# Patient Record
Sex: Male | Born: 1950 | Race: White | Hispanic: No | Marital: Single | Smoking: Current every day smoker
Health system: Southern US, Community
[De-identification: ages and names within clinical notes are randomized; demographics above are authoritative.]

## PROBLEM LIST (undated history)

## (undated) DIAGNOSIS — K219 Gastro-esophageal reflux disease without esophagitis: Secondary | ICD-10-CM

## (undated) DIAGNOSIS — S329XXA Fracture of unspecified parts of lumbosacral spine and pelvis, initial encounter for closed fracture: Secondary | ICD-10-CM

## (undated) DIAGNOSIS — F419 Anxiety disorder, unspecified: Secondary | ICD-10-CM

## (undated) DIAGNOSIS — F209 Schizophrenia, unspecified: Secondary | ICD-10-CM

## (undated) DIAGNOSIS — R338 Other retention of urine: Secondary | ICD-10-CM

## (undated) DIAGNOSIS — S0990XA Unspecified injury of head, initial encounter: Secondary | ICD-10-CM

## (undated) DIAGNOSIS — E785 Hyperlipidemia, unspecified: Secondary | ICD-10-CM

## (undated) DIAGNOSIS — F259 Schizoaffective disorder, unspecified: Secondary | ICD-10-CM

## (undated) DIAGNOSIS — T884XXA Failed or difficult intubation, initial encounter: Secondary | ICD-10-CM

## (undated) DIAGNOSIS — D649 Anemia, unspecified: Secondary | ICD-10-CM

## (undated) DIAGNOSIS — N400 Enlarged prostate without lower urinary tract symptoms: Secondary | ICD-10-CM

## (undated) DIAGNOSIS — I10 Essential (primary) hypertension: Secondary | ICD-10-CM

## (undated) DIAGNOSIS — F319 Bipolar disorder, unspecified: Secondary | ICD-10-CM

## (undated) DIAGNOSIS — T4145XA Adverse effect of unspecified anesthetic, initial encounter: Secondary | ICD-10-CM

## (undated) HISTORY — DX: Schizoaffective disorder, unspecified: F25.9

## (undated) HISTORY — DX: Anxiety disorder, unspecified: F41.9

## (undated) HISTORY — PX: SKIN GRAFT: SHX250

## (undated) HISTORY — DX: Bipolar disorder, unspecified: F31.9

---

## 1973-08-03 DIAGNOSIS — S069X9A Unspecified intracranial injury with loss of consciousness of unspecified duration, initial encounter: Secondary | ICD-10-CM

## 1973-08-03 DIAGNOSIS — S0990XA Unspecified injury of head, initial encounter: Secondary | ICD-10-CM

## 1973-08-03 DIAGNOSIS — S069XAA Unspecified intracranial injury with loss of consciousness status unknown, initial encounter: Secondary | ICD-10-CM

## 1973-08-03 HISTORY — PX: MANDIBLE FRACTURE SURGERY: SHX706

## 1973-08-03 HISTORY — PX: COSMETIC SURGERY: SHX468

## 1973-08-03 HISTORY — DX: Unspecified intracranial injury with loss of consciousness status unknown, initial encounter: S06.9XAA

## 1973-08-03 HISTORY — DX: Unspecified injury of head, initial encounter: S09.90XA

## 1973-08-03 HISTORY — DX: Unspecified intracranial injury with loss of consciousness of unspecified duration, initial encounter: S06.9X9A

## 1997-11-11 ENCOUNTER — Inpatient Hospital Stay (HOSPITAL_COMMUNITY): Admission: AD | Admit: 1997-11-11 | Discharge: 1997-11-14 | Payer: Self-pay | Admitting: *Deleted

## 1997-11-15 ENCOUNTER — Encounter (HOSPITAL_COMMUNITY): Admission: RE | Admit: 1997-11-15 | Discharge: 1998-02-13 | Payer: Self-pay

## 2001-03-30 ENCOUNTER — Encounter: Payer: Self-pay | Admitting: Cardiology

## 2001-03-30 ENCOUNTER — Encounter: Admission: RE | Admit: 2001-03-30 | Discharge: 2001-03-30 | Payer: Self-pay | Admitting: Cardiology

## 2006-11-21 ENCOUNTER — Inpatient Hospital Stay (HOSPITAL_COMMUNITY): Admission: AD | Admit: 2006-11-21 | Discharge: 2006-11-26 | Payer: Self-pay | Admitting: *Deleted

## 2006-11-21 ENCOUNTER — Ambulatory Visit: Payer: Self-pay | Admitting: *Deleted

## 2006-12-05 ENCOUNTER — Emergency Department (HOSPITAL_COMMUNITY): Admission: EM | Admit: 2006-12-05 | Discharge: 2006-12-05 | Payer: Self-pay | Admitting: Emergency Medicine

## 2009-03-30 ENCOUNTER — Emergency Department (HOSPITAL_COMMUNITY): Admission: EM | Admit: 2009-03-30 | Discharge: 2009-03-30 | Payer: Self-pay | Admitting: Emergency Medicine

## 2010-11-08 LAB — DIFFERENTIAL
Basophils Absolute: 0.2 10*3/uL — ABNORMAL HIGH (ref 0.0–0.1)
Basophils Relative: 2 % — ABNORMAL HIGH (ref 0–1)
Eosinophils Absolute: 0.2 10*3/uL (ref 0.0–0.7)
Eosinophils Relative: 2 % (ref 0–5)
Lymphocytes Relative: 29 % (ref 12–46)
Lymphs Abs: 2.8 10*3/uL (ref 0.7–4.0)
Monocytes Absolute: 0.8 10*3/uL (ref 0.1–1.0)
Monocytes Relative: 8 % (ref 3–12)
Neutro Abs: 5.6 10*3/uL (ref 1.7–7.7)
Neutrophils Relative %: 59 % (ref 43–77)

## 2010-11-08 LAB — RAPID URINE DRUG SCREEN, HOSP PERFORMED
Amphetamines: NOT DETECTED
Barbiturates: NOT DETECTED
Benzodiazepines: NOT DETECTED
Cocaine: NOT DETECTED
Opiates: NOT DETECTED
Tetrahydrocannabinol: NOT DETECTED

## 2010-11-08 LAB — CBC
HCT: 52.3 % — ABNORMAL HIGH (ref 39.0–52.0)
Hemoglobin: 17.7 g/dL — ABNORMAL HIGH (ref 13.0–17.0)
MCHC: 33.9 g/dL (ref 30.0–36.0)
MCV: 95.9 fL (ref 78.0–100.0)
Platelets: 222 10*3/uL (ref 150–400)
RBC: 5.45 MIL/uL (ref 4.22–5.81)
RDW: 14.1 % (ref 11.5–15.5)
WBC: 9.6 10*3/uL (ref 4.0–10.5)

## 2010-11-08 LAB — BASIC METABOLIC PANEL
BUN: 11 mg/dL (ref 6–23)
CO2: 27 mEq/L (ref 19–32)
Calcium: 9.6 mg/dL (ref 8.4–10.5)
Chloride: 104 mEq/L (ref 96–112)
Creatinine, Ser: 0.76 mg/dL (ref 0.4–1.5)
GFR calc Af Amer: >60 mL/min (ref >60)
GFR calc non Af Amer: >60 mL/min (ref >60)
Glucose, Bld: 96 mg/dL (ref 70–99)
Potassium: 3.5 mEq/L (ref 3.5–5.1)
Sodium: 139 mEq/L (ref 135–145)

## 2010-11-08 LAB — ETHANOL: Alcohol, Ethyl (B): <5 mg/dL (ref 0–10)

## 2010-12-19 NOTE — Discharge Summary (Signed)
[name], [name]               ACCOUNT NO.:  [id number]   MEDICAL RECORD NO.:  [id number]          PATIENT TYPE:  IPS   LOCATION:  0403                          FACILITY:  [facility]   PHYSICIAN:  Anselm Jungling, MD  DATE OF BIRTH:  [birth year]   DATE OF ADMISSION:  11/21/2006  DATE OF DISCHARGE:  11/26/2006                               DISCHARGE SUMMARY   IDENTIFYING DATA AND REASON FOR ADMISSION:  The patient is a 60 year old  unmarried male, who was admitted due to exacerbation of longstanding  schizophrenia.  He had previously been in outpatient care, but by his  own admission, had discontinued his psychotropic medications about a  month prior to admission.   The patient apparently began to decompensate with paranoid ideation,  that led to delusions about a neighbor wanting to harm him.  Because of  this, he abandoned his apartment and checked in at a hotel.  While at  the hotel, he felt physically ill and came to [facility] emergency  department.  There, his mental status abnormalities were noted, and  inpatient psychiatric treatment was arranged.  Please refer to the  admission note for further details pertaining to the symptoms,  circumstances and history that led to his hospitalization.  He was given  an initial Axis I diagnosis of schizophrenia, chronic paranoid type,  acute exacerbation.   MEDICAL AND LABORATORY:  The patient was medically and physically  cleared in the emergency department prior to transfer to the psychiatric  service, where he was once again assessed by the psychiatric nurse  practitioner.  There were no acute medical issues.   HOSPITAL COURSE:  The patient was admitted to the adult inpatient  psychiatric service.  He presented as a tall, slender, but normally  developed and healthy-appearing adult male, who was initially quite  irritable, demanding, and disagreed with his hospitalization.  He was  initially evaluated by Dr. Milford Cage.  The  undersigned assumed care  of the patient on the third hospital day.  By this time, the patient had  been refusing routine doses of Depakote and Seroquel, the medications  that had comprised his previous psychotropic regimen.   The patient, after some discussion, allowed me to contact his attorney,  who is his Chartered certified accountant, [name].  I spoke with [name], who  indicated that he had been trying to persuade the patient to accept  medical and/or psychiatric care for some time.  He expressed relief that  the patient was in a situation where he could get some care for his  underlying disturbance.   [name] apparently communicated to the patient his support of the  patient being in our facility and receiving treatment, with this, the  patient began to take regular doses of Seroquel and Depakote.  With  this, his irritability and agitation diminished quickly, and he became  quite cooperative.   Towards the latter part of his stay, and discussion with the  undersigned, the patient was able to acknowledge that his thinking had  become irrational and somewhat paranoid prior to admission, and that  this was why he had abandoned his apartment.  He agreed that he needed  to continue on medication.   On the sixth hospital day the patient appeared appropriate for  discharge.  He was reasonably well compensated, and was not having any  further acute symptoms of psychosis.  He was tolerating his medications  and agreed to continue them on an outpatient basis.   AFTERCARE:  The patient was to follow up with an appointment with Dr.  Lang [name] on November 30, 2006 at the [facility].  The patient was  discharged to his attorney, [name], who was to work out a residential  arrangements for the patient, both short-term and longer term.   DISCHARGE MEDICATIONS:  Depakote ER 500 mg daily and Seroquel 100 mg  nightly.   DISCHARGE DIAGNOSES:  AXIS I:  Schizoaffective disorder, most recently   psychotic with paranoid features, resolving.  AXIS II:  Deferred.  AXIS III:  No acute or chronic illnesses.  AXIS IV:  Stressors severe.  AXIS V:  Global assessment of functioning on discharge 60.      Anselm Jungling, MD  Electronically Signed     SPB/MEDQ  D:  11/29/2006  T:  11/29/2006  Job:  [id number]

## 2013-08-25 ENCOUNTER — Other Ambulatory Visit: Payer: Self-pay

## 2013-08-25 ENCOUNTER — Encounter (HOSPITAL_COMMUNITY): Payer: Self-pay | Admitting: Emergency Medicine

## 2013-08-25 ENCOUNTER — Emergency Department (HOSPITAL_COMMUNITY)
Admission: EM | Admit: 2013-08-25 | Discharge: 2013-08-26 | Disposition: A | Discharge status: Home / Self Care | Payer: Medicare Other | Attending: Emergency Medicine | Admitting: Emergency Medicine

## 2013-08-25 DIAGNOSIS — R5383 Other fatigue: Secondary | ICD-10-CM

## 2013-08-25 DIAGNOSIS — R0602 Shortness of breath: Secondary | ICD-10-CM | POA: Insufficient documentation

## 2013-08-25 DIAGNOSIS — R079 Chest pain, unspecified: Secondary | ICD-10-CM

## 2013-08-25 DIAGNOSIS — F209 Schizophrenia, unspecified: Secondary | ICD-10-CM | POA: Insufficient documentation

## 2013-08-25 DIAGNOSIS — R0789 Other chest pain: Secondary | ICD-10-CM | POA: Insufficient documentation

## 2013-08-25 DIAGNOSIS — R5381 Other malaise: Secondary | ICD-10-CM | POA: Insufficient documentation

## 2013-08-25 HISTORY — DX: Schizophrenia, unspecified: F20.9

## 2013-08-25 LAB — CBC
HCT: 47.2 % (ref 39.0–52.0)
Hemoglobin: 16.3 g/dL (ref 13.0–17.0)
MCH: 32.1 pg (ref 26.0–34.0)
MCHC: 34.5 g/dL (ref 30.0–36.0)
MCV: 93.1 fL (ref 78.0–100.0)
Platelets: 243 10*3/uL (ref 150–400)
RBC: 5.07 MIL/uL (ref 4.22–5.81)
RDW: 14.3 % (ref 11.5–15.5)
WBC: 9.8 10*3/uL (ref 4.0–10.5)

## 2013-08-25 LAB — POCT I-STAT TROPONIN I: TROPONIN I, POC: 0 ng/mL (ref 0.00–0.08)

## 2013-08-25 NOTE — ED Notes (Addendum)
Pt. arrived with EMS from home reports chest discomfort " prickling / pressure"  " for 1 week , denies chest pain , no SOB , slight nausea.

## 2013-08-25 NOTE — ED Provider Notes (Signed)
CSN: [id number]     Arrival date & time 08/25/13  2318 History   First MD Initiated Contact with Patient 08/25/13 2343     Chief Complaint  Patient presents with  . Chest Pain   (Consider location/radiation/quality/duration/timing/severity/associated sxs/prior Treatment) HPI 63 yo schizoprhenic man with no PMH presents with complaints of feeling of "malaise", per the patient, in his head and chest.  He has had this problem for the past several days. He feels fine while sitting or laying  down. His sx seem to be present when he stands up.   He notes that he sometimes feels SOB with exertion and also at rest. However, he denies SOB at this time. His episodes of SOB seem to be self limited and last minutes. He has had two such episodes - both at night. He denies cough, fever. He is a 5cig/day smoker. Denies history of CAD or VTE.   Patient notes that he has been off all psychiatric meds for several months because he cannot afford $3 Medicaid copay.   However, he denies hallucinations, SI and HI>    Past Medical History  Diagnosis Date  . Schizophrenia    History reviewed. No pertinent past surgical history. No family history on file. History  Substance Use Topics  . Smoking status: Never Smoker   . Smokeless tobacco: Not on file  . Alcohol Use: No    Review of Systems Ten point review of symptoms performed and is negative with the exception of symptoms noted above.   Allergies  Review of patient's allergies indicates no known allergies.  Home Medications  No current outpatient prescriptions on file. BP 158/99  Pulse 93  Temp(Src) 97.7 F (36.5 C) (Oral)  Resp 18  SpO2 97% Physical Exam Gen: well developed and well nourished appearing, disheveled appearing Head: NCAT Eyes: PERL, EOMI Nose: no epistaixis or rhinorrhea Mouth/throat: mucosa is moist and pink Neck: supple, no stridor Lungs: CTA B, no wheezing, rhonchi or rales CV: RRR, no murmur, extremities appear well  perfused.  Abd: soft, notender, nondistended Back: no ttp, no cva ttp Skin: warm and dry Ext: normal to inspection, no dependent edema Neuro: CN ii-xii grossly intact, no focal deficits Psyche; normal affect,  calm and cooperative he does have appropriate insight.   ED Course  Procedures (including critical care time) Labs Review Results for orders placed during the hospital encounter of 08/25/13 (from the past 24 hour(s))  CBC     Status: None   Collection Time    08/25/13 11:30 PM      Result Value Range   WBC 9.8  4.0 - 10.5 K/uL   RBC 5.07  4.22 - 5.81 MIL/uL   Hemoglobin 16.3  13.0 - 17.0 g/dL   HCT 40.9  81.1 - 91.4 %   MCV 93.1  78.0 - 100.0 fL   MCH 32.1  26.0 - 34.0 pg   MCHC 34.5  30.0 - 36.0 g/dL   RDW 78.2  95.6 - 21.3 %   Platelets 243  150 - 400 K/uL  BASIC METABOLIC PANEL     Status: None   Collection Time    08/25/13 11:30 PM      Result Value Range   Sodium 141  137 - 147 mEq/L   Potassium 4.2  3.7 - 5.3 mEq/L   Chloride 100  96 - 112 mEq/L   CO2 27  19 - 32 mEq/L   Glucose, Bld 95  70 - 99 mg/dL  BUN 12  6 - 23 mg/dL   Creatinine, Ser 1.610.80  0.50 - 1.35 mg/dL   Calcium 9.7  8.4 - 09.610.5 mg/dL   GFR calc non Af Amer >90  >90 mL/min   GFR calc Af Amer >90  >90 mL/min  POCT I-STAT TROPONIN I     Status: None   Collection Time    08/25/13 11:40 PM      Result Value Range   Troponin i, poc 0.00  0.00 - 0.08 ng/mL   Comment 3            CXR: normal cardiac silloute, normal appearing mediastinum, no infiltrates, no acute process identified.   EKG: nsr, no acute ischemic changes, normal intervals, normal axis, normal qrs complex  MDM  DDX: ACS, pneumothorax, pneumonia, pericardial or pleural effusion, gastritis, GERD/PUD, musculoskeletal pain.   0050: Patient reassessed and is pain free. In light of 3 week history of sx, and atypical nature along with normal trop, EKG and CXR and benign exam, I feel the patient may be safely discharged with plan for  outpatient f/u. With regards to his mental health care, [facility] and I [facility] for re-eval.     Brandt LoosenJulie Escher Harr, MD 08/26/13 0100

## 2013-08-26 ENCOUNTER — Emergency Department (HOSPITAL_COMMUNITY): Payer: Medicare Other

## 2013-08-26 LAB — BASIC METABOLIC PANEL
BUN: 12 mg/dL (ref 6–23)
CO2: 27 meq/L (ref 19–32)
Calcium: 9.7 mg/dL (ref 8.4–10.5)
Chloride: 100 mEq/L (ref 96–112)
Creatinine, Ser: 0.8 mg/dL (ref 0.50–1.35)
GFR calc Af Amer: >90 mL/min (ref >90)
GFR calc non Af Amer: >90 mL/min (ref >90)
Glucose, Bld: 95 mg/dL (ref 70–99)
Potassium: 4.2 mEq/L (ref 3.7–5.3)
Sodium: 141 mEq/L (ref 137–147)

## 2013-08-26 NOTE — Discharge Instructions (Signed)
Chest Pain (Nonspecific) °It is often hard to give a specific diagnosis for the cause of chest pain. There is always a chance that your pain could be related to something serious, such as a heart attack or a blood clot in the lungs. You need to follow up with your caregiver for further evaluation. °CAUSES  °· Heartburn. °· Pneumonia or bronchitis. °· Anxiety or stress. °· Inflammation around your heart (pericarditis) or lung (pleuritis or pleurisy). °· A blood clot in the lung. °· A collapsed lung (pneumothorax). It can develop suddenly on its own (spontaneous pneumothorax) or from injury (trauma) to the chest. °· Shingles infection (herpes zoster virus). °The chest wall is composed of bones, muscles, and cartilage. Any of these can be the source of the pain. °· The bones can be bruised by injury. °· The muscles or cartilage can be strained by coughing or overwork. °· The cartilage can be affected by inflammation and become sore (costochondritis). °DIAGNOSIS  °Lab tests or other studies, such as X-rays, electrocardiography, stress testing, or cardiac imaging, may be needed to find the cause of your pain.  °TREATMENT  °· Treatment depends on what may be causing your chest pain. Treatment may include: °· Acid blockers for heartburn. °· Anti-inflammatory medicine. °· Pain medicine for inflammatory conditions. °· Antibiotics if an infection is present. °· You may be advised to change lifestyle habits. This includes stopping smoking and avoiding alcohol, caffeine, and chocolate. °· You may be advised to keep your head raised (elevated) when sleeping. This reduces the chance of acid going backward from your stomach into your esophagus. °· Most of the time, nonspecific chest pain will improve within 2 to 3 days with rest and mild pain medicine. °HOME CARE INSTRUCTIONS  °· If antibiotics were prescribed, take your antibiotics as directed. Finish them even if you start to feel better. °· For the next few days, avoid physical  activities that bring on chest pain. Continue physical activities as directed. °· Do not smoke. °· Avoid drinking alcohol. °· Only take over-the-counter or prescription medicine for pain, discomfort, or fever as directed by your caregiver. °· Follow your caregiver's suggestions for further testing if your chest pain does not go away. °· Keep any follow-up appointments you made. If you do not go to an appointment, you could develop lasting (chronic) problems with pain. If there is any problem keeping an appointment, you must call to reschedule. °SEEK MEDICAL CARE IF:  °· You think you are having problems from the medicine you are taking. Read your medicine instructions carefully. °· Your chest pain does not go away, even after treatment. °· You develop a rash with blisters on your chest. °SEEK IMMEDIATE MEDICAL CARE IF:  °· You have increased chest pain or pain that spreads to your arm, neck, jaw, back, or abdomen. °· You develop shortness of breath, an increasing cough, or you are coughing up blood. °· You have severe back or abdominal pain, feel nauseous, or vomit. °· You develop severe weakness, fainting, or chills. °· You have a fever. °THIS IS AN EMERGENCY. Do not wait to see if the pain will go away. Get medical help at once. Call your local emergency services (911 in [place] Do not drive yourself to the hospital. °MAKE SURE YOU:  °· Understand these instructions. °· Will watch your condition. °· Will get help right away if you are not doing well or get worse. °Document Released: 04/29/2005 Document Revised: 10/12/2011 Document Reviewed: 02/23/2008 °[employer]® Patient Information ©2014 [employer]   [employer].   PLEASE FOLLOW UP WITH YOUR MENTAL HEALTH PROVIDER AT [facility] ON Monday.  CALL FOR FIRST AVAILABLE APPT.

## 2013-08-29 ENCOUNTER — Emergency Department (HOSPITAL_COMMUNITY): Payer: Medicare Other

## 2013-08-29 ENCOUNTER — Inpatient Hospital Stay (HOSPITAL_COMMUNITY)
Admission: EM | Admit: 2013-08-29 | Discharge: 2013-09-05 | DRG: 958 | Disposition: A | Discharge status: Skilled Nursing Facility | Payer: Medicare Other | Attending: General Surgery | Admitting: General Surgery

## 2013-08-29 ENCOUNTER — Encounter (HOSPITAL_COMMUNITY): Payer: Self-pay | Admitting: Emergency Medicine

## 2013-08-29 DIAGNOSIS — S32302A Unspecified fracture of left ilium, initial encounter for closed fracture: Secondary | ICD-10-CM | POA: Diagnosis present

## 2013-08-29 DIAGNOSIS — F209 Schizophrenia, unspecified: Secondary | ICD-10-CM | POA: Diagnosis present

## 2013-08-29 DIAGNOSIS — S71109A Unspecified open wound, unspecified thigh, initial encounter: Secondary | ICD-10-CM | POA: Diagnosis present

## 2013-08-29 DIAGNOSIS — S32592A Other specified fracture of left pubis, initial encounter for closed fracture: Secondary | ICD-10-CM | POA: Diagnosis present

## 2013-08-29 DIAGNOSIS — S329XXA Fracture of unspecified parts of lumbosacral spine and pelvis, initial encounter for closed fracture: Secondary | ICD-10-CM | POA: Diagnosis present

## 2013-08-29 DIAGNOSIS — R338 Other retention of urine: Secondary | ICD-10-CM | POA: Diagnosis present

## 2013-08-29 DIAGNOSIS — S71009A Unspecified open wound, unspecified hip, initial encounter: Secondary | ICD-10-CM | POA: Diagnosis present

## 2013-08-29 DIAGNOSIS — S82409B Unspecified fracture of shaft of unspecified fibula, initial encounter for open fracture type I or II: Secondary | ICD-10-CM

## 2013-08-29 DIAGNOSIS — S0003XA Contusion of scalp, initial encounter: Secondary | ICD-10-CM | POA: Diagnosis present

## 2013-08-29 DIAGNOSIS — S1093XA Contusion of unspecified part of neck, initial encounter: Secondary | ICD-10-CM

## 2013-08-29 DIAGNOSIS — D62 Acute posthemorrhagic anemia: Secondary | ICD-10-CM | POA: Diagnosis present

## 2013-08-29 DIAGNOSIS — N289 Disorder of kidney and ureter, unspecified: Secondary | ICD-10-CM | POA: Diagnosis present

## 2013-08-29 DIAGNOSIS — T8859XA Other complications of anesthesia, initial encounter: Secondary | ICD-10-CM

## 2013-08-29 DIAGNOSIS — IMO0002 Reserved for concepts with insufficient information to code with codable children: Secondary | ICD-10-CM

## 2013-08-29 DIAGNOSIS — S0083XA Contusion of other part of head, initial encounter: Secondary | ICD-10-CM

## 2013-08-29 DIAGNOSIS — F1020 Alcohol dependence, uncomplicated: Secondary | ICD-10-CM | POA: Diagnosis present

## 2013-08-29 DIAGNOSIS — K5900 Constipation, unspecified: Secondary | ICD-10-CM | POA: Diagnosis not present

## 2013-08-29 DIAGNOSIS — R339 Retention of urine, unspecified: Secondary | ICD-10-CM | POA: Diagnosis not present

## 2013-08-29 DIAGNOSIS — F17200 Nicotine dependence, unspecified, uncomplicated: Secondary | ICD-10-CM | POA: Diagnosis present

## 2013-08-29 DIAGNOSIS — S71102A Unspecified open wound, left thigh, initial encounter: Secondary | ICD-10-CM | POA: Diagnosis present

## 2013-08-29 DIAGNOSIS — T07XXXA Unspecified multiple injuries, initial encounter: Secondary | ICD-10-CM

## 2013-08-29 DIAGNOSIS — S32309A Unspecified fracture of unspecified ilium, initial encounter for closed fracture: Secondary | ICD-10-CM | POA: Diagnosis present

## 2013-08-29 DIAGNOSIS — S82401B Unspecified fracture of shaft of right fibula, initial encounter for open fracture type I or II: Secondary | ICD-10-CM | POA: Diagnosis present

## 2013-08-29 DIAGNOSIS — S32509A Unspecified fracture of unspecified pubis, initial encounter for closed fracture: Principal | ICD-10-CM | POA: Diagnosis present

## 2013-08-29 DIAGNOSIS — S32512A Fracture of superior rim of left pubis, initial encounter for closed fracture: Secondary | ICD-10-CM | POA: Diagnosis present

## 2013-08-29 HISTORY — DX: Other complications of anesthesia, initial encounter: T88.59XA

## 2013-08-29 LAB — CBC
HCT: 45.8 % (ref 39.0–52.0)
Hemoglobin: 15.7 g/dL (ref 13.0–17.0)
MCH: 32.2 pg (ref 26.0–34.0)
MCHC: 34.3 g/dL (ref 30.0–36.0)
MCV: 93.9 fL (ref 78.0–100.0)
PLATELETS: 319 10*3/uL (ref 150–400)
RBC: 4.88 MIL/uL (ref 4.22–5.81)
RDW: 14 % (ref 11.5–15.5)
WBC: 11.2 10*3/uL — ABNORMAL HIGH (ref 4.0–10.5)

## 2013-08-29 LAB — COMPREHENSIVE METABOLIC PANEL
ALK PHOS: 124 U/L — AB (ref 39–117)
ALT: 30 U/L (ref 0–53)
AST: 29 U/L (ref 0–37)
Albumin: 3.6 g/dL (ref 3.5–5.2)
BUN: 15 mg/dL (ref 6–23)
CALCIUM: 9.1 mg/dL (ref 8.4–10.5)
CO2: 22 meq/L (ref 19–32)
Chloride: 101 mEq/L (ref 96–112)
Creatinine, Ser: 1 mg/dL (ref 0.50–1.35)
GFR, EST NON AFRICAN AMERICAN: 79 mL/min — AB (ref >90)
Glucose, Bld: 125 mg/dL — ABNORMAL HIGH (ref 70–99)
Potassium: 3.9 mEq/L (ref 3.7–5.3)
SODIUM: 143 meq/L (ref 137–147)
Total Bilirubin: 0.9 mg/dL (ref 0.3–1.2)
Total Protein: 7.5 g/dL (ref 6.0–8.3)

## 2013-08-29 LAB — POCT I-STAT, CHEM 8
BUN: 17 mg/dL (ref 6–23)
CHLORIDE: 102 meq/L (ref 96–112)
CREATININE: 1.1 mg/dL (ref 0.50–1.35)
Calcium, Ion: 1.17 mmol/L (ref 1.13–1.30)
Glucose, Bld: 124 mg/dL — ABNORMAL HIGH (ref 70–99)
HCT: 51 % (ref 39.0–52.0)
Hemoglobin: 17.3 g/dL — ABNORMAL HIGH (ref 13.0–17.0)
POTASSIUM: 3.5 meq/L — AB (ref 3.7–5.3)
Sodium: 141 mEq/L (ref 137–147)
TCO2: 26 mmol/L (ref 0–100)

## 2013-08-29 LAB — GLUCOSE, CAPILLARY: Glucose-Capillary: 98 mg/dL (ref 70–99)

## 2013-08-29 LAB — PROTIME-INR
INR: 0.92 (ref 0.00–1.49)
PROTHROMBIN TIME: 12.2 s (ref 11.6–15.2)

## 2013-08-29 LAB — CG4 I-STAT (LACTIC ACID): Lactic Acid, Venous: 5.34 mmol/L — ABNORMAL HIGH (ref 0.5–2.2)

## 2013-08-29 LAB — ETHANOL: Alcohol, Ethyl (B): <11 mg/dL (ref 0–11)

## 2013-08-29 MED ORDER — HYDROMORPHONE HCL PF 1 MG/ML IJ SOLN
1.0000 mg | Freq: Once | INTRAMUSCULAR | Status: AC
  Administered 2013-08-29: 1 mg via INTRAVENOUS
  Filled 2013-08-29: qty 1

## 2013-08-29 MED ORDER — METOCLOPRAMIDE HCL 5 MG/ML IJ SOLN
10.0000 mg | Freq: Once | INTRAMUSCULAR | Status: AC | PRN
  Filled 2013-08-29: qty 2

## 2013-08-29 MED ORDER — OXYCODONE HCL 5 MG/5ML PO SOLN: 5.0000 mg | Freq: Once | ORAL | Status: AC | PRN

## 2013-08-29 MED ORDER — ONDANSETRON HCL 4 MG PO TABS
4.0000 mg | ORAL_TABLET | Freq: Four times a day (QID) | ORAL | Status: DC | PRN
  Administered 2013-09-01: 4 mg via ORAL
  Filled 2013-08-29 (×2): qty 1

## 2013-08-29 MED ORDER — DOCUSATE SODIUM 100 MG PO CAPS
100.0000 mg | ORAL_CAPSULE | Freq: Two times a day (BID) | ORAL | Status: DC
  Administered 2013-08-30 – 2013-09-02 (×7): 100 mg via ORAL
  Filled 2013-08-29 (×12): qty 1

## 2013-08-29 MED ORDER — BISACODYL 10 MG RE SUPP
10.0000 mg | Freq: Every day | RECTAL | Status: DC | PRN
  Administered 2013-09-02: 10 mg via RECTAL
  Filled 2013-08-29: qty 1

## 2013-08-29 MED ORDER — HYDROMORPHONE HCL PF 1 MG/ML IJ SOLN
1.0000 mg | INTRAMUSCULAR | Status: DC | PRN
  Administered 2013-08-30 (×2): 1 mg via INTRAVENOUS
  Filled 2013-08-29 (×2): qty 1

## 2013-08-29 MED ORDER — OXYCODONE HCL 5 MG PO TABS: 5.0000 mg | ORAL_TABLET | Freq: Once | ORAL | Status: AC | PRN

## 2013-08-29 MED ORDER — SODIUM CHLORIDE 0.9 % IV SOLN
1000.0000 mL | Freq: Once | INTRAVENOUS | Status: AC
  Administered 2013-08-29: 1000 mL via INTRAVENOUS

## 2013-08-29 MED ORDER — IOHEXOL 300 MG/ML  SOLN
100.0000 mL | Freq: Once | INTRAMUSCULAR | Status: AC | PRN
  Administered 2013-08-29: 100 mL via INTRAVENOUS

## 2013-08-29 MED ORDER — SODIUM CHLORIDE 0.9 % IV SOLN
1000.0000 mL | INTRAVENOUS | Status: DC
  Administered 2013-08-29: 1000 mL via INTRAVENOUS
  Administered 2013-08-30: 01:00:00 via INTRAVENOUS

## 2013-08-29 MED ORDER — SODIUM CHLORIDE 0.9 % IV BOLUS (SEPSIS)
1000.0000 mL | Freq: Once | INTRAVENOUS | Status: AC
  Administered 2013-08-29: 1000 mL via INTRAVENOUS

## 2013-08-29 MED ORDER — PANTOPRAZOLE SODIUM 40 MG IV SOLR
40.0000 mg | Freq: Every day | INTRAVENOUS | Status: DC
  Filled 2013-08-29: qty 40

## 2013-08-29 MED ORDER — ONDANSETRON HCL 4 MG/2ML IJ SOLN
4.0000 mg | Freq: Four times a day (QID) | INTRAMUSCULAR | Status: DC | PRN
  Administered 2013-08-30 – 2013-09-01 (×2): 4 mg via INTRAVENOUS
  Filled 2013-08-29 (×2): qty 2

## 2013-08-29 MED ORDER — PANTOPRAZOLE SODIUM 40 MG PO TBEC: 40.0000 mg | DELAYED_RELEASE_TABLET | Freq: Every day | ORAL | Status: DC

## 2013-08-29 MED ORDER — HYDROMORPHONE HCL PF 1 MG/ML IJ SOLN: 0.2500 mg | INTRAMUSCULAR | Status: DC | PRN

## 2013-08-29 MED ORDER — FENTANYL CITRATE 0.05 MG/ML IJ SOLN
75.0000 ug | Freq: Once | INTRAMUSCULAR | Status: AC
  Administered 2013-08-29: 75 ug via INTRAVENOUS
  Filled 2013-08-29: qty 2

## 2013-08-29 MED ORDER — DEXTROSE 5 % IV SOLN
2.0000 g | Freq: Once | INTRAVENOUS | Status: AC
  Administered 2013-08-29: 2 g via INTRAVENOUS
  Filled 2013-08-29: qty 20

## 2013-08-29 MED ORDER — TETANUS-DIPHTH-ACELL PERTUSSIS 5-2.5-18.5 LF-MCG/0.5 IM SUSP
0.5000 mL | Freq: Once | INTRAMUSCULAR | Status: AC
  Administered 2013-08-29: 0.5 mL via INTRAMUSCULAR
  Filled 2013-08-29: qty 0.5
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
## 2013-08-29 NOTE — ED Notes (Signed)
Placed Betadine 4x4's on left leg wound and wrapped with gauze
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
## 2013-08-29 NOTE — ED Notes (Signed)
Pt was given Fentanyl en route to ed
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
## 2013-08-29 NOTE — ED Notes (Signed)
Prepare patient for transport to a new room assignment.

## 2013-08-29 NOTE — ED Notes (Signed)
See trauma doc
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
## 2013-08-29 NOTE — ED Notes (Signed)
Attempted to call report. Floor RN unable to accept report.  

## 2013-08-29 NOTE — ED Notes (Signed)
NOTIFIED DR. ZAVITZ IN PERSON OF PATIENTS CG4+ LACTIC ACID RESULTS ,@18 :00PM , 08/29/2013.

## 2013-08-29 NOTE — ED Notes (Signed)
Pt transported to x-ray and CT scan. 

## 2013-08-29 NOTE — Anesthesia Preprocedure Evaluation (Signed)
Anesthesia Evaluation  Patient identified by MRN, date of birth, ID band Patient awake    Reviewed: Allergy & Precautions, H&P , NPO status , Patient's Chart, lab work & pertinent test results, reviewed documented beta blocker date and time   Airway Mallampati: II TM Distance: >3 FB Neck ROM: full    Dental   Pulmonary Current Smoker,  breath sounds clear to auscultation        Cardiovascular negative cardio ROS  Rhythm:regular     Neuro/Psych PSYCHIATRIC DISORDERS Schizophrenia negative neurological ROS     GI/Hepatic negative GI ROS, Neg liver ROS,   Endo/Other  negative endocrine ROS  Renal/GU negative Renal ROS  negative genitourinary   Musculoskeletal   Abdominal   Peds  Hematology negative hematology ROS (+)   Anesthesia Other Findings See surgeon's H&P   Reproductive/Obstetrics negative OB ROS                           Anesthesia Physical Anesthesia Plan  ASA: III and emergent  Anesthesia Plan: General   Post-op Pain Management:    Induction: Intravenous, Rapid sequence and Cricoid pressure planned  Airway Management Planned: Oral ETT  Additional Equipment:   Intra-op Plan:   Post-operative Plan: Extubation in OR  Informed Consent: I have reviewed the patients History and Physical, chart, labs and discussed the procedure including the risks, benefits and alternatives for the proposed anesthesia with the patient or authorized representative who has indicated his/her understanding and acceptance.   Dental Advisory Given  Plan Discussed with: CRNA and Surgeon  Anesthesia Plan Comments:         Anesthesia Quick Evaluation
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
## 2013-08-30 ENCOUNTER — Encounter (HOSPITAL_COMMUNITY): Payer: Medicare Other | Admitting: Anesthesiology

## 2013-08-30 ENCOUNTER — Encounter (HOSPITAL_COMMUNITY): Admission: EM | Disposition: A | Discharge status: Skilled Nursing Facility | Payer: Self-pay | Source: Home / Self Care

## 2013-08-30 ENCOUNTER — Inpatient Hospital Stay (HOSPITAL_COMMUNITY): Payer: Medicare Other

## 2013-08-30 ENCOUNTER — Inpatient Hospital Stay (HOSPITAL_COMMUNITY): Payer: Medicare Other | Admitting: Anesthesiology

## 2013-08-30 DIAGNOSIS — S82401B Unspecified fracture of shaft of right fibula, initial encounter for open fracture type I or II: Secondary | ICD-10-CM | POA: Diagnosis present

## 2013-08-30 DIAGNOSIS — F1020 Alcohol dependence, uncomplicated: Secondary | ICD-10-CM

## 2013-08-30 DIAGNOSIS — S32592A Other specified fracture of left pubis, initial encounter for closed fracture: Secondary | ICD-10-CM | POA: Diagnosis present

## 2013-08-30 DIAGNOSIS — D62 Acute posthemorrhagic anemia: Secondary | ICD-10-CM | POA: Diagnosis not present

## 2013-08-30 DIAGNOSIS — S32302A Unspecified fracture of left ilium, initial encounter for closed fracture: Secondary | ICD-10-CM | POA: Diagnosis present

## 2013-08-30 DIAGNOSIS — S71102A Unspecified open wound, left thigh, initial encounter: Secondary | ICD-10-CM | POA: Diagnosis present

## 2013-08-30 DIAGNOSIS — F209 Schizophrenia, unspecified: Secondary | ICD-10-CM | POA: Insufficient documentation

## 2013-08-30 DIAGNOSIS — S82409B Unspecified fracture of shaft of unspecified fibula, initial encounter for open fracture type I or II: Secondary | ICD-10-CM | POA: Diagnosis not present

## 2013-08-30 DIAGNOSIS — R338 Other retention of urine: Secondary | ICD-10-CM | POA: Diagnosis present

## 2013-08-30 DIAGNOSIS — S32512A Fracture of superior rim of left pubis, initial encounter for closed fracture: Secondary | ICD-10-CM | POA: Diagnosis present

## 2013-08-30 DIAGNOSIS — N2889 Other specified disorders of kidney and ureter: Secondary | ICD-10-CM | POA: Insufficient documentation

## 2013-08-30 DIAGNOSIS — S32509A Unspecified fracture of unspecified pubis, initial encounter for closed fracture: Secondary | ICD-10-CM | POA: Diagnosis not present

## 2013-08-30 HISTORY — PX: I&D EXTREMITY: SHX5045

## 2013-08-30 LAB — CBC
HCT: 35 % — ABNORMAL LOW (ref 39.0–52.0)
Hemoglobin: 11.6 g/dL — ABNORMAL LOW (ref 13.0–17.0)
MCH: 31.4 pg (ref 26.0–34.0)
MCHC: 33.1 g/dL (ref 30.0–36.0)
MCV: 94.6 fL (ref 78.0–100.0)
PLATELETS: 224 10*3/uL (ref 150–400)
RBC: 3.7 MIL/uL — ABNORMAL LOW (ref 4.22–5.81)
RDW: 14.5 % (ref 11.5–15.5)
WBC: 11.2 10*3/uL — AB (ref 4.0–10.5)

## 2013-08-30 LAB — MRSA PCR SCREENING: MRSA by PCR: NEGATIVE

## 2013-08-30 LAB — GLUCOSE, CAPILLARY: GLUCOSE-CAPILLARY: 112 mg/dL — AB (ref 70–99)

## 2013-08-30 LAB — COMPREHENSIVE METABOLIC PANEL
ALBUMIN: 2.6 g/dL — AB (ref 3.5–5.2)
ALT: 25 U/L (ref 0–53)
AST: 37 U/L (ref 0–37)
Alkaline Phosphatase: 82 U/L (ref 39–117)
BUN: 15 mg/dL (ref 6–23)
CALCIUM: 7.6 mg/dL — AB (ref 8.4–10.5)
CO2: 20 meq/L (ref 19–32)
Chloride: 108 mEq/L (ref 96–112)
Creatinine, Ser: 0.92 mg/dL (ref 0.50–1.35)
GFR calc Af Amer: >90 mL/min (ref >90)
GFR, EST NON AFRICAN AMERICAN: 89 mL/min — AB (ref >90)
Glucose, Bld: 167 mg/dL — ABNORMAL HIGH (ref 70–99)
Potassium: 4.5 mEq/L (ref 3.7–5.3)
SODIUM: 143 meq/L (ref 137–147)
Total Bilirubin: 0.8 mg/dL (ref 0.3–1.2)
Total Protein: 5.5 g/dL — ABNORMAL LOW (ref 6.0–8.3)

## 2013-08-30 LAB — CK: Total CK: 1034 U/L — ABNORMAL HIGH (ref 7–232)

## 2013-08-30 LAB — SAMPLE TO BLOOD BANK

## 2013-08-30 LAB — PROTIME-INR
INR: 1.09 (ref 0.00–1.49)
Prothrombin Time: 13.9 seconds (ref 11.6–15.2)

## 2013-08-30 LAB — CDS SEROLOGY

## 2013-08-30 SURGERY — IRRIGATION AND DEBRIDEMENT EXTREMITY
Anesthesia: General | Site: Thigh | Laterality: Bilateral

## 2013-08-30 MED ORDER — FLUPHENAZINE HCL 5 MG PO TABS
5.0000 mg | ORAL_TABLET | Freq: Every day | ORAL | Status: DC
  Administered 2013-08-30 – 2013-09-04 (×6): 5 mg via ORAL
  Filled 2013-08-30 (×7): qty 1

## 2013-08-30 MED ORDER — CEFAZOLIN SODIUM-DEXTROSE 2-3 GM-% IV SOLR
2.0000 g | Freq: Four times a day (QID) | INTRAVENOUS | Status: DC
  Administered 2013-08-30 – 2013-08-31 (×5): 2 g via INTRAVENOUS
  Filled 2013-08-30 (×8): qty 50

## 2013-08-30 MED ORDER — SUCCINYLCHOLINE CHLORIDE 20 MG/ML IJ SOLN
INTRAMUSCULAR | Status: DC | PRN
  Administered 2013-08-30: 140 mg via INTRAVENOUS

## 2013-08-30 MED ORDER — PROPOFOL 10 MG/ML IV BOLUS
INTRAVENOUS | Status: DC | PRN
  Administered 2013-08-30: 200 mg via INTRAVENOUS

## 2013-08-30 MED ORDER — BETHANECHOL CHLORIDE 25 MG PO TABS
25.0000 mg | ORAL_TABLET | Freq: Four times a day (QID) | ORAL | Status: DC
  Administered 2013-08-30 – 2013-09-05 (×24): 25 mg via ORAL
  Filled 2013-08-30 (×29): qty 1

## 2013-08-30 MED ORDER — LACTATED RINGERS IV SOLN
INTRAVENOUS | Status: DC | PRN
  Administered 2013-08-30: 02:00:00 via INTRAVENOUS

## 2013-08-30 MED ORDER — SODIUM CHLORIDE 0.9 % IR SOLN
Status: DC | PRN
  Administered 2013-08-30 (×8): 1000 mL

## 2013-08-30 MED ORDER — ASPIRIN EC 325 MG PO TBEC
325.0000 mg | DELAYED_RELEASE_TABLET | Freq: Two times a day (BID) | ORAL | Status: DC
  Administered 2013-08-30 – 2013-09-04 (×11): 325 mg via ORAL
  Filled 2013-08-30 (×14): qty 1

## 2013-08-30 MED ORDER — MIDAZOLAM HCL 2 MG/2ML IJ SOLN
INTRAMUSCULAR | Status: AC
  Filled 2013-08-30: qty 2

## 2013-08-30 MED ORDER — SUCCINYLCHOLINE CHLORIDE 20 MG/ML IJ SOLN
INTRAMUSCULAR | Status: AC
  Filled 2013-08-30: qty 1

## 2013-08-30 MED ORDER — MIDAZOLAM HCL 5 MG/5ML IJ SOLN
INTRAMUSCULAR | Status: DC | PRN
  Administered 2013-08-30: 2 mg via INTRAVENOUS

## 2013-08-30 MED ORDER — GENTAMICIN IN SALINE 1.6-0.9 MG/ML-% IV SOLN
80.0000 mg | Freq: Three times a day (TID) | INTRAVENOUS | Status: DC
  Filled 2013-08-30 (×2): qty 50

## 2013-08-30 MED ORDER — TAMSULOSIN HCL 0.4 MG PO CAPS
0.8000 mg | ORAL_CAPSULE | Freq: Every day | ORAL | Status: DC
  Administered 2013-08-30 – 2013-09-05 (×6): 0.8 mg via ORAL
  Filled 2013-08-30 (×7): qty 2

## 2013-08-30 MED ORDER — BUSPIRONE HCL 15 MG PO TABS
15.0000 mg | ORAL_TABLET | Freq: Two times a day (BID) | ORAL | Status: DC
  Administered 2013-08-30 – 2013-09-05 (×12): 15 mg via ORAL
  Filled 2013-08-30 (×14): qty 1

## 2013-08-30 MED ORDER — LIDOCAINE HCL (CARDIAC) 20 MG/ML IV SOLN
INTRAVENOUS | Status: AC
  Filled 2013-08-30: qty 5

## 2013-08-30 MED ORDER — FENTANYL CITRATE 0.05 MG/ML IJ SOLN
INTRAMUSCULAR | Status: DC | PRN
  Administered 2013-08-30 (×2): 100 ug via INTRAVENOUS

## 2013-08-30 MED ORDER — CEFAZOLIN SODIUM-DEXTROSE 2-3 GM-% IV SOLR: 2.0000 g | Freq: Once | INTRAVENOUS | Status: DC

## 2013-08-30 MED ORDER — OXYCODONE-ACETAMINOPHEN 5-325 MG PO TABS: 1.0000 | ORAL_TABLET | ORAL | Status: DC | PRN

## 2013-08-30 MED ORDER — SODIUM CHLORIDE 0.9 % IV SOLN
10.0000 mg | INTRAVENOUS | Status: DC | PRN
  Administered 2013-08-30: 50 ug/min via INTRAVENOUS

## 2013-08-30 MED ORDER — FENTANYL CITRATE 0.05 MG/ML IJ SOLN
INTRAMUSCULAR | Status: AC
  Filled 2013-08-30: qty 5

## 2013-08-30 MED ORDER — PROPOFOL 10 MG/ML IV BOLUS
INTRAVENOUS | Status: AC
  Filled 2013-08-30: qty 20

## 2013-08-30 MED ORDER — CEFAZOLIN SODIUM-DEXTROSE 2-3 GM-% IV SOLR
INTRAVENOUS | Status: AC
  Administered 2013-08-30: 2 g via INTRAVENOUS
  Filled 2013-08-30: qty 50

## 2013-08-30 MED ORDER — DEXTROSE 5 % IV SOLN: 500.0000 mg | Freq: Four times a day (QID) | INTRAVENOUS | Status: DC | PRN

## 2013-08-30 MED ORDER — QUETIAPINE FUMARATE 200 MG PO TABS
200.0000 mg | ORAL_TABLET | Freq: Every day | ORAL | Status: DC
  Administered 2013-08-30 – 2013-09-04 (×6): 200 mg via ORAL
  Filled 2013-08-30 (×7): qty 1

## 2013-08-30 MED ORDER — OXYCODONE HCL 5 MG PO TABS
5.0000 mg | ORAL_TABLET | ORAL | Status: DC | PRN
  Administered 2013-08-31 – 2013-09-02 (×6): 5 mg via ORAL
  Administered 2013-09-02: 15 mg via ORAL
  Administered 2013-09-02: 5 mg via ORAL
  Administered 2013-09-03: 15 mg via ORAL
  Administered 2013-09-03: 5 mg via ORAL
  Administered 2013-09-03: 15 mg via ORAL
  Administered 2013-09-04: 10 mg via ORAL
  Administered 2013-09-04 (×2): 15 mg via ORAL
  Administered 2013-09-04 – 2013-09-05 (×2): 10 mg via ORAL
  Administered 2013-09-05: 15 mg via ORAL
  Administered 2013-09-05: 10 mg via ORAL
  Filled 2013-08-30: qty 3
  Filled 2013-08-30: qty 2
  Filled 2013-08-30 (×2): qty 3
  Filled 2013-08-30 (×3): qty 1
  Filled 2013-08-30: qty 2
  Filled 2013-08-30 (×2): qty 3
  Filled 2013-08-30 (×4): qty 1
  Filled 2013-08-30: qty 3
  Filled 2013-08-30 (×2): qty 2
  Filled 2013-08-30: qty 1
  Filled 2013-08-30: qty 3

## 2013-08-30 MED ORDER — HYDROMORPHONE HCL PF 1 MG/ML IJ SOLN
0.5000 mg | INTRAMUSCULAR | Status: DC | PRN
  Administered 2013-08-30 – 2013-08-31 (×3): 0.5 mg via INTRAVENOUS
  Filled 2013-08-30 (×3): qty 1

## 2013-08-30 MED ORDER — POLYETHYLENE GLYCOL 3350 17 G PO PACK
17.0000 g | PACK | Freq: Every day | ORAL | Status: DC
  Administered 2013-08-30 – 2013-09-02 (×2): 17 g via ORAL
  Filled 2013-08-30 (×4): qty 1

## 2013-08-30 MED ORDER — METHOCARBAMOL 500 MG PO TABS
500.0000 mg | ORAL_TABLET | Freq: Four times a day (QID) | ORAL | Status: DC | PRN
  Administered 2013-08-30 – 2013-09-04 (×10): 500 mg via ORAL
  Filled 2013-08-30 (×10): qty 1

## 2013-08-30 MED ORDER — GENTAMICIN IN SALINE 1-0.9 MG/ML-% IV SOLN
100.0000 mg | INTRAVENOUS | Status: AC
  Administered 2013-08-30: 100 mg via INTRAVENOUS
  Filled 2013-08-30: qty 100

## 2013-08-30 MED ORDER — ENOXAPARIN SODIUM 30 MG/0.3ML ~~LOC~~ SOLN
30.0000 mg | Freq: Two times a day (BID) | SUBCUTANEOUS | Status: DC
  Administered 2013-08-30 – 2013-09-05 (×12): 30 mg via SUBCUTANEOUS
  Filled 2013-08-30 (×15): qty 0.3

## 2013-08-30 MED ORDER — PNEUMOCOCCAL VAC POLYVALENT 25 MCG/0.5ML IJ INJ
0.5000 mL | INJECTION | INTRAMUSCULAR | Status: DC
  Filled 2013-08-30: qty 0.5

## 2013-08-30 MED ORDER — LIDOCAINE HCL (CARDIAC) 20 MG/ML IV SOLN
INTRAVENOUS | Status: DC | PRN
  Administered 2013-08-30: 100 mg via INTRAVENOUS

## 2013-08-30 MED ORDER — GENTAMICIN IN SALINE 1.6-0.9 MG/ML-% IV SOLN
80.0000 mg | Freq: Three times a day (TID) | INTRAVENOUS | Status: AC
  Administered 2013-08-30 – 2013-08-31 (×6): 80 mg via INTRAVENOUS
  Filled 2013-08-30 (×7): qty 50

## 2013-08-30 SURGICAL SUPPLY — 48 items
BANDAGE ELASTIC 4 VELCRO ST LF (GAUZE/BANDAGES/DRESSINGS) ×4 IMPLANT
BANDAGE ELASTIC 6 VELCRO ST LF (GAUZE/BANDAGES/DRESSINGS) ×4 IMPLANT
BANDAGE ESMARK 6X9 LF (GAUZE/BANDAGES/DRESSINGS) ×1 IMPLANT
BANDAGE GAUZE ELAST BULKY 4 IN (GAUZE/BANDAGES/DRESSINGS) ×4 IMPLANT
BNDG CMPR 9X6 STRL LF SNTH (GAUZE/BANDAGES/DRESSINGS) ×2
BNDG COHESIVE 6X5 TAN STRL LF (GAUZE/BANDAGES/DRESSINGS) ×3 IMPLANT
BNDG ESMARK 6X9 LF (GAUZE/BANDAGES/DRESSINGS) ×4
BNDG GAUZE ELAST 4 BULKY (GAUZE/BANDAGES/DRESSINGS) ×6 IMPLANT
CUFF TOURNIQUET SINGLE 24IN (TOURNIQUET CUFF) ×3 IMPLANT
CUFF TOURNIQUET SINGLE 34IN LL (TOURNIQUET CUFF) ×3 IMPLANT
DRAIN PENROSE 1/4X12 LTX STRL (WOUND CARE) ×3 IMPLANT
DRAIN PENROSE 1X12 LTX STRL (DRAIN) ×6 IMPLANT
DRAPE EXTREMITY BILATERAL (DRAPE) ×3 IMPLANT
DRAPE U-SHAPE 47X51 STRL (DRAPES) ×4 IMPLANT
DRSG PAD ABDOMINAL 8X10 ST (GAUZE/BANDAGES/DRESSINGS) ×16 IMPLANT
ELECT CAUTERY BLADE 6.4 (BLADE) ×3 IMPLANT
ELECT REM PT RETURN 9FT ADLT (ELECTROSURGICAL) ×4
ELECTRODE REM PT RTRN 9FT ADLT (ELECTROSURGICAL) ×1 IMPLANT
GAUZE XEROFORM 5X9 LF (GAUZE/BANDAGES/DRESSINGS) ×6 IMPLANT
GLOVE BIOGEL PI IND STRL 7.5 (GLOVE) ×1 IMPLANT
GLOVE BIOGEL PI IND STRL 8 (GLOVE) ×4 IMPLANT
GLOVE BIOGEL PI INDICATOR 7.5 (GLOVE) ×2
GLOVE BIOGEL PI INDICATOR 8 (GLOVE) ×4
GLOVE ECLIPSE 7.5 STRL STRAW (GLOVE) ×8 IMPLANT
GLOVE SURG SS PI 7.5 STRL IVOR (GLOVE) ×3 IMPLANT
GOWN PREVENTION PLUS XLARGE (GOWN DISPOSABLE) ×4 IMPLANT
GOWN SRG XL XLNG 56XLVL 4 (GOWN DISPOSABLE) ×2 IMPLANT
GOWN STRL NON-REIN LRG LVL3 (GOWN DISPOSABLE) ×8 IMPLANT
GOWN STRL NON-REIN XL XLG LVL4 (GOWN DISPOSABLE) ×4
HANDPIECE INTERPULSE COAX TIP (DISPOSABLE) ×4
KIT BASIN OR (CUSTOM PROCEDURE TRAY) ×4 IMPLANT
KIT ROOM TURNOVER OR (KITS) ×4 IMPLANT
MANIFOLD NEPTUNE II (INSTRUMENTS) ×4 IMPLANT
NS IRRIG 1000ML POUR BTL (IV SOLUTION) ×4 IMPLANT
PACK ORTHO EXTREMITY (CUSTOM PROCEDURE TRAY) ×4 IMPLANT
PAD ARMBOARD 7.5X6 YLW CONV (MISCELLANEOUS) ×8 IMPLANT
SET HNDPC FAN SPRY TIP SCT (DISPOSABLE) ×1 IMPLANT
SPONGE GAUZE 4X4 12PLY (GAUZE/BANDAGES/DRESSINGS) ×10 IMPLANT
SPONGE LAP 18X18 X RAY DECT (DISPOSABLE) ×7 IMPLANT
STOCKINETTE IMPERVIOUS 9X36 MD (GAUZE/BANDAGES/DRESSINGS) ×7 IMPLANT
SUT ETHILON 2 0 PSLX (SUTURE) ×3 IMPLANT
SUT PDS AB 0 CT 36 (SUTURE) ×12 IMPLANT
TOWEL OR 17X24 6PK STRL BLUE (TOWEL DISPOSABLE) ×4 IMPLANT
TOWEL OR 17X26 10 PK STRL BLUE (TOWEL DISPOSABLE) ×7 IMPLANT
TUBE CONNECTING 12'X1/4 (SUCTIONS) ×2
TUBE CONNECTING 12X1/4 (SUCTIONS) ×5 IMPLANT
UNDERPAD 30X30 INCONTINENT (UNDERPADS AND DIAPERS) ×7 IMPLANT
YANKAUER SUCT BULB TIP NO VENT (SUCTIONS) ×4 IMPLANT

## 2013-08-30 NOTE — Progress Notes (Signed)
Subjective: Day of Surgery Procedure(s) (LRB): IRRIGATION AND DEBRIDEMENT with closure Left Thigh wound, Irrigation and debridement Right Ankle  (Bilateral) Patient reports pain as 3 on 0-10 scale.  Appreciate Dr Ardeth PerfectHandys help with pelvic fracture.   Objective: Vital signs in last 24 hours: Temp:  [97.5 F (36.4 C)-98.8 F (37.1 C)] 98.8 F (37.1 C) (01/28 1200) Pulse Rate:  [94-111] 108 (01/28 1200) Resp:  [13-27] 19 (01/28 1200) BP: (99-150)/(34-89) 126/58 mmHg (01/28 1200) SpO2:  [90 %-99 %] 90 % (01/28 1200) Weight:  [90.946 kg (200 lb 8 oz)-92.9 kg (204 lb 12.9 oz)] 92.9 kg (204 lb 12.9 oz) (01/27 2300)  Intake/Output from previous day: 01/27 0701 - 01/28 0700 In: 4200 [I.V.:4100; IV Piggyback:100] Out: 0  Intake/Output this shift: Total I/O In: 785 [P.O.:360; I.V.:375; IV Piggyback:50] Out: 300 [Urine:300]   Recent Labs  08/29/13 1658 08/29/13 1743 08/30/13 0530  HGB 15.7 17.3* 11.6*    Recent Labs  08/29/13 1658 08/29/13 1743 08/30/13 0530  WBC 11.2*  --  11.2*  RBC 4.88  --  3.70*  HCT 45.8 51.0 35.0*  PLT 319  --  224    Recent Labs  08/29/13 1658 08/29/13 1743 08/30/13 0530  NA 143 141 143  K 3.9 3.5* 4.5  CL 101 102 108  CO2 22  --  20  BUN 15 17 15   CREATININE 1.00 1.10 0.92  GLUCOSE 125* 124* 167*  CALCIUM 9.1  --  7.6*    Recent Labs  08/29/13 1658 08/30/13 0530  INR 0.92 1.09   Right ankle: Dressing c/d. NV intact to toes.  Left thigh:  Dressing c/d. Left calf soft, nontender.    Assessment/Plan: Day of Surgery Procedure(s) (LRB): IRRIGATION AND DEBRIDEMENT with closure Left Thigh wound, Irrigation and debridement Right Ankle  (Bilateral) Plan: Continue IV abx due to open fracture of right ankle May WB as tolerated on R ankle Only needs to wear CAM walker when out of bed NWB on left per Dr Carola FrostHandy Will follow   Matthew FolksBETHUNE,Lachlyn Vanderstelt G 08/30/2013, 1:43 PM

## 2013-08-30 NOTE — Consult Note (Signed)
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 Capacity to make his own medical decisions and living arrangements Patient does not meet criteria for acute psychiatric hospitalization as he has no symptoms of psychosis at this time Patient may be referred to the outpatient psychiatric services at [facility] when he was medically cleared Appreciate psychiatric consultation and will sign off at this time  [facility] R. 08/30/2013, 1:04 PM

## 2013-08-30 NOTE — Progress Notes (Signed)
OT Cancellation Note  Patient Details Name: [name] MRN: [id number] DOB: [birth year]   Cancelled Treatment:    Reason Eval/Treat Not Completed: Other (comment) Pt scheduled for OR tomorrow. Please update activity orders as appropriate to begin OT. thanks [facility] CampusWARD,HILLARY Kellie Murrill, OTR/L  [phone] 08/30/2013 08/30/2013, 5:37 PM

## 2013-08-30 NOTE — Progress Notes (Signed)
Awaiting psych eval; unable to proceed with PT at this time; surgical stabilization pending of LC2 pelvic fracture. Have discussed with Dr.urke Janee Mornhompson of the trauma service in detail. Will revisit in am.  Risk of prolonged immobility secondary to delay, inability to follow WB restrictions at discharge and potential for poor follow up have been discussed specifically.  Myrene GalasMichael Kenadee Gates, MD [facility] [phone] (p)

## 2013-08-30 NOTE — Progress Notes (Signed)
Patient ID: [name] BeachMichael G Ellington, male   DOB: February 09, 1951, 63 y.o.   MRN: [id number] I am uncertain if he is competent to consent for further surgery. I have consulted psychiatry to address that and to address his schizophrenia. Violeta GelinasBurke Mirian Casco, MD, MPH, FACS Pager: [phone]
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
## 2013-08-30 NOTE — Progress Notes (Addendum)
Pt has not voided this admission. Bladder scanner reads 502 cc. Dr. Corliss Skainssuei notified. Order for intermittent straight cath requested. MD informed of plan to prevent foley catheters, if possible, in order to prevent CAUTIs and decrease foley days. New orders received to place foley, now. Will carry out orders and continue to monitor.

## 2013-08-30 NOTE — Progress Notes (Signed)
UR completed.  Pookela Sellin, RN BSN MHA CCM Trauma/Neuro ICU Case Manager [phone]  

## 2013-08-30 NOTE — Brief Op Note (Signed)
08/29/2013 - 08/30/2013  2:32 AM  PATIENT:  [name]  63 y.o. male  PRE-OPERATIVE DIAGNOSIS:  Bilateral leg wounds  POST-OPERATIVE DIAGNOSIS:  * No post-op diagnosis entered *  PROCEDURE:  Procedure(s): IRRIGATION AND DEBRIDEMENT with closure Left Thigh wound, Irrigation and debridement Right Ankle with Possible wound vac application (Bilateral) APPLICATION OF WOUND VAC (Right)  SURGEON:  Surgeon(s) and Role:    * Harvie JuniorJohn L Virdie Penning, MD - Primary  PHYSICIAN ASSISTANT:   ASSISTANTS: bethune   ANESTHESIA:   general  EBL:  Total I/O In: 3075 [I.V.:3075] Out: 0   BLOOD ADMINISTERED:none  DRAINS: (1 1in penroser leg wound and 1 1/4 in penrose l ankle wound ) Hemovact drain(s) in the -- with  Suction Open   LOCAL MEDICATIONS USED:  NONE  SPECIMEN:  No Specimen  DISPOSITION OF SPECIMEN:  N/A  COUNTS:  YES  TOURNIQUET:   Total Tourniquet Time Documented: Thigh (Left) - 29 minutes Total: Thigh (Left) - 29 minutes   DICTATION: .Other Dictation: Dictation Number [id number]  PLAN OF CARE: Admit to inpatient   PATIENT DISPOSITION:  PACU - hemodynamically stable.   Delay start of Pharmacological VTE agent (>24hrs) due to surgical blood loss or risk of bleeding: no

## 2013-08-30 NOTE — Progress Notes (Signed)
Orthopedic Tech Progress Note Patient Details:  [name] 1950/12/12 [id number]  Ortho Devices Type of Ortho Device: CAM walker Ortho Device/Splint Interventions: Application   [name], [name] 08/30/2013, 1:27 PM

## 2013-08-30 NOTE — Progress Notes (Signed)
Given psychiatric history and question of competency we have asked psychiatry to consult.    Freeman CaldronMichael J. Cassady Stanczak, PA-C Pager: [phone] General Trauma PA Pager: [phone]

## 2013-08-30 NOTE — Transfer of Care (Signed)
Immediate Anesthesia Transfer of Care Note  Patient: [name]  Procedure(s) Performed: Procedure(s): IRRIGATION AND DEBRIDEMENT with closure Left Thigh wound, Irrigation and debridement Right Ankle  (Bilateral)  Patient Location: PACU  Anesthesia Type:General  Level of Consciousness: sedated and responds to stimulation  Airway & Oxygen Therapy: Patient connected to nasal cannula oxygen  Post-op Assessment: Report given to PACU RN, Post -op Vital signs reviewed and stable and Patient moving all extremities X 4  Post vital signs: Reviewed and stable  Complications: No apparent anesthesia complications

## 2013-08-30 NOTE — Anesthesia Postprocedure Evaluation (Signed)
Anesthesia Post Note  Patient: [name]  Procedure(s) Performed: Procedure(s) (LRB): IRRIGATION AND DEBRIDEMENT with closure Left Thigh wound, Irrigation and debridement Right Ankle  (Bilateral)  Anesthesia type: General  Patient location: PACU  Post pain: Pain level controlled  Post assessment: Patient's Cardiovascular Status Stable  Last Vitals:  Filed Vitals:   08/30/13 0349  BP: 99/66  Pulse: 104  Temp: 36.8 C  Resp:     Post vital signs: Reviewed and stable  Level of consciousness: alert  Complications: No apparent anesthesia complications

## 2013-08-30 NOTE — Progress Notes (Signed)
PT Cancellation Note  Patient Details Name: [name] MRN: [id number] DOB: Nov 01, 1950   Cancelled Treatment:    Reason Eval/Treat Not Completed:  Awaiting clarification on bilat LE WBing status.  PT to eval as able.   Marcene BrawnChadwell, Akela Pocius Marie 08/30/2013, 8:31 AM

## 2013-08-30 NOTE — Progress Notes (Signed)
Ortho Trauma Service Addendum  Follow up xrays of pelvis look stable and without changes Will allow pt to work with PT to see if there are any changes in pts pain  WBAT R leg with CAM, TDWB L LEX Will keep pt posted for tomorrow for L SI screw Continue to be concerned that pt will be noncompliant with restrictions due to psych issues May need to delay surgery if pt deemed incompetent to consent for surgery.    Mearl LatinKeith W. Deaire Mcwhirter, PA-C [facility] [phone] (P) 08/30/2013 1:43 PM

## 2013-08-30 NOTE — Progress Notes (Signed)
PT Cancellation Note  Patient Details Name: [name] BeachMichael G [name] MRN: [id number] DOB: [birth year]   Cancelled Treatment:    Reason Eval/Treat Not Completed: Patient not medically ready. Pt planned for OR tomorrow. Pt with unstable L pelvic fx. Will await for pt to have surgery to complete PT eval.   Etienne Millward, Becky Saxshly Marie 08/30/2013, 3:16 PM

## 2013-08-30 NOTE — Progress Notes (Signed)
Patient ID: [name], male   DOB: 1950-10-28, 63 y.o.   MRN: [id number]   LOS: 1 day   Subjective: C/o right ankle pain but says pelvis and thigh have stopped hurting.   Objective: Vital signs in last 24 hours: Temp:  [97.5 F (36.4 C)-98.8 F (37.1 C)] 98.8 F (37.1 C) (01/28 0740) Pulse Rate:  [94-108] 104 (01/28 0349) Resp:  [13-27] 13 (01/28 0330) BP: (99-150)/(34-89) 99/66 mmHg (01/28 0349) SpO2:  [91 %-99 %] 96 % (01/28 0349) Weight:  [200 lb 8 oz (90.946 kg)-204 lb 12.9 oz (92.9 kg)] 204 lb 12.9 oz (92.9 kg) (01/27 2300)    Laboratory  CBC  Recent Labs  08/29/13 1658 08/29/13 1743 08/30/13 0530  WBC 11.2*  --  11.2*  HGB 15.7 17.3* 11.6*  HCT 45.8 51.0 35.0*  PLT 319  --  224   BMET  Recent Labs  08/29/13 1658 08/29/13 1743 08/30/13 0530  NA 143 141 143  K 3.9 3.5* 4.5  CL 101 102 108  CO2 22  --  20  GLUCOSE 125* 124* 167*  BUN 15 17 15   CREATININE 1.00 1.10 0.92  CALCIUM 9.1  --  7.6*    Physical Exam General appearance: alert and no distress Resp: clear to auscultation bilaterally Cardio: regular rate and rhythm GI: normal findings: bowel sounds normal and soft, non-tender Extremities: NVI   Assessment/Plan: PHBC Multiple pelvic fxs -- I believe Dr. Carola FrostHandy to evaluate today, will hold on PT until plan made Complex left thigh lac s/p repair Open right fibula fx s/p I&D ABLA -- Monitor Urinary retention -- Foley placed, will start urecholine/Flomax and attempt voiding trial tomorrow or Friday depending on ortho plan Schizophrenia -- Restart home meds Left renal mass -- Needs OP f/u with MR FEN -- Oral meds for pain, increase bowel regimen VTE -- Left SCD, start Lovenox Dispo -- Can transfer to floor    Freeman CaldronMichael J. Dion Parrow, PA-C Pager: [phone] General Trauma PA Pager: [phone]   08/30/2013
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
## 2013-08-30 NOTE — Anesthesia Procedure Notes (Signed)
Procedure Name: Intubation Date/Time: 08/30/2013 1:30 AM Performed by: Molli HazardGORDON, Areya Lemmerman M Pre-anesthesia Checklist: Patient identified, Emergency Drugs available, Suction available and Patient being monitored Patient Re-evaluated:Patient Re-evaluated prior to inductionOxygen Delivery Method: Circle system utilized Preoxygenation: Pre-oxygenation with 100% oxygen Intubation Type: IV induction, Rapid sequence and Cricoid Pressure applied Laryngoscope Size: Miller and 2 Grade View: Grade III Tube type: Oral Tube size: 8.0 mm Number of attempts: 1 Airway Equipment and Method: Stylet Placement Confirmation: ETT inserted through vocal cords under direct vision,  positive ETCO2 and breath sounds checked- equal and bilateral Secured at: 24 cm Tube secured with: Tape Dental Injury: Teeth and Oropharynx as per pre-operative assessment

## 2013-08-30 NOTE — Progress Notes (Signed)
Good pain control. Resting well. Await Dr. Magdalene PatriciaHandy's assessment. Patient examined and I agree with the assessment and plan  Violeta GelinasBurke Rodina Pinales, MD, MPH, FACS Pager: [phone]  08/30/2013 10:55 AM

## 2013-08-31 ENCOUNTER — Encounter (HOSPITAL_COMMUNITY): Admission: EM | Disposition: A | Discharge status: Skilled Nursing Facility | Payer: Self-pay | Source: Home / Self Care

## 2013-08-31 ENCOUNTER — Inpatient Hospital Stay (HOSPITAL_COMMUNITY): Payer: Medicare Other

## 2013-08-31 ENCOUNTER — Inpatient Hospital Stay (HOSPITAL_COMMUNITY): Payer: Medicare Other | Admitting: Anesthesiology

## 2013-08-31 ENCOUNTER — Encounter (HOSPITAL_COMMUNITY): Payer: Self-pay | Admitting: Orthopedic Surgery

## 2013-08-31 ENCOUNTER — Encounter (HOSPITAL_COMMUNITY): Payer: Medicare Other | Admitting: Anesthesiology

## 2013-08-31 DIAGNOSIS — F209 Schizophrenia, unspecified: Secondary | ICD-10-CM | POA: Diagnosis not present

## 2013-08-31 DIAGNOSIS — S82409B Unspecified fracture of shaft of unspecified fibula, initial encounter for open fracture type I or II: Secondary | ICD-10-CM | POA: Diagnosis not present

## 2013-08-31 DIAGNOSIS — S32509A Unspecified fracture of unspecified pubis, initial encounter for closed fracture: Secondary | ICD-10-CM | POA: Diagnosis not present

## 2013-08-31 DIAGNOSIS — D62 Acute posthemorrhagic anemia: Secondary | ICD-10-CM | POA: Diagnosis not present

## 2013-08-31 DIAGNOSIS — T884XXA Failed or difficult intubation, initial encounter: Secondary | ICD-10-CM

## 2013-08-31 HISTORY — PX: DRESSING CHANGE UNDER ANESTHESIA: SHX5237

## 2013-08-31 HISTORY — DX: Failed or difficult intubation, initial encounter: T88.4XXA

## 2013-08-31 HISTORY — PX: SACRO-ILIAC PINNING: SHX5050

## 2013-08-31 LAB — TYPE AND SCREEN
ABO/RH(D): A POS
Antibody Screen: NEGATIVE

## 2013-08-31 LAB — CBC
HCT: 29.9 % — ABNORMAL LOW (ref 39.0–52.0)
HEMOGLOBIN: 10 g/dL — AB (ref 13.0–17.0)
MCH: 31.3 pg (ref 26.0–34.0)
MCHC: 33.4 g/dL (ref 30.0–36.0)
MCV: 93.7 fL (ref 78.0–100.0)
PLATELETS: 160 10*3/uL (ref 150–400)
RBC: 3.19 MIL/uL — ABNORMAL LOW (ref 4.22–5.81)
RDW: 14.2 % (ref 11.5–15.5)
WBC: 8.8 10*3/uL (ref 4.0–10.5)

## 2013-08-31 LAB — ABO/RH: ABO/RH(D): A POS

## 2013-08-31 SURGERY — PINNING, SACROILIAC JOINT, PERCUTANEOUS
Anesthesia: General | Site: Thigh | Laterality: Left

## 2013-08-31 MED ORDER — FENTANYL CITRATE 0.05 MG/ML IJ SOLN
INTRAMUSCULAR | Status: DC | PRN
  Administered 2013-08-31 (×3): 50 ug via INTRAVENOUS
  Administered 2013-08-31: 100 ug via INTRAVENOUS

## 2013-08-31 MED ORDER — ROCURONIUM BROMIDE 100 MG/10ML IV SOLN
INTRAVENOUS | Status: DC | PRN
  Administered 2013-08-31: 50 mg via INTRAVENOUS

## 2013-08-31 MED ORDER — FENTANYL CITRATE 0.05 MG/ML IJ SOLN
INTRAMUSCULAR | Status: AC
  Filled 2013-08-31: qty 5

## 2013-08-31 MED ORDER — PHENYLEPHRINE HCL 10 MG/ML IJ SOLN
10.0000 mg | INTRAMUSCULAR | Status: DC | PRN
  Administered 2013-08-31: 25 ug/min via INTRAVENOUS

## 2013-08-31 MED ORDER — PROPOFOL 10 MG/ML IV BOLUS
INTRAVENOUS | Status: AC
  Filled 2013-08-31: qty 20

## 2013-08-31 MED ORDER — LACTATED RINGERS IV SOLN
INTRAVENOUS | Status: DC | PRN
  Administered 2013-08-31: 08:00:00 via INTRAVENOUS

## 2013-08-31 MED ORDER — ROCURONIUM BROMIDE 50 MG/5ML IV SOLN
INTRAVENOUS | Status: AC
  Filled 2013-08-31: qty 1

## 2013-08-31 MED ORDER — METHOCARBAMOL 500 MG PO TABS
ORAL_TABLET | ORAL | Status: AC
  Filled 2013-08-31: qty 1

## 2013-08-31 MED ORDER — NEOSTIGMINE METHYLSULFATE 1 MG/ML IJ SOLN
INTRAMUSCULAR | Status: AC
  Filled 2013-08-31: qty 10

## 2013-08-31 MED ORDER — HYDROMORPHONE HCL PF 1 MG/ML IJ SOLN
INTRAMUSCULAR | Status: AC
  Filled 2013-08-31: qty 1

## 2013-08-31 MED ORDER — OXYCODONE HCL 5 MG PO TABS
ORAL_TABLET | ORAL | Status: AC
  Filled 2013-08-31: qty 1

## 2013-08-31 MED ORDER — GLYCOPYRROLATE 0.2 MG/ML IJ SOLN
INTRAMUSCULAR | Status: DC | PRN
  Administered 2013-08-31: 0.6 mg via INTRAVENOUS

## 2013-08-31 MED ORDER — PHENYLEPHRINE HCL 10 MG/ML IJ SOLN
INTRAMUSCULAR | Status: DC | PRN
  Administered 2013-08-31 (×3): 40 ug via INTRAVENOUS

## 2013-08-31 MED ORDER — ALBUMIN HUMAN 5 % IV SOLN
INTRAVENOUS | Status: DC | PRN
  Administered 2013-08-31: 09:00:00 via INTRAVENOUS

## 2013-08-31 MED ORDER — SUCCINYLCHOLINE CHLORIDE 20 MG/ML IJ SOLN
INTRAMUSCULAR | Status: AC
  Filled 2013-08-31: qty 1

## 2013-08-31 MED ORDER — CEFAZOLIN SODIUM 1-5 GM-% IV SOLN
1.0000 g | Freq: Three times a day (TID) | INTRAVENOUS | Status: AC
  Administered 2013-08-31 – 2013-09-01 (×3): 1 g via INTRAVENOUS
  Filled 2013-08-31 (×3): qty 50

## 2013-08-31 MED ORDER — MIDAZOLAM HCL 2 MG/2ML IJ SOLN
INTRAMUSCULAR | Status: AC
  Filled 2013-08-31: qty 2

## 2013-08-31 MED ORDER — PHENYLEPHRINE 40 MCG/ML (10ML) SYRINGE FOR IV PUSH (FOR BLOOD PRESSURE SUPPORT)
PREFILLED_SYRINGE | INTRAVENOUS | Status: AC
  Filled 2013-08-31: qty 10

## 2013-08-31 MED ORDER — NEOSTIGMINE METHYLSULFATE 1 MG/ML IJ SOLN
INTRAMUSCULAR | Status: DC | PRN
  Administered 2013-08-31: 3 mg via INTRAVENOUS

## 2013-08-31 MED ORDER — GLYCOPYRROLATE 0.2 MG/ML IJ SOLN
INTRAMUSCULAR | Status: AC
  Filled 2013-08-31: qty 3

## 2013-08-31 MED ORDER — PROPOFOL 10 MG/ML IV BOLUS
INTRAVENOUS | Status: DC | PRN
  Administered 2013-08-31: 100 mg via INTRAVENOUS

## 2013-08-31 MED ORDER — LIDOCAINE HCL (CARDIAC) 20 MG/ML IV SOLN
INTRAVENOUS | Status: AC
  Filled 2013-08-31: qty 5

## 2013-08-31 MED ORDER — EPHEDRINE SULFATE 50 MG/ML IJ SOLN
INTRAMUSCULAR | Status: AC
  Filled 2013-08-31: qty 1

## 2013-08-31 MED ORDER — LIDOCAINE HCL (CARDIAC) 20 MG/ML IV SOLN
INTRAVENOUS | Status: DC | PRN
  Administered 2013-08-31: 70 mg via INTRAVENOUS

## 2013-08-31 SURGICAL SUPPLY — 55 items
BANDAGE ELASTIC 4 VELCRO ST LF (GAUZE/BANDAGES/DRESSINGS) ×4 IMPLANT
BANDAGE ELASTIC 6 VELCRO ST LF (GAUZE/BANDAGES/DRESSINGS) ×2 IMPLANT
BLADE SURG 15 STRL LF DISP TIS (BLADE) ×2 IMPLANT
BLADE SURG 15 STRL SS (BLADE) ×4
BNDG COHESIVE 6X5 TAN STRL LF (GAUZE/BANDAGES/DRESSINGS) IMPLANT
BRUSH SCRUB DISP (MISCELLANEOUS) ×10 IMPLANT
CLOTH BEACON ORANGE TIMEOUT ST (SAFETY) ×2 IMPLANT
COVER SURGICAL LIGHT HANDLE (MISCELLANEOUS) ×6 IMPLANT
DRAPE C-ARM 42X72 X-RAY (DRAPES) ×2 IMPLANT
DRAPE C-ARMOR (DRAPES) ×4 IMPLANT
DRAPE INCISE IOBAN 66X45 STRL (DRAPES) ×4 IMPLANT
DRAPE LAPAROTOMY TRNSV 102X78 (DRAPE) ×4 IMPLANT
DRAPE ORTHO SPLIT 77X108 STRL (DRAPES)
DRAPE SURG ORHT 6 SPLT 77X108 (DRAPES) IMPLANT
DRAPE U-SHAPE 47X51 STRL (DRAPES) ×2 IMPLANT
DRSG MEPILEX BORDER 4X4 (GAUZE/BANDAGES/DRESSINGS) ×2 IMPLANT
DRSG MEPITEL 4X7.2 (GAUZE/BANDAGES/DRESSINGS) ×4 IMPLANT
DRSG PAD ABDOMINAL 8X10 ST (GAUZE/BANDAGES/DRESSINGS) ×4 IMPLANT
DRSG VAC ATS MED SENSATRAC (GAUZE/BANDAGES/DRESSINGS) ×2 IMPLANT
DRSG VAC ATS SM SENSATRAC (GAUZE/BANDAGES/DRESSINGS) ×2 IMPLANT
ELECT REM PT RETURN 9FT ADLT (ELECTROSURGICAL) ×4
ELECTRODE REM PT RTRN 9FT ADLT (ELECTROSURGICAL) ×2 IMPLANT
GAUZE XEROFORM 5X9 LF (GAUZE/BANDAGES/DRESSINGS) ×2 IMPLANT
GLOVE BIO SURGEON STRL SZ7.5 (GLOVE) ×2 IMPLANT
GLOVE BIO SURGEON STRL SZ8 (GLOVE) ×4 IMPLANT
GLOVE BIOGEL PI IND STRL 6.5 (GLOVE) IMPLANT
GLOVE BIOGEL PI IND STRL 7.5 (GLOVE) ×2 IMPLANT
GLOVE BIOGEL PI IND STRL 8 (GLOVE) ×2 IMPLANT
GLOVE BIOGEL PI INDICATOR 6.5 (GLOVE) ×6
GLOVE BIOGEL PI INDICATOR 7.5 (GLOVE) ×6
GLOVE BIOGEL PI INDICATOR 8 (GLOVE) ×2
GOWN STRL REUS W/ TWL LRG LVL3 (GOWN DISPOSABLE) ×4 IMPLANT
GOWN STRL REUS W/ TWL XL LVL3 (GOWN DISPOSABLE) ×2 IMPLANT
GOWN STRL REUS W/TWL LRG LVL3 (GOWN DISPOSABLE) ×12
GOWN STRL REUS W/TWL XL LVL3 (GOWN DISPOSABLE) ×4
GOWN STRL REUS W/TWL XL LVL4 (GOWN DISPOSABLE) ×2 IMPLANT
GUIDEPIN 2.8X300 THRD TIP OIC (Screw) ×4 IMPLANT
KIT BASIN OR (CUSTOM PROCEDURE TRAY) ×4 IMPLANT
KIT ROOM TURNOVER OR (KITS) ×4 IMPLANT
MANIFOLD NEPTUNE II (INSTRUMENTS) ×2 IMPLANT
NS IRRIG 1000ML POUR BTL (IV SOLUTION) ×4 IMPLANT
PACK GENERAL/GYN (CUSTOM PROCEDURE TRAY) ×2 IMPLANT
PACK ORTHO EXTREMITY (CUSTOM PROCEDURE TRAY) ×2 IMPLANT
PAD ARMBOARD 7.5X6 YLW CONV (MISCELLANEOUS) ×8 IMPLANT
SCREW CANN 7.3X105 32MM THRD (Screw) ×2 IMPLANT
SCREW CANN 7.3X90 32MM THRD (Screw) ×2 IMPLANT
SPONGE GAUZE 4X4 12PLY (GAUZE/BANDAGES/DRESSINGS) ×10 IMPLANT
STAPLER VISISTAT 35W (STAPLE) ×4 IMPLANT
STOCKINETTE IMPERVIOUS LG (DRAPES) IMPLANT
SUT ETHILON 3 0 PS 1 (SUTURE) ×4 IMPLANT
SUT VIC AB 2-0 FS1 27 (SUTURE) ×4 IMPLANT
TOWEL OR 17X24 6PK STRL BLUE (TOWEL DISPOSABLE) ×6 IMPLANT
TOWEL OR 17X26 10 PK STRL BLUE (TOWEL DISPOSABLE) ×10 IMPLANT
UNDERPAD 30X30 INCONTINENT (UNDERPADS AND DIAPERS) ×10 IMPLANT
WATER STERILE IRR 1000ML POUR (IV SOLUTION) ×2 IMPLANT

## 2013-08-31 NOTE — Brief Op Note (Signed)
08/29/2013 - 08/31/2013  10:10 AM  PATIENT:  [name]  63 y.o. male  PRE-OPERATIVE DIAGNOSIS:   1. LC2 Pelvic Ring Fractures 2. Left thigh dressing degloving wound 3. Right ankle dressing degloving wound  POST-OPERATIVE DIAGNOSIS:  1. LC2 Pelvic Ring Fractures 2. Left thigh dressing degloving wound 3. Right ankle dressing degloving wound  PROCEDURE:  Procedure(s): 1. LEFT SACRO-ILIAC PINNING (Left) x 2 2. DRESSING CHANGE UNDER ANESTHESIA for right ankle and left thigh (Left) 3. Application of wound vac, left thigh 4. Application of wound vac, right ankle  SURGEON:  Surgeon(s) and Role:    * Budd PalmerMichael H Decie Verne, MD - Primary  PHYSICIAN ASSISTANT: None  ANESTHESIA:   general  I/O:  Total I/O In: 1050 [I.V.:800; IV Piggyback:250] Out: 350 [Urine:350]  SPECIMEN:  No Specimen  TOURNIQUET:  * No tourniquets in log *  DICTATION: .Other Dictation: Dictation Number [id number]

## 2013-08-31 NOTE — Progress Notes (Signed)
Patient ID: [name] BeachMichael G [name], male   DOB: 1951/01/03, 63 y.o.   MRN: [id number] Day of Surgery  Subjective: Just back from PACU, says he can move his legs a lot easier  Objective: Vital signs in last 24 hours: Temp:  [97.4 F (36.3 C)-100.6 F (38.1 C)] 97.8 F (36.6 C) (01/29 1245) Pulse Rate:  [104-137] 124 (01/29 1200) Resp:  [15-24] 17 (01/29 1200) BP: (97-123)/(40-72) 110/64 mmHg (01/29 1200) SpO2:  [94 %-100 %] 95 % (01/29 1200)    Intake/Output from previous day: 01/28 0701 - 01/29 0700 In: 1275 [P.O.:600; I.V.:375; IV Piggyback:300] Out: 1125 [Urine:1125] Intake/Output this shift: Total I/O In: 1050 [I.V.:800; IV Piggyback:250] Out: 350 [Urine:350]  General appearance: alert and cooperative Resp: clear to auscultation bilaterally Cardio: regular rate and rhythm GI: soft, NT, ND Extremities: BLE ortho dressings, VAC on RLE Neuro: alert and F/C well  Lab Results: CBC   Recent Labs  08/30/13 0530 08/31/13 0245  WBC 11.2* 8.8  HGB 11.6* 10.0*  HCT 35.0* 29.9*  PLT 224 160   BMET  Recent Labs  08/29/13 1658 08/29/13 1743 08/30/13 0530  NA 143 141 143  K 3.9 3.5* 4.5  CL 101 102 108  CO2 22  --  20  GLUCOSE 125* 124* 167*  BUN 15 17 15   CREATININE 1.00 1.10 0.92  CALCIUM 9.1  --  7.6*   PT/INR  Recent Labs  08/29/13 1658 08/30/13 0530  LABPROT 12.2 13.9  INR 0.92 1.09  Anti-infectives: Anti-infectives   Start     Dose/Rate Route Frequency Ordered Stop   08/31/13 0800  ceFAZolin (ANCEF) IVPB 2 g/50 mL premix  Status:  Discontinued     2 g 100 mL/hr over 30 Minutes Intravenous  Once 08/30/13 0932 08/30/13 0933   08/30/13 0600  ceFAZolin (ANCEF) IVPB 2 g/50 mL premix     2 g 100 mL/hr over 30 Minutes Intravenous Every 6 hours 08/30/13 0446 09/01/13 0559   08/30/13 0600  gentamicin (GARAMYCIN) IVPB 80 mg     80 mg 100 mL/hr over 30 Minutes Intravenous Every 8 hours 08/30/13 0454 09/01/13 0559   08/30/13 0500  gentamicin (GARAMYCIN) IVPB 80  mg  Status:  Discontinued     80 mg 100 mL/hr over 30 Minutes Intravenous Every 8 hours 08/30/13 0446 08/30/13 0454   08/30/13 0115  gentamicin (GARAMYCIN) IVPB 100 mg     100 mg 200 mL/hr over 30 Minutes Intravenous To Surgery 08/30/13 0113 08/30/13 0208   08/30/13 0114  ceFAZolin (ANCEF) 2-3 GM-% IVPB SOLR    Comments:  Toney SangGordon, Theresa   : cabinet override      08/30/13 0114 08/30/13 0138   08/29/13 1715  ceFAZolin (ANCEF) 2 g in dextrose 5 % 50 mL IVPB     2 g 140 mL/hr over 30 Minutes Intravenous  Once 08/29/13 1715 08/29/13 1916      Assessment/Plan: PHBC Multiple pelvic fxs -- S/P sacral screw and BLE wound I&D by Dr. Carola FrostHandy today Complex left thigh lac s/p repair Open right fibula fx s/p I&D ABLA -- Monitor Urinary retention -- Foley placed, will start urecholine/Flomax and attempt voiding trial tomorrow or Friday depending on ortho plan Schizophrenia -- on home meds, appreciate psych input Left renal mass -- Needs OP f/u with MR FEN -- reg diet, Oral meds for pain VTE -- Left SCD, re-start Lovenox DIspo - PT/OT  LOS: 2 days    Violeta GelinasBurke Tamaj Jurgens, MD, MPH, FACS Pager: [phone]  08/31/2013

## 2013-08-31 NOTE — Progress Notes (Addendum)
Patient discussed with Trauma Team during rounds today. Patient will require SNF placement for rehab.  Awaiting PT/OTevals.  Fl2 initiated and will need to complete once evals are in place and then fax out.  Pasarr initiated; needs 30 day return home note due to diagnosis of Schizophrenia and current mental health follow up.Will place 30 day MD return home note on chart in the a.m.  CSW will follow and assist with SNF placement when medically stable.  Lorri Frederickonna T. West PughCrowder, LCSWA  [phone]

## 2013-08-31 NOTE — Anesthesia Procedure Notes (Signed)
Procedure Name: Intubation Date/Time: 08/31/2013 8:27 AM Performed by: Whitman HeroVITSHTEYN, Hatim Homann Pre-anesthesia Checklist: Patient identified, Emergency Drugs available, Suction available and Patient being monitored Patient Re-evaluated:Patient Re-evaluated prior to inductionOxygen Delivery Method: Circle system utilized Preoxygenation: Pre-oxygenation with 100% oxygen Intubation Type: IV induction Ventilation: Mask ventilation with difficulty, Two handed mask ventilation required and Oral airway inserted - appropriate to patient size Laryngoscope Size: Mac and 3 Grade View: Grade IV Tube type: Oral Tube size: 7.5 mm Number of attempts: 2 Airway Equipment and Method: Stylet and Video-laryngoscopy Placement Confirmation: ETT inserted through vocal cords under direct vision,  breath sounds checked- equal and bilateral and positive ETCO2 Secured at: 22 cm Tube secured with: Tape Dental Injury: Teeth and Oropharynx as per pre-operative assessment  Difficulty Due To: Difficulty was anticipated, Difficult Airway- due to anterior larynx, Difficult Airway- due to limited oral opening and Difficult Airway- due to large tongue Future Recommendations: Recommend- induction with short-acting agent, and alternative techniques readily available Comments: Attempted DL. MP4 -  unable to visualize. Glidescope w/ good view.

## 2013-08-31 NOTE — Consult Note (Signed)
Please refer to my other notes.  I have seen and examined the patient. I agree with the findings above.  [name],Ermine H, MD 08/31/2013 7:51 AM

## 2013-08-31 NOTE — Transfer of Care (Signed)
Immediate Anesthesia Transfer of Care Note  Patient: [name]  Procedure(s) Performed: Procedure(s): LEFT SACRO-ILIAC PINNING (Left) DRESSING CHANGE UNDER ANESTHESIA for right ankle and left thigh (Left)  Patient Location: PACU  Anesthesia Type:General  Level of Consciousness: awake, alert  and oriented  Airway & Oxygen Therapy: Patient Spontanous Breathing and Patient connected to nasal cannula oxygen  Post-op Assessment: Report given to PACU RN and Post -op Vital signs reviewed and stable  Post vital signs: Reviewed and stable  Complications: No apparent anesthesia complications

## 2013-08-31 NOTE — Progress Notes (Addendum)
08/31/2013 4:31 PM Patient's HR is in the 130's.  Pain increased about 2 hours after patients returned from PACU.  Notified Dr. Janee Mornhompson.  He feels that it is okay for patient to a ahead an transfer to [facility]. Jaclyn ShaggyStanfield, Cassandra Mcmanaman Everhart

## 2013-08-31 NOTE — Anesthesia Preprocedure Evaluation (Addendum)
Anesthesia Evaluation  Patient identified by MRN, date of birth, ID band Patient awake    Reviewed: Allergy & Precautions, H&P , NPO status , Patient's Chart, lab work & pertinent test results, reviewed documented beta blocker date and time   Airway Mallampati: IV TM Distance: >3 FB Neck ROM: Full  Mouth opening: Limited Mouth Opening  Dental  (+) Teeth Intact   Pulmonary Current Smoker,    Pulmonary exam normal       Cardiovascular - anginaRhythm:Regular Rate:Normal     Neuro/Psych PSYCHIATRIC DISORDERS Schizophrenia Schizophrenia     GI/Hepatic negative GI ROS, Neg liver ROS,   Endo/Other  negative endocrine ROS  Renal/GU negative Renal ROS  negative genitourinary   Musculoskeletal   Abdominal   Peds  Hematology  (+) anemia ,   Anesthesia Other Findings   Reproductive/Obstetrics                          Anesthesia Physical Anesthesia Plan  ASA: II  Anesthesia Plan: General   Post-op Pain Management:    Induction: Intravenous  Airway Management Planned: Oral ETT  Additional Equipment: None  Intra-op Plan:   Post-operative Plan: Extubation in OR  Informed Consent: I have reviewed the patients History and Physical, chart, labs and discussed the procedure including the risks, benefits and alternatives for the proposed anesthesia with the patient or authorized representative who has indicated his/her understanding and acceptance.   Dental advisory given  Plan Discussed with: CRNA and Surgeon  Anesthesia Plan Comments:        Anesthesia Quick Evaluation

## 2013-08-31 NOTE — Progress Notes (Signed)
[name] Bauernfeind, MD, MPH, FACS Pager: [phone]  

## 2013-08-31 NOTE — Progress Notes (Signed)
PT Cancellation Note  Patient Details Name: [name] MRN: [id number] DOB: Sep 06, 1950   Cancelled Treatment:     Pt in surgery this morning. Will re-attempt to evaluate pt at next available time.    Donnamarie PoagWest, Momoko Slezak OliviaN, South CarolinaPT [phone] 08/31/2013, 7:37 AM

## 2013-08-31 NOTE — Progress Notes (Signed)
I have seen and examined the patient. I agree with the findings above.  I discussed with the patient the risks and benefits of surgery for his pelvic ring and lower extremity wound dressing changes with possible vac, including the possibility of infection, nerve injury, vessel injury, wound breakdown, arthritis, symptomatic hardware, DVT/ PE, loss of motion, and need for further surgery among others.  He understood these risks and wished to proceed.   [name],Mercedes H, MD 08/31/2013 7:50 AM

## 2013-08-31 NOTE — Preoperative (Signed)
Beta Blockers   Reason not to administer Beta Blockers:Not Applicable 

## 2013-08-31 NOTE — Anesthesia Postprocedure Evaluation (Signed)
  Anesthesia Post-op Note  Patient: [name] BeachMichael G [name]  Procedure(s) Performed: Procedure(s): LEFT SACRO-ILIAC PINNING (Left) DRESSING CHANGE UNDER ANESTHESIA for right ankle and left thigh (Left)  Patient Location: PACU  Anesthesia Type:General  Level of Consciousness: awake, alert  and oriented  Airway and Oxygen Therapy: Patient Spontanous Breathing and Patient connected to nasal cannula oxygen  Post-op Pain: mild  Post-op Assessment: Post-op Vital signs reviewed, Patient's Cardiovascular Status Stable, Respiratory Function Stable, Patent Airway, No signs of Nausea or vomiting and Pain level controlled  Post-op Vital Signs: Reviewed and stable  Complications: No apparent anesthesia complications

## 2013-08-31 NOTE — Anesthesia Preprocedure Evaluation (Deleted)
Anesthesia Evaluation    Airway        Dental   Pulmonary           Cardiovascular      Neuro/Psych    GI/Hepatic   Endo/Other    Renal/GU      Musculoskeletal   Abdominal   Peds  Hematology   Anesthesia Other Findings   Reproductive/Obstetrics                              Anesthesia Physical Anesthesia Plan Anesthesia Quick Evaluation  

## 2013-09-01 ENCOUNTER — Encounter (HOSPITAL_COMMUNITY): Payer: Self-pay | Admitting: Orthopedic Surgery

## 2013-09-01 DIAGNOSIS — R339 Retention of urine, unspecified: Secondary | ICD-10-CM

## 2013-09-01 LAB — CBC
HCT: 26.9 % — ABNORMAL LOW (ref 39.0–52.0)
Hemoglobin: 9.3 g/dL — ABNORMAL LOW (ref 13.0–17.0)
MCH: 32 pg (ref 26.0–34.0)
MCHC: 34.6 g/dL (ref 30.0–36.0)
MCV: 92.4 fL (ref 78.0–100.0)
PLATELETS: 192 10*3/uL (ref 150–400)
RBC: 2.91 MIL/uL — AB (ref 4.22–5.81)
RDW: 13.6 % (ref 11.5–15.5)
WBC: 9.5 10*3/uL (ref 4.0–10.5)

## 2013-09-01 MED ORDER — HYDROMORPHONE HCL PF 1 MG/ML IJ SOLN: 0.5000 mg | INTRAMUSCULAR | Status: DC | PRN

## 2013-09-01 NOTE — Progress Notes (Signed)
Patient ID: [name] BeachMichael G Tinkle, male   DOB: Mar 06, 1951, 63 y.o.   MRN: [id number]   LOS: 3 days   Subjective: Feels hyper but otherwise ok. Wants abx stopped.   Objective: Vital signs in last 24 hours: Temp:  [97.4 F (36.3 C)-100.2 F (37.9 C)] 100.2 F (37.9 C) (01/30 0522) Pulse Rate:  [119-140] 120 (01/30 0522) Resp:  [15-21] 18 (01/30 0522) BP: (97-125)/(44-64) 125/64 mmHg (01/30 0522) SpO2:  [88 %-100 %] 88 % (01/30 0522) Last BM Date:  (pt unsure)   Laboratory  CBC  Recent Labs  08/30/13 0530 08/31/13 0245  WBC 11.2* 8.8  HGB 11.6* 10.0*  HCT 35.0* 29.9*  PLT 224 160  Pending today   Physical Exam General appearance: alert and no distress Resp: clear to auscultation bilaterally Cardio: Mild tachycardia GI: normal findings: bowel sounds normal and soft, non-tender Extremities: NVI   Assessment/Plan: PHBC  Multiple pelvic fxs s/p sacral screw -- TDWB LLE Complex left thigh lac s/p repair  Open right fibula fx s/p I&D, CR -- WBAT ABLA -- Check today Urinary retention -- Voiding trial Schizophrenia -- on home meds, appreciate psych input  Left renal mass -- Needs OP f/u with MR  FEN -- No issues  VTE -- Left SCD, Lovenox  DIspo - PT/OT, needs SNF    Freeman CaldronMichael J. Kourtlynn Trevor, PA-C Pager: [phone] General Trauma [place] Pager: [phone]   09/01/2013
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
## 2013-09-01 NOTE — Clinical Social Work Note (Signed)
Clinical Social Worker met with patient at bedside to offer continued support and discuss patient needs at discharge.  Patient is agreeable with SNF placement in [place].  CSW has completed FL2 and initiated search at this time. CSW to submit 30 day note for PASARR once signed by MD.  CSW to provide patient with available bed offers over the weekend.  CSW remains available for support and to facilitate patient discharge needs once medically ready.  Jesse , LCSW [phone]  

## 2013-09-01 NOTE — Progress Notes (Signed)
Sitting in chair. Just sore all over  Alert, nad Ext - NVI cta  Reg, mild tachy  Repeat cbc  I saw the patient, participated in the exam and medical decision making, and concur with the physician assistant's note above.  [name] SellaEric M. Andrey CampanileWilson, MD, FACS General, Bariatric, & Minimally Invasive Surgery [name] [place]
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
## 2013-09-02 MED ORDER — POLYETHYLENE GLYCOL 3350 17 G PO PACK
17.0000 g | PACK | Freq: Two times a day (BID) | ORAL | Status: DC
  Administered 2013-09-02: 17 g via ORAL
  Filled 2013-09-02 (×7): qty 1

## 2013-09-02 NOTE — Progress Notes (Signed)
Patient seen and examined.  Agree with PA's note. Has constipation.  Will start Miralax in addition to Colace and prn Dulcolax
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
## 2013-09-03 NOTE — Progress Notes (Signed)
Wound vac left thigh had air leak; reinforced wound vac dressing.
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
## 2013-09-04 NOTE — Clinical Social Work Note (Signed)
Clinical Social Worker continuing to follow patient and family for support and discharge planning needs.  CSW spoke with patient at bedside and patient has chosen [facility] at discharge - CSW notified facility who is agreeable and will verify patient Medicare days.  CSW has submitted for 30 day PAS[facility] and awaiting response.  CSW to facilitate patient discharge needs once medically stable and Pasarr received.  Macario GoldsJesse Naitik Hermann, [place] [phone]

## 2013-09-04 NOTE — Discharge Instructions (Signed)
Orthopaedic Trauma Service Discharge Instructions   General Discharge Instructions  WEIGHT BEARING STATUS: Touchdown weightbearing Left leg, Weightbearing as tolerated R leg with CAM boot  RANGE OF MOTION/ACTIVITY: as tolerated  Wound Care: dressing changes every other day.  See instructions below.  Do not apply ointments lotions or solutions to wounds.  Clean with soap and water only, ok to shower with assistance.   Diet: as you were eating previously.  Can use over the counter stool softeners and bowel preparations, such as Miralax, to help with bowel movements.  Narcotics can be constipating.  Be sure to drink plenty of fluids  STOP SMOKING OR USING NICOTINE PRODUCTS!!!!  As discussed nicotine severely impairs your body's ability to heal surgical and traumatic wounds but also impairs bone healing.  Wounds and bone heal by forming microscopic blood vessels (angiogenesis) and nicotine is a vasoconstrictor (essentially, shrinks blood vessels).  Therefore, if vasoconstriction occurs to these microscopic blood vessels they essentially disappear and are unable to deliver necessary nutrients to the healing tissue.  This is one modifiable factor that you can do to dramatically increase your chances of healing your injury.    (This means no smoking, no nicotine gum, patches, etc)  DO NOT USE NONSTEROIDAL ANTI-INFLAMMATORY DRUGS (NSAID'S)  Using products such as Advil (ibuprofen), Aleve (naproxen), Motrin (ibuprofen) for additional pain control during fracture healing can delay and/or prevent the healing response.  If you would like to take over the counter (OTC) medication, Tylenol (acetaminophen) is ok.  However, some narcotic medications that are given for pain control contain acetaminophen as well. Therefore, you should not exceed more than 4000 mg of tylenol in a day if you do not have liver disease.  Also note that there are may OTC medicines, such as cold medicines and allergy medicines that my  contain tylenol as well.  If you have any questions about medications and/or interactions please ask your doctor/PA or your pharmacist.   PAIN MEDICATION USE AND EXPECTATIONS  You have likely been given narcotic medications to help control your pain.  After a traumatic event that results in an fracture (broken bone) with or without surgery, it is ok to use narcotic pain medications to help control one's pain.  We understand that everyone responds to pain differently and each individual patient will be evaluated on a regular basis for the continued need for narcotic medications. Ideally, narcotic medication use should last no more than 6-8 weeks (coinciding with fracture healing).   As a patient it is your responsibility as well to monitor narcotic medication use and report the amount and frequency you use these medications when you come to your office visit.   We would also advise that if you are using narcotic medications, you should take a dose prior to therapy to maximize you participation.  IF YOU ARE ON NARCOTIC MEDICATIONS IT IS NOT PERMISSIBLE TO OPERATE A MOTOR VEHICLE (MOTORCYCLE/CAR/TRUCK/MOPED) OR HEAVY MACHINERY DO NOT MIX NARCOTICS WITH OTHER CNS (CENTRAL NERVOUS SYSTEM) DEPRESSANTS SUCH AS ALCOHOL       ICE AND ELEVATE INJURED/OPERATIVE EXTREMITY  Using ice and elevating the injured extremity above your heart can help with swelling and pain control.  Icing in a pulsatile fashion, such as 20 minutes on and 20 minutes off, can be followed.    Do not place ice directly on skin. Make sure there is a barrier between to skin and the ice pack.    Using frozen items such as frozen peas works well as the  conform nicely to the are that needs to be iced.  USE AN ACE WRAP OR TED HOSE FOR SWELLING CONTROL  In addition to icing and elevation, Ace wraps or TED hose are used to help limit and resolve swelling.  It is recommended to use Ace wraps or TED hose until you are informed to stop.    When  using Ace Wraps start the wrapping distally (farthest away from the body) and wrap proximally (closer to the body)   Example: If you had surgery on your leg or thing and you do not have a splint on, start the ace wrap at the toes and work your way up to the thigh        If you had surgery on your upper extremity and do not have a splint on, start the ace wrap at your fingers and work your way up to the upper arm  IF YOU ARE IN A SPLINT OR CAST DO NOT REMOVE IT FOR ANY REASON   If your splint gets wet for any reason please contact the office immediately. You may shower in your splint or cast as long as you keep it dry.  This can be done by wrapping in a cast cover or garbage back (or similar)  Do Not stick any thing down your splint or cast such as pencils, money, or hangers to try and scratch yourself with.  If you feel itchy take benadryl as prescribed on the bottle for itching  IF YOU ARE IN A CAM BOOT (BLACK BOOT)  You may remove boot periodically. Perform daily dressing changes as noted below.  Wash the liner of the boot regularly and wear a sock when wearing the boot. It is recommended that you sleep in the boot until told otherwise  CALL THE OFFICE WITH ANY QUESTIONS OR CONCERTS: [phone]     Discharge Pin Site Instructions  Dress pins daily with Kerlix roll starting on POD 2. Wrap the Kerlix so that it tamps the skin down around the pin-skin interface to prevent/limit motion of the skin relative to the pin.  (Pin-skin motion is the primary cause of pain and infection related to external fixator pin sites).  Remove any crust or coagulum that may obstruct drainage with a saline moistened gauze or soap and water.  After POD 3, if there is no discernable drainage on the pin site dressing, the interval for change can by increased to every other day.  You may shower with the fixator, cleaning all pin sites gently with soap and water.  If you have a surgical wound this needs to be  completely dry and without drainage before showering.  The extremity can be lifted by the fixator to facilitate wound care and transfers.  Notify the office/Doctor if you experience increasing drainage, redness, or pain from a pin site, or if you notice purulent (thick, snot-like) drainage.  Discharge Wound Care Instructions  Do NOT apply any ointments, solutions or lotions to pin sites or surgical wounds.  These prevent needed drainage and even though solutions like hydrogen peroxide kill bacteria, they also damage cells lining the pin sites that help fight infection.  Applying lotions or ointments can keep the wounds moist and can cause them to breakdown and open up as well. This can increase the risk for infection. When in doubt call the office.  Surgical incisions should be dressed daily.  If any drainage is noted, use one layer of adaptic, then gauze, Kerlix, and an ace wrap.  Once the incision is completely dry and without drainage, it may be left open to air out.  Showering may begin 36-48 hours later.  Cleaning gently with soap and water.  Traumatic wounds should be dressed daily as well.    One layer of adaptic, gauze, Kerlix, then ace wrap.  The adaptic can be discontinued once the draining has ceased    If you have a wet to dry dressing: wet the gauze with saline the squeeze as much saline out so the gauze is moist (not soaking wet), place moistened gauze over wound, then place a dry gauze over the moist one, followed by Kerlix wrap, then ace wrap.
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
## 2013-09-04 NOTE — Progress Notes (Signed)
Ate lunch. About to get up with therapies. He was concerned about his checkbook being missing - I found it in the bedside drawer and gave it to him. Await SNF. Patient examined and I agree with the assessment and plan  Violeta GelinasBurke Malakhai Beitler, MD, MPH, FACS Pager: [phone]  09/04/2013 1:44 PM

## 2013-09-04 NOTE — Progress Notes (Signed)
PT Cancellation Note  Patient Details Name: [name] MRN: [id number] DOB: 1950/11/19   Cancelled Treatment:    Reason Eval/Treat Not Completed: Other (comment)  Pt declined PT, stating he is in too much pain;  Will reattempt tomorrow   Van ClinesGarrigan, Dameer Speiser [facility] Hospital[facility] 09/04/2013, 4:08 PM

## 2013-09-05 MED ORDER — TAMSULOSIN HCL 0.4 MG PO CAPS: 0.8000 mg | ORAL_CAPSULE | Freq: Every day | ORAL | Status: DC

## 2013-09-05 MED ORDER — ASPIRIN 325 MG PO TBEC: 325.0000 mg | DELAYED_RELEASE_TABLET | Freq: Two times a day (BID) | ORAL | Status: DC

## 2013-09-05 MED ORDER — BUSPIRONE HCL 15 MG PO TABS: 15.0000 mg | ORAL_TABLET | Freq: Two times a day (BID) | ORAL | Status: DC

## 2013-09-05 MED ORDER — METHOCARBAMOL 500 MG PO TABS: 500.0000 mg | ORAL_TABLET | Freq: Four times a day (QID) | ORAL | Status: DC | PRN

## 2013-09-05 MED ORDER — FLUPHENAZINE HCL 5 MG PO TABS: 5.0000 mg | ORAL_TABLET | Freq: Every day | ORAL | Status: DC

## 2013-09-05 MED ORDER — ENOXAPARIN SODIUM 30 MG/0.3ML ~~LOC~~ SOLN: 30.0000 mg | Freq: Two times a day (BID) | SUBCUTANEOUS | Status: DC

## 2013-09-05 MED ORDER — QUETIAPINE FUMARATE 200 MG PO TABS: 200.0000 mg | ORAL_TABLET | Freq: Every day | ORAL | Status: DC

## 2013-09-05 MED ORDER — OXYCODONE HCL 5 MG PO TABS: 5.0000 mg | ORAL_TABLET | ORAL | Status: DC | PRN

## 2013-09-05 MED ORDER — BETHANECHOL CHLORIDE 25 MG PO TABS: 25.0000 mg | ORAL_TABLET | Freq: Four times a day (QID) | ORAL | Status: DC

## 2013-09-05 NOTE — Clinical Social Work Note (Signed)
Clinical Social Worker facilitated patient discharge including contacting patient family and facility to confirm patient discharge plans.  Clinical information faxed to facility and family agreeable with plan.  [facility] number received - 30 day.  CSW arranged ambulance transport via [facility] to [facility].  RN to call report prior to discharge.  Clinical Social Worker will sign off for now as social work intervention is no longer needed. Please consult us again if new need arises.  Macario GoldsJesse Melven Stockard, [place] [phone]
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
## 2013-09-05 NOTE — Discharge Summary (Signed)
[name] Maclaughlin, MD, MPH, FACS Pager: [phone]  

## 2013-09-05 NOTE — Progress Notes (Signed)
Going to work with therapies after he receives pain meds. SNF placement P. Voiding trial tomorrow if still here. Patient examined and I agree with the assessment and plan  Violeta GelinasBurke Luisa Louk, MD, MPH, FACS Pager: [phone]  09/05/2013 10:01 AM
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
## 2013-09-19 ENCOUNTER — Inpatient Hospital Stay (HOSPITAL_COMMUNITY)
Admission: EM | Admit: 2013-09-19 | Discharge: 2013-10-11 | DRG: 857 | Disposition: A | Discharge status: Skilled Nursing Facility | Payer: Medicare Other | Attending: Internal Medicine | Admitting: Internal Medicine

## 2013-09-19 ENCOUNTER — Encounter (HOSPITAL_BASED_OUTPATIENT_CLINIC_OR_DEPARTMENT_OTHER): Payer: Medicaid Other | Attending: General Surgery

## 2013-09-19 ENCOUNTER — Encounter (HOSPITAL_COMMUNITY): Payer: Self-pay | Admitting: Emergency Medicine

## 2013-09-19 DIAGNOSIS — D62 Acute posthemorrhagic anemia: Secondary | ICD-10-CM | POA: Diagnosis not present

## 2013-09-19 DIAGNOSIS — S8290XD Unspecified fracture of unspecified lower leg, subsequent encounter for closed fracture with routine healing: Secondary | ICD-10-CM

## 2013-09-19 DIAGNOSIS — D649 Anemia, unspecified: Secondary | ICD-10-CM

## 2013-09-19 DIAGNOSIS — F17201 Nicotine dependence, unspecified, in remission: Secondary | ICD-10-CM

## 2013-09-19 DIAGNOSIS — Y834 Other reconstructive surgery as the cause of abnormal reaction of the patient, or of later complication, without mention of misadventure at the time of the procedure: Secondary | ICD-10-CM | POA: Diagnosis present

## 2013-09-19 DIAGNOSIS — Z79899 Other long term (current) drug therapy: Secondary | ICD-10-CM

## 2013-09-19 DIAGNOSIS — Z7982 Long term (current) use of aspirin: Secondary | ICD-10-CM | POA: Insufficient documentation

## 2013-09-19 DIAGNOSIS — S32592A Other specified fracture of left pubis, initial encounter for closed fracture: Secondary | ICD-10-CM

## 2013-09-19 DIAGNOSIS — L988 Other specified disorders of the skin and subcutaneous tissue: Secondary | ICD-10-CM | POA: Diagnosis present

## 2013-09-19 DIAGNOSIS — T148XXA Other injury of unspecified body region, initial encounter: Principal | ICD-10-CM

## 2013-09-19 DIAGNOSIS — R338 Other retention of urine: Secondary | ICD-10-CM

## 2013-09-19 DIAGNOSIS — F209 Schizophrenia, unspecified: Secondary | ICD-10-CM | POA: Diagnosis present

## 2013-09-19 DIAGNOSIS — Y838 Other surgical procedures as the cause of abnormal reaction of the patient, or of later complication, without mention of misadventure at the time of the procedure: Secondary | ICD-10-CM | POA: Insufficient documentation

## 2013-09-19 DIAGNOSIS — E871 Hypo-osmolality and hyponatremia: Secondary | ICD-10-CM | POA: Diagnosis present

## 2013-09-19 DIAGNOSIS — L089 Local infection of the skin and subcutaneous tissue, unspecified: Principal | ICD-10-CM | POA: Diagnosis present

## 2013-09-19 DIAGNOSIS — S32512A Fracture of superior rim of left pubis, initial encounter for closed fracture: Secondary | ICD-10-CM

## 2013-09-19 DIAGNOSIS — IMO0002 Reserved for concepts with insufficient information to code with codable children: Secondary | ICD-10-CM | POA: Diagnosis present

## 2013-09-19 DIAGNOSIS — F17200 Nicotine dependence, unspecified, uncomplicated: Secondary | ICD-10-CM | POA: Diagnosis present

## 2013-09-19 DIAGNOSIS — IMO0001 Reserved for inherently not codable concepts without codable children: Secondary | ICD-10-CM | POA: Diagnosis not present

## 2013-09-19 DIAGNOSIS — S32302A Unspecified fracture of left ilium, initial encounter for closed fracture: Secondary | ICD-10-CM

## 2013-09-19 DIAGNOSIS — S71102A Unspecified open wound, left thigh, initial encounter: Secondary | ICD-10-CM

## 2013-09-19 DIAGNOSIS — Z818 Family history of other mental and behavioral disorders: Secondary | ICD-10-CM | POA: Diagnosis not present

## 2013-09-19 DIAGNOSIS — E876 Hypokalemia: Secondary | ICD-10-CM

## 2013-09-19 DIAGNOSIS — Y92410 Unspecified street and highway as the place of occurrence of the external cause: Secondary | ICD-10-CM

## 2013-09-19 DIAGNOSIS — N2889 Other specified disorders of kidney and ureter: Secondary | ICD-10-CM

## 2013-09-19 DIAGNOSIS — S71009A Unspecified open wound, unspecified hip, initial encounter: Secondary | ICD-10-CM | POA: Diagnosis present

## 2013-09-19 DIAGNOSIS — I96 Gangrene, not elsewhere classified: Secondary | ICD-10-CM | POA: Insufficient documentation

## 2013-09-19 DIAGNOSIS — D72829 Elevated white blood cell count, unspecified: Secondary | ICD-10-CM

## 2013-09-19 DIAGNOSIS — L02419 Cutaneous abscess of limb, unspecified: Secondary | ICD-10-CM | POA: Diagnosis present

## 2013-09-19 DIAGNOSIS — Z8781 Personal history of (healed) traumatic fracture: Secondary | ICD-10-CM | POA: Insufficient documentation

## 2013-09-19 DIAGNOSIS — T8189XA Other complications of procedures, not elsewhere classified, initial encounter: Secondary | ICD-10-CM | POA: Insufficient documentation

## 2013-09-19 DIAGNOSIS — S82401B Unspecified fracture of shaft of right fibula, initial encounter for open fracture type I or II: Secondary | ICD-10-CM

## 2013-09-19 DIAGNOSIS — L03119 Cellulitis of unspecified part of limb: Secondary | ICD-10-CM | POA: Diagnosis present

## 2013-09-19 DIAGNOSIS — S329XXA Fracture of unspecified parts of lumbosacral spine and pelvis, initial encounter for closed fracture: Secondary | ICD-10-CM

## 2013-09-19 LAB — CBC WITH DIFFERENTIAL/PLATELET
Basophils Absolute: 0 10*3/uL (ref 0.0–0.1)
Basophils Relative: 0 % (ref 0–1)
Eosinophils Absolute: 0.3 10*3/uL (ref 0.0–0.7)
Eosinophils Relative: 2 % (ref 0–5)
HCT: 36.8 % — ABNORMAL LOW (ref 39.0–52.0)
Hemoglobin: 12 g/dL — ABNORMAL LOW (ref 13.0–17.0)
Lymphocytes Relative: 9 % — ABNORMAL LOW (ref 12–46)
Lymphs Abs: 1.1 10*3/uL (ref 0.7–4.0)
MCH: 30.3 pg (ref 26.0–34.0)
MCHC: 32.6 g/dL (ref 30.0–36.0)
MCV: 92.9 fL (ref 78.0–100.0)
Monocytes Absolute: 1.2 10*3/uL — ABNORMAL HIGH (ref 0.1–1.0)
Monocytes Relative: 11 % (ref 3–12)
Neutro Abs: 9 10*3/uL — ABNORMAL HIGH (ref 1.7–7.7)
Neutrophils Relative %: 77 % (ref 43–77)
Platelets: 369 10*3/uL (ref 150–400)
RBC: 3.96 MIL/uL — ABNORMAL LOW (ref 4.22–5.81)
RDW: 14.5 % (ref 11.5–15.5)
WBC: 11.6 10*3/uL — ABNORMAL HIGH (ref 4.0–10.5)

## 2013-09-19 LAB — COMPREHENSIVE METABOLIC PANEL
ALT: 17 U/L (ref 0–53)
AST: 29 U/L (ref 0–37)
Albumin: 2.7 g/dL — ABNORMAL LOW (ref 3.5–5.2)
Alkaline Phosphatase: 171 U/L — ABNORMAL HIGH (ref 39–117)
BUN: 16 mg/dL (ref 6–23)
CO2: 27 mEq/L (ref 19–32)
Calcium: 9 mg/dL (ref 8.4–10.5)
Chloride: 97 mEq/L (ref 96–112)
Creatinine, Ser: 0.85 mg/dL (ref 0.50–1.35)
GFR calc Af Amer: >90 mL/min (ref >90)
GFR calc non Af Amer: >90 mL/min (ref >90)
Glucose, Bld: 126 mg/dL — ABNORMAL HIGH (ref 70–99)
Potassium: 4.2 mEq/L (ref 3.7–5.3)
Sodium: 135 mEq/L — ABNORMAL LOW (ref 137–147)
Total Bilirubin: 0.8 mg/dL (ref 0.3–1.2)
Total Protein: 7.2 g/dL (ref 6.0–8.3)

## 2013-09-19 MED ORDER — SODIUM CHLORIDE 0.9 % IV SOLN
INTRAVENOUS | Status: DC
  Administered 2013-09-19 – 2013-09-20 (×2): via INTRAVENOUS

## 2013-09-19 MED ORDER — PIPERACILLIN-TAZOBACTAM 3.375 G IVPB
3.3750 g | Freq: Once | INTRAVENOUS | Status: AC
  Administered 2013-09-19: 3.375 g via INTRAVENOUS
  Filled 2013-09-19 (×2): qty 50

## 2013-09-19 MED ORDER — VANCOMYCIN HCL IN DEXTROSE 1-5 GM/200ML-% IV SOLN
1000.0000 mg | Freq: Three times a day (TID) | INTRAVENOUS | Status: DC
  Administered 2013-09-20 – 2013-09-25 (×16): 1000 mg via INTRAVENOUS
  Filled 2013-09-19 (×20): qty 200

## 2013-09-19 MED ORDER — VANCOMYCIN HCL IN DEXTROSE 1-5 GM/200ML-% IV SOLN
1000.0000 mg | Freq: Once | INTRAVENOUS | Status: AC
  Administered 2013-09-19: 1000 mg via INTRAVENOUS
  Filled 2013-09-19 (×2): qty 200

## 2013-09-19 MED ORDER — PIPERACILLIN-TAZOBACTAM 3.375 G IVPB
3.3750 g | Freq: Three times a day (TID) | INTRAVENOUS | Status: DC
  Administered 2013-09-20 – 2013-09-25 (×16): 3.375 g via INTRAVENOUS
  Filled 2013-09-19 (×19): qty 50

## 2013-09-19 MED ORDER — OXYCODONE HCL 5 MG PO TABS
10.0000 mg | ORAL_TABLET | ORAL | Status: DC | PRN
  Administered 2013-09-19 – 2013-09-20 (×4): 10 mg via ORAL
  Filled 2013-09-19 (×4): qty 2

## 2013-09-19 MED ORDER — CEFAZOLIN SODIUM 1-5 GM-% IV SOLN
1.0000 g | Freq: Once | INTRAVENOUS | Status: AC
  Administered 2013-09-19: 1 g via INTRAVENOUS
  Filled 2013-09-19: qty 50

## 2013-09-19 MED ORDER — ENOXAPARIN SODIUM 40 MG/0.4ML ~~LOC~~ SOLN
40.0000 mg | SUBCUTANEOUS | Status: DC
  Administered 2013-09-19 – 2013-09-20 (×2): 40 mg via SUBCUTANEOUS
  Filled 2013-09-19 (×4): qty 0.4

## 2013-09-19 NOTE — Progress Notes (Addendum)
ANTIBIOTIC CONSULT NOTE - INITIAL  Pharmacy Consult for Vancomycin/Zosyn Indication: Wound infection   No Known Allergies  Patient Measurements:     Vital Signs: Temp: 98.9 F (37.2 C) (02/17 1452) Temp src: Oral (02/17 1452) BP: 112/60 mmHg (02/17 1452) Pulse Rate: 107 (02/17 1452) Intake/Output from previous day:   Intake/Output from this shift:    Labs:  Recent Labs  09/19/13 1505  WBC 11.6*  HGB 12.0*  PLT 369  CREATININE 0.85   The CrCl is unknown because both a height and weight (above a minimum accepted value) are required for this calculation. No results found for this basename: VANCOTROUGH, Leodis BinetVANCOPEAK, VANCORANDOM, GENTTROUGH, GENTPEAK, GENTRANDOM, TOBRATROUGH, TOBRAPEAK, TOBRARND, AMIKACINPEAK, AMIKACINTROU, AMIKACIN,  in the last 72 hours   Microbiology: Recent Results (from the past 720 hour(s))  MRSA PCR SCREENING     Status: None   Collection Time    08/29/13 11:08 PM      Result Value Ref Range Status   MRSA by PCR NEGATIVE  NEGATIVE Final   Comment:            The GeneXpert MRSA Assay (FDA     approved for NASAL specimens     only), is one component of a     comprehensive MRSA colonization     surveillance program. It is not     intended to diagnose MRSA     infection nor to guide or     monitor treatment for     MRSA infections.    Medical History: Past Medical History  Diagnosis Date  . Schizophrenia     Medications:  Scheduled:   Infusions:  .  ceFAZolin (ANCEF) IV     PRN:  Assessment:  63 yo M with extensive L thigh wound and Right Leg starting Vancomycin/Zosyn per Pharmacy  Blood and Wound cultures sent 2/17  WBC 11.6  Scr WNL  Weight 93 kg   Goal of Therapy:  Vancomycin trough level 15-20 mcg/ml  Plan:  1.) Vancomycin 1 gram IV q8h; Zosyn 3.375 grams IV q8h 2.) F/u Ht/Wt/Renal function 3.) Check vancomycin trough as needed 4.) F/u blood and wound infections and orthopedic's plan   BorgerdingLoma Messing, Diquan Kassis Patricia  PharmD Pager #: [phone] 4:19 PM 09/19/2013
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
## 2013-09-19 NOTE — ED Notes (Signed)
Bed: WA24 Expected date:  Expected time:  Means of arrival:  Comments: ems 

## 2013-09-19 NOTE — ED Notes (Signed)
MD at bedside. Surgeon at bedside. Pt's dressing changed by surgeon. Wound cultures obtained. Pt tolerated well.
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
## 2013-09-20 ENCOUNTER — Encounter (HOSPITAL_COMMUNITY): Payer: Self-pay | Admitting: *Deleted

## 2013-09-20 DIAGNOSIS — D72829 Elevated white blood cell count, unspecified: Secondary | ICD-10-CM

## 2013-09-20 DIAGNOSIS — L03119 Cellulitis of unspecified part of limb: Secondary | ICD-10-CM

## 2013-09-20 DIAGNOSIS — E876 Hypokalemia: Secondary | ICD-10-CM

## 2013-09-20 DIAGNOSIS — F209 Schizophrenia, unspecified: Secondary | ICD-10-CM

## 2013-09-20 DIAGNOSIS — L02419 Cutaneous abscess of limb, unspecified: Secondary | ICD-10-CM

## 2013-09-20 LAB — COMPREHENSIVE METABOLIC PANEL
ALT: 15 U/L (ref 0–53)
AST: 21 U/L (ref 0–37)
Albumin: 2.4 g/dL — ABNORMAL LOW (ref 3.5–5.2)
Alkaline Phosphatase: 147 U/L — ABNORMAL HIGH (ref 39–117)
BUN: 13 mg/dL (ref 6–23)
CALCIUM: 8.2 mg/dL — AB (ref 8.4–10.5)
CO2: 25 meq/L (ref 19–32)
Chloride: 96 mEq/L (ref 96–112)
Creatinine, Ser: 0.84 mg/dL (ref 0.50–1.35)
GFR calc Af Amer: >90 mL/min (ref >90)
GFR calc non Af Amer: >90 mL/min (ref >90)
Glucose, Bld: 106 mg/dL — ABNORMAL HIGH (ref 70–99)
Potassium: 3.6 mEq/L — ABNORMAL LOW (ref 3.7–5.3)
SODIUM: 132 meq/L — AB (ref 137–147)
Total Bilirubin: 0.8 mg/dL (ref 0.3–1.2)
Total Protein: 6 g/dL (ref 6.0–8.3)

## 2013-09-20 LAB — CBC WITH DIFFERENTIAL/PLATELET
BASOS PCT: 0 % (ref 0–1)
Basophils Absolute: 0 10*3/uL (ref 0.0–0.1)
EOS ABS: 0.3 10*3/uL (ref 0.0–0.7)
EOS PCT: 4 % (ref 0–5)
HEMATOCRIT: 33.5 % — AB (ref 39.0–52.0)
HEMOGLOBIN: 10.6 g/dL — AB (ref 13.0–17.0)
Lymphocytes Relative: 13 % (ref 12–46)
Lymphs Abs: 1.1 10*3/uL (ref 0.7–4.0)
MCH: 29.4 pg (ref 26.0–34.0)
MCHC: 31.6 g/dL (ref 30.0–36.0)
MCV: 92.8 fL (ref 78.0–100.0)
MONO ABS: 1 10*3/uL (ref 0.1–1.0)
MONOS PCT: 12 % (ref 3–12)
Neutro Abs: 6.1 10*3/uL (ref 1.7–7.7)
Neutrophils Relative %: 71 % (ref 43–77)
Platelets: 321 10*3/uL (ref 150–400)
RBC: 3.61 MIL/uL — ABNORMAL LOW (ref 4.22–5.81)
RDW: 14.6 % (ref 11.5–15.5)
WBC: 8.5 10*3/uL (ref 4.0–10.5)

## 2013-09-20 LAB — PROTIME-INR
INR: 1.02 (ref 0.00–1.49)
Prothrombin Time: 13.2 seconds (ref 11.6–15.2)

## 2013-09-20 LAB — MAGNESIUM: Magnesium: 2 mg/dL (ref 1.5–2.5)

## 2013-09-20 LAB — PHOSPHORUS: Phosphorus: 3 mg/dL (ref 2.3–4.6)

## 2013-09-20 MED ORDER — OXYCODONE HCL 5 MG PO TABS
5.0000 mg | ORAL_TABLET | ORAL | Status: DC | PRN
  Administered 2013-09-21 (×2): 10 mg via ORAL
  Administered 2013-09-23 – 2013-10-02 (×25): 15 mg via ORAL
  Administered 2013-10-06 – 2013-10-11 (×15): 10 mg via ORAL
  Filled 2013-09-20: qty 3
  Filled 2013-09-20 (×4): qty 2
  Filled 2013-09-20 (×2): qty 3
  Filled 2013-09-20 (×6): qty 2
  Filled 2013-09-20 (×11): qty 3
  Filled 2013-09-20: qty 2
  Filled 2013-09-20 (×2): qty 3
  Filled 2013-09-20: qty 2
  Filled 2013-09-20 (×3): qty 3
  Filled 2013-09-20 (×3): qty 2
  Filled 2013-09-20 (×3): qty 3
  Filled 2013-09-20: qty 2
  Filled 2013-09-20: qty 3
  Filled 2013-09-20: qty 2
  Filled 2013-09-20 (×2): qty 3

## 2013-09-20 MED ORDER — TAMSULOSIN HCL 0.4 MG PO CAPS
0.8000 mg | ORAL_CAPSULE | Freq: Every day | ORAL | Status: DC
  Administered 2013-09-20 – 2013-10-11 (×21): 0.8 mg via ORAL
  Filled 2013-09-20 (×24): qty 2

## 2013-09-20 MED ORDER — QUETIAPINE FUMARATE 200 MG PO TABS
200.0000 mg | ORAL_TABLET | Freq: Every day | ORAL | Status: DC
  Administered 2013-09-20 – 2013-10-10 (×21): 200 mg via ORAL
  Filled 2013-09-20 (×22): qty 1

## 2013-09-20 MED ORDER — FLUPHENAZINE HCL 5 MG PO TABS
15.0000 mg | ORAL_TABLET | Freq: Every day | ORAL | Status: DC
  Administered 2013-09-20 – 2013-10-10 (×21): 15 mg via ORAL
  Filled 2013-09-20 (×22): qty 1

## 2013-09-20 MED ORDER — POTASSIUM CHLORIDE CRYS ER 20 MEQ PO TBCR
40.0000 meq | EXTENDED_RELEASE_TABLET | Freq: Once | ORAL | Status: AC
  Administered 2013-09-20: 40 meq via ORAL
  Filled 2013-09-20: qty 2

## 2013-09-20 MED ORDER — BETHANECHOL CHLORIDE 25 MG PO TABS
25.0000 mg | ORAL_TABLET | Freq: Four times a day (QID) | ORAL | Status: DC
  Administered 2013-09-20 – 2013-10-11 (×79): 25 mg via ORAL
  Filled 2013-09-20 (×96): qty 1

## 2013-09-20 MED ORDER — BUSPIRONE HCL 15 MG PO TABS
15.0000 mg | ORAL_TABLET | Freq: Two times a day (BID) | ORAL | Status: DC
  Administered 2013-09-20 – 2013-10-11 (×41): 15 mg via ORAL
  Filled 2013-09-20 (×45): qty 1
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
## 2013-09-20 NOTE — Consult Note (Signed)
I have reviewed and discussed in detail with Mr. Renae Fickleaul the patient's presentation, examination findings, and I formulated the plan outlined above.  I also spoke at length with Dr. Ave Filterhandler and reviewed his findings and procedure, along with photos of the area.  OR tomorrow for debridement and vac, delayed STSG that we are hopeful can occur early next week.  Myrene GalasMichael Keitha Kolk, MD [facility] [phone] (p)

## 2013-09-20 NOTE — Care Management Note (Signed)
   CARE MANAGEMENT NOTE 09/20/2013  Patient:  Larene BeachHIDER,Loc G   Account Number:  [id number]  Date Initiated:  09/20/2013  Documentation initiated by:  Naesha Buckalew  Subjective/Objective Assessment:   63 yo male admitted with  L thigh cellulitis, abscess and soft tissue necrosis     Action/Plan:   Home vs SNF   Anticipated DC Date:     Anticipated DC Plan:        DC Planning Services  CM consult      Choice offered to / List presented to:  NA   DME arranged  NA      DME agency  NA     HH arranged  NA      HH agency  NA   Status of service:  In process, will continue to follow Medicare Important Message given?   (If response is "NO", the following Medicare IM given date fields will be blank) Date Medicare IM given:   Date Additional Medicare IM given:    Discharge Disposition:    Per UR Regulation:  Reviewed for med. necessity/level of care/duration of stay  If discussed at Long Length of Stay Meetings, dates discussed:    Comments:  09/20/13 1100 Juanice Warburton,MSN,RN [phone] Chart reviewed for utilization of services.[facility]. Cm to continue to follow for dc needs.
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
## 2013-09-20 NOTE — Progress Notes (Signed)
Clinical Social Work Department BRIEF PSYCHOSOCIAL ASSESSMENT 09/20/2013  Patient:  [name],[name]     Account Number:  [id number]     Admit date:  09/19/2013  Clinical Social Worker:  BYRD,Shatina Streets, LCSWA  Date/Time:  09/20/2013 11:00 AM  Referred by:  Physician  Date Referred:  09/20/2013 Referred for  SNF Placement   Other Referral:   Interview type:  Patient Other interview type:    PSYCHOSOCIAL DATA Living Status:  FACILITY Admitted from facility:  [facility] Level of care:  Skilled Nursing Facility Primary support name:  Bisaco Ebata/significant other/[phone] Primary support relationship to patient:  FRIEND Degree of support available:   unknown at this time    CURRENT CONCERNS Current Concerns  Post-Acute Placement   Other Concerns:    SOCIAL WORK ASSESSMENT / PLAN CSW received referral that pt admitted from [facility].    CSW reviewed chart. Pt admitted for cellulitis and abscess of left thigh and right leg. Per chart, plan is for pt to transfer to [place] for surgical intervention and wound vac.    CSW met with pt at bedside. CSW introduced self and explained role. Pt knowledgeable about medical plan and aware of plan to transfer to [facility] for surgical intervention. Pt confirmed that he is currently at resident at [facility]. Pt discussed that he has not been satisfied with care he is receiving at [facility] and if continues to require rehab then pt would like to explore other options.    CSW provided support and discussed that it will be most beneficial for CSW at [facility] to follow up following surgical intervention in order to explore other options as it will be important to make sure most accurate clinical information is on FL2 for SNF search. Pt agreeable to this plan.    CSW completed FL2, but FL2 will need to be updated following pt surgery to include updates and appropriate wound care needed.     CSW to provide report to [facility] CSW when pt transfers and receives room assignment.    CSW to continue to follow to assist with pt discharge planning needs.   Assessment/plan status:  Psychosocial Support/Ongoing Assessment of Needs Other assessment/ plan:   Information/referral to community resources:   [place] SNF search when appropriate    PATIENT'S/FAMILY'S RESPONSE TO PLAN OF CARE: Pt alert and oriented x 4. Pt appears calm given current medical diagnoses and plan for surgery tomorrow. Pt reports that he has not been satisfied with care at [facility] and is hopeful to be able to go to another facility upon discharge.    Joane Postel, MSW, LCSW Clinical Social Work [phone] 
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
## 2013-09-20 NOTE — Progress Notes (Signed)
[facility].  I spoke with his nurse Rene KocherRegina and she informed me [facility].I spoke with Corrie DandyMary in Patient Placement , who said patient may not get a bed tonight.  I called Rene KocherRegina back with instructions of arrival time of 1010, NPO after midnight and medications  he may take with a sip of water in am. Medications he may take in am are: Flomax, Urecholine, Buspar and if needed Oxy IR.

## 2013-09-21 ENCOUNTER — Encounter (HOSPITAL_COMMUNITY): Payer: Medicare Other | Admitting: Anesthesiology

## 2013-09-21 ENCOUNTER — Inpatient Hospital Stay (HOSPITAL_COMMUNITY): Payer: Medicare Other | Admitting: Anesthesiology

## 2013-09-21 ENCOUNTER — Inpatient Hospital Stay (HOSPITAL_COMMUNITY): Admission: RE | Admit: 2013-09-21 | Payer: Medicare Other | Source: Ambulatory Visit | Admitting: Orthopedic Surgery

## 2013-09-21 ENCOUNTER — Encounter (HOSPITAL_COMMUNITY)
Admission: EM | Disposition: A | Discharge status: Skilled Nursing Facility | Payer: Medicare Other | Source: Home / Self Care | Attending: Internal Medicine

## 2013-09-21 ENCOUNTER — Encounter (HOSPITAL_COMMUNITY): Payer: Self-pay | Admitting: Anesthesiology

## 2013-09-21 DIAGNOSIS — S71109A Unspecified open wound, unspecified thigh, initial encounter: Secondary | ICD-10-CM

## 2013-09-21 DIAGNOSIS — D62 Acute posthemorrhagic anemia: Secondary | ICD-10-CM

## 2013-09-21 DIAGNOSIS — S329XXA Fracture of unspecified parts of lumbosacral spine and pelvis, initial encounter for closed fracture: Secondary | ICD-10-CM

## 2013-09-21 DIAGNOSIS — L089 Local infection of the skin and subcutaneous tissue, unspecified: Secondary | ICD-10-CM | POA: Diagnosis not present

## 2013-09-21 DIAGNOSIS — S71009A Unspecified open wound, unspecified hip, initial encounter: Secondary | ICD-10-CM

## 2013-09-21 DIAGNOSIS — S32309A Unspecified fracture of unspecified ilium, initial encounter for closed fracture: Secondary | ICD-10-CM

## 2013-09-21 HISTORY — DX: Adverse effect of unspecified anesthetic, initial encounter: T41.45XA

## 2013-09-21 HISTORY — PX: INCISION AND DRAINAGE OF WOUND: SHX1803

## 2013-09-21 HISTORY — DX: Fracture of unspecified parts of lumbosacral spine and pelvis, initial encounter for closed fracture: S32.9XXA

## 2013-09-21 LAB — GRAM STAIN

## 2013-09-21 LAB — CBC
HCT: 34.4 % — ABNORMAL LOW (ref 39.0–52.0)
Hemoglobin: 10.9 g/dL — ABNORMAL LOW (ref 13.0–17.0)
MCH: 29.2 pg (ref 26.0–34.0)
MCHC: 31.7 g/dL (ref 30.0–36.0)
MCV: 92.2 fL (ref 78.0–100.0)
PLATELETS: 325 10*3/uL (ref 150–400)
RBC: 3.73 MIL/uL — ABNORMAL LOW (ref 4.22–5.81)
RDW: 15.1 % (ref 11.5–15.5)
WBC: 8 10*3/uL (ref 4.0–10.5)

## 2013-09-21 LAB — SURGICAL PCR SCREEN
MRSA, PCR: NEGATIVE
STAPHYLOCOCCUS AUREUS: NEGATIVE

## 2013-09-21 LAB — CREATININE, SERUM
Creatinine, Ser: 0.79 mg/dL (ref 0.50–1.35)
GFR calc Af Amer: >90 mL/min (ref >90)
GFR calc non Af Amer: >90 mL/min (ref >90)

## 2013-09-21 SURGERY — IRRIGATION AND DEBRIDEMENT WOUND
Anesthesia: General | Site: Thigh | Laterality: Left

## 2013-09-21 MED ORDER — SODIUM CHLORIDE 0.9 % IR SOLN
Status: DC | PRN
  Administered 2013-09-21: 1000 mL

## 2013-09-21 MED ORDER — FENTANYL CITRATE 0.05 MG/ML IJ SOLN
INTRAMUSCULAR | Status: AC
  Filled 2013-09-21: qty 5

## 2013-09-21 MED ORDER — ONDANSETRON HCL 4 MG/2ML IJ SOLN
INTRAMUSCULAR | Status: DC | PRN
Start: 2013-09-21 — End: 2013-09-21
  Administered 2013-09-21: 4 mg via INTRAVENOUS

## 2013-09-21 MED ORDER — METOCLOPRAMIDE HCL 5 MG PO TABS: 5.0000 mg | ORAL_TABLET | Freq: Three times a day (TID) | ORAL | Status: DC | PRN

## 2013-09-21 MED ORDER — ONDANSETRON HCL 4 MG PO TABS: 4.0000 mg | ORAL_TABLET | Freq: Four times a day (QID) | ORAL | Status: DC | PRN

## 2013-09-21 MED ORDER — ROCURONIUM BROMIDE 100 MG/10ML IV SOLN: INTRAVENOUS | Status: DC | PRN

## 2013-09-21 MED ORDER — ONDANSETRON HCL 4 MG/2ML IJ SOLN: 4.0000 mg | Freq: Four times a day (QID) | INTRAMUSCULAR | Status: DC | PRN

## 2013-09-21 MED ORDER — OXYCODONE-ACETAMINOPHEN 5-325 MG PO TABS
1.0000 | ORAL_TABLET | ORAL | Status: DC | PRN
  Administered 2013-09-21 – 2013-10-06 (×20): 2 via ORAL
  Filled 2013-09-21 (×21): qty 2

## 2013-09-21 MED ORDER — LACTATED RINGERS IV SOLN
INTRAVENOUS | Status: DC | PRN
  Administered 2013-09-21: 15:00:00 via INTRAVENOUS

## 2013-09-21 MED ORDER — POTASSIUM CHLORIDE IN NACL 20-0.9 MEQ/L-% IV SOLN
INTRAVENOUS | Status: DC
  Administered 2013-09-21 – 2013-09-29 (×12): via INTRAVENOUS
  Filled 2013-09-21 (×20): qty 1000

## 2013-09-21 MED ORDER — LACTATED RINGERS IV SOLN
INTRAVENOUS | Status: DC
  Administered 2013-09-21: 13:00:00 via INTRAVENOUS

## 2013-09-21 MED ORDER — ARTIFICIAL TEARS OP OINT
TOPICAL_OINTMENT | OPHTHALMIC | Status: DC | PRN
  Administered 2013-09-21: 1 via OPHTHALMIC

## 2013-09-21 MED ORDER — ACETAMINOPHEN 325 MG PO TABS
325.0000 mg | ORAL_TABLET | Freq: Four times a day (QID) | ORAL | Status: DC | PRN
  Administered 2013-09-29 – 2013-10-01 (×2): 650 mg via ORAL
  Filled 2013-09-21 (×2): qty 2

## 2013-09-21 MED ORDER — SUCCINYLCHOLINE CHLORIDE 20 MG/ML IJ SOLN
INTRAMUSCULAR | Status: DC | PRN
  Administered 2013-09-21: 130 mg via INTRAVENOUS

## 2013-09-21 MED ORDER — PROPOFOL 10 MG/ML IV BOLUS
INTRAVENOUS | Status: DC | PRN
  Administered 2013-09-21: 250 mg via INTRAVENOUS

## 2013-09-21 MED ORDER — HYDROMORPHONE HCL PF 1 MG/ML IJ SOLN
0.5000 mg | INTRAMUSCULAR | Status: DC | PRN
  Administered 2013-09-21 – 2013-10-08 (×5): 1 mg via INTRAVENOUS
  Filled 2013-09-21 (×5): qty 1

## 2013-09-21 MED ORDER — LIDOCAINE HCL (CARDIAC) 20 MG/ML IV SOLN
INTRAVENOUS | Status: DC | PRN
  Administered 2013-09-21: 5 mg via INTRAVENOUS

## 2013-09-21 MED ORDER — ENOXAPARIN SODIUM 40 MG/0.4ML ~~LOC~~ SOLN
40.0000 mg | SUBCUTANEOUS | Status: DC
  Administered 2013-09-22 – 2013-10-11 (×18): 40 mg via SUBCUTANEOUS
  Filled 2013-09-21 (×21): qty 0.4

## 2013-09-21 MED ORDER — METOCLOPRAMIDE HCL 5 MG/ML IJ SOLN: 5.0000 mg | Freq: Three times a day (TID) | INTRAMUSCULAR | Status: DC | PRN

## 2013-09-21 MED ORDER — FENTANYL CITRATE 0.05 MG/ML IJ SOLN
INTRAMUSCULAR | Status: DC | PRN
  Administered 2013-09-21 (×8): 50 ug via INTRAVENOUS

## 2013-09-21 MED ORDER — SODIUM CHLORIDE 0.9 % IR SOLN
Status: DC | PRN
  Administered 2013-09-21 (×4): 1000 mL

## 2013-09-21 SURGICAL SUPPLY — 53 items
BANDAGE ELASTIC 4 VELCRO ST LF (GAUZE/BANDAGES/DRESSINGS) ×2 IMPLANT
BANDAGE ELASTIC 6 VELCRO ST LF (GAUZE/BANDAGES/DRESSINGS) ×2 IMPLANT
BANDAGE GAUZE ELAST BULKY 4 IN (GAUZE/BANDAGES/DRESSINGS) ×6 IMPLANT
BLADE SURG 10 STRL SS (BLADE) ×3 IMPLANT
BNDG COHESIVE 4X5 TAN STRL (GAUZE/BANDAGES/DRESSINGS) ×3 IMPLANT
BNDG GAUZE ELAST 4 BULKY (GAUZE/BANDAGES/DRESSINGS) ×4 IMPLANT
BRUSH SCRUB DISP (MISCELLANEOUS) ×6 IMPLANT
BRUSH SCRUB EZ PLAIN DRY (MISCELLANEOUS) ×14 IMPLANT
CANISTER WOUND CARE 500ML ATS (WOUND CARE) ×2 IMPLANT
COVER SURGICAL LIGHT HANDLE (MISCELLANEOUS) ×6 IMPLANT
DRAPE ORTHO SPLIT 77X108 STRL (DRAPES) ×6
DRAPE SURG ORHT 6 SPLT 77X108 (DRAPES) ×2 IMPLANT
DRAPE U-SHAPE 47X51 STRL (DRAPES) ×3 IMPLANT
DRSG ADAPTIC 3X8 NADH LF (GAUZE/BANDAGES/DRESSINGS) ×3 IMPLANT
DRSG MEPITEL 4X7.2 (GAUZE/BANDAGES/DRESSINGS) ×4 IMPLANT
DRSG VAC ATS MED SENSATRAC (GAUZE/BANDAGES/DRESSINGS) ×2 IMPLANT
ELECT CAUTERY BLADE 6.4 (BLADE) IMPLANT
ELECT REM PT RETURN 9FT ADLT (ELECTROSURGICAL)
ELECTRODE REM PT RTRN 9FT ADLT (ELECTROSURGICAL) IMPLANT
GLOVE BIO SURGEON STRL SZ7.5 (GLOVE) ×3 IMPLANT
GLOVE BIO SURGEON STRL SZ8 (GLOVE) ×3 IMPLANT
GLOVE BIOGEL PI IND STRL 7.5 (GLOVE) ×1 IMPLANT
GLOVE BIOGEL PI IND STRL 8 (GLOVE) ×1 IMPLANT
GLOVE BIOGEL PI INDICATOR 7.5 (GLOVE) ×2
GLOVE BIOGEL PI INDICATOR 8 (GLOVE) ×2
GOWN STRL REUS W/ TWL LRG LVL3 (GOWN DISPOSABLE) ×2 IMPLANT
GOWN STRL REUS W/ TWL XL LVL3 (GOWN DISPOSABLE) ×1 IMPLANT
GOWN STRL REUS W/TWL LRG LVL3 (GOWN DISPOSABLE) ×6
GOWN STRL REUS W/TWL XL LVL3 (GOWN DISPOSABLE) ×3
HANDPIECE INTERPULSE COAX TIP (DISPOSABLE)
KIT BASIN OR (CUSTOM PROCEDURE TRAY) ×3 IMPLANT
KIT ROOM TURNOVER OR (KITS) ×3 IMPLANT
MANIFOLD NEPTUNE II (INSTRUMENTS) ×3 IMPLANT
NS IRRIG 1000ML POUR BTL (IV SOLUTION) ×3 IMPLANT
PACK ORTHO EXTREMITY (CUSTOM PROCEDURE TRAY) ×3 IMPLANT
PAD ARMBOARD 7.5X6 YLW CONV (MISCELLANEOUS) ×6 IMPLANT
PADDING CAST COTTON 6X4 STRL (CAST SUPPLIES) ×3 IMPLANT
SET HNDPC FAN SPRY TIP SCT (DISPOSABLE) IMPLANT
SET IRRIG Y TYPE TUR BLADDER L (SET/KITS/TRAYS/PACK) ×2 IMPLANT
SPONGE GAUZE 4X4 12PLY (GAUZE/BANDAGES/DRESSINGS) ×3 IMPLANT
SPONGE GAUZE 4X4 12PLY STER LF (GAUZE/BANDAGES/DRESSINGS) ×2 IMPLANT
SPONGE LAP 18X18 X RAY DECT (DISPOSABLE) ×3 IMPLANT
STAPLER VISISTAT 35W (STAPLE) ×4 IMPLANT
STOCKINETTE IMPERVIOUS 9X36 MD (GAUZE/BANDAGES/DRESSINGS) ×3 IMPLANT
SUT PDS AB 2-0 CT1 27 (SUTURE) IMPLANT
TOWEL OR 17X24 6PK STRL BLUE (TOWEL DISPOSABLE) ×3 IMPLANT
TOWEL OR 17X26 10 PK STRL BLUE (TOWEL DISPOSABLE) ×6 IMPLANT
TUBE ANAEROBIC SPECIMEN COL (MISCELLANEOUS) IMPLANT
TUBE CONNECTING 12'X1/4 (SUCTIONS) ×1
TUBE CONNECTING 12X1/4 (SUCTIONS) ×2 IMPLANT
UNDERPAD 30X30 INCONTINENT (UNDERPADS AND DIAPERS) ×3 IMPLANT
WATER STERILE IRR 1000ML POUR (IV SOLUTION) ×3 IMPLANT
YANKAUER SUCT BULB TIP NO VENT (SUCTIONS) ×3 IMPLANT
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
## 2013-09-21 NOTE — Transfer of Care (Signed)
Immediate Anesthesia Transfer of Care Note  Patient: [name]  Procedure(s) Performed: Procedure(s): IRRIGATION AND DEBRIDEMENT SOFT TISSUE WOUND WITH LARGE WOUND VAC PLACEMENT (Left)  Patient Location: PACU  Anesthesia Type:General  Level of Consciousness: awake, alert  and oriented  Airway & Oxygen Therapy: Patient Spontanous Breathing and Patient connected to nasal cannula oxygen  Post-op Assessment: Report given to PACU RN, Post -op Vital signs reviewed and stable and Patient moving all extremities X 4  Post vital signs: Reviewed and stable  Complications: No apparent anesthesia complications

## 2013-09-21 NOTE — Anesthesia Procedure Notes (Addendum)
Procedure Name: Intubation Date/Time: 09/21/2013 3:05 PM Performed by: Fransisca KaufmannMEYER, Marcedes Tech E Pre-anesthesia Checklist: Patient identified, Emergency Drugs available, Suction available, Patient being monitored and Timeout performed Patient Re-evaluated:Patient Re-evaluated prior to inductionOxygen Delivery Method: Circle system utilized Preoxygenation: Pre-oxygenation with 100% oxygen Intubation Type: IV induction and Rapid sequence Laryngoscope Size: Hyacinth MeekerMiller and 3 (attempted DL with Hyacinth MeekerMiller 2/short/DL with Hyacinth MeekerMiller 3 good view) Grade View: Grade III Tube type: Oral Tube size: 7.5 mm Number of attempts: 2 Airway Equipment and Method: Stylet Placement Confirmation: ETT inserted through vocal cords under direct vision,  positive ETCO2 and breath sounds checked- equal and bilateral Secured at: 23 cm Tube secured with: Tape Dental Injury: Teeth and Oropharynx as per pre-operative assessment  Difficulty Due To: Difficulty was anticipated

## 2013-09-21 NOTE — Anesthesia Preprocedure Evaluation (Deleted)
Anesthesia Evaluation    Airway       Dental   Pulmonary Current Smoker,          Cardiovascular     Neuro/Psych    GI/Hepatic   Endo/Other    Renal/GU      Musculoskeletal   Abdominal   Peds  Hematology   Anesthesia Other Findings   Reproductive/Obstetrics                          Anesthesia Physical Anesthesia Plan  ASA: II  Anesthesia Plan: General   Post-op Pain Management:    Induction: Intravenous  Airway Management Planned: Oral ETT  Additional Equipment:   Intra-op Plan:   Post-operative Plan:   Informed Consent: I have reviewed the patients History and Physical, chart, labs and discussed the procedure including the risks, benefits and alternatives for the proposed anesthesia with the patient or authorized representative who has indicated his/her understanding and acceptance.   Dental advisory given  Plan Discussed with: CRNA and Anesthesiologist  Anesthesia Plan Comments:         Anesthesia Quick Evaluation                                  Anesthesia Evaluation  Patient identified by MRN, date of birth, ID band Patient awake    Reviewed: Allergy & Precautions, H&P , NPO status , Patient's Chart, lab work & pertinent test results, reviewed documented beta blocker date and time   Airway Mallampati: IV TM Distance: >3 FB Neck ROM: Full  Mouth opening: Limited Mouth Opening  Dental  (+) Teeth Intact   Pulmonary Current Smoker,    Pulmonary exam normal       Cardiovascular - anginaRhythm:Regular Rate:Normal     Neuro/Psych PSYCHIATRIC DISORDERS Schizophrenia Schizophrenia     GI/Hepatic negative GI ROS, Neg liver ROS,   Endo/Other  negative endocrine ROS  Renal/GU negative Renal ROS  negative genitourinary   Musculoskeletal   Abdominal   Peds  Hematology  (+) anemia ,   Anesthesia Other Findings   Reproductive/Obstetrics                           Anesthesia Physical Anesthesia Plan  ASA: II  Anesthesia Plan: General   Post-op Pain Management:    Induction: Intravenous  Airway Management Planned: Oral ETT  Additional Equipment: None  Intra-op Plan:   Post-operative Plan: Extubation in OR  Informed Consent: I have reviewed the patients History and Physical, chart, labs and discussed the procedure including the risks, benefits and alternatives for the proposed anesthesia with the patient or authorized representative who has indicated his/her understanding and acceptance.   Dental advisory given  Plan Discussed with: CRNA and Surgeon  Anesthesia Plan Comments:        Anesthesia Quick Evaluation

## 2013-09-21 NOTE — Preoperative (Signed)
Beta Blockers   Reason not to administer Beta Blockers:Not Applicable 

## 2013-09-21 NOTE — Anesthesia Postprocedure Evaluation (Signed)
  Anesthesia Post-op Note  Patient: [name]  Procedure(s) Performed: Procedure(s): IRRIGATION AND DEBRIDEMENT SOFT TISSUE WOUND WITH LARGE WOUND VAC PLACEMENT (Left)  Patient Location: PACU  Anesthesia Type:General  Level of Consciousness: awake  Airway and Oxygen Therapy: Patient Spontanous Breathing  Post-op Pain: mild  Post-op Assessment: Post-op Vital signs reviewed  Post-op Vital Signs: Reviewed  Complications: No apparent anesthesia complications

## 2013-09-21 NOTE — Anesthesia Preprocedure Evaluation (Signed)
Anesthesia Evaluation  Patient identified by MRN, date of birth, ID band Patient awake    Reviewed: Allergy & Precautions, H&P , NPO status , Patient's Chart, lab work & pertinent test results, reviewed documented beta blocker date and time   Airway Mallampati: IV TM Distance: >3 FB Neck ROM: Full  Mouth opening: Limited Mouth Opening  Dental  (+) Teeth Intact   Pulmonary Current Smoker,    Pulmonary exam normal       Cardiovascular - anginaRhythm:Regular Rate:Normal     Neuro/Psych PSYCHIATRIC DISORDERS Schizophrenia Schizophrenia     GI/Hepatic negative GI ROS, Neg liver ROS,   Endo/Other  negative endocrine ROS  Renal/GU negative Renal ROS  negative genitourinary   Musculoskeletal   Abdominal   Peds  Hematology  (+) anemia ,   Anesthesia Other Findings   Reproductive/Obstetrics                           Anesthesia Physical Anesthesia Plan  ASA: II  Anesthesia Plan: General   Post-op Pain Management:    Induction: Intravenous  Airway Management Planned: Oral ETT and Video Laryngoscope Planned  Additional Equipment:   Intra-op Plan:   Post-operative Plan:   Informed Consent: I have reviewed the patients History and Physical, chart, labs and discussed the procedure including the risks, benefits and alternatives for the proposed anesthesia with the patient or authorized representative who has indicated his/her understanding and acceptance.   Dental advisory given  Plan Discussed with: Anesthesiologist and CRNA  Anesthesia Plan Comments:         Anesthesia Quick Evaluation

## 2013-09-21 NOTE — Progress Notes (Signed)
I have discussed with the patient the risks and benefits of surgery, including the possibility of failure to resolve infection, nerve injury, vessel injury, wound breakdown, arthritis, symptomatic hardware, DVT/ PE, loss of motion, and need for further surgery among others, including skin grafting or other coverage.  He voiced understanding of these risks and wished to proceed.  Myrene GalasMichael Stanislaw Acton, MD [facility] [phone] (p)

## 2013-09-21 NOTE — Brief Op Note (Signed)
09/19/2013 - 09/21/2013  5:01 PM  PATIENT:  [name] BeachMichael G Carandang  63 y.o. male  PRE-OPERATIVE DIAGNOSIS:  necrotic wound left thigh  POST-OPERATIVE DIAGNOSIS:  necrotic wound left thigh, abscess, circumferential  PROCEDURE:  Procedure(s): INCISION AND DRAINAGE OF THIGH ABCESS IRRIGATION AND DEBRIDEMENT SOFT TISSUE WOUND WITH LARGE WOUND VAC PLACEMENT (Left)  SURGEON:  Surgeon(s) and Role:    * Budd PalmerMichael H Eugean Arnott, MD - Primary  PHYSICIAN ASSISTANT: Montez MoritaKeith Paul, PA-C  ANESTHESIA:   general  I/O:  Total I/O In: -  Out: 1075 [Urine:1075]  SPECIMEN:  Source of Specimen:  ABCESS  DISPOSITION OF SPECIMEN:  To micro  TOURNIQUET:  * No tourniquets in log *  DICTATION: .Other Dictation: Dictation Number [id number]

## 2013-09-22 ENCOUNTER — Encounter (HOSPITAL_COMMUNITY): Payer: Self-pay | Admitting: Orthopedic Surgery

## 2013-09-22 DIAGNOSIS — S32509A Unspecified fracture of unspecified pubis, initial encounter for closed fracture: Secondary | ICD-10-CM

## 2013-09-22 DIAGNOSIS — Z87891 Personal history of nicotine dependence: Secondary | ICD-10-CM

## 2013-09-22 LAB — WOUND CULTURE: Special Requests: NORMAL

## 2013-09-22 LAB — VANCOMYCIN, TROUGH: VANCOMYCIN TR: 17.9 ug/mL (ref 10.0–20.0)

## 2013-09-22 NOTE — Progress Notes (Addendum)
Clinical Social Work Department CLINICAL SOCIAL WORK PLACEMENT NOTE 09/22/2013  Patient:  [name],[name]  Account Number:  [id number] Admit date:  09/19/2013  Clinical Social Worker:  Carren RangLINDSAY PURITZ, LCSWA  Date/time:  09/22/2013 11:41 AM  Clinical Social Work is seeking post-discharge placement for this patient at the following level of care:   SKILLED NURSING   (*CSW will update this form in Epic as items are completed)   09/22/2013  Patient/family provided with [facility] Department of Clinical Social Work's list of facilities offering this level of care within the geographic area requested by the patient (or if unable, by the patient's family).  09/22/2013  Patient/family informed of their freedom to choose among providers that offer the needed level of care, that participate in Medicare, Medicaid or managed care program needed by the patient, have an available bed and are willing to accept the patient.  09/22/2013  Patient/family informed of [facility]' ownership interest in [facility], as well as of the fact that they are under no obligation to receive care at this facility.  PASARR submitted to EDS on  PASARR number received from EDS on 10/11/13  FL2 transmitted to all facilities in geographic area requested by pt/family on  09/22/2013 FL2 transmitted to all facilities within larger geographic area on   Patient informed that his/her managed care company has contracts with or will negotiate with  certain facilities, including the following:     Patient/family informed of bed offers received:   [facility] recommends and patient chooses bed at    [facility]  Patient to be transferred to facility by ambulance  The following physician request were entered in Epic:   Additional Comments:  Maree KrabbeLindsay Puritz, MSW, [employer]CSWA [phone]

## 2013-09-22 NOTE — Progress Notes (Signed)
ANTIBIOTIC CONSULT NOTE   Pharmacy Consult for Vancomycin/Zosyn Indication: Wound infection   No Known Allergies  Patient Measurements: Height: 6' 2.5" (189.2 cm) Weight: 207 lb 14.3 oz (94.3 kg) IBW/kg (Calculated) : 83.35   Vital Signs: Temp: 98 F (36.7 C) (02/20 1021) Temp src: Oral (02/20 1021) BP: 98/52 mmHg (02/20 1021) Pulse Rate: 95 (02/20 1021) Intake/Output from previous day: 02/19 0701 - 02/20 0700 In: 1381.3 [I.V.:1368.8; IV Piggyback:12.5] Out: 2600 [Urine:2400; Drains:200] Intake/Output from this shift: Total I/O In: 480 [P.O.:480] Out: 1825 [Urine:1825]  Labs:  Recent Labs  09/19/13 1505 09/20/13 0345 09/21/13 2023  WBC 11.6* 8.5 8.0  HGB 12.0* 10.6* 10.9*  PLT 369 321 325  CREATININE 0.85 0.84 0.79   Estimated Creatinine Clearance: 114.4 ml/min (by C-G formula based on Cr of 0.79).  Recent Labs  09/22/13 1142  VANCOTROUGH 17.9     Microbiology: Recent Results (from the past 720 hour(s))  MRSA PCR SCREENING     Status: None   Collection Time    08/29/13 11:08 PM      Result Value Ref Range Status   MRSA by PCR NEGATIVE  NEGATIVE Final   Comment:            The GeneXpert MRSA Assay (FDA     approved for NASAL specimens     only), is one component of a     comprehensive MRSA colonization     surveillance program. It is not     intended to diagnose MRSA     infection nor to guide or     monitor treatment for     MRSA infections.  WOUND CULTURE     Status: None   Collection Time    09/19/13  5:38 PM      Result Value Ref Range Status   Specimen Description LEG   Final   Special Requests Normal   Final   Gram Stain     Final   Value: FEW WBC PRESENT,BOTH PMN AND MONONUCLEAR     NO SQUAMOUS EPITHELIAL CELLS SEEN     NO ORGANISMS SEEN     Performed at [facility]   Culture     Final   Value: MODERATE ENTEROBACTER CLOACAE     Performed at [facility]   Report Status 09/22/2013 FINAL   Final   Organism ID,  Bacteria ENTEROBACTER CLOACAE   Final  ANAEROBIC CULTURE     Status: None   Collection Time    09/19/13  5:38 PM      Result Value Ref Range Status   Specimen Description LEG   Final   Special Requests NONE   Final   Gram Stain     Final   Value: MODERATE WBC PRESENT,BOTH PMN AND MONONUCLEAR     NO SQUAMOUS EPITHELIAL CELLS SEEN     NO ORGANISMS SEEN     Performed at [facility]   Culture     Final   Value: NO ANAEROBES ISOLATED; CULTURE IN PROGRESS FOR 5 DAYS     Performed at [facility]   Report Status PENDING   Incomplete  CULTURE, BLOOD (ROUTINE X 2)     Status: None   Collection Time    09/19/13  6:25 PM      Result Value Ref Range Status   Specimen Description BLOOD LEFT FOREARM   Final   Special Requests BOTTLES DRAWN AEROBIC AND ANAEROBIC   Final   Culture  Setup Time     Final   Value: 09/19/2013 20:47     Performed at [facility]   Culture     Final   Value:        BLOOD CULTURE RECEIVED NO GROWTH TO DATE CULTURE WILL BE HELD FOR 5 DAYS BEFORE ISSUING A FINAL NEGATIVE REPORT     Performed at [facility]   Report Status PENDING   Incomplete  CULTURE, BLOOD (ROUTINE X 2)     Status: None   Collection Time    09/19/13  6:30 PM      Result Value Ref Range Status   Specimen Description BLOOD LEFT WRIST   Final   Special Requests BOTTLES DRAWN AEROBIC AND ANAEROBIC 3ML   Final   Culture  Setup Time     Final   Value: 09/19/2013 20:47     Performed at [facility]   Culture     Final   Value:        BLOOD CULTURE RECEIVED NO GROWTH TO DATE CULTURE WILL BE HELD FOR 5 DAYS BEFORE ISSUING A FINAL NEGATIVE REPORT     Performed at [facility]   Report Status PENDING   Incomplete  SURGICAL PCR SCREEN     Status: None   Collection Time    09/21/13  7:32 AM      Result Value Ref Range Status   MRSA, PCR NEGATIVE  NEGATIVE Final   Staphylococcus aureus NEGATIVE  NEGATIVE Final   Comment:            The Xpert SA Assay  (FDA     approved for NASAL specimens     in patients over 62 years of age),     is one component of     a comprehensive surveillance     program.  Test performance has     been validated by [facility] for patients greater     than or equal to 629 year old.     It is not intended     to diagnose infection nor to     guide or monitor treatment.  ANAEROBIC CULTURE     Status: None   Collection Time    09/21/13  3:51 PM      Result Value Ref Range Status   Specimen Description ABSCESS THIGH LEFT   Final   Special Requests PT ON ZOSYN   Final   Gram Stain     Final   Value: ABUNDANT WBC PRESENT,BOTH PMN AND MONONUCLEAR     NO ORGANISMS SEEN     Performed [facility]     Performed at [facility]   Culture PENDING   Incomplete   Report Status PENDING   Incomplete  GRAM STAIN     Status: None   Collection Time    09/21/13  3:51 PM      Result Value Ref Range Status   Specimen Description ABSCESS THIGH LEFT   Final   Special Requests PT ON ZOSYN   Final   Gram Stain     Final   Value: ABUNDANT WBC PRESENT,BOTH PMN AND MONONUCLEAR     NO ORGANISMS SEEN   Report Status 09/21/2013 FINAL   Final  CULTURE, ROUTINE-ABSCESS     Status: None   Collection Time    09/21/13  3:51 PM      Result Value Ref Range Status   Specimen Description  ABSCESS THIGH LEFT   Final   Special Requests PT ON ZOSYN   Final   Gram Stain     Final   Value: ABUNDANT WBC PRESENT,BOTH PMN AND MONONUCLEAR     NO ORGANISMS SEEN     Performed at [facility]     Performed at [facility]   Culture     Final   Value: NO GROWTH 1 DAY     Performed at [facility]   Report Status PENDING   Incomplete    Medical History: Past Medical History  Diagnosis Date  . Schizophrenia   . Complication of anesthesia 08/29/13    difficulty due to large tongue, anterior larynx and limited opening    Medications:  Scheduled:  . bethanechol  25 mg Oral QID  . busPIRone  15  mg Oral BID  . enoxaparin (LOVENOX) injection  40 mg Subcutaneous Q24H  . fluPHENAZine  15 mg Oral QHS  . piperacillin-tazobactam (ZOSYN)  IV  3.375 g Intravenous Q8H  . QUEtiapine  200 mg Oral QHS  . tamsulosin  0.8 mg Oral Daily  . vancomycin  1,000 mg Intravenous Q8H   Infusions:  . 0.9 % NaCl with KCl 20 mEq / L 75 mL/hr at 09/22/13 0845  . lactated ringers 50 mL/hr at 09/21/13 1310   Assessment:  63 yo M with extensive L thigh wound and Right Leg on Vancomycin/Zosyn per Pharmacy  Vancomycin trough therapeutic at 17.9 mg/L  Wound culture enterococcus pan sens except cefazolin   Goal of Therapy:  Vancomycin trough level 15-20 mcg/ml  Plan:  1.) Cont Vancomycin 1 gram IV q8h; Zosyn 3.375 grams IV q8h 2.) F/u blood and wound infections and orthopedic's plan   Woodfin Ganja PharmD Pager #: [phone] 12:45 PM 09/22/2013
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
## 2013-09-22 NOTE — Progress Notes (Signed)
Orthopedic Tech Progress Note Patient Details:  [name] 12-Dec-1950 [id number] OHF applied to bed Patient ID: [name] BeachMichael G [name], male   DOB: 12-Dec-1950, 63 y.o.   MRN: [id number]   [name] 09/22/2013, 10:52 AM
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
## 2013-09-22 NOTE — Progress Notes (Signed)
CSW continuing to follow patient for dc. CSW faxed patient out to other facilities and will provide bed offers to patient as they arrive.   Maree KrabbeLindsay Mary-Anne Polizzi, MSW, Theresia MajorsLCSWA [phone]
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
## 2013-09-23 DIAGNOSIS — S82409B Unspecified fracture of shaft of unspecified fibula, initial encounter for open fracture type I or II: Secondary | ICD-10-CM

## 2013-09-23 LAB — CBC
HCT: 30.3 % — ABNORMAL LOW (ref 39.0–52.0)
HEMOGLOBIN: 9.6 g/dL — AB (ref 13.0–17.0)
MCH: 29.3 pg (ref 26.0–34.0)
MCHC: 31.7 g/dL (ref 30.0–36.0)
MCV: 92.4 fL (ref 78.0–100.0)
Platelets: 285 10*3/uL (ref 150–400)
RBC: 3.28 MIL/uL — AB (ref 4.22–5.81)
RDW: 15 % (ref 11.5–15.5)
WBC: 4.6 10*3/uL (ref 4.0–10.5)

## 2013-09-23 NOTE — ED Provider Notes (Signed)
  Medical screening examination/treatment/procedure(s) were conducted as a shared visit with non-physician practitioner(s) and myself.  I personally evaluated the patient during the encounter.   Please see other note.   Raeford RazorStephen Lilyanna Lunt, MD 09/23/13 [phone]

## 2013-09-23 NOTE — Progress Notes (Signed)
Clinical Child psychotherapistocial Worker (CSW) gave patient bed offers and he chose [facility]. Patient reported that all his belongings clothes, wallet, and checks [facility] in [facility] and he did not have friends or family that could transport his belongings to [facility]. CSW contacted Raquel admissions coordinator at [facility] and she reported that all the patient's items are boxed up and [facility] can assist in getting the patient's items [facility] when the patient goes there. CSW also contacted Environmental education officerN supervisor at [facility] and made her aware that patient has accepted their bed offer. CSW will continue to assist with placement.   Jetta LoutBailey Morgan, LCSWA Weekend CSW [phone]

## 2013-09-23 NOTE — Progress Notes (Signed)
SPORTS MEDICINE AND JOINT REPLACEMENT  [name] SpurlingStephen Lucey, MD   [name] CabalMaurice Evianna Chandran, PA-C [address], [place], [place]  [id number]                             [phone]   PROGRESS NOTE  Subjective:  negative for Chest Pain  negative for Shortness of Breath  negative for Nausea/Vomiting   negative for Calf Pain  negative for Bowel Movement   Tolerating Diet: yes         Patient reports pain as 5 on 0-10 scale.    Objective: Vital signs in last 24 hours:   Patient Vitals for the past 24 hrs:  BP Temp Temp src Pulse Resp SpO2  09/23/13 0524 104/58 mmHg 97.5 F (36.4 C) Oral 97 20 96 %  09/22/13 2239 101/55 mmHg 98.1 F (36.7 C) Oral 94 20 97 %  09/22/13 1716 102/60 mmHg 97.6 F (36.4 C) Oral 93 18 96 %  09/22/13 1326 93/53 mmHg 97.4 F (36.3 C) Oral 100 16 98 %  09/22/13 1021 98/52 mmHg 98 F (36.7 C) Oral 95 16 93 %    @flow {1959:LAST@   Intake/Output from previous day:   02/20 0701 - 02/21 0700 In: 2255 [P.O.:480; I.V.:1775] Out: 4015 [Urine:3915; Drains:100]   Intake/Output this shift:       Intake/Output     02/20 0701 - 02/21 0700 02/21 0701 - 02/22 0700   P.O. 480    I.V. (mL/kg) 1775 (18.8)    IV Piggyback     Total Intake(mL/kg) 2255 (23.9)    Urine (mL/kg/hr) 3915 (1.7)    Drains 100 (0)    Total Output 4015     Net -1760             LABORATORY DATA:  Recent Labs  09/19/13 1505 09/20/13 0345 09/21/13 2023 09/23/13 0448  WBC 11.6* 8.5 8.0 4.6  HGB 12.0* 10.6* 10.9* 9.6*  HCT 36.8* 33.5* 34.4* 30.3*  PLT 369 321 325 285    Recent Labs  09/19/13 1505 09/20/13 0345 09/21/13 2023  NA 135* 132*  --   K 4.2 3.6*  --   CL 97 96  --   CO2 27 25  --   BUN 16 13  --   CREATININE 0.85 0.84 0.79  GLUCOSE 126* 106*  --   CALCIUM 9.0 8.2*  --    Lab Results  Component Value Date   INR 1.02 09/20/2013   INR 1.09 08/30/2013   INR 0.92 08/29/2013    Examination:  General appearance: alert, cooperative and no distress Extremities: Homans  sign is negative, no sign of DVT  Wound Exam: clean, dry, intact   Drainage:  None: wound tissue dry  Motor Exam: EHL and FHL Intact  Sensory Exam: normal normal   Assessment:    2 Days Post-Op  Procedure(s) (LRB): IRRIGATION AND DEBRIDEMENT SOFT TISSUE WOUND WITH LARGE WOUND VAC PLACEMENT (Left)  ADDITIONAL DIAGNOSIS:  Principal Problem:   Complicated open wound of left thigh Active Problems:   Schizophrenia   Cellulitis and abscess of leg   Tobacco abuse, in remission  Acute Blood Loss Anemia   Plan: Physical Therapy as ordered Touch Down Weight Bearing (TDWB)   Plan to I&D again on Monday        [name] 09/23/2013, 8:49 AM
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
## 2013-09-24 LAB — ANAEROBIC CULTURE
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
## 2013-09-24 NOTE — Progress Notes (Signed)
SPORTS MEDICINE AND JOINT REPLACEMENT  [name] SpurlingStephen Lucey, MD   [name] CabalMaurice Aideliz Garmany, PA-C [address], [place], [place]  [id number]                             [phone]   PROGRESS NOTE  Subjective:  negative for Chest Pain  negative for Shortness of Breath  negative for Nausea/Vomiting   negative for Calf Pain  negative for Bowel Movement   Tolerating Diet: yes         Patient reports pain as 5 on 0-10 scale.    Objective: Vital signs in last 24 hours:   Patient Vitals for the past 24 hrs:  BP Temp Temp src Pulse Resp SpO2  09/24/13 0508 111/59 mmHg 98 F (36.7 C) Oral 93 22 99 %  09/23/13 2133 107/66 mmHg 97.5 F (36.4 C) Oral 90 20 99 %  09/23/13 1410 127/56 mmHg 97.9 F (36.6 C) Oral 99 20 93 %    @flow {1959:LAST@   Intake/Output from previous day:   02/21 0701 - 02/22 0700 In: 2385 [P.O.:1200; I.V.:1185] Out: 1610 [Urine:1500; Drains:110]   Intake/Output this shift:   02/22 0701 - 02/22 1900 In: -  Out: 400 [Urine:400]   Intake/Output     02/21 0701 - 02/22 0700 02/22 0701 - 02/23 0700   P.O. 1200    I.V. (mL/kg) 1185 (12.6)    Total Intake(mL/kg) 2385 (25.3)    Urine (mL/kg/hr) 1500 (0.7) 400 (3.5)   Drains 110 (0)    Total Output 1610 400   Net +775 -400           LABORATORY DATA:  Recent Labs  09/19/13 1505 09/20/13 0345 09/21/13 2023 09/23/13 0448  WBC 11.6* 8.5 8.0 4.6  HGB 12.0* 10.6* 10.9* 9.6*  HCT 36.8* 33.5* 34.4* 30.3*  PLT 369 321 325 285    Recent Labs  09/19/13 1505 09/20/13 0345 09/21/13 2023  NA 135* 132*  --   K 4.2 3.6*  --   CL 97 96  --   CO2 27 25  --   BUN 16 13  --   CREATININE 0.85 0.84 0.79  GLUCOSE 126* 106*  --   CALCIUM 9.0 8.2*  --    Lab Results  Component Value Date   INR 1.02 09/20/2013   INR 1.09 08/30/2013   INR 0.92 08/29/2013    Examination:  General appearance: alert, cooperative and no distress Extremities: Homans sign is negative, no sign of DVT  Wound Exam: clean, dry, intact    Drainage:  None: wound tissue dry  Motor Exam: EHL and FHL Intact  Sensory Exam: Deep Peroneal normal   Assessment:    3 Days Post-Op  Procedure(s) (LRB): IRRIGATION AND DEBRIDEMENT SOFT TISSUE WOUND WITH LARGE WOUND VAC PLACEMENT (Left)  ADDITIONAL DIAGNOSIS:  Principal Problem:   Complicated open wound of left thigh Active Problems:   Schizophrenia   Cellulitis and abscess of leg   Tobacco abuse, in remission  Acute Blood Loss Anemia   Plan: Physical Therapy as ordered Touch Down Weight Bearing (TDWB)    I&D on mon or tues         Maegan Buller 09/24/2013, 8:14 AM

## 2013-09-25 DIAGNOSIS — D649 Anemia, unspecified: Secondary | ICD-10-CM

## 2013-09-25 LAB — CULTURE, BLOOD (ROUTINE X 2)
CULTURE: NO GROWTH
CULTURE: NO GROWTH

## 2013-09-25 LAB — CULTURE, ROUTINE-ABSCESS

## 2013-09-25 MED ORDER — DEXTROSE 5 % IV SOLN
2.0000 g | INTRAVENOUS | Status: DC
  Administered 2013-09-25 – 2013-10-08 (×13): 2 g via INTRAVENOUS
  Filled 2013-09-25 (×17): qty 2
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
## 2013-09-25 NOTE — Social Work (Signed)
Patient discussed in unit rounds this morning- plan is to return to the OR for an I&D before d/c. Patient and family have chosen to go to [facility] at d/c.  CSW will follow along- Reece LevyJanet Junetta Hearn, MSW, Theresia MajorsLCSWA [phone]
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
## 2013-09-26 ENCOUNTER — Encounter (HOSPITAL_COMMUNITY): Payer: Self-pay | Admitting: Certified Registered"

## 2013-09-26 ENCOUNTER — Encounter (HOSPITAL_COMMUNITY): Payer: Medicare Other | Admitting: Certified Registered"

## 2013-09-26 ENCOUNTER — Encounter (HOSPITAL_COMMUNITY)
Admission: EM | Disposition: A | Discharge status: Skilled Nursing Facility | Payer: Self-pay | Source: Home / Self Care | Attending: Internal Medicine

## 2013-09-26 ENCOUNTER — Inpatient Hospital Stay (HOSPITAL_COMMUNITY): Payer: Medicare Other | Admitting: Certified Registered"

## 2013-09-26 DIAGNOSIS — S71009A Unspecified open wound, unspecified hip, initial encounter: Secondary | ICD-10-CM | POA: Diagnosis not present

## 2013-09-26 DIAGNOSIS — D72829 Elevated white blood cell count, unspecified: Secondary | ICD-10-CM

## 2013-09-26 DIAGNOSIS — L089 Local infection of the skin and subcutaneous tissue, unspecified: Secondary | ICD-10-CM | POA: Diagnosis not present

## 2013-09-26 DIAGNOSIS — B9689 Other specified bacterial agents as the cause of diseases classified elsewhere: Secondary | ICD-10-CM

## 2013-09-26 DIAGNOSIS — L02419 Cutaneous abscess of limb, unspecified: Secondary | ICD-10-CM

## 2013-09-26 DIAGNOSIS — F209 Schizophrenia, unspecified: Secondary | ICD-10-CM

## 2013-09-26 DIAGNOSIS — L03119 Cellulitis of unspecified part of limb: Secondary | ICD-10-CM

## 2013-09-26 DIAGNOSIS — A498 Other bacterial infections of unspecified site: Secondary | ICD-10-CM

## 2013-09-26 HISTORY — PX: I&D EXTREMITY: SHX5045

## 2013-09-26 LAB — ANAEROBIC CULTURE

## 2013-09-26 LAB — CBC WITH DIFFERENTIAL/PLATELET
Basophils Absolute: 0.1 10*3/uL (ref 0.0–0.1)
Basophils Relative: 1 % (ref 0–1)
EOS PCT: 7 % — AB (ref 0–5)
Eosinophils Absolute: 0.6 10*3/uL (ref 0.0–0.7)
HCT: 35.5 % — ABNORMAL LOW (ref 39.0–52.0)
Hemoglobin: 11.4 g/dL — ABNORMAL LOW (ref 13.0–17.0)
Lymphocytes Relative: 32 % (ref 12–46)
Lymphs Abs: 2.8 10*3/uL (ref 0.7–4.0)
MCH: 29.5 pg (ref 26.0–34.0)
MCHC: 32.1 g/dL (ref 30.0–36.0)
MCV: 91.7 fL (ref 78.0–100.0)
MONOS PCT: 11 % (ref 3–12)
Monocytes Absolute: 1 10*3/uL (ref 0.1–1.0)
NEUTROS ABS: 4.2 10*3/uL (ref 1.7–7.7)
Neutrophils Relative %: 49 % (ref 43–77)
Platelets: 346 10*3/uL (ref 150–400)
RBC: 3.87 MIL/uL — AB (ref 4.22–5.81)
RDW: 15.5 % (ref 11.5–15.5)
WBC: 8.7 10*3/uL (ref 4.0–10.5)

## 2013-09-26 SURGERY — IRRIGATION AND DEBRIDEMENT EXTREMITY
Anesthesia: General | Site: Leg Upper | Laterality: Left

## 2013-09-26 MED ORDER — ONDANSETRON HCL 4 MG/2ML IJ SOLN
INTRAMUSCULAR | Status: DC | PRN
  Administered 2013-09-26: 4 mg via INTRAVENOUS

## 2013-09-26 MED ORDER — FENTANYL CITRATE 0.05 MG/ML IJ SOLN
25.0000 ug | INTRAMUSCULAR | Status: DC | PRN
  Administered 2013-09-26: 50 ug via INTRAVENOUS
  Administered 2013-09-26: 25 ug via INTRAVENOUS

## 2013-09-26 MED ORDER — LIDOCAINE HCL (CARDIAC) 20 MG/ML IV SOLN
INTRAVENOUS | Status: AC
  Filled 2013-09-26: qty 5

## 2013-09-26 MED ORDER — LACTATED RINGERS IV SOLN
INTRAVENOUS | Status: DC
  Administered 2013-09-26 – 2013-10-03 (×3): via INTRAVENOUS

## 2013-09-26 MED ORDER — FENTANYL CITRATE 0.05 MG/ML IJ SOLN
INTRAMUSCULAR | Status: AC
  Filled 2013-09-26: qty 5

## 2013-09-26 MED ORDER — SODIUM CHLORIDE 0.9 % IR SOLN
Status: DC | PRN
  Administered 2013-09-26: 1000 mL
  Administered 2013-09-26: 4000 mL

## 2013-09-26 MED ORDER — PROPOFOL 10 MG/ML IV BOLUS
INTRAVENOUS | Status: DC | PRN
  Administered 2013-09-26: 160 mg via INTRAVENOUS
  Administered 2013-09-26: 20 mg via INTRAVENOUS

## 2013-09-26 MED ORDER — FENTANYL CITRATE 0.05 MG/ML IJ SOLN
INTRAMUSCULAR | Status: DC | PRN
  Administered 2013-09-26: 50 ug via INTRAVENOUS

## 2013-09-26 MED ORDER — LACTATED RINGERS IV SOLN
INTRAVENOUS | Status: DC | PRN
  Administered 2013-09-26: 08:00:00 via INTRAVENOUS

## 2013-09-26 MED ORDER — FENTANYL CITRATE 0.05 MG/ML IJ SOLN
INTRAMUSCULAR | Status: AC
  Filled 2013-09-26: qty 2

## 2013-09-26 MED ORDER — LIDOCAINE HCL (CARDIAC) 20 MG/ML IV SOLN
INTRAVENOUS | Status: DC | PRN
  Administered 2013-09-26: 40 mg via INTRAVENOUS

## 2013-09-26 MED ORDER — PROPOFOL 10 MG/ML IV BOLUS
INTRAVENOUS | Status: AC
  Filled 2013-09-26: qty 20

## 2013-09-26 MED ORDER — MIDAZOLAM HCL 2 MG/2ML IJ SOLN
INTRAMUSCULAR | Status: AC
  Filled 2013-09-26: qty 2

## 2013-09-26 SURGICAL SUPPLY — 54 items
BANDAGE GAUZE ELAST BULKY 4 IN (GAUZE/BANDAGES/DRESSINGS) ×6 IMPLANT
BLADE SURG 10 STRL SS (BLADE) ×3 IMPLANT
BNDG COHESIVE 4X5 TAN STRL (GAUZE/BANDAGES/DRESSINGS) ×3 IMPLANT
BNDG GAUZE STRTCH 6 (GAUZE/BANDAGES/DRESSINGS) ×9 IMPLANT
BRUSH SCRUB DISP (MISCELLANEOUS) ×6 IMPLANT
CANISTER WOUND CARE 500ML ATS (WOUND CARE) ×2 IMPLANT
CLOTH BEACON ORANGE TIMEOUT ST (SAFETY) ×3 IMPLANT
CONNECTOR Y ATS VAC SYSTEM (MISCELLANEOUS) ×2 IMPLANT
COVER SURGICAL LIGHT HANDLE (MISCELLANEOUS) ×6 IMPLANT
DRAPE U-SHAPE 47X51 STRL (DRAPES) ×3 IMPLANT
DRSG ADAPTIC 3X8 NADH LF (GAUZE/BANDAGES/DRESSINGS) ×3 IMPLANT
DRSG MEPITEL 4X7.2 (GAUZE/BANDAGES/DRESSINGS) ×6 IMPLANT
DRSG VAC ATS LRG SENSATRAC (GAUZE/BANDAGES/DRESSINGS) ×4 IMPLANT
ELECT CAUTERY BLADE 6.4 (BLADE) ×2 IMPLANT
ELECT REM PT RETURN 9FT ADLT (ELECTROSURGICAL) ×3
ELECTRODE REM PT RTRN 9FT ADLT (ELECTROSURGICAL) IMPLANT
GLOVE BIO SURGEON STRL SZ7.5 (GLOVE) ×3 IMPLANT
GLOVE BIO SURGEON STRL SZ8 (GLOVE) ×3 IMPLANT
GLOVE BIOGEL PI IND STRL 7.5 (GLOVE) ×1 IMPLANT
GLOVE BIOGEL PI IND STRL 8 (GLOVE) ×1 IMPLANT
GLOVE BIOGEL PI INDICATOR 7.5 (GLOVE) ×2
GLOVE BIOGEL PI INDICATOR 8 (GLOVE) ×2
GOWN STRL REUS W/ TWL LRG LVL3 (GOWN DISPOSABLE) ×2 IMPLANT
GOWN STRL REUS W/ TWL XL LVL3 (GOWN DISPOSABLE) ×1 IMPLANT
GOWN STRL REUS W/TWL LRG LVL3 (GOWN DISPOSABLE) ×6
GOWN STRL REUS W/TWL XL LVL3 (GOWN DISPOSABLE) ×3
HANDPIECE INTERPULSE COAX TIP (DISPOSABLE) ×3
KIT BASIN OR (CUSTOM PROCEDURE TRAY) ×3 IMPLANT
KIT ROOM TURNOVER OR (KITS) ×3 IMPLANT
MANIFOLD NEPTUNE II (INSTRUMENTS) ×3 IMPLANT
MATRIX SURGICAL PSMX 10X15CM (Tissue) ×2 IMPLANT
MICROMATRIX 1000MG (Tissue) ×3 IMPLANT
NS IRRIG 1000ML POUR BTL (IV SOLUTION) ×5 IMPLANT
PACK ORTHO EXTREMITY (CUSTOM PROCEDURE TRAY) ×3 IMPLANT
PAD ARMBOARD 7.5X6 YLW CONV (MISCELLANEOUS) ×6 IMPLANT
PADDING CAST COTTON 6X4 STRL (CAST SUPPLIES) ×3 IMPLANT
SET CYSTO W/LG BORE CLAMP LF (SET/KITS/TRAYS/PACK) ×2 IMPLANT
SET HNDPC FAN SPRY TIP SCT (DISPOSABLE) IMPLANT
SOLUTION PARTIC MCRMTRX 1000MG (Tissue) IMPLANT
SPONGE GAUZE 4X4 12PLY (GAUZE/BANDAGES/DRESSINGS) ×3 IMPLANT
SPONGE LAP 18X18 X RAY DECT (DISPOSABLE) ×3 IMPLANT
STOCKINETTE IMPERVIOUS 9X36 MD (GAUZE/BANDAGES/DRESSINGS) ×3 IMPLANT
SUT ETHILON 3 0 PS 1 (SUTURE) ×2 IMPLANT
SUT PDS AB 2-0 CT1 27 (SUTURE) IMPLANT
SUT PROLENE 4 0 SH DA (SUTURE) ×2 IMPLANT
TAPE CLOTH SURG 4X10 WHT LF (GAUZE/BANDAGES/DRESSINGS) ×2 IMPLANT
TOWEL OR 17X24 6PK STRL BLUE (TOWEL DISPOSABLE) ×5 IMPLANT
TOWEL OR 17X26 10 PK STRL BLUE (TOWEL DISPOSABLE) ×6 IMPLANT
TUBE ANAEROBIC SPECIMEN COL (MISCELLANEOUS) IMPLANT
TUBE CONNECTING 12'X1/4 (SUCTIONS) ×1
TUBE CONNECTING 12X1/4 (SUCTIONS) ×2 IMPLANT
UNDERPAD 30X30 INCONTINENT (UNDERPADS AND DIAPERS) ×3 IMPLANT
WATER STERILE IRR 1000ML POUR (IV SOLUTION) ×3 IMPLANT
YANKAUER SUCT BULB TIP NO VENT (SUCTIONS) ×3 IMPLANT
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
## 2013-09-26 NOTE — Brief Op Note (Signed)
09/19/2013 - 09/26/2013  10:24 AM  PATIENT:  [name]  63 y.o. male  PRE-OPERATIVE DIAGNOSIS:  LEFT LEG CELLULITIS, full thickness skin loss   POST-OPERATIVE DIAGNOSIS:  LEFT LEG CELLULITIS, full thickness skin loss  PROCEDURE:  Procedure(s): 1. LEFT IRRIGATION AND DEBRIDEMENT EXTREMITY, skin and subcutaneous tissue and muscle 2. Application of ACell, including sheet 3. Application of large wound vac  SURGEON:  Surgeon(s) and Role:    * Budd PalmerMichael H Jatoya Armbrister, MD - Primary  PHYSICIAN ASSISTANT: Montez MoritaKeith Paul, PA-C  ANESTHESIA:   general  I/O:  Total I/O In: 700 [I.V.:700] Out: -   SPECIMEN:  No Specimen  TOURNIQUET:  * No tourniquets in log *  DICTATION: .Other Dictation: Dictation Number [id number]
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
## 2013-09-26 NOTE — Progress Notes (Addendum)
OT Cancellation Note  Patient Details Name: [name] MRN: [id number] DOB: [birth year]   Cancelled Treatment:    Reason Eval/Treat Not Completed: Patient at procedure or test/ unavailable pt. Just out of sx. For L lower extremity irrigation and debridement with wound vac placement.  Will check back as pt. Able to participate.   Robet LeuMorris, Lewis Grivas Lorraine, COTA/L 09/26/2013, 2:38 PM
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
## 2013-09-26 NOTE — Anesthesia Postprocedure Evaluation (Signed)
  Anesthesia Post-op Note  Patient: [name]  Procedure(s) Performed: Procedure(s): LEFT IRRIGATION AND DEBRIDEMENT EXTREMITY/WOUND VAC (Left)  Patient Location: PACU  Anesthesia Type:General  Level of Consciousness: awake  Airway and Oxygen Therapy: Patient Spontanous Breathing  Post-op Pain: mild  Post-op Assessment: Post-op Vital signs reviewed  Post-op Vital Signs: Reviewed  Complications: No apparent anesthesia complications
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
## 2013-09-26 NOTE — Transfer of Care (Signed)
Immediate Anesthesia Transfer of Care Note  Patient: [name]  Procedure(s) Performed: Procedure(s): LEFT IRRIGATION AND DEBRIDEMENT EXTREMITY/WOUND VAC (Left)  Patient Location: PACU  Anesthesia Type:General  Level of Consciousness: awake, alert , oriented and patient cooperative  Airway & Oxygen Therapy: Patient Spontanous Breathing and Patient connected to nasal cannula oxygen  Post-op Assessment: Report given to PACU RN and Post -op Vital signs reviewed and stable  Post vital signs: Reviewed and stable  Complications: No apparent anesthesia complications

## 2013-09-26 NOTE — Anesthesia Procedure Notes (Signed)
Procedure Name: LMA Insertion Date/Time: 09/26/2013 8:11 AM Performed by: Arlice ColtMANESS, Calyx Hawker B Pre-anesthesia Checklist: Patient identified, Emergency Drugs available, Suction available, Patient being monitored and Timeout performed Patient Re-evaluated:Patient Re-evaluated prior to inductionOxygen Delivery Method: Circle system utilized Preoxygenation: Pre-oxygenation with 100% oxygen Intubation Type: IV induction LMA: LMA with gastric port inserted LMA Size: 5.0 Number of attempts: 1 Placement Confirmation: positive ETCO2 and breath sounds checked- equal and bilateral Tube secured with: Tape Dental Injury: Teeth and Oropharynx as per pre-operative assessment

## 2013-09-26 NOTE — Progress Notes (Signed)
I have seen and examined the patient. I agree with the findings above.  I discussed with the patient the risks and benefits of surgery for his enormous skin and soft tissue defects, including the possibility of infection, nerve injury, vessel injury, wound breakdown, loss of motion, and need for further surgery among others.  We also specifically discussed that complications could lead to amputation.  He understood these risks and wished to proceed.   [name],[name] H, MD 09/26/2013 7:57 AM

## 2013-09-27 ENCOUNTER — Encounter (HOSPITAL_COMMUNITY): Payer: Self-pay | Admitting: Orthopedic Surgery
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
## 2013-09-27 NOTE — Addendum Note (Signed)
Addendum created 09/27/13 1828 by Judie Petitharlene Edwards, MD   Modules edited: Anesthesia Responsible Staff
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
## 2013-09-27 NOTE — Progress Notes (Signed)
Patient ID: [name], male   DOB: [birth year], 63 y.o.   MRN: [id number]         [facility]    Date of Admission:  09/19/2013   Total days of antibiotics 8        Day 3 ceftriaxone         Principal Problem:   Cellulitis and abscess of leg Active Problems:   Pelvic fracture   Open right fibular fracture   Complicated open wound of left thigh   Schizophrenia   Tobacco abuse, in remission   . bethanechol  25 mg Oral QID  . busPIRone  15 mg Oral BID  . cefTRIAXone (ROCEPHIN)  IV  2 g Intravenous Q24H  . enoxaparin (LOVENOX) injection  40 mg Subcutaneous Q24H  . fluPHENAZine  15 mg Oral QHS  . QUEtiapine  200 mg Oral QHS  . tamsulosin  0.8 mg Oral Daily    Subjective: He denies pain.   Past Medical History  Diagnosis Date  . Schizophrenia   . Complication of anesthesia 08/29/13    difficulty due to large tongue, anterior larynx and limited opening    History  Substance Use Topics  . Smoking status: Current Every Day Smoker    Types: Cigarettes  . Smokeless tobacco: Never Used  . Alcohol Use: Yes    Family History  Problem Relation Age of Onset  . Obsessive Compulsive Disorder Father     No Known Allergies  Objective: Temp:  [98.1 F (36.7 C)-98.5 F (36.9 C)] 98.3 F (36.8 C) (02/25 0534) Pulse Rate:  [88-95] 95 (02/25 0534) Resp:  [12-16] 16 (02/25 0534) BP: (101-126)/(60-65) 101/60 mmHg (02/25 0534) SpO2:  [96 %-100 %] 96 % (02/25 0534)  General: He is resting quietly in His left thigh wound is wrapped  Lab Results Lab Results  Component Value Date   WBC 8.7 09/26/2013   HGB 11.4* 09/26/2013   HCT 35.5* 09/26/2013   MCV 91.7 09/26/2013   PLT 346 09/26/2013    Lab Results  Component Value Date   CREATININE 0.79 09/21/2013   BUN 13 09/20/2013   NA 132* 09/20/2013   K 3.6* 09/20/2013   CL 96 09/20/2013   CO2 25 09/20/2013    Lab Results  Component Value Date   ALT 15 09/20/2013   AST 21 09/20/2013   ALKPHOS 147*  09/20/2013   BILITOT 0.8 09/20/2013      Microbiology: Recent Results (from the past 240 hour(s))  WOUND CULTURE     Status: None   Collection Time    09/19/13  5:38 PM      Result Value Ref Range Status   Specimen Description LEG   Final   Special Requests Normal   Final   Gram Stain     Final   Value: FEW WBC PRESENT,BOTH PMN AND MONONUCLEAR     NO SQUAMOUS EPITHELIAL CELLS SEEN     NO ORGANISMS SEEN     Performed at [facility]   Culture     Final   Value: MODERATE ENTEROBACTER CLOACAE     Performed at [facility]   Report Status 09/22/2013 FINAL   Final   Organism ID, Bacteria ENTEROBACTER CLOACAE   Final  ANAEROBIC CULTURE     Status: None   Collection Time    09/19/13  5:38 PM      Result Value Ref Range Status   Specimen Description LEG   Final  Special Requests NONE   Final   Gram Stain     Final   Value: MODERATE WBC PRESENT,BOTH PMN AND MONONUCLEAR     NO SQUAMOUS EPITHELIAL CELLS SEEN     NO ORGANISMS SEEN     Performed at [facility]   Culture     Final   Value: NO ANAEROBES ISOLATED     Performed at [facility]   Report Status 09/24/2013 FINAL   Final  CULTURE, BLOOD (ROUTINE X 2)     Status: None   Collection Time    09/19/13  6:25 PM      Result Value Ref Range Status   Specimen Description BLOOD LEFT FOREARM   Final   Special Requests BOTTLES DRAWN AEROBIC AND ANAEROBIC 5ML   Final   Culture  Setup Time     Final   Value: 09/19/2013 20:47     Performed at [facility]   Culture     Final   Value: NO GROWTH 5 DAYS     Performed at [facility]   Report Status 09/25/2013 FINAL   Final  CULTURE, BLOOD (ROUTINE X 2)     Status: None   Collection Time    09/19/13  6:30 PM      Result Value Ref Range Status   Specimen Description BLOOD LEFT WRIST   Final   Special Requests BOTTLES DRAWN AEROBIC AND ANAEROBIC 3ML   Final   Culture  Setup Time     Final   Value: 09/19/2013 20:47     Performed at  [facility]   Culture     Final   Value: NO GROWTH 5 DAYS     Performed at [facility]   Report Status 09/25/2013 FINAL   Final  SURGICAL PCR SCREEN     Status: None   Collection Time    09/21/13  7:32 AM      Result Value Ref Range Status   MRSA, PCR NEGATIVE  NEGATIVE Final   Staphylococcus aureus NEGATIVE  NEGATIVE Final   Comment:            The Xpert SA Assay (FDA     approved for NASAL specimens     in patients over 62 years of age),     is one component of     a comprehensive surveillance     program.  Test performance has     been validated by [facility] for patients greater     than or equal to 62 year old.     It is not intended     to diagnose infection nor to     guide or monitor treatment.  ANAEROBIC CULTURE     Status: None   Collection Time    09/21/13  3:51 PM      Result Value Ref Range Status   Specimen Description ABSCESS THIGH LEFT   Final   Special Requests PT ON ZOSYN   Final   Gram Stain     Final   Value: ABUNDANT WBC PRESENT,BOTH PMN AND MONONUCLEAR     NO ORGANISMS SEEN     Performed at [facility]     Performed at [facility]   Culture     Final   Value: NO ANAEROBES ISOLATED     Performed at [facility]   Report Status 09/26/2013 FINAL   Final  GRAM STAIN  Status: None   Collection Time    09/21/13  3:51 PM      Result Value Ref Range Status   Specimen Description ABSCESS THIGH LEFT   Final   Special Requests PT ON ZOSYN   Final   Gram Stain     Final   Value: ABUNDANT WBC PRESENT,BOTH PMN AND MONONUCLEAR     NO ORGANISMS SEEN   Report Status 09/21/2013 FINAL   Final  CULTURE, ROUTINE-ABSCESS     Status: None   Collection Time    09/21/13  3:51 PM      Result Value Ref Range Status   Specimen Description ABSCESS THIGH LEFT   Final   Special Requests PT ON ZOSYN   Final   Gram Stain     Final   Value: ABUNDANT WBC PRESENT,BOTH PMN AND MONONUCLEAR     NO ORGANISMS SEEN     Performed  at [facility]     Performed at [facility]   Culture     Final   Value: FEW ENTEROBACTER CLOACAE     FEW ESCHERICHIA COLI     Performed at [facility]   Report Status 09/25/2013 FINAL   Final   Organism ID, Bacteria ENTEROBACTER CLOACAE   Final   Organism ID, Bacteria ESCHERICHIA COLI   Final    Assessment: He seems to have improved after debridement and antibiotic therapy for gram-negative rod soft tissue infection of his left thigh wound. I would plan on at least 21 days of total antibiotic therapy. I would continue ceftriaxone while he is here but consider conversion to oral trimethoprim sulfamethoxazole, one double strength tablet twice daily upon discharge.  Plan: 1. Continue ceftriaxone for now 2. I will sign off for now but please call if I can be of further assistance  Cliffton Asters, MD [facility] [phone] pager   [phone] cell 09/27/2013, 12:00 PM
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
## 2013-09-28 LAB — BASIC METABOLIC PANEL
BUN: 11 mg/dL (ref 6–23)
CALCIUM: 9 mg/dL (ref 8.4–10.5)
CO2: 27 mEq/L (ref 19–32)
CREATININE: 0.8 mg/dL (ref 0.50–1.35)
Chloride: 102 mEq/L (ref 96–112)
GLUCOSE: 92 mg/dL (ref 70–99)
POTASSIUM: 4.1 meq/L (ref 3.7–5.3)
Sodium: 140 mEq/L (ref 137–147)

## 2013-09-28 NOTE — Social Work (Signed)
[facility]F bed [facility]- will update FL2 to reflect updates including I&D and VAC.  Reece LevyJanet Suzzette Gasparro, MSW, Theresia MajorsLCSWA [phone]
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
## 2013-09-29 LAB — CBC
HCT: 32.9 % — ABNORMAL LOW (ref 39.0–52.0)
HEMOGLOBIN: 10.8 g/dL — AB (ref 13.0–17.0)
MCH: 29.9 pg (ref 26.0–34.0)
MCHC: 32.8 g/dL (ref 30.0–36.0)
MCV: 91.1 fL (ref 78.0–100.0)
Platelets: ADEQUATE 10*3/uL (ref 150–400)
RBC: 3.61 MIL/uL — ABNORMAL LOW (ref 4.22–5.81)
RDW: 15.6 % — AB (ref 11.5–15.5)
WBC: 8.4 10*3/uL (ref 4.0–10.5)

## 2013-09-29 LAB — BASIC METABOLIC PANEL
BUN: 9 mg/dL (ref 6–23)
CO2: 28 mEq/L (ref 19–32)
Calcium: 9 mg/dL (ref 8.4–10.5)
Chloride: 102 mEq/L (ref 96–112)
Creatinine, Ser: 0.85 mg/dL (ref 0.50–1.35)
Glucose, Bld: 95 mg/dL (ref 70–99)
Potassium: 4.9 mEq/L (ref 3.7–5.3)
SODIUM: 138 meq/L (ref 137–147)
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
## 2013-09-30 LAB — CBC
HCT: 34.3 % — ABNORMAL LOW (ref 39.0–52.0)
Hemoglobin: 10.9 g/dL — ABNORMAL LOW (ref 13.0–17.0)
MCH: 29.1 pg (ref 26.0–34.0)
MCHC: 31.8 g/dL (ref 30.0–36.0)
MCV: 91.5 fL (ref 78.0–100.0)
Platelets: 342 10*3/uL (ref 150–400)
RBC: 3.75 MIL/uL — AB (ref 4.22–5.81)
RDW: 15.4 % (ref 11.5–15.5)
WBC: 5.6 10*3/uL (ref 4.0–10.5)

## 2013-09-30 LAB — BASIC METABOLIC PANEL
BUN: 9 mg/dL (ref 6–23)
CALCIUM: 8.8 mg/dL (ref 8.4–10.5)
CO2: 27 meq/L (ref 19–32)
Chloride: 100 mEq/L (ref 96–112)
Creatinine, Ser: 0.76 mg/dL (ref 0.50–1.35)
GFR calc Af Amer: >90 mL/min (ref >90)
GFR calc non Af Amer: >90 mL/min (ref >90)
Glucose, Bld: 94 mg/dL (ref 70–99)
POTASSIUM: 4 meq/L (ref 3.7–5.3)
SODIUM: 138 meq/L (ref 137–147)

## 2013-09-30 NOTE — Progress Notes (Signed)
I participated in the care of this patient and agree with the above history, physical and evaluation. I performed a review of the history and a physical exam as detailed   Kasaundra Fahrney Daniel Joshua Zeringue MD  
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
## 2013-09-30 NOTE — Progress Notes (Signed)
Patient ID: [name], male   DOB: 1950/08/17, 63 y.o.   MRN: [id number]     Subjective:  Patient reports pain as mild.  Pain well controlled with po meds no CP or SOB  Objective:   VITALS:   Filed Vitals:   09/29/13 0605 09/29/13 1329 09/29/13 2110 09/30/13 0521  BP: 114/56 112/64 128/69 119/56  Pulse: 93 94 96 98  Temp: 97.9 F (36.6 C) 98.3 F (36.8 C) 97.8 F (36.6 C) 98.1 F (36.7 C)  TempSrc: Oral Oral Oral Oral  Resp: 15 16 16 17   Height:      Weight:      SpO2: 95% 100% 98% 96%    ABD soft Sensation intact distally Dorsiflexion/Plantar flexion intact Incision: dressing C/D/I and scant drainage   Lab Results  Component Value Date   WBC 5.6 09/30/2013   HGB 10.9* 09/30/2013   HCT 34.3* 09/30/2013   MCV 91.5 09/30/2013   PLT 342 09/30/2013     Assessment/Plan: 4 Days Post-Op   Principal Problem:   Cellulitis and abscess of leg Active Problems:   Pelvic fracture   Open right fibular fracture   Complicated open wound of left thigh   Schizophrenia   Tobacco abuse, in remission   Advance diet Up with therapy Plan for surg. With Dr [name] on Monday Continue wound vac   [name] 09/30/2013, 9:57 AM   [name] Ranaimothy Murphy MD [phone]

## 2013-10-01 LAB — GLUCOSE, CAPILLARY: GLUCOSE-CAPILLARY: 128 mg/dL — AB (ref 70–99)

## 2013-10-01 NOTE — Progress Notes (Signed)
Patient ID: [name], male   DOB: 1951-01-07, 63 y.o.   MRN: [id number]     Subjective:  Patient reports pain as mild to moderate.  Patient sitting up in bed in no acute distress.  Objective:   VITALS:   Filed Vitals:   09/30/13 0521 09/30/13 1429 09/30/13 2222 10/01/13 0624  BP: 119/56 129/63 128/58 130/62  Pulse: 98 97 94 108  Temp: 98.1 F (36.7 C) 97.5 F (36.4 C) 97.7 F (36.5 C) 97.6 F (36.4 C)  TempSrc: Oral Oral Oral Oral  Resp: 17 17 16 17   Height:      Weight:      SpO2: 96% 97% 98% 97%    ABD soft Sensation intact distally Dorsiflexion/Plantar flexion intact Incision: scant drainage   Lab Results  Component Value Date   WBC 5.6 09/30/2013   HGB 10.9* 09/30/2013   HCT 34.3* 09/30/2013   MCV 91.5 09/30/2013   PLT 342 09/30/2013     Assessment/Plan: 5 Days Post-Op   Principal Problem:   Cellulitis and abscess of leg Active Problems:   Pelvic fracture   Open right fibular fracture   Complicated open wound of left thigh   Schizophrenia   Tobacco abuse, in remission   Advance diet until NPO after midnight for surgery tomorrow  Surg planned with Dr Carola FrostHandy tomorrow  Vac in place and functioning     [name] KhanDOUGLAS [name] 10/01/2013, 9:20 AM   [name] Ranaimothy Murphy MD [phone]
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
## 2013-10-02 ENCOUNTER — Encounter (HOSPITAL_COMMUNITY): Payer: Medicare Other | Admitting: Anesthesiology

## 2013-10-02 ENCOUNTER — Encounter (HOSPITAL_COMMUNITY): Payer: Self-pay | Admitting: Certified Registered Nurse Anesthetist

## 2013-10-02 ENCOUNTER — Inpatient Hospital Stay (HOSPITAL_COMMUNITY): Payer: Medicare Other | Admitting: Anesthesiology

## 2013-10-02 ENCOUNTER — Encounter (HOSPITAL_COMMUNITY)
Admission: EM | Disposition: A | Discharge status: Skilled Nursing Facility | Payer: Self-pay | Source: Home / Self Care | Attending: Internal Medicine

## 2013-10-02 DIAGNOSIS — L089 Local infection of the skin and subcutaneous tissue, unspecified: Secondary | ICD-10-CM | POA: Diagnosis not present

## 2013-10-02 DIAGNOSIS — S71009A Unspecified open wound, unspecified hip, initial encounter: Secondary | ICD-10-CM | POA: Diagnosis not present

## 2013-10-02 HISTORY — PX: SKIN SPLIT GRAFT: SHX444

## 2013-10-02 HISTORY — PX: I&D EXTREMITY: SHX5045

## 2013-10-02 LAB — BASIC METABOLIC PANEL
BUN: 12 mg/dL (ref 6–23)
CO2: 27 mEq/L (ref 19–32)
Calcium: 8.9 mg/dL (ref 8.4–10.5)
Chloride: 97 mEq/L (ref 96–112)
Creatinine, Ser: 0.76 mg/dL (ref 0.50–1.35)
Glucose, Bld: 91 mg/dL (ref 70–99)
Potassium: 3.8 mEq/L (ref 3.7–5.3)
Sodium: 135 mEq/L — ABNORMAL LOW (ref 137–147)

## 2013-10-02 SURGERY — IRRIGATION AND DEBRIDEMENT EXTREMITY
Anesthesia: General | Site: Leg Upper | Laterality: Left

## 2013-10-02 MED ORDER — MINERAL OIL LIGHT 100 % EX OIL
TOPICAL_OIL | CUTANEOUS | Status: AC
  Filled 2013-10-02: qty 25

## 2013-10-02 MED ORDER — SUCCINYLCHOLINE CHLORIDE 20 MG/ML IJ SOLN
INTRAMUSCULAR | Status: AC
  Filled 2013-10-02: qty 1

## 2013-10-02 MED ORDER — PROPOFOL 10 MG/ML IV BOLUS
INTRAVENOUS | Status: DC | PRN
  Administered 2013-10-02: 200 mg via INTRAVENOUS

## 2013-10-02 MED ORDER — HYDROMORPHONE HCL PF 1 MG/ML IJ SOLN
0.2500 mg | INTRAMUSCULAR | Status: DC | PRN
  Administered 2013-10-02: 0.5 mg via INTRAVENOUS

## 2013-10-02 MED ORDER — MINERAL OIL LIGHT 100 % EX OIL
TOPICAL_OIL | CUTANEOUS | Status: DC | PRN
  Administered 2013-10-02: 1 via TOPICAL

## 2013-10-02 MED ORDER — ONDANSETRON HCL 4 MG/2ML IJ SOLN
INTRAMUSCULAR | Status: DC | PRN
  Administered 2013-10-02: 4 mg via INTRAVENOUS

## 2013-10-02 MED ORDER — PROPOFOL 10 MG/ML IV BOLUS
INTRAVENOUS | Status: AC
  Filled 2013-10-02: qty 20

## 2013-10-02 MED ORDER — HYDROMORPHONE HCL PF 1 MG/ML IJ SOLN
INTRAMUSCULAR | Status: AC
  Filled 2013-10-02: qty 1

## 2013-10-02 MED ORDER — PROMETHAZINE HCL 25 MG/ML IJ SOLN: 6.2500 mg | INTRAMUSCULAR | Status: DC | PRN

## 2013-10-02 MED ORDER — 0.9 % SODIUM CHLORIDE (POUR BTL) OPTIME
TOPICAL | Status: DC | PRN
  Administered 2013-10-02: 1000 mL

## 2013-10-02 MED ORDER — MIDAZOLAM HCL 2 MG/2ML IJ SOLN
INTRAMUSCULAR | Status: AC
  Filled 2013-10-02: qty 2

## 2013-10-02 MED ORDER — ONDANSETRON HCL 4 MG/2ML IJ SOLN
INTRAMUSCULAR | Status: AC
  Filled 2013-10-02: qty 2

## 2013-10-02 MED ORDER — DEXAMETHASONE SODIUM PHOSPHATE 4 MG/ML IJ SOLN
INTRAMUSCULAR | Status: AC
  Filled 2013-10-02: qty 2

## 2013-10-02 MED ORDER — FENTANYL CITRATE 0.05 MG/ML IJ SOLN
INTRAMUSCULAR | Status: DC | PRN
  Administered 2013-10-02 (×2): 50 ug via INTRAVENOUS
  Administered 2013-10-02: 100 ug via INTRAVENOUS
  Administered 2013-10-02 (×2): 50 ug via INTRAVENOUS

## 2013-10-02 MED ORDER — FENTANYL CITRATE 0.05 MG/ML IJ SOLN
INTRAMUSCULAR | Status: AC
  Filled 2013-10-02: qty 5

## 2013-10-02 MED ORDER — LIDOCAINE HCL (CARDIAC) 20 MG/ML IV SOLN
INTRAVENOUS | Status: AC
  Filled 2013-10-02: qty 5

## 2013-10-02 MED ORDER — LACTATED RINGERS IV SOLN
INTRAVENOUS | Status: DC | PRN
  Administered 2013-10-02 (×2): via INTRAVENOUS

## 2013-10-02 MED ORDER — ARTIFICIAL TEARS OP OINT
TOPICAL_OINTMENT | OPHTHALMIC | Status: AC
  Filled 2013-10-02: qty 3.5

## 2013-10-02 MED ORDER — DEXAMETHASONE SODIUM PHOSPHATE 4 MG/ML IJ SOLN
INTRAMUSCULAR | Status: DC | PRN
  Administered 2013-10-02: 8 mg via INTRAVENOUS

## 2013-10-02 MED ORDER — LIDOCAINE HCL (CARDIAC) 10 MG/ML IV SOLN
INTRAVENOUS | Status: DC | PRN
  Administered 2013-10-02: 10 mg via INTRAVENOUS

## 2013-10-02 SURGICAL SUPPLY — 80 items
BALL CTTN LRG ABS STRL LF (GAUZE/BANDAGES/DRESSINGS)
BANDAGE ELASTIC 6 VELCRO ST LF (GAUZE/BANDAGES/DRESSINGS) ×4 IMPLANT
BANDAGE GAUZE ELAST BULKY 4 IN (GAUZE/BANDAGES/DRESSINGS) ×4 IMPLANT
BLADE DERMATOME II (BLADE) ×4 IMPLANT
BLADE SURG 10 STRL SS (BLADE) ×4 IMPLANT
BLADE SURG ROTATE 9660 (MISCELLANEOUS) IMPLANT
BNDG COHESIVE 4X5 TAN STRL (GAUZE/BANDAGES/DRESSINGS) ×4 IMPLANT
BNDG GAUZE STRTCH 6 (GAUZE/BANDAGES/DRESSINGS) ×6 IMPLANT
BRUSH SCRUB DISP (MISCELLANEOUS) ×6 IMPLANT
CANISTER SUCTION 2500CC (MISCELLANEOUS) IMPLANT
CANISTER WOUND CARE 500ML ATS (WOUND CARE) ×2 IMPLANT
CONNECTOR Y ATS VAC SYSTEM (MISCELLANEOUS) ×2 IMPLANT
COTTONBALL LRG STERILE PKG (GAUZE/BANDAGES/DRESSINGS) ×4 IMPLANT
COVER SURGICAL LIGHT HANDLE (MISCELLANEOUS) ×6 IMPLANT
DEPRESSOR TONGUE BLADE STERILE (MISCELLANEOUS) ×6 IMPLANT
DERMACARRIERS GRAFT 1 TO 1.5 (DISPOSABLE) ×4
DRAPE C-ARMOR (DRAPES) ×2 IMPLANT
DRAPE EXTREMITY T 121X128X90 (DRAPE) IMPLANT
DRAPE PROXIMA HALF (DRAPES) ×8 IMPLANT
DRAPE U-SHAPE 47X51 STRL (DRAPES) ×4 IMPLANT
DRSG ADAPTIC 3X8 NADH LF (GAUZE/BANDAGES/DRESSINGS) ×2 IMPLANT
DRSG MEPITEL 4X7.2 (GAUZE/BANDAGES/DRESSINGS) ×6 IMPLANT
DRSG PAD ABDOMINAL 8X10 ST (GAUZE/BANDAGES/DRESSINGS) ×4 IMPLANT
DRSG VAC ATS LRG SENSATRAC (GAUZE/BANDAGES/DRESSINGS) ×2 IMPLANT
ELECT CAUTERY BLADE 6.4 (BLADE) ×2 IMPLANT
ELECT REM PT RETURN 9FT ADLT (ELECTROSURGICAL)
ELECTRODE REM PT RTRN 9FT ADLT (ELECTROSURGICAL) IMPLANT
GAUZE SPONGE 4X4 16PLY XRAY LF (GAUZE/BANDAGES/DRESSINGS) ×4 IMPLANT
GAUZE XEROFORM 5X9 LF (GAUZE/BANDAGES/DRESSINGS) IMPLANT
GLOVE BIO SURGEON STRL SZ7.5 (GLOVE) ×4 IMPLANT
GLOVE BIO SURGEON STRL SZ8 (GLOVE) ×4 IMPLANT
GLOVE BIOGEL PI IND STRL 6.5 (GLOVE) IMPLANT
GLOVE BIOGEL PI IND STRL 7.5 (GLOVE) ×2 IMPLANT
GLOVE BIOGEL PI IND STRL 8 (GLOVE) ×2 IMPLANT
GLOVE BIOGEL PI INDICATOR 6.5 (GLOVE) ×4
GLOVE BIOGEL PI INDICATOR 7.5 (GLOVE) ×2
GLOVE BIOGEL PI INDICATOR 8 (GLOVE) ×2
GLOVE SURG SS PI 7.0 STRL IVOR (GLOVE) ×2 IMPLANT
GOWN STRL REUS W/ TWL LRG LVL3 (GOWN DISPOSABLE) ×4 IMPLANT
GOWN STRL REUS W/ TWL XL LVL3 (GOWN DISPOSABLE) ×2 IMPLANT
GOWN STRL REUS W/TWL 2XL LVL3 (GOWN DISPOSABLE) IMPLANT
GOWN STRL REUS W/TWL LRG LVL3 (GOWN DISPOSABLE) ×16
GOWN STRL REUS W/TWL XL LVL3 (GOWN DISPOSABLE) ×8
GRAFT DERMACARRIERS 1 TO 1.5 (DISPOSABLE) IMPLANT
GRAFT TISS THERASKIN 2X3 (Tissue) IMPLANT
HANDPIECE INTERPULSE COAX TIP (DISPOSABLE)
KIT BASIN OR (CUSTOM PROCEDURE TRAY) ×4 IMPLANT
KIT ROOM TURNOVER OR (KITS) ×4 IMPLANT
KIT SPLINT NASAL DENVER MIN BE (GAUZE/BANDAGES/DRESSINGS) ×2 IMPLANT
MANIFOLD NEPTUNE II (INSTRUMENTS) ×4 IMPLANT
NS IRRIG 1000ML POUR BTL (IV SOLUTION) ×4 IMPLANT
PACK GENERAL/GYN (CUSTOM PROCEDURE TRAY) ×2 IMPLANT
PACK ORTHO EXTREMITY (CUSTOM PROCEDURE TRAY) ×4 IMPLANT
PAD ARMBOARD 7.5X6 YLW CONV (MISCELLANEOUS) ×8 IMPLANT
PAD CAST 4YDX4 CTTN HI CHSV (CAST SUPPLIES) ×2 IMPLANT
PAD NEG PRESSURE SENSATRAC (MISCELLANEOUS) ×2 IMPLANT
PADDING CAST COTTON 4X4 STRL (CAST SUPPLIES)
PADDING CAST COTTON 6X4 STRL (CAST SUPPLIES) ×2 IMPLANT
PENCIL BUTTON HOLSTER BLD 10FT (ELECTRODE) ×4 IMPLANT
SET HNDPC FAN SPRY TIP SCT (DISPOSABLE) IMPLANT
SPONGE GAUZE 4X4 12PLY (GAUZE/BANDAGES/DRESSINGS) ×8 IMPLANT
SPONGE LAP 18X18 X RAY DECT (DISPOSABLE) ×12 IMPLANT
SPONGE SCRUB IODOPHOR (GAUZE/BANDAGES/DRESSINGS) ×4 IMPLANT
STAPLER VISISTAT (STAPLE) ×4 IMPLANT
STAPLER VISISTAT 35W (STAPLE) ×6 IMPLANT
STOCKINETTE IMPERVIOUS 9X36 MD (GAUZE/BANDAGES/DRESSINGS) ×4 IMPLANT
STOCKINETTE IMPERVIOUS LG (DRAPES) IMPLANT
SUT ETHILON 3 0 FSL (SUTURE) ×4 IMPLANT
SUT PDS AB 2-0 CT1 27 (SUTURE) IMPLANT
SUT PROLENE 5 0 C 1 24 (SUTURE) ×2 IMPLANT
SUT PROLENE 5 0 C1 (SUTURE) ×2 IMPLANT
TISSUE THERASKIN 2X3 (Tissue) ×20 IMPLANT
TOWEL OR 17X24 6PK STRL BLUE (TOWEL DISPOSABLE) ×4 IMPLANT
TOWEL OR 17X26 10 PK STRL BLUE (TOWEL DISPOSABLE) ×10 IMPLANT
TUBE ANAEROBIC SPECIMEN COL (MISCELLANEOUS) IMPLANT
TUBE CONNECTING 12'X1/4 (SUCTIONS) ×1
TUBE CONNECTING 12X1/4 (SUCTIONS) ×3 IMPLANT
UNDERPAD 30X30 INCONTINENT (UNDERPADS AND DIAPERS) ×4 IMPLANT
WATER STERILE IRR 1000ML POUR (IV SOLUTION) ×2 IMPLANT
YANKAUER SUCT BULB TIP NO VENT (SUCTIONS) ×4 IMPLANT

## 2013-10-02 NOTE — Anesthesia Postprocedure Evaluation (Signed)
Anesthesia Post Note  Patient: [name]  Procedure(s) Performed: Procedure(s) (LRB): IRRIGATION AND DEBRIDEMENT EXTREMITY (Left) SKIN GRAFT SPLIT THICKNESS (Left)  Anesthesia type: general  Patient location: PACU  Post pain: Pain level controlled  Post assessment: Patient's Cardiovascular Status Stable  Last Vitals:  Filed Vitals:   10/02/13 1600  BP: 139/73  Pulse: 95  Temp:   Resp: 16    Post vital signs: Reviewed and stable  Level of consciousness: sedated  Complications: No apparent anesthesia complications

## 2013-10-02 NOTE — Brief Op Note (Addendum)
09/19/2013 - 10/02/2013  3:46 PM  PATIENT:  [name]  63 y.o. male  PRE-OPERATIVE DIAGNOSIS:  Left Leg Wound   POST-OPERATIVE DIAGNOSIS:  Left Leg Wound  PROCEDURE:  Procedure(s): 1. IRRIGATION AND DEBRIDEMENT EXTREMITY (Left), preparation of recipient site 2. TheraSKIN GRAFT SPLIT THICKNESS (Left), 450 cm squared 3. Application of large wound vac  SURGEON:  Surgeon(s) and Role:    * Budd PalmerMichael H Yaniv Lage, MD - Primary  PHYSICIAN ASSISTANT: Montez MoritaKeith Paul, PA-C  ANESTHESIA:   general  I/O:  Total I/O In: 1100 [I.V.:1100] Out: 750 [Urine:700; Blood:50]  SPECIMEN:  No Specimen  TOURNIQUET:  * No tourniquets in log *  DICTATION: .Other Dictation: Dictation Number [id number]

## 2013-10-02 NOTE — Transfer of Care (Signed)
Immediate Anesthesia Transfer of Care Note  Patient: [name] BeachMichael G Giorgio  Procedure(s) Performed: Procedure(s): IRRIGATION AND DEBRIDEMENT EXTREMITY (Left) SKIN GRAFT SPLIT THICKNESS (Left)  Patient Location: PACU  Anesthesia Type:General  Level of Consciousness: awake and patient cooperative  Airway & Oxygen Therapy: Patient Spontanous Breathing and Patient connected to nasal cannula oxygen  Post-op Assessment: Report given to PACU RN, Post -op Vital signs reviewed and stable and Patient moving all extremities X 4  Post vital signs: Reviewed and stable  Complications: No apparent anesthesia complications

## 2013-10-02 NOTE — Progress Notes (Signed)
PT Cancellation Note  Patient Details Name: [name] MRN: [id number] DOB: 08/19/1950   Cancelled Treatment:    Reason Eval/Treat Not Completed: Patient at procedure or test/unavailable. Patient going for I&D and replacement of wound vac today. Will hold PT for today and follow up tomorrow as appropriate   [name], [name] 10/02/2013, 11:53 AM
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
## 2013-10-02 NOTE — Preoperative (Signed)
Beta Blockers   Reason not to administer Beta Blockers:Not Applicable 

## 2013-10-03 LAB — BASIC METABOLIC PANEL
BUN: 10 mg/dL (ref 6–23)
CO2: 27 meq/L (ref 19–32)
CREATININE: 0.62 mg/dL (ref 0.50–1.35)
Calcium: 9 mg/dL (ref 8.4–10.5)
Chloride: 102 mEq/L (ref 96–112)
GFR calc Af Amer: >90 mL/min (ref >90)
GFR calc non Af Amer: >90 mL/min (ref >90)
GLUCOSE: 136 mg/dL — AB (ref 70–99)
Potassium: 4.1 mEq/L (ref 3.7–5.3)
Sodium: 140 mEq/L (ref 137–147)

## 2013-10-03 LAB — CBC
HEMATOCRIT: 36.3 % — AB (ref 39.0–52.0)
Hemoglobin: 11.8 g/dL — ABNORMAL LOW (ref 13.0–17.0)
MCH: 29.2 pg (ref 26.0–34.0)
MCHC: 32.5 g/dL (ref 30.0–36.0)
MCV: 89.9 fL (ref 78.0–100.0)
Platelets: 389 10*3/uL (ref 150–400)
RBC: 4.04 MIL/uL — ABNORMAL LOW (ref 4.22–5.81)
RDW: 14.8 % (ref 11.5–15.5)
WBC: 5 10*3/uL (ref 4.0–10.5)
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
## 2013-10-03 NOTE — Progress Notes (Signed)
OT Cancellation Note  Patient Details Name: [name] BeachMichael G [name] MRN: [id number] DOB: Jul 11, 1951   Cancelled Treatment:    Reason Eval/Treat Not Completed: Other (comment)pt. Declined multiple options for participation in skilled o.t. Stating he had "already done all that stuff" reviewed benefits of completing adl and self care tasks more than one time a day and he states "not today but we'll try tomorrow". Will check back as able.  [name], [name], COTA/L 10/03/2013, 3:00 PM
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
## 2013-10-03 NOTE — Social Work (Signed)
[facility] that patient will likely be ready for SNF transfer on 10/09/13. Plans confirmed with SNF-  Reece LevyJanet Lorelie Biermann, MSW, Theresia MajorsLCSWA [phone]

## 2013-10-03 NOTE — Progress Notes (Signed)
I participated in the care of this patient and agree with the above history, physical and evaluation. I performed a review of the history and a physical exam as detailed   Toney Lizaola Daniel Jahari Wiginton MD  
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
## 2013-10-04 ENCOUNTER — Encounter (HOSPITAL_COMMUNITY): Payer: Self-pay | Admitting: Orthopedic Surgery

## 2013-10-04 LAB — BASIC METABOLIC PANEL
BUN: 13 mg/dL (ref 6–23)
CO2: 28 mEq/L (ref 19–32)
CREATININE: 0.77 mg/dL (ref 0.50–1.35)
Calcium: 8.8 mg/dL (ref 8.4–10.5)
Chloride: 105 mEq/L (ref 96–112)
GFR calc Af Amer: >90 mL/min (ref >90)
Glucose, Bld: 92 mg/dL (ref 70–99)
Potassium: 4 mEq/L (ref 3.7–5.3)
Sodium: 144 mEq/L (ref 137–147)
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
## 2013-10-05 LAB — CREATININE, SERUM
Creatinine, Ser: 0.71 mg/dL (ref 0.50–1.35)
GFR calc Af Amer: >90 mL/min (ref >90)
GFR calc non Af Amer: >90 mL/min (ref >90)
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
## 2013-10-07 NOTE — Progress Notes (Signed)
SPORTS MEDICINE AND JOINT REPLACEMENT  [name] SpurlingStephen Lucey, MD   [name] CabalMaurice Nakeitha Milligan, PA-C [address], [place], [place]  [id number]                             [phone]   PROGRESS NOTE  Subjective:  negative for Chest Pain  negative for Shortness of Breath  negative for Nausea/Vomiting   negative for Calf Pain  negative for Bowel Movement   Tolerating Diet: yes         Patient reports pain as 4 on 0-10 scale.    Objective: Vital signs in last 24 hours:   Patient Vitals for the past 24 hrs:  BP Temp Temp src Pulse Resp SpO2  10/07/13 0620 127/66 mmHg 97.7 F (36.5 C) Oral 98 20 97 %  10/06/13 2207 120/74 mmHg 98 F (36.7 C) Oral 101 20 97 %  10/06/13 1344 128/70 mmHg 98.5 F (36.9 C) - 84 16 100 %    @flow {1959:LAST@   Intake/Output from previous day:   03/06 0701 - 03/07 0700 In: 390 [P.O.:340] Out: 825 [Urine:800; Drains:25]   Intake/Output this shift:       Intake/Output     03/06 0701 - 03/07 0700 03/07 0701 - 03/08 0700   P.O. 340    I.V. (mL/kg)     IV Piggyback 50    Total Intake(mL/kg) 390 (4.1)    Urine (mL/kg/hr) 800 (0.4)    Drains 25 (0)    Total Output 825     Net -435          Stool Occurrence 2 x       LABORATORY DATA:  Recent Labs  10/03/13 0300  WBC 5.0  HGB 11.8*  HCT 36.3*  PLT 389    Recent Labs  10/02/13 0430 10/03/13 0300 10/04/13 0420 10/05/13 0530  NA 135* 140 144  --   K 3.8 4.1 4.0  --   CL 97 102 105  --   CO2 27 27 28   --   BUN 12 10 13   --   CREATININE 0.76 0.62 0.77 0.71  GLUCOSE 91 136* 92  --   CALCIUM 8.9 9.0 8.8  --    Lab Results  Component Value Date   INR 1.02 09/20/2013   INR 1.09 08/30/2013   INR 0.92 08/29/2013    Examination:  General appearance: alert, cooperative and no distress Extremities: Homans sign is negative, no sign of DVT  Wound Exam: clean, dry, intact   Drainage:  None: wound tissue dry  Motor Exam: EHL and FHL Intact  Sensory Exam: Deep Peroneal normal   Assessment:     5 Days Post-Op  Procedure(s) (LRB): IRRIGATION AND DEBRIDEMENT EXTREMITY (Left) SKIN GRAFT SPLIT THICKNESS (Left)  ADDITIONAL DIAGNOSIS:  Principal Problem:   Cellulitis and abscess of leg Active Problems:   Pelvic fracture   Open right fibular fracture   Complicated open wound of left thigh   Schizophrenia   Tobacco abuse, in remission  Acute Blood Loss Anemia   Plan: Physical Therapy as ordered Touch Down Weight Bearing (TDWB)  Remove wound vac Monday per patient report  DISCHARGE PLAN: per medicine           [name] 10/07/2013, 8:21 AM
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
## 2013-10-09 MED ORDER — SULFAMETHOXAZOLE-TMP DS 800-160 MG PO TABS: 1.0000 | ORAL_TABLET | Freq: Two times a day (BID) | ORAL | Status: DC

## 2013-10-09 MED ORDER — OXYCODONE HCL 5 MG PO TABS: 5.0000 mg | ORAL_TABLET | ORAL | Status: DC | PRN

## 2013-10-09 NOTE — Discharge Instructions (Signed)
[facility] Discharge Instructions   General Discharge Instructions  WEIGHT BEARING STATUS: Touchdown weightbearing Left leg, WBAT Right lower Extremity   RANGE OF MOTION/ACTIVITY: range of motion as tolerated B Lower extremities   Wound care: Wound care to start on 10/12/2012  Dressing change for stsg recipient site as follows (left leg)  adaptic, 4x4, kerlix and ace wrap  Change q 3 days  Do not clean wound with any ointments, solutions, solvents  If you do not know what the dressing materials are or have any questions with wound care please call office to address before changing dressing   Change dressing on R leg every other day, dry dressing should only be needed.    Diet: as you were eating previously.  Can use over the counter stool softeners and bowel preparations, such as Miralax, to help with bowel movements.  Narcotics can be constipating.  Be sure to drink plenty of fluids  STOP SMOKING OR USING NICOTINE PRODUCTS!!!!  As discussed nicotine severely impairs your body's ability to heal surgical and traumatic wounds but also impairs bone healing.  Wounds and bone heal by forming microscopic blood vessels (angiogenesis) and nicotine is a vasoconstrictor (essentially, shrinks blood vessels).  Therefore, if vasoconstriction occurs to these microscopic blood vessels they essentially disappear and are unable to deliver necessary nutrients to the healing tissue.  This is one modifiable factor that you can do to dramatically increase your chances of healing your injury.    (This means no smoking, no nicotine gum, patches, etc)  DO NOT USE NONSTEROIDAL ANTI-INFLAMMATORY DRUGS (NSAID'S)  Using products such as Advil (ibuprofen), Aleve (naproxen), Motrin (ibuprofen) for additional pain control during fracture healing can delay and/or prevent the healing response.  If you would like to take over the counter (OTC) medication, Tylenol (acetaminophen) is ok.  However, some  narcotic medications that are given for pain control contain acetaminophen as well. Therefore, you should not exceed more than 4000 mg of tylenol in a day if you do not have liver disease.  Also note that there are may OTC medicines, such as cold medicines and allergy medicines that my contain tylenol as well.  If you have any questions about medications and/or interactions please ask your doctor/PA or your pharmacist.   PAIN MEDICATION USE AND EXPECTATIONS  You have likely been given narcotic medications to help control your pain.  After a traumatic event that results in an fracture (broken bone) with or without surgery, it is ok to use narcotic pain medications to help control one's pain.  We understand that everyone responds to pain differently and each individual patient will be evaluated on a regular basis for the continued need for narcotic medications. Ideally, narcotic medication use should last no more than 6-8 weeks (coinciding with fracture healing).   As a patient it is your responsibility as well to monitor narcotic medication use and report the amount and frequency you use these medications when you come to your office visit.   We would also advise that if you are using narcotic medications, you should take a dose prior to therapy to maximize you participation.  IF YOU ARE ON NARCOTIC MEDICATIONS IT IS NOT PERMISSIBLE TO OPERATE A MOTOR VEHICLE (MOTORCYCLE/CAR/TRUCK/MOPED) OR HEAVY MACHINERY DO NOT MIX NARCOTICS WITH OTHER CNS (CENTRAL NERVOUS SYSTEM) DEPRESSANTS SUCH AS ALCOHOL       ICE AND ELEVATE INJURED/OPERATIVE EXTREMITY  Using ice and elevating the injured extremity above your heart can help with swelling and pain control.  Icing in a pulsatile fashion, such as 20 minutes on and 20 minutes off, can be followed.    Do not place ice directly on skin. Make sure there is a barrier between to skin and the ice pack.    Using frozen items such as frozen peas works well as the conform  nicely to the are that needs to be iced.  USE AN ACE WRAP OR TED HOSE FOR SWELLING CONTROL  In addition to icing and elevation, Ace wraps or TED hose are used to help limit and resolve swelling.  It is recommended to use Ace wraps or TED hose until you are informed to stop.    When using Ace Wraps start the wrapping distally (farthest away from the body) and wrap proximally (closer to the body)   Example: If you had surgery on your leg or thing and you do not have a splint on, start the ace wrap at the toes and work your way up to the thigh        If you had surgery on your upper extremity and do not have a splint on, start the ace wrap at your fingers and work your way up to the upper arm  IF YOU ARE IN A SPLINT OR CAST DO NOT REMOVE IT FOR ANY REASON   If your splint gets wet for any reason please contact the office immediately. You may shower in your splint or cast as long as you keep it dry.  This can be done by wrapping in a cast cover or garbage back (or similar)  Do Not stick any thing down your splint or cast such as pencils, money, or hangers to try and scratch yourself with.  If you feel itchy take benadryl as prescribed on the bottle for itching  IF YOU ARE IN A CAM BOOT (BLACK BOOT)  You may remove boot periodically. Perform daily dressing changes as noted below.  Wash the liner of the boot regularly and wear a sock when wearing the boot. It is recommended that you sleep in the boot until told otherwise  CALL THE OFFICE WITH ANY QUESTIONS OR CONCERTS: [phone]
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
## 2013-10-09 NOTE — Social Work (Signed)
Patient's PASARR expired 10/05/13 and I have submitted request for 30 day new #. This has not been rec'd as of yet so we will have to hold until tomorrow- will advise in the morning-  Reece LevyJanet Elynore Dolinski, MSW, Theresia MajorsLCSWA [phone]
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
## 2013-10-10 ENCOUNTER — Other Ambulatory Visit: Payer: Self-pay | Admitting: *Deleted

## 2013-10-10 MED ORDER — OXYCODONE HCL 10 MG PO TABS: ORAL_TABLET | ORAL | Status: DC
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
## 2013-10-10 NOTE — Progress Notes (Signed)
[facility] PROGRESS NOTE  [name] BeachMichael G [name] [name] DOB: [birth year] DOA: 09/19/2013 PCP: No PCP Per Patient Events of last p.m. noted, patient discharged but did not leave due to 30 day note per SW which was requested and pending Filed Vitals:   10/10/13 0635  BP: 130/63  Pulse: 105  Temp: 97.5 F (36.4 [name])  Resp: 18  Exam:  General: alert and oriented x3  Cardiovascular: s1,s2 rrr  Respiratory: CTA BL  Abdomen: soft, nt,nd  Extremities: L LE wound bandaged.   He remains medically ready for discharge today, please discharge summary of 3/9 unchanged today     [facility]YU[facility],[name]  [facility] Pager [phone]. If 7PM-7AM, please contact night-coverage at [email or web address], password [email or web address] 10/10/2013, 12:02 PM  LOS: 21 days
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
## 2013-10-11 NOTE — Progress Notes (Signed)
[facility], report given to nurse Mayford KnifeWilliams

## 2013-10-17 ENCOUNTER — Encounter: Payer: Self-pay | Admitting: Internal Medicine

## 2013-10-17 ENCOUNTER — Non-Acute Institutional Stay (SKILLED_NURSING_FACILITY): Payer: Medicare Other | Admitting: Internal Medicine

## 2013-10-17 DIAGNOSIS — S71009A Unspecified open wound, unspecified hip, initial encounter: Secondary | ICD-10-CM

## 2013-10-17 DIAGNOSIS — S329XXA Fracture of unspecified parts of lumbosacral spine and pelvis, initial encounter for closed fracture: Secondary | ICD-10-CM

## 2013-10-17 DIAGNOSIS — R338 Other retention of urine: Secondary | ICD-10-CM

## 2013-10-17 DIAGNOSIS — L02419 Cutaneous abscess of limb, unspecified: Secondary | ICD-10-CM

## 2013-10-17 DIAGNOSIS — F419 Anxiety disorder, unspecified: Secondary | ICD-10-CM | POA: Insufficient documentation

## 2013-10-17 DIAGNOSIS — F411 Generalized anxiety disorder: Secondary | ICD-10-CM

## 2013-10-17 DIAGNOSIS — S82401B Unspecified fracture of shaft of right fibula, initial encounter for open fracture type I or II: Secondary | ICD-10-CM

## 2013-10-17 DIAGNOSIS — S71109A Unspecified open wound, unspecified thigh, initial encounter: Secondary | ICD-10-CM

## 2013-10-17 DIAGNOSIS — S71102A Unspecified open wound, left thigh, initial encounter: Secondary | ICD-10-CM

## 2013-10-17 DIAGNOSIS — L03119 Cellulitis of unspecified part of limb: Secondary | ICD-10-CM

## 2013-10-17 DIAGNOSIS — S82409B Unspecified fracture of shaft of unspecified fibula, initial encounter for open fracture type I or II: Secondary | ICD-10-CM

## 2013-10-17 DIAGNOSIS — F209 Schizophrenia, unspecified: Secondary | ICD-10-CM
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
## 2013-10-17 NOTE — Assessment & Plan Note (Addendum)
S/p debridement twice with wound vac placement; graft site looks good;Wound care and dressing change q 3 days;see below

## 2013-10-17 NOTE — Assessment & Plan Note (Signed)
Dry dressing changes QOD and WBAT

## 2013-10-17 NOTE — Assessment & Plan Note (Signed)
Continue buspar

## 2013-10-17 NOTE — Assessment & Plan Note (Addendum)
Complex wound infected;treated initially with vanc/zosyn then rocephin then po bactrim for 3 weeks upon d/c; BC have been neg; wound with minimal heat today

## 2013-10-17 NOTE — Assessment & Plan Note (Signed)
Continue prolixin and seroquell

## 2013-10-17 NOTE — Assessment & Plan Note (Signed)
Continue flomax  

## 2013-10-17 NOTE — Assessment & Plan Note (Signed)
L LC 2 pelvic fx;per orthoTDWB L leg for 4 more weeks per ortho

## 2013-10-30 ENCOUNTER — Encounter (HOSPITAL_COMMUNITY): Payer: Self-pay | Admitting: Pharmacy Technician

## 2013-10-30 ENCOUNTER — Encounter (HOSPITAL_COMMUNITY): Payer: Self-pay | Admitting: *Deleted
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
## 2013-10-30 NOTE — Progress Notes (Signed)
10/30/13 1625  OBSTRUCTIVE SLEEP APNEA  Have you ever been diagnosed with sleep apnea through a sleep study? No  Do you snore loudly (loud enough to be heard through closed doors)?  1  Do you often feel tired, fatigued, or sleepy during the daytime? 0  Has anyone observed you stop breathing during your sleep? 1  Do you have, or are you being treated for high blood pressure? 0  BMI more than 35 kg/m2? 0  Age over 62 years old? 1  Gender: 1  Obstructive Sleep Apnea Score 4  Score 4 or greater  Results sent to PCP

## 2013-10-31 ENCOUNTER — Inpatient Hospital Stay (HOSPITAL_COMMUNITY): Payer: Medicare Other | Admitting: Certified Registered"

## 2013-10-31 ENCOUNTER — Encounter (HOSPITAL_COMMUNITY): Payer: Self-pay | Admitting: *Deleted

## 2013-10-31 ENCOUNTER — Encounter (HOSPITAL_COMMUNITY): Payer: Medicare Other | Admitting: Certified Registered"

## 2013-10-31 ENCOUNTER — Inpatient Hospital Stay (HOSPITAL_COMMUNITY)
Admission: RE | Admit: 2013-10-31 | Discharge: 2013-11-03 | DRG: 905 | Disposition: A | Discharge status: Skilled Nursing Facility | Payer: Medicare Other | Source: Ambulatory Visit | Attending: Orthopedic Surgery | Admitting: Orthopedic Surgery

## 2013-10-31 ENCOUNTER — Encounter (HOSPITAL_COMMUNITY)
Admission: RE | Disposition: A | Discharge status: Skilled Nursing Facility | Payer: Self-pay | Source: Ambulatory Visit | Attending: Orthopedic Surgery

## 2013-10-31 DIAGNOSIS — F17200 Nicotine dependence, unspecified, uncomplicated: Secondary | ICD-10-CM | POA: Diagnosis present

## 2013-10-31 DIAGNOSIS — L02419 Cutaneous abscess of limb, unspecified: Secondary | ICD-10-CM | POA: Diagnosis present

## 2013-10-31 DIAGNOSIS — S71009A Unspecified open wound, unspecified hip, initial encounter: Principal | ICD-10-CM | POA: Diagnosis present

## 2013-10-31 DIAGNOSIS — F209 Schizophrenia, unspecified: Secondary | ICD-10-CM | POA: Diagnosis not present

## 2013-10-31 DIAGNOSIS — N2889 Other specified disorders of kidney and ureter: Secondary | ICD-10-CM | POA: Diagnosis present

## 2013-10-31 DIAGNOSIS — Z87820 Personal history of traumatic brain injury: Secondary | ICD-10-CM

## 2013-10-31 DIAGNOSIS — L03119 Cellulitis of unspecified part of limb: Secondary | ICD-10-CM | POA: Diagnosis present

## 2013-10-31 DIAGNOSIS — Z9889 Other specified postprocedural states: Secondary | ICD-10-CM

## 2013-10-31 DIAGNOSIS — S71102A Unspecified open wound, left thigh, initial encounter: Secondary | ICD-10-CM | POA: Diagnosis present

## 2013-10-31 DIAGNOSIS — S71109A Unspecified open wound, unspecified thigh, initial encounter: Principal | ICD-10-CM | POA: Diagnosis present

## 2013-10-31 DIAGNOSIS — F411 Generalized anxiety disorder: Secondary | ICD-10-CM | POA: Diagnosis not present

## 2013-10-31 DIAGNOSIS — F419 Anxiety disorder, unspecified: Secondary | ICD-10-CM | POA: Diagnosis present

## 2013-10-31 DIAGNOSIS — F17201 Nicotine dependence, unspecified, in remission: Secondary | ICD-10-CM

## 2013-10-31 HISTORY — PX: SKIN GRAFT: SHX250

## 2013-10-31 HISTORY — DX: Unspecified injury of head, initial encounter: S09.90XA

## 2013-10-31 HISTORY — DX: Failed or difficult intubation, initial encounter: T88.4XXA

## 2013-10-31 HISTORY — DX: Fracture of unspecified parts of lumbosacral spine and pelvis, initial encounter for closed fracture: S32.9XXA

## 2013-10-31 HISTORY — DX: Other retention of urine: R33.8

## 2013-10-31 HISTORY — PX: SKIN SPLIT GRAFT: SHX444

## 2013-10-31 LAB — CBC
HCT: 41.4 % (ref 39.0–52.0)
HCT: 36.3 % — ABNORMAL LOW (ref 39.0–52.0)
HEMOGLOBIN: 13.4 g/dL (ref 13.0–17.0)
Hemoglobin: 11.7 g/dL — ABNORMAL LOW (ref 13.0–17.0)
MCH: 27.7 pg (ref 26.0–34.0)
MCH: 27.7 pg (ref 26.0–34.0)
MCHC: 32.4 g/dL (ref 30.0–36.0)
MCHC: 32.2 g/dL (ref 30.0–36.0)
MCV: 85.5 fL (ref 78.0–100.0)
MCV: 86 fL (ref 78.0–100.0)
PLATELETS: 299 10*3/uL (ref 150–400)
Platelets: 282 K/uL (ref 150–400)
RBC: 4.84 MIL/uL (ref 4.22–5.81)
RBC: 4.22 MIL/uL (ref 4.22–5.81)
RDW: 15.2 % (ref 11.5–15.5)
RDW: 15.3 % (ref 11.5–15.5)
WBC: 8.3 10*3/uL (ref 4.0–10.5)
WBC: 7.2 K/uL (ref 4.0–10.5)

## 2013-10-31 LAB — BASIC METABOLIC PANEL
BUN: 11 mg/dL (ref 6–23)
CALCIUM: 9.6 mg/dL (ref 8.4–10.5)
CO2: 24 mEq/L (ref 19–32)
Chloride: 99 mEq/L (ref 96–112)
Creatinine, Ser: 0.92 mg/dL (ref 0.50–1.35)
GFR calc Af Amer: >90 mL/min (ref >90)
GFR calc non Af Amer: 89 mL/min — ABNORMAL LOW (ref >90)
GLUCOSE: 91 mg/dL (ref 70–99)
POTASSIUM: 4 meq/L (ref 3.7–5.3)
SODIUM: 139 meq/L (ref 137–147)

## 2013-10-31 LAB — CREATININE, SERUM: CREATININE: 0.77 mg/dL (ref 0.50–1.35)

## 2013-10-31 SURGERY — APPLICATION, GRAFT, SKIN, SPLIT-THICKNESS
Anesthesia: General | Site: Thigh | Laterality: Left

## 2013-10-31 MED ORDER — METOCLOPRAMIDE HCL 5 MG PO TABS
5.0000 mg | ORAL_TABLET | Freq: Three times a day (TID) | ORAL | Status: DC | PRN
  Filled 2013-10-31: qty 2

## 2013-10-31 MED ORDER — FLUPHENAZINE HCL 5 MG PO TABS
15.0000 mg | ORAL_TABLET | Freq: Every day | ORAL | Status: DC
  Administered 2013-10-31 – 2013-11-02 (×3): 15 mg via ORAL
  Filled 2013-10-31 (×4): qty 1

## 2013-10-31 MED ORDER — LACTATED RINGERS IV SOLN
INTRAVENOUS | Status: DC
  Administered 2013-10-31: 07:00:00 via INTRAVENOUS

## 2013-10-31 MED ORDER — BUSPIRONE HCL 15 MG PO TABS
15.0000 mg | ORAL_TABLET | Freq: Two times a day (BID) | ORAL | Status: DC
  Administered 2013-10-31 – 2013-11-03 (×6): 15 mg via ORAL
  Filled 2013-10-31 (×7): qty 1

## 2013-10-31 MED ORDER — BUPIVACAINE-EPINEPHRINE PF 0.25-1:200000 % IJ SOLN
INTRAMUSCULAR | Status: AC
  Filled 2013-10-31: qty 30

## 2013-10-31 MED ORDER — BUPIVACAINE HCL 0.25 % IJ SOLN
INTRAMUSCULAR | Status: DC | PRN
  Administered 2013-10-31: 30 mL

## 2013-10-31 MED ORDER — POTASSIUM CHLORIDE IN NACL 20-0.9 MEQ/L-% IV SOLN
INTRAVENOUS | Status: DC
  Administered 2013-10-31 – 2013-11-01 (×2): via INTRAVENOUS
  Filled 2013-10-31 (×5): qty 1000

## 2013-10-31 MED ORDER — ONDANSETRON HCL 4 MG PO TABS: 4.0000 mg | ORAL_TABLET | Freq: Four times a day (QID) | ORAL | Status: DC | PRN

## 2013-10-31 MED ORDER — DOCUSATE SODIUM 100 MG PO CAPS
100.0000 mg | ORAL_CAPSULE | Freq: Two times a day (BID) | ORAL | Status: DC
  Administered 2013-10-31 – 2013-11-03 (×5): 100 mg via ORAL
  Filled 2013-10-31 (×7): qty 1

## 2013-10-31 MED ORDER — CEFAZOLIN SODIUM-DEXTROSE 2-3 GM-% IV SOLR
2.0000 g | INTRAVENOUS | Status: AC
  Administered 2013-10-31: 2 g via INTRAVENOUS
  Filled 2013-10-31: qty 50

## 2013-10-31 MED ORDER — PHENYLEPHRINE HCL 10 MG/ML IJ SOLN
20.0000 mg | INTRAVENOUS | Status: DC | PRN
  Administered 2013-10-31 (×3): 10 ug/min via INTRAVENOUS

## 2013-10-31 MED ORDER — METHOCARBAMOL 500 MG PO TABS
500.0000 mg | ORAL_TABLET | Freq: Four times a day (QID) | ORAL | Status: DC | PRN
  Administered 2013-10-31 – 2013-11-02 (×2): 500 mg via ORAL
  Filled 2013-10-31: qty 1

## 2013-10-31 MED ORDER — MIDAZOLAM HCL 5 MG/5ML IJ SOLN
INTRAMUSCULAR | Status: DC | PRN
  Administered 2013-10-31: 2 mg via INTRAVENOUS

## 2013-10-31 MED ORDER — PHENYLEPHRINE 40 MCG/ML (10ML) SYRINGE FOR IV PUSH (FOR BLOOD PRESSURE SUPPORT)
PREFILLED_SYRINGE | INTRAVENOUS | Status: AC
  Filled 2013-10-31: qty 10

## 2013-10-31 MED ORDER — SULFAMETHOXAZOLE-TMP DS 800-160 MG PO TABS
1.0000 | ORAL_TABLET | Freq: Two times a day (BID) | ORAL | Status: DC
  Administered 2013-10-31 – 2013-11-03 (×7): 1 via ORAL
  Filled 2013-10-31 (×8): qty 1

## 2013-10-31 MED ORDER — METHOCARBAMOL 500 MG PO TABS
ORAL_TABLET | ORAL | Status: AC
  Filled 2013-10-31: qty 1

## 2013-10-31 MED ORDER — ACETAMINOPHEN 500 MG PO TABS
1000.0000 mg | ORAL_TABLET | Freq: Once | ORAL | Status: AC
  Administered 2013-10-31: 1000 mg via ORAL
  Filled 2013-10-31: qty 2

## 2013-10-31 MED ORDER — PNEUMOCOCCAL VAC POLYVALENT 25 MCG/0.5ML IJ INJ
0.5000 mL | INJECTION | INTRAMUSCULAR | Status: AC
  Administered 2013-11-01: 0.5 mL via INTRAMUSCULAR
  Filled 2013-10-31: qty 0.5

## 2013-10-31 MED ORDER — PROPOFOL 10 MG/ML IV BOLUS
INTRAVENOUS | Status: AC
  Filled 2013-10-31: qty 20

## 2013-10-31 MED ORDER — CEFAZOLIN SODIUM 1-5 GM-% IV SOLN
1.0000 g | Freq: Four times a day (QID) | INTRAVENOUS | Status: AC
  Administered 2013-10-31 – 2013-11-01 (×3): 1 g via INTRAVENOUS
  Filled 2013-10-31 (×4): qty 50

## 2013-10-31 MED ORDER — QUETIAPINE FUMARATE 200 MG PO TABS
200.0000 mg | ORAL_TABLET | Freq: Every day | ORAL | Status: DC
  Administered 2013-10-31 – 2013-11-02 (×3): 200 mg via ORAL
  Filled 2013-10-31 (×4): qty 1

## 2013-10-31 MED ORDER — MIDAZOLAM HCL 2 MG/2ML IJ SOLN
INTRAMUSCULAR | Status: AC
  Filled 2013-10-31: qty 2

## 2013-10-31 MED ORDER — HYDROMORPHONE HCL PF 1 MG/ML IJ SOLN
INTRAMUSCULAR | Status: AC
  Filled 2013-10-31: qty 1

## 2013-10-31 MED ORDER — SUCCINYLCHOLINE CHLORIDE 20 MG/ML IJ SOLN
INTRAMUSCULAR | Status: AC
  Filled 2013-10-31: qty 1

## 2013-10-31 MED ORDER — HYDROMORPHONE HCL PF 1 MG/ML IJ SOLN
0.5000 mg | INTRAMUSCULAR | Status: DC | PRN
  Administered 2013-10-31 – 2013-11-02 (×8): 1 mg via INTRAVENOUS
  Filled 2013-10-31 (×8): qty 1

## 2013-10-31 MED ORDER — HYDROMORPHONE HCL PF 1 MG/ML IJ SOLN
0.2500 mg | INTRAMUSCULAR | Status: DC | PRN
  Administered 2013-10-31 (×2): 0.5 mg via INTRAVENOUS

## 2013-10-31 MED ORDER — ROCURONIUM BROMIDE 50 MG/5ML IV SOLN
INTRAVENOUS | Status: AC
  Filled 2013-10-31: qty 1

## 2013-10-31 MED ORDER — FENTANYL CITRATE 0.05 MG/ML IJ SOLN
INTRAMUSCULAR | Status: AC
  Filled 2013-10-31: qty 5

## 2013-10-31 MED ORDER — OXYCODONE HCL 5 MG PO TABS
5.0000 mg | ORAL_TABLET | ORAL | Status: DC | PRN
  Administered 2013-10-31 – 2013-11-01 (×2): 10 mg via ORAL
  Filled 2013-10-31 (×3): qty 2

## 2013-10-31 MED ORDER — LIDOCAINE HCL (CARDIAC) 20 MG/ML IV SOLN
INTRAVENOUS | Status: DC | PRN
  Administered 2013-10-31: 60 mg via INTRAVENOUS

## 2013-10-31 MED ORDER — LACTATED RINGERS IV SOLN: INTRAVENOUS | Status: DC

## 2013-10-31 MED ORDER — LACTATED RINGERS IV SOLN
INTRAVENOUS | Status: DC | PRN
  Administered 2013-10-31: 07:00:00 via INTRAVENOUS

## 2013-10-31 MED ORDER — ENOXAPARIN SODIUM 40 MG/0.4ML ~~LOC~~ SOLN
40.0000 mg | SUBCUTANEOUS | Status: DC
  Administered 2013-10-31 – 2013-11-02 (×3): 40 mg via SUBCUTANEOUS
  Filled 2013-10-31 (×4): qty 0.4

## 2013-10-31 MED ORDER — ALBUMIN HUMAN 5 % IV SOLN
INTRAVENOUS | Status: DC | PRN
  Administered 2013-10-31 (×2): via INTRAVENOUS

## 2013-10-31 MED ORDER — LIDOCAINE HCL (CARDIAC) 20 MG/ML IV SOLN
INTRAVENOUS | Status: AC
  Filled 2013-10-31: qty 5

## 2013-10-31 MED ORDER — SODIUM CHLORIDE 0.9 % IR SOLN
Status: DC | PRN
  Administered 2013-10-31: 1000 mL

## 2013-10-31 MED ORDER — MINERAL OIL LIGHT 100 % EX OIL
TOPICAL_OIL | CUTANEOUS | Status: AC
  Filled 2013-10-31: qty 25

## 2013-10-31 MED ORDER — FENTANYL CITRATE 0.05 MG/ML IJ SOLN
INTRAMUSCULAR | Status: DC | PRN
  Administered 2013-10-31: 100 ug via INTRAVENOUS

## 2013-10-31 MED ORDER — ONDANSETRON HCL 4 MG/2ML IJ SOLN: 4.0000 mg | Freq: Four times a day (QID) | INTRAMUSCULAR | Status: DC | PRN

## 2013-10-31 MED ORDER — OXYCODONE-ACETAMINOPHEN 5-325 MG PO TABS
1.0000 | ORAL_TABLET | ORAL | Status: DC | PRN
  Administered 2013-11-01 – 2013-11-03 (×6): 2 via ORAL
  Filled 2013-10-31 (×6): qty 2

## 2013-10-31 MED ORDER — DEXTROSE 5 % IV SOLN
500.0000 mg | Freq: Four times a day (QID) | INTRAVENOUS | Status: DC | PRN
  Filled 2013-10-31: qty 5

## 2013-10-31 MED ORDER — PROPOFOL 10 MG/ML IV BOLUS
INTRAVENOUS | Status: DC | PRN
  Administered 2013-10-31: 150 mg via INTRAVENOUS
  Administered 2013-10-31: 20 mg via INTRAVENOUS

## 2013-10-31 MED ORDER — METOCLOPRAMIDE HCL 5 MG/ML IJ SOLN: 5.0000 mg | Freq: Three times a day (TID) | INTRAMUSCULAR | Status: DC | PRN

## 2013-10-31 MED ORDER — EPHEDRINE SULFATE 50 MG/ML IJ SOLN
INTRAMUSCULAR | Status: DC | PRN
  Administered 2013-10-31 (×2): 5 mg via INTRAVENOUS
  Administered 2013-10-31: 10 mg via INTRAVENOUS

## 2013-10-31 MED ORDER — PHENYLEPHRINE HCL 10 MG/ML IJ SOLN
INTRAMUSCULAR | Status: DC | PRN
  Administered 2013-10-31: 40 ug via INTRAVENOUS
  Administered 2013-10-31 (×2): 80 ug via INTRAVENOUS
  Administered 2013-10-31 (×3): 40 ug via INTRAVENOUS

## 2013-10-31 SURGICAL SUPPLY — 61 items
BALL CTTN LRG ABS STRL LF (GAUZE/BANDAGES/DRESSINGS) ×2
BANDAGE ELASTIC 4 VELCRO ST LF (GAUZE/BANDAGES/DRESSINGS) ×2 IMPLANT
BANDAGE ELASTIC 6 VELCRO ST LF (GAUZE/BANDAGES/DRESSINGS) ×6 IMPLANT
BANDAGE GAUZE ELAST BULKY 4 IN (GAUZE/BANDAGES/DRESSINGS) IMPLANT
BLADE SURG ROTATE 9660 (MISCELLANEOUS) IMPLANT
BNDG COHESIVE 4X5 TAN STRL (GAUZE/BANDAGES/DRESSINGS) IMPLANT
BRUSH SCRUB DISP (MISCELLANEOUS) ×6 IMPLANT
CANISTER SUCTION 2500CC (MISCELLANEOUS) IMPLANT
COTTONBALL LRG STERILE PKG (GAUZE/BANDAGES/DRESSINGS) ×6 IMPLANT
COVER SURGICAL LIGHT HANDLE (MISCELLANEOUS) ×6 IMPLANT
DERMACARRIERS GRAFT 1 TO 1.5 (DISPOSABLE)
DRAPE C-ARMOR (DRAPES) ×3 IMPLANT
DRAPE EXTREMITY T 121X128X90 (DRAPE) IMPLANT
DRAPE PROXIMA HALF (DRAPES) ×6 IMPLANT
DRSG ADAPTIC 3X8 NADH LF (GAUZE/BANDAGES/DRESSINGS) ×2 IMPLANT
DRSG MEPITEL 4X7.2 (GAUZE/BANDAGES/DRESSINGS) ×4 IMPLANT
DRSG PAD ABDOMINAL 8X10 ST (GAUZE/BANDAGES/DRESSINGS) ×4 IMPLANT
DRSG VAC ATS LRG SENSATRAC (GAUZE/BANDAGES/DRESSINGS) ×2 IMPLANT
ELECT REM PT RETURN 9FT ADLT (ELECTROSURGICAL)
ELECTRODE REM PT RTRN 9FT ADLT (ELECTROSURGICAL) IMPLANT
GAUZE SPONGE 4X4 16PLY XRAY LF (GAUZE/BANDAGES/DRESSINGS) ×3 IMPLANT
GAUZE XEROFORM 1X8 LF (GAUZE/BANDAGES/DRESSINGS) ×2 IMPLANT
GAUZE XEROFORM 5X9 LF (GAUZE/BANDAGES/DRESSINGS) ×2 IMPLANT
GLOVE BIO SURGEON STRL SZ7.5 (GLOVE) ×3 IMPLANT
GLOVE BIO SURGEON STRL SZ8 (GLOVE) ×3 IMPLANT
GLOVE BIOGEL PI IND STRL 7.5 (GLOVE) ×1 IMPLANT
GLOVE BIOGEL PI IND STRL 8 (GLOVE) ×1 IMPLANT
GLOVE BIOGEL PI INDICATOR 7.5 (GLOVE) ×2
GLOVE BIOGEL PI INDICATOR 8 (GLOVE) ×2
GOWN STRL REUS W/ TWL LRG LVL3 (GOWN DISPOSABLE) ×2 IMPLANT
GOWN STRL REUS W/ TWL XL LVL3 (GOWN DISPOSABLE) ×1 IMPLANT
GOWN STRL REUS W/TWL 2XL LVL3 (GOWN DISPOSABLE) IMPLANT
GOWN STRL REUS W/TWL LRG LVL3 (GOWN DISPOSABLE) ×6
GOWN STRL REUS W/TWL XL LVL3 (GOWN DISPOSABLE) ×3
GRAFT DERMACARRIERS 1 TO 1.5 (DISPOSABLE) IMPLANT
HANDPIECE INTERPULSE COAX TIP (DISPOSABLE)
KIT BASIN OR (CUSTOM PROCEDURE TRAY) ×3 IMPLANT
KIT ROOM TURNOVER OR (KITS) ×3 IMPLANT
MANIFOLD NEPTUNE II (INSTRUMENTS) IMPLANT
NS IRRIG 1000ML POUR BTL (IV SOLUTION) ×3 IMPLANT
PACK GENERAL/GYN (CUSTOM PROCEDURE TRAY) ×3 IMPLANT
PAD ARMBOARD 7.5X6 YLW CONV (MISCELLANEOUS) ×6 IMPLANT
PAD CAST 4YDX4 CTTN HI CHSV (CAST SUPPLIES) ×1 IMPLANT
PADDING CAST COTTON 4X4 STRL (CAST SUPPLIES) ×3
PADDING CAST COTTON 6X4 STRL (CAST SUPPLIES) ×3 IMPLANT
PENCIL BUTTON HOLSTER BLD 10FT (ELECTRODE) ×3 IMPLANT
SET HNDPC FAN SPRY TIP SCT (DISPOSABLE) IMPLANT
SPONGE GAUZE 4X4 12PLY (GAUZE/BANDAGES/DRESSINGS) ×4 IMPLANT
SPONGE LAP 18X18 X RAY DECT (DISPOSABLE) ×9 IMPLANT
SPONGE SCRUB IODOPHOR (GAUZE/BANDAGES/DRESSINGS) ×3 IMPLANT
STAPLER VISISTAT (STAPLE) ×3 IMPLANT
STAPLER VISISTAT 35W (STAPLE) ×5 IMPLANT
STOCKINETTE IMPERVIOUS LG (DRAPES) IMPLANT
SUT ETHILON 3 0 FSL (SUTURE) ×3 IMPLANT
TOWEL OR 17X24 6PK STRL BLUE (TOWEL DISPOSABLE) ×3 IMPLANT
TOWEL OR 17X26 10 PK STRL BLUE (TOWEL DISPOSABLE) ×6 IMPLANT
TUBE CONNECTING 12'X1/4 (SUCTIONS)
TUBE CONNECTING 12X1/4 (SUCTIONS) IMPLANT
UNDERPAD 30X30 INCONTINENT (UNDERPADS AND DIAPERS) ×3 IMPLANT
WATER STERILE IRR 1000ML POUR (IV SOLUTION) ×3 IMPLANT
YANKAUER SUCT BULB TIP NO VENT (SUCTIONS) IMPLANT
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
## 2013-10-31 NOTE — Anesthesia Postprocedure Evaluation (Signed)
  Anesthesia Post-op Note  Patient: [name]  Procedure(s) Performed: Procedure(s): LEFT THIGH SKIN GRAFT SPLIT THICKNESS (Left)  Patient Location: PACU  Anesthesia Type:MAC  Level of Consciousness: awake  Airway and Oxygen Therapy: Patient Spontanous Breathing  Post-op Pain: mild  Post-op Assessment: Post-op Vital signs reviewed  Post-op Vital Signs: Reviewed  Complications: No apparent anesthesia complications

## 2013-10-31 NOTE — H&P (Signed)
I have seen and examined the patient. I agree with the findings above.  I discussed with the patient the risks and benefits of split thickness skin grafting of his left thigh, including the possibility of infection, pain, graft failure, wound breakdown, DVT/ PE, loss of motion, and need for further surgery among others.  He understood these risks and wished to proceed.   [name],Swanson H, MD 10/31/2013 7:51 AM  r

## 2013-10-31 NOTE — Anesthesia Procedure Notes (Signed)
Procedure Name: LMA Insertion Date/Time: 10/31/2013 8:02 AM Performed by: Ellin GoodieWEAVER, Akilah Cureton M Pre-anesthesia Checklist: Patient identified, Emergency Drugs available, Suction available, Patient being monitored and Timeout performed Patient Re-evaluated:Patient Re-evaluated prior to inductionOxygen Delivery Method: Circle system utilized Preoxygenation: Pre-oxygenation with 100% oxygen Intubation Type: IV induction Ventilation: Mask ventilation without difficulty LMA: LMA with gastric port inserted LMA Size: 5.0 Number of attempts: 1 Tube secured with: Tape Comments: Easy atraumatic induction and LMA insertion.  Dr. Noreene LarssonJoslin verified placement.  Carlynn HeraldH Weaver, CRNA
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
## 2013-10-31 NOTE — Transfer of Care (Signed)
Immediate Anesthesia Transfer of Care Note  Patient: [name] BeachMichael G Vanderpool  Procedure(s) Performed: Procedure(s): LEFT THIGHT SKIN GRAFT SPLIT THICKNESS (Left)  Patient Location: PACU  Anesthesia Type:General  Level of Consciousness: awake, alert  and oriented  Airway & Oxygen Therapy: Patient connected to face mask oxygen  Post-op Assessment: Report given to PACU RN  Post vital signs: stable  Complications: No apparent anesthesia complications

## 2013-10-31 NOTE — Anesthesia Preprocedure Evaluation (Addendum)
Anesthesia Evaluation  Patient identified by MRN, date of birth, ID band Patient awake    Reviewed: Allergy & Precautions, H&P , NPO status , Patient's Chart, lab work & pertinent test results, reviewed documented beta blocker date and time   History of Anesthesia Complications (+) DIFFICULT AIRWAY  Airway Mallampati: II TM Distance: >3 FB Neck ROM: Full  Mouth opening: Limited Mouth Opening  Dental  (+) Teeth Intact, Dental Advisory Given   Pulmonary Current Smoker, former smoker,  breath sounds clear to auscultation        Cardiovascular Exercise Tolerance: Good Rhythm:Regular     Neuro/Psych Anxiety Depression    GI/Hepatic negative GI ROS, Neg liver ROS,   Endo/Other  negative endocrine ROS  Renal/GU negative Renal ROS     Musculoskeletal negative musculoskeletal ROS (+)   Abdominal (+)  Abdomen: soft. Bowel sounds: normal.  Peds  Hematology negative hematology ROS (+) anemia ,   Anesthesia Other Findings H/O difficult intubation with Grade IV view, video laryngoscope available if ETT used.   Reproductive/Obstetrics negative OB ROS                      Anesthesia Physical Anesthesia Plan  ASA: III  Anesthesia Plan:    Post-op Pain Management:    Induction: Intravenous  Airway Management Planned: Oral ETT  Additional Equipment:   Intra-op Plan:   Post-operative Plan: Extubation in OR  Informed Consent: I have reviewed the patients History and Physical, chart, labs and discussed the procedure including the risks, benefits and alternatives for the proposed anesthesia with the patient or authorized representative who has indicated his/her understanding and acceptance.   Dental advisory given  Plan Discussed with: CRNA, Anesthesiologist and Surgeon  Anesthesia Plan Comments:        Anesthesia Quick Evaluation

## 2013-11-01 ENCOUNTER — Encounter (HOSPITAL_COMMUNITY): Payer: Self-pay | Admitting: Orthopedic Surgery

## 2013-11-01 NOTE — Progress Notes (Signed)
Utilization review completed.  
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
## 2013-11-01 NOTE — Plan of Care (Signed)
Problem: Phase I Progression Outcomes Goal: Initial discharge plan identified Outcome: Completed/Met Date Met:  11/01/13 Patient reports plan for discharge is to return to [facility] for continued rehabilitation.
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
## 2013-11-03 MED ORDER — OXYCODONE-ACETAMINOPHEN 5-325 MG PO TABS: 1.0000 | ORAL_TABLET | ORAL | Status: DC | PRN

## 2013-11-03 MED ORDER — DSS 100 MG PO CAPS: 100.0000 mg | ORAL_CAPSULE | Freq: Two times a day (BID) | ORAL | Status: DC

## 2013-11-03 MED ORDER — OXYCODONE HCL 5 MG PO TABS: 5.0000 mg | ORAL_TABLET | ORAL | Status: DC | PRN

## 2013-11-03 NOTE — Progress Notes (Signed)
I saw and examined the patient with [name], communicating the findings and plan noted above.  Anirudh Sharlet Notaro, MD [facility] [phone] (p)  

## 2013-11-03 NOTE — Discharge Instructions (Signed)
Orthopaedic Trauma Service Discharge Instructions   General Discharge Instructions  WEIGHT BEARING STATUS: Weightbearing as tolerated Bilateral Lower Extremities   RANGE OF MOTION/ACTIVITY: activity as tolerated   Wound Care- dressing change on 11/06/2013      Dressing change for stsg recipient site as follows (left leg)  adaptic, 4x4, kerlix and ace wrap   Change q 3 days  Do not clean wound with any ointments, solutions, solvents  Donor site instructions (Right thigh)  DO NOT remove yellow xeroform layer for any reason       Pt to dab blood droplets as they form       Fan to xeroform layer to dry out site/sites      DO NOT cover donor sites with any dressings, allow them to stay exposed to air       Gently trim edges as they roll up  Diet: as you were eating previously.  Can use over the counter stool softeners and bowel preparations, such as Miralax, to help with bowel movements.  Narcotics can be constipating.  Be sure to drink plenty of fluids  STOP SMOKING OR USING NICOTINE PRODUCTS!!!!  As discussed nicotine severely impairs your body's ability to heal surgical and traumatic wounds but also impairs bone healing.  Wounds and bone heal by forming microscopic blood vessels (angiogenesis) and nicotine is a vasoconstrictor (essentially, shrinks blood vessels).  Therefore, if vasoconstriction occurs to these microscopic blood vessels they essentially disappear and are unable to deliver necessary nutrients to the healing tissue.  This is one modifiable factor that you can do to dramatically increase your chances of healing your injury.    (This means no smoking, no nicotine gum, patches, etc)  DO NOT USE NONSTEROIDAL ANTI-INFLAMMATORY DRUGS (NSAID'S)  Using products such as Advil (ibuprofen), Aleve (naproxen), Motrin (ibuprofen) for additional pain control during fracture healing can delay and/or prevent the healing response.  If you would like to take over the counter (OTC) medication,  Tylenol (acetaminophen) is ok.  However, some narcotic medications that are given for pain control contain acetaminophen as well. Therefore, you should not exceed more than 4000 mg of tylenol in a day if you do not have liver disease.  Also note that there are may OTC medicines, such as cold medicines and allergy medicines that my contain tylenol as well.  If you have any questions about medications and/or interactions please ask your doctor/PA or your pharmacist.   PAIN MEDICATION USE AND EXPECTATIONS  You have likely been given narcotic medications to help control your pain.  After a traumatic event that results in an fracture (broken bone) with or without surgery, it is ok to use narcotic pain medications to help control one's pain.  We understand that everyone responds to pain differently and each individual patient will be evaluated on a regular basis for the continued need for narcotic medications. Ideally, narcotic medication use should last no more than 6-8 weeks (coinciding with fracture healing).   As a patient it is your responsibility as well to monitor narcotic medication use and report the amount and frequency you use these medications when you come to your office visit.   We would also advise that if you are using narcotic medications, you should take a dose prior to therapy to maximize you participation.  IF YOU ARE ON NARCOTIC MEDICATIONS IT IS NOT PERMISSIBLE TO OPERATE A MOTOR VEHICLE (MOTORCYCLE/CAR/TRUCK/MOPED) OR HEAVY MACHINERY DO NOT MIX NARCOTICS WITH OTHER CNS (CENTRAL NERVOUS SYSTEM) DEPRESSANTS SUCH AS ALCOHOL  ICE AND ELEVATE INJURED/OPERATIVE EXTREMITY  Using ice and elevating the injured extremity above your heart can help with swelling and pain control.  Icing in a pulsatile fashion, such as 20 minutes on and 20 minutes off, can be followed.    Do not place ice directly on skin. Make sure there is a barrier between to skin and the ice pack.    Using frozen items  such as frozen peas works well as the conform nicely to the are that needs to be iced.  USE AN ACE WRAP OR TED HOSE FOR SWELLING CONTROL  In addition to icing and elevation, Ace wraps or TED hose are used to help limit and resolve swelling.  It is recommended to use Ace wraps or TED hose until you are informed to stop.    When using Ace Wraps start the wrapping distally (farthest away from the body) and wrap proximally (closer to the body)   Example: If you had surgery on your leg or thing and you do not have a splint on, start the ace wrap at the toes and work your way up to the thigh        If you had surgery on your upper extremity and do not have a splint on, start the ace wrap at your fingers and work your way up to the upper arm  IF YOU ARE IN A SPLINT OR CAST DO NOT REMOVE IT FOR ANY REASON   If your splint gets wet for any reason please contact the office immediately. You may shower in your splint or cast as long as you keep it dry.  This can be done by wrapping in a cast cover or garbage back (or similar)  Do Not stick any thing down your splint or cast such as pencils, money, or hangers to try and scratch yourself with.  If you feel itchy take benadryl as prescribed on the bottle for itching  IF YOU ARE IN A CAM BOOT (BLACK BOOT)  You may remove boot periodically. Perform daily dressing changes as noted below.  Wash the liner of the boot regularly and wear a sock when wearing the boot. It is recommended that you sleep in the boot until told otherwise  CALL THE OFFICE WITH ANY QUESTIONS OR CONCERTS: [phone]
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
## 2013-11-03 NOTE — Progress Notes (Signed)
Clinical social worker assisted with patient discharge to skilled nursing facility, [facility].  CSW addressed all family questions and concerns. CSW copied chart and added all important documents. CSW also set up patient transportation with [facility]. Clinical Social Worker will sign off for now as social work intervention is no longer needed.   Sabino NiemannAmy Joshuah Minella, MSW, [employer]CSWA [phone]

## 2013-11-03 NOTE — Progress Notes (Signed)
[facility] Report called to Efraim KaufmannMelissa, [facility] Patient left unit in a stable condition via EMS.

## 2013-11-03 NOTE — Progress Notes (Signed)
Orthopaedic Trauma Service Progress Note  Subjective  Doing well No complaints  Ready to go to facility    Objective   BP 118/56  Pulse 92  Temp(Src) 98.3 F (36.8 C) (Oral)  Resp 18  Ht 6\' 2"  (1.88 m)  Wt 86.637 kg (191 lb)  BMI 24.51 kg/m2  SpO2 94%  Intake/Output     04/02 0701 - 04/03 0700 04/03 0701 - 04/04 0700   P.O.  240   Total Intake(mL/kg)  240 (2.8)   Urine (mL/kg/hr) 1300 (0.6)    Drains     Total Output 1300     Net -1300 +240        Urine Occurrence 4 x 1 x   Stool Occurrence  1 x      Exam  Gen: awake and alert, NAD, comfortable  Lungs: clear, unlabored Cardiac: RRR Abd: soft, + BS Ext:       Right Thigh   Xeroform layer intact               Donor site looks excellent       Left Lower Extremity   Wound vac removed  STSG recipient site looks excellent  Beefy red granulation tissue under graft  Graft looks stable  Motor and sensory functions intact  Ext warm  + DP pulse     Assessment and Plan   POD/HD#: 21    63 y/o male s/p STSG L thigh wound  1. STSG L thigh             Ok to be bed to chair             WBAT B LEX               Dressing change for stsg recipient site as follows   adaptic, 4x4, kerlix and stump shrinker   Change q 3 days   Do not clean wound with any ointments, solutions, solvents    2. R thigh donor site             Donor site instructions                           DO NOT remove yellow xeroform layer for any reason                           Pt to dab blood droplets as they form                           Fan to xeroform layer to dry out site/sites                         DO NOT cover donor sites with any dressings, allow them to stay             exposed to air                           Gently trim edges as they roll up  3. Medical issues             Home meds  4. Pain control             Continue with current regiment  5. DVT/PE prophylaxis  Lovenox  6. ID             Continue  with Bactrim   7. Dispo            SNF today   Follow up with Ortho on 11/08/2013     Mearl LatinKeith W. Kenadi Miltner, PA-C [facility] [phone] (P) 11/03/2013 9:10 AM

## 2013-11-07 ENCOUNTER — Other Ambulatory Visit: Payer: Self-pay | Admitting: *Deleted

## 2013-11-07 MED ORDER — OXYCODONE-ACETAMINOPHEN 5-325 MG PO TABS: 1.0000 | ORAL_TABLET | ORAL | Status: DC | PRN

## 2013-11-07 NOTE — Telephone Encounter (Signed)
[facility] [place] 

## 2013-11-10 ENCOUNTER — Other Ambulatory Visit: Payer: Self-pay | Admitting: *Deleted

## 2013-11-10 MED ORDER — OXYCODONE HCL 5 MG PO TABS: 5.0000 mg | ORAL_TABLET | ORAL | Status: DC | PRN

## 2013-11-10 NOTE — Telephone Encounter (Signed)
Rx faxed to [facility] @ [phone] 

## 2013-11-15 ENCOUNTER — Non-Acute Institutional Stay (SKILLED_NURSING_FACILITY): Payer: Medicare Other | Admitting: Internal Medicine

## 2013-11-15 ENCOUNTER — Other Ambulatory Visit: Payer: Self-pay | Admitting: *Deleted

## 2013-11-15 DIAGNOSIS — S71102D Unspecified open wound, left thigh, subsequent encounter: Secondary | ICD-10-CM

## 2013-11-15 DIAGNOSIS — S8290XD Unspecified fracture of unspecified lower leg, subsequent encounter for closed fracture with routine healing: Secondary | ICD-10-CM

## 2013-11-15 DIAGNOSIS — L02419 Cutaneous abscess of limb, unspecified: Secondary | ICD-10-CM

## 2013-11-15 DIAGNOSIS — S329XXD Fracture of unspecified parts of lumbosacral spine and pelvis, subsequent encounter for fracture with routine healing: Secondary | ICD-10-CM

## 2013-11-15 DIAGNOSIS — L03119 Cellulitis of unspecified part of limb: Secondary | ICD-10-CM

## 2013-11-15 DIAGNOSIS — S82401E Unspecified fracture of shaft of right fibula, subsequent encounter for open fracture type I or II with routine healing: Secondary | ICD-10-CM

## 2013-11-15 DIAGNOSIS — Z5189 Encounter for other specified aftercare: Secondary | ICD-10-CM

## 2013-11-15 DIAGNOSIS — IMO0001 Reserved for inherently not codable concepts without codable children: Secondary | ICD-10-CM

## 2013-11-15 DIAGNOSIS — F209 Schizophrenia, unspecified: Secondary | ICD-10-CM

## 2013-11-15 MED ORDER — AMOXICILLIN-POT CLAVULANATE 875-125 MG PO TABS: 1.0000 | ORAL_TABLET | Freq: Two times a day (BID) | ORAL | Status: DC

## 2013-11-15 MED ORDER — OXYCODONE HCL 5 MG PO TABS: 5.0000 mg | ORAL_TABLET | ORAL | Status: DC | PRN

## 2013-11-15 NOTE — Telephone Encounter (Signed)
[facility] [place] 
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
## 2013-11-20 ENCOUNTER — Encounter (HOSPITAL_BASED_OUTPATIENT_CLINIC_OR_DEPARTMENT_OTHER): Payer: Medicare Other | Attending: Plastic Surgery

## 2013-11-20 DIAGNOSIS — Z87891 Personal history of nicotine dependence: Secondary | ICD-10-CM | POA: Insufficient documentation

## 2013-11-20 DIAGNOSIS — F209 Schizophrenia, unspecified: Secondary | ICD-10-CM | POA: Insufficient documentation

## 2013-11-20 DIAGNOSIS — Z79899 Other long term (current) drug therapy: Secondary | ICD-10-CM | POA: Insufficient documentation

## 2013-11-20 DIAGNOSIS — F411 Generalized anxiety disorder: Secondary | ICD-10-CM | POA: Insufficient documentation

## 2013-11-20 DIAGNOSIS — L97809 Non-pressure chronic ulcer of other part of unspecified lower leg with unspecified severity: Secondary | ICD-10-CM | POA: Insufficient documentation
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
## 2013-11-28 NOTE — Progress Notes (Signed)
[facility]  NAME:  [name], [name]               ACCOUNT NO.:  [id number]  MEDICAL RECORD NO.:  [id number]      DATE OF BIRTH:  08-14-1950  PHYSICIAN:  Wayland Denislaire Sanger, DO       VISIT DATE:  11/27/2013                                  OFFICE VISIT   The patient is a 63 year old male who is here for followup on his left leg ulcer.  Originally, it was from trauma.  He had a graft placement. Did not have full take.  He had collagen over this past week.  The hypergranulation tissue has markedly improved.  There is no change in his medication or social history.  On exam, he is alert and oriented and cooperative.  He seems to understand what we are talking about.  I am aware of his head injury.  He says he started with some protein but not with his vitamin C yet.  His breathing is unlabored.  His heart rate is regular.  He answers questions appropriately.  His wound is improved and the sizes are noted in the chart.  At this point, silver nitrate was used and an Endoform was placed. __________ chart.  We will continue with the dressing changes through home health with silver collagen and see him in 2 weeks.  He is going to get some blood work today for his prealbumin.     Wayland Denislaire Sanger, DO     CS/MEDQ  D:  11/27/2013  T:  11/28/2013  Job:  [id number]

## 2013-12-11 ENCOUNTER — Encounter (HOSPITAL_BASED_OUTPATIENT_CLINIC_OR_DEPARTMENT_OTHER): Payer: Medicare Other | Attending: Plastic Surgery

## 2013-12-11 DIAGNOSIS — L97809 Non-pressure chronic ulcer of other part of unspecified lower leg with unspecified severity: Secondary | ICD-10-CM | POA: Diagnosis not present

## 2013-12-12 NOTE — Progress Notes (Signed)
[facility]  NAME:  [name], [name]               ACCOUNT NO.:  [id number]  MEDICAL RECORD NO.:  [id number]      DATE OF BIRTH:  [birth year]  PHYSICIAN:  Wayland Denislaire Sanger, DO       VISIT DATE:  12/11/2013                                  OFFICE VISIT   The patient is a 63 year old male who is here for followup on his left leg ulcer secondary to trauma.  He had Endoform placed last week. Overall, the size has improved.  It is very clean and healthy looking. There has been no change in his medications or social history.  Home Health came on Thursday to change it.  On exam, he is alert, oriented, cooperative, not in any acute distress. He appears to be aware of the situation, a little confused about Home Health.  His breathing is unlabored.  His heart rate is regular.  His wound is clean, has excellent granulation tissue.  Another Endoform was placed, sizes are noted in the chart and will see him back in a week and have home health change the dressing on Thursday.     Wayland Denislaire Sanger, DO     CS/MEDQ  D:  12/11/2013  T:  12/12/2013  Job:  [id number]

## 2014-01-08 ENCOUNTER — Encounter (HOSPITAL_BASED_OUTPATIENT_CLINIC_OR_DEPARTMENT_OTHER): Payer: Medicare Other | Attending: Plastic Surgery

## 2014-01-08 DIAGNOSIS — T85698A Other mechanical complication of other specified internal prosthetic devices, implants and grafts, initial encounter: Secondary | ICD-10-CM | POA: Insufficient documentation

## 2014-01-08 DIAGNOSIS — L97109 Non-pressure chronic ulcer of unspecified thigh with unspecified severity: Secondary | ICD-10-CM | POA: Insufficient documentation

## 2014-01-08 DIAGNOSIS — Y838 Other surgical procedures as the cause of abnormal reaction of the patient, or of later complication, without mention of misadventure at the time of the procedure: Secondary | ICD-10-CM | POA: Diagnosis not present

## 2014-01-09 NOTE — Progress Notes (Signed)
[facility]  NAME:  [name], [name] NO.:  [id number]  MEDICAL RECORD NO.:  [id number]      DATE OF BIRTH:  08-25-1950  PHYSICIAN:  Wayland Denis, DO       VISIT DATE:  01/08/2014                                  OFFICE VISIT   The patient is a 63 year old male who is here for a followup on his left inner thigh ulcer secondary to trauma and skin graft failure.  He has been placing collagen on it.  Home health is seeing him twice a week. Overall, it has improved in its depth.  It does not appear to be infected.  It is getting smaller.  The sizes are noted in the chart. There is no change in his medication or social history.  PHYSICAL EXAMINATION:  GENERAL:  He is alert, oriented, cooperative, and not in any acute distress.  He is pleasant.  He has been taking a shower 2-3 times a week.  Before the home health nurse comes to do the dressing change, I have encouraged him to continue doing that with Dial soap.  He is not able to come next week, but the week after so we will see him back in 2 weeks and in the mean time, healthy eating, trying to cut back on his cigarette smoke, and dressing changes at least every other day with silver collagen.     Wayland Denis, DO     CS/MEDQ  D:  01/08/2014  T:  01/09/2014  Job:  [id number]

## 2014-01-22 DIAGNOSIS — T85698A Other mechanical complication of other specified internal prosthetic devices, implants and grafts, initial encounter: Secondary | ICD-10-CM | POA: Diagnosis not present

## 2014-01-22 DIAGNOSIS — L97109 Non-pressure chronic ulcer of unspecified thigh with unspecified severity: Secondary | ICD-10-CM | POA: Diagnosis not present

## 2014-01-23 NOTE — Progress Notes (Signed)
[facility]  NAME:  [name], [name]               ACCOUNT NO.:  [id number]  MEDICAL RECORD NO.:  [id number]      DATE OF BIRTH:  1951-01-27  PHYSICIAN:  Wayland Denislaire Sanger, DO       VISIT DATE:  01/22/2014                                  OFFICE VISIT   The patient is a 63 year old male who is here for followup on his left inner thigh ulcer.  He is overall doing better.  There is less hypergranulation, and it is epithelializing from the size.  He has been using collagen on this.  There is no change in his medications or social history.  He is alert, oriented, cooperative, not in any distress.  He is very pleasant and seems to be aware of what is going on.  I have placed an Endoform on it today, and we will have him change the Endoform on Wednesday with another piece and then after that he can go with collagen.  We will see him back in a week.     Wayland Denislaire Sanger, DO     CS/MEDQ  D:  01/22/2014  T:  01/23/2014  Job:  [id number]

## 2014-02-05 ENCOUNTER — Encounter (HOSPITAL_BASED_OUTPATIENT_CLINIC_OR_DEPARTMENT_OTHER): Payer: No Typology Code available for payment source | Attending: Plastic Surgery

## 2014-05-09 ENCOUNTER — Emergency Department (HOSPITAL_COMMUNITY): Payer: Medicare Other

## 2014-05-09 ENCOUNTER — Emergency Department (HOSPITAL_COMMUNITY)
Admission: EM | Admit: 2014-05-09 | Discharge: 2014-05-09 | Disposition: A | Discharge status: Home / Self Care | Payer: Medicare Other | Attending: Emergency Medicine | Admitting: Emergency Medicine

## 2014-05-09 ENCOUNTER — Encounter (HOSPITAL_COMMUNITY): Payer: Self-pay | Admitting: Emergency Medicine

## 2014-05-09 DIAGNOSIS — Z87828 Personal history of other (healed) physical injury and trauma: Secondary | ICD-10-CM | POA: Diagnosis not present

## 2014-05-09 DIAGNOSIS — R2241 Localized swelling, mass and lump, right lower limb: Secondary | ICD-10-CM | POA: Diagnosis present

## 2014-05-09 DIAGNOSIS — Z8781 Personal history of (healed) traumatic fracture: Secondary | ICD-10-CM | POA: Diagnosis not present

## 2014-05-09 DIAGNOSIS — Z8659 Personal history of other mental and behavioral disorders: Secondary | ICD-10-CM | POA: Insufficient documentation

## 2014-05-09 DIAGNOSIS — Z87891 Personal history of nicotine dependence: Secondary | ICD-10-CM | POA: Diagnosis not present

## 2014-05-09 NOTE — Progress Notes (Signed)
  CARE MANAGEMENT ED NOTE 05/09/2014  Patient:  Larene BeachHIDER,Precious G   Account Number:  [id number]  Date Initiated:  05/09/2014  Documentation initiated by:  Radford PaxFERRERO,Jenaye Rickert  Subjective/Objective Assessment:   Patient presents to ED with large soft tissue mass that was first noticed three months ago.     Subjective/Objective Assessment Detail:   Patient with pmhx of schizophrnia, anxiety, pelvic fracture, head injury.     Action/Plan:   Action/Plan Detail:   Anticipated DC Date:  05/09/2014     Status Recommendation to Physician:   Result of Recommendation:    Other ED Services  Consult Working Plan    DC Planning Services  CM consult  Other  PCP issues    Choice offered to / List presented to:            Status of service:  Completed, signed off  ED Comments:   ED Comments Detail:  [facility] speak to [facility] regarding finding a [facility]cp. EDCM spoke to patient at bedside.  [facility] [address] and that he has an appointment with his pcp on October 10th. Patient reports his pcp is unaware of this issue with his hip.  EDCM strongly encouraged patient to go to his appointment to his pcp so that he is made aware and can refer him to whomever is appropriate.  Patient verbalized understanding.  Discussed patient with EDP.  No further EDCM needs at this time.

## 2014-05-09 NOTE — ED Notes (Signed)
Pt alert and oriented x4. Respirations even and unlabored, bilateral symmetrical rise and fall of chest. Skin warm and dry. In no acute distress. Denies needs.   

## 2014-05-09 NOTE — ED Notes (Signed)
Pt to xray
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
## 2014-05-09 NOTE — ED Notes (Signed)
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  States that he has not seen a doctor for this.  Large mass noted to rt hip.  Mass is fluctuant but does feel harder than that of a lipoma. Area is not reddened and does not appear to be an abscess.

## 2014-05-09 NOTE — ED Notes (Signed)
Pt escorted to discharge window. Pt verbalized understanding discharge instructions. In no acute distress.  

## 2014-05-20 ENCOUNTER — Encounter: Payer: Self-pay | Admitting: Internal Medicine
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
## 2020-03-17 ENCOUNTER — Emergency Department (HOSPITAL_COMMUNITY): Payer: Medicare Other

## 2020-03-17 ENCOUNTER — Encounter (HOSPITAL_COMMUNITY): Payer: Self-pay

## 2020-03-17 ENCOUNTER — Other Ambulatory Visit: Payer: Self-pay

## 2020-03-17 ENCOUNTER — Emergency Department (HOSPITAL_COMMUNITY)
Admission: EM | Admit: 2020-03-17 | Discharge: 2020-03-17 | Disposition: A | Discharge status: Home / Self Care | Payer: Medicare Other | Attending: Emergency Medicine | Admitting: Emergency Medicine

## 2020-03-17 DIAGNOSIS — Z87891 Personal history of nicotine dependence: Secondary | ICD-10-CM | POA: Diagnosis not present

## 2020-03-17 DIAGNOSIS — W010XXA Fall on same level from slipping, tripping and stumbling without subsequent striking against object, initial encounter: Secondary | ICD-10-CM | POA: Diagnosis not present

## 2020-03-17 DIAGNOSIS — Y999 Unspecified external cause status: Secondary | ICD-10-CM | POA: Insufficient documentation

## 2020-03-17 DIAGNOSIS — Y9289 Other specified places as the place of occurrence of the external cause: Secondary | ICD-10-CM | POA: Diagnosis not present

## 2020-03-17 DIAGNOSIS — Y9301 Activity, walking, marching and hiking: Secondary | ICD-10-CM | POA: Insufficient documentation

## 2020-03-17 DIAGNOSIS — S42202A Unspecified fracture of upper end of left humerus, initial encounter for closed fracture: Secondary | ICD-10-CM | POA: Insufficient documentation

## 2020-03-17 DIAGNOSIS — W19XXXA Unspecified fall, initial encounter: Secondary | ICD-10-CM

## 2020-03-17 DIAGNOSIS — S4992XA Unspecified injury of left shoulder and upper arm, initial encounter: Secondary | ICD-10-CM | POA: Diagnosis present

## 2020-03-17 MED ORDER — OXYCODONE-ACETAMINOPHEN 5-325 MG PO TABS: 1.0000 | ORAL_TABLET | Freq: Four times a day (QID) | ORAL | 0 refills | Status: DC | PRN

## 2020-03-17 MED ORDER — IBUPROFEN 600 MG PO TABS: 600.0000 mg | ORAL_TABLET | Freq: Four times a day (QID) | ORAL | 0 refills | Status: DC | PRN

## 2020-03-17 MED ORDER — IBUPROFEN 200 MG PO TABS
600.0000 mg | ORAL_TABLET | Freq: Once | ORAL | Status: AC
  Administered 2020-03-17: 600 mg via ORAL
  Filled 2020-03-17: qty 3

## 2020-03-17 MED ORDER — OXYCODONE-ACETAMINOPHEN 5-325 MG PO TABS
1.0000 | ORAL_TABLET | Freq: Once | ORAL | Status: AC
  Administered 2020-03-17: 1 via ORAL
  Filled 2020-03-17: qty 1

## 2020-03-17 NOTE — Discharge Instructions (Addendum)
You have a fracture of your shoulder, please remain in sling immobilizer.  You can use prescribed ibuprofen and pain medication as well as ice to help with pain.  You will need to call to schedule follow-up appointment with Dr. Ophelia Charter for further evaluation and treatment of this fracture.
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
## 2020-04-19 ENCOUNTER — Encounter: Payer: Self-pay | Admitting: Family Medicine

## 2020-04-19 ENCOUNTER — Ambulatory Visit: Payer: Medicare Other | Admitting: Nurse Practitioner

## 2020-04-19 ENCOUNTER — Other Ambulatory Visit: Payer: Self-pay

## 2020-04-19 ENCOUNTER — Ambulatory Visit: Payer: Medicare Other | Attending: Family Medicine | Admitting: Family Medicine

## 2020-04-19 VITALS — BP 112/70 | HR 91 | Temp 97.2°F | Resp 18 | Ht 74.5 in | Wt 183.4 lb

## 2020-04-19 DIAGNOSIS — Z23 Encounter for immunization: Secondary | ICD-10-CM | POA: Diagnosis not present

## 2020-04-19 DIAGNOSIS — S42295A Other nondisplaced fracture of upper end of left humerus, initial encounter for closed fracture: Secondary | ICD-10-CM

## 2020-04-19 DIAGNOSIS — Z862 Personal history of diseases of the blood and blood-forming organs and certain disorders involving the immune mechanism: Secondary | ICD-10-CM

## 2020-04-19 DIAGNOSIS — Z789 Other specified health status: Secondary | ICD-10-CM

## 2020-04-19 DIAGNOSIS — Z7689 Persons encountering health services in other specified circumstances: Secondary | ICD-10-CM

## 2020-04-19 DIAGNOSIS — R7303 Prediabetes: Secondary | ICD-10-CM | POA: Diagnosis not present

## 2020-04-19 DIAGNOSIS — Z09 Encounter for follow-up examination after completed treatment for conditions other than malignant neoplasm: Secondary | ICD-10-CM

## 2020-04-19 DIAGNOSIS — F209 Schizophrenia, unspecified: Secondary | ICD-10-CM | POA: Diagnosis not present

## 2020-04-19 DIAGNOSIS — Z79899 Other long term (current) drug therapy: Secondary | ICD-10-CM

## 2020-04-19 MED ORDER — FAMOTIDINE 20 MG PO TABS: 20.0000 mg | ORAL_TABLET | Freq: Two times a day (BID) | ORAL | 1 refills | Status: DC

## 2020-04-19 MED ORDER — IBUPROFEN 600 MG PO TABS: 600.0000 mg | ORAL_TABLET | Freq: Three times a day (TID) | ORAL | 0 refills | Status: DC | PRN

## 2020-04-19 MED ORDER — OLANZAPINE 20 MG PO TABS: 20.0000 mg | ORAL_TABLET | Freq: Every day | ORAL | 1 refills | Status: DC
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
## 2020-04-19 NOTE — Progress Notes (Signed)
Requesting refill on Zyprexa, takes once daily, unsure of dose, will call pharmacy to confirm  Wants flu vaccine today, reports had 2nd dose of PNA vaccine last year at [facility] in [place]

## 2020-04-20 LAB — COMPREHENSIVE METABOLIC PANEL WITH GFR
ALT: 15 IU/L (ref 0–44)
AST: 19 IU/L (ref 0–40)
Albumin/Globulin Ratio: 1.6 (ref 1.2–2.2)
Albumin: 4.3 g/dL (ref 3.8–4.8)
Alkaline Phosphatase: 169 IU/L — ABNORMAL HIGH (ref 44–121)
BUN/Creatinine Ratio: 11 (ref 10–24)
BUN: 10 mg/dL (ref 8–27)
Bilirubin Total: 0.8 mg/dL (ref 0.0–1.2)
CO2: 25 mmol/L (ref 20–29)
Calcium: 9.5 mg/dL (ref 8.6–10.2)
Chloride: 103 mmol/L (ref 96–106)
Creatinine, Ser: 0.9 mg/dL (ref 0.76–1.27)
GFR calc Af Amer: 101 mL/min/1.73
GFR calc non Af Amer: 87 mL/min/1.73
Globulin, Total: 2.7 g/dL (ref 1.5–4.5)
Glucose: 71 mg/dL (ref 65–99)
Potassium: 4.7 mmol/L (ref 3.5–5.2)
Sodium: 142 mmol/L (ref 134–144)
Total Protein: 7 g/dL (ref 6.0–8.5)

## 2020-04-20 LAB — HEMOGLOBIN A1C
Est. average glucose Bld gHb Est-mCnc: 105 mg/dL
Hgb A1c MFr Bld: 5.3 % (ref 4.8–5.6)

## 2020-04-20 LAB — CBC
Hematocrit: 46 % (ref 37.5–51.0)
Hemoglobin: 15.2 g/dL (ref 13.0–17.7)
MCH: 31 pg (ref 26.6–33.0)
MCHC: 33 g/dL (ref 31.5–35.7)
MCV: 94 fL (ref 79–97)
Platelets: 199 x10E3/uL (ref 150–450)
RBC: 4.91 x10E6/uL (ref 4.14–5.80)
RDW: 12.7 % (ref 11.6–15.4)
WBC: 8.9 x10E3/uL (ref 3.4–10.8)

## 2020-04-22 ENCOUNTER — Telehealth: Payer: Self-pay

## 2020-04-22 NOTE — Telephone Encounter (Signed)
Attempted to contact the patient # [phone] to explain to him how to schedule medicaid transportation to medical appointments and to assist with scheduling an appointment with orthopedics. Message left with call back requested to this CM # [phone]

## 2020-04-23 ENCOUNTER — Telehealth: Payer: Self-pay

## 2020-04-23 NOTE — Telephone Encounter (Signed)
Attempted to contact the patient # [phone] again  to explain to him how to schedule medicaid transportation to medical appointments and to assist with scheduling an appointment with orthopedics. Message left with call back requested to this CM # [phone].  Call also placed to significant other listed, [employer] # [phone], message left with call back requested to this CM.

## 2020-04-24 ENCOUNTER — Telehealth: Payer: Self-pay

## 2020-04-24 NOTE — Telephone Encounter (Signed)
Someone called in stating that this is not the correct number for this pt. Please advise.

## 2020-04-24 NOTE — Telephone Encounter (Signed)
Call received from the patient.   Regarding the medicaid transportation to medical appointments, he said that he has the phone number for medicaid and he needs to call to have an assessment.  Informed him that this CM will call orthopedics to schedule the appointment.  He said he would like to follow up with [name] on [address].  Message sent to [facility] specialist inquiring if the ortho referral was sent to [name] handy's office.   Also informed him of his labs results.  As per [name] Jillyn Hidden :  Normal complete blood count. Normal Hgb A1c- blood sugar levels have been normal over the past 90 days. Normal electrolytes. Normal liver enzymes with exception of an increase in alkaline phosphatase but this enzyme is also found in bone and may be elevated due to patient's recent bone/humeral fracture.  He did not have any questions about the results.

## 2020-04-24 NOTE — Telephone Encounter (Signed)
Pt RC and was given lab results, voiced understanding

## 2020-04-30 ENCOUNTER — Telehealth: Payer: Self-pay

## 2020-04-30 NOTE — Telephone Encounter (Signed)
Attempted to contact the patient # [phone] to inform him that a referral has been sent to Dr Magdalene Patricia office and they should be calling him to schedule an appointment.  Message left with call back requested to this CM.

## 2020-05-07 ENCOUNTER — Telehealth: Payer: Self-pay | Admitting: Licensed Clinical Social Worker

## 2020-05-07 NOTE — Telephone Encounter (Signed)
Call placed to patient regarding I[facility] referral. LCSW left a message requesting a return call.

## 2020-05-15 ENCOUNTER — Ambulatory Visit: Payer: Medicare Other | Admitting: Family Medicine

## 2020-05-17 ENCOUNTER — Telehealth: Payer: Self-pay | Admitting: Licensed Clinical Social Worker

## 2020-05-17 NOTE — Telephone Encounter (Signed)
Call placed to patient regarding IBH referral. Pt shared that he is interested in obtaining independent housing. States that he receives $800 from [employer] and has additional income from a settlement.   Pt reports that he is needing to open a bank account; however, is unable due to not having an address. LCSW informed patient of the [employer] and provided their contact information. Pt was very appreciative for the information. Pt agreed to follow up with LCSW once he contact's the [facility].

## 2020-05-24 ENCOUNTER — Telehealth: Payer: Self-pay | Admitting: General Practice

## 2020-05-24 NOTE — Telephone Encounter (Signed)
Copied from CRM [id number]. Topic: General - Other >> May 24, 2020 10:03 AM Jaquita Rector A wrote: Reason for CRM: Selena Batten with [employer] called in with patient to speak to Jenel Lucks per patient it is ok to speak with Selena Batten if he is not present please call Kim at Ph# [phone] ext [phone]

## 2020-05-29 ENCOUNTER — Encounter: Payer: Self-pay | Admitting: Family Medicine

## 2020-05-29 ENCOUNTER — Ambulatory Visit: Payer: Medicare Other | Attending: Family Medicine | Admitting: Family Medicine

## 2020-05-29 ENCOUNTER — Other Ambulatory Visit: Payer: Self-pay

## 2020-05-29 VITALS — BP 138/85 | HR 98 | Temp 97.2°F | Wt 192.2 lb

## 2020-05-29 DIAGNOSIS — S42295D Other nondisplaced fracture of upper end of left humerus, subsequent encounter for fracture with routine healing: Secondary | ICD-10-CM

## 2020-05-29 DIAGNOSIS — F209 Schizophrenia, unspecified: Secondary | ICD-10-CM | POA: Diagnosis not present

## 2020-05-29 DIAGNOSIS — E785 Hyperlipidemia, unspecified: Secondary | ICD-10-CM

## 2020-05-29 DIAGNOSIS — I10 Essential (primary) hypertension: Secondary | ICD-10-CM

## 2020-05-29 DIAGNOSIS — S42295A Other nondisplaced fracture of upper end of left humerus, initial encounter for closed fracture: Secondary | ICD-10-CM

## 2020-05-29 DIAGNOSIS — N400 Enlarged prostate without lower urinary tract symptoms: Secondary | ICD-10-CM

## 2020-05-29 MED ORDER — OLANZAPINE 20 MG PO TABS: 20.0000 mg | ORAL_TABLET | Freq: Every day | ORAL | 0 refills | Status: DC

## 2020-05-29 MED ORDER — PRAVASTATIN SODIUM 80 MG PO TABS: 80.0000 mg | ORAL_TABLET | Freq: Every day | ORAL | 1 refills | Status: DC

## 2020-05-29 MED ORDER — PRAZOSIN HCL 1 MG PO CAPS: 1.0000 mg | ORAL_CAPSULE | Freq: Every day | ORAL | 0 refills | Status: DC

## 2020-05-29 MED ORDER — LISINOPRIL 5 MG PO TABS: 5.0000 mg | ORAL_TABLET | Freq: Every day | ORAL | 1 refills | Status: DC

## 2020-05-29 MED ORDER — BUSPIRONE HCL 15 MG PO TABS: 15.0000 mg | ORAL_TABLET | Freq: Every day | ORAL | 0 refills | Status: DC

## 2020-05-29 NOTE — Progress Notes (Signed)
Established Patient Office Visit  Subjective:  Patient ID: [name], male    DOB: 1950/12/13  Age: 69 y.o. MRN: [id number]  CC:  Chief Complaint  Patient presents with  . Follow-up    4 WEEKS    HPI [name], 69 year old male who recently moved back to the area from [facility], who presents in follow-up.  He is status post initial new patient visit to establish care on 04/19/2020 after a fall with left upper humeral fracture for which he was seen in the ER on 03/17/2020.  Patient reports that he missed the orthopedic appointment because Medicare transportation took him to the wrong orthopedic office.  He continues to have shoulder/upper arm pain as well as decreased ability to lift the left arm at the shoulder.       He reports history of schizophrenia and has not been able to establish with mental health care provider in the area.  He needs refills of all of his current medications including Zyprexa.  He also takes medication for high blood pressure and has history of urinary retention/BPH in addition to elevated cholesterol.  He reports that he feels well overall other than his continued shoulder pain.  Past Medical History:  Diagnosis Date  . Acute urinary retention   . Anxiety   . Brain injury (HCC) 1975  . Complication of anesthesia 08/29/13   difficulty due to large tongue, anterior larynx and limited opening  . Difficult intubation 08/31/13  . 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 Manic depression (HCC)   . Pelvic fracture (HCC) 09/21/13   Inferior Pubic Ramus, Left Superior Ramus  . Schizo affective schizophrenia (HCC)   . Schizophrenia [facility])     Past Surgical History:  Procedure Laterality Date  . COSMETIC SURGERY Left 1975   created new ear lobe  . DRESSING CHANGE UNDER ANESTHESIA Left 08/31/2013   Procedure: DRESSING CHANGE UNDER ANESTHESIA for right ankle and left thigh;  Surgeon: Budd Palmer, MD;  Location: [facility] OR;  Service: Orthopedics;   Laterality: Left;  . I & D EXTREMITY Bilateral 08/30/2013   Procedure: IRRIGATION AND DEBRIDEMENT with closure Left Thigh wound, Irrigation and debridement Right Ankle ;  Surgeon: Harvie Junior, MD;  Location: [facility] OR;  Service: Orthopedics;  Laterality: Bilateral;  . I & D EXTREMITY Left 09/26/2013   Procedure: LEFT IRRIGATION AND DEBRIDEMENT Robby Sermon;  Surgeon: Budd Palmer, MD;  Location: [facility] OR;  Service: Orthopedics;  Laterality: Left;  . I & D EXTREMITY Left 10/02/2013   Procedure: IRRIGATION AND DEBRIDEMENT EXTREMITY;  Surgeon: Budd Palmer, MD;  Location: [facility] OR;  Service: Orthopedics;  Laterality: Left;  . INCISION AND DRAINAGE OF WOUND Left 09/21/2013   Procedure: IRRIGATION AND DEBRIDEMENT SOFT TISSUE WOUND WITH LARGE WOUND VAC PLACEMENT;  Surgeon: Budd Palmer, MD;  Location: [facility] OR;  Service: Orthopedics;  Laterality: Left;  Marland Kitchen MANDIBLE FRACTURE SURGERY  1975  . SACRO-ILIAC PINNING Left 08/31/2013   Procedure: LEFT SACRO-ILIAC PINNING;  Surgeon: Budd Palmer, MD;  Location: [facility] OR;  Service: Orthopedics;  Laterality: Left;  . SKIN GRAFT    . SKIN GRAFT Left 10/31/2013   left thigh      DR HANDY   . SKIN SPLIT GRAFT Left 10/02/2013   Procedure: SKIN GRAFT SPLIT THICKNESS;  Surgeon: Budd Palmer, MD;  Location: [facility] OR;  Service: Orthopedics;  Laterality: Left;  . SKIN SPLIT GRAFT Left 10/31/2013   Procedure:  LEFT THIGH SKIN GRAFT SPLIT THICKNESS;  Surgeon: Budd Palmer, MD;  Location: [facility] OR;  Service: Orthopedics;  Laterality: Left;    Family History  Problem Relation Age of Onset  . Diabetes Mother   . Obsessive Compulsive Disorder Father   . Bone cancer Father     Social History   Socioeconomic History  . Marital status: Single    Spouse name: Not on file  . Number of children: Not on file  . Years of education: Not on file  . Highest education level: Not on file  Occupational History  . Not on file  Tobacco Use  . Smoking status: Current Every Day  Smoker    Packs/day: 1.50    Types: Cigarettes    Last attempt to quit: 08/29/2013    Years since quitting: 6.7  . Smokeless tobacco: Never Used  Vaping Use  . Vaping Use: Never used  Substance and Sexual Activity  . Alcohol use: No  . Drug use: No  . Sexual activity: Never    Comment: Quit smolikg 08/29/2013  Other Topics Concern  . Not on file  Social History Narrative  . Not on file   Social Determinants of Health   Financial Resource Strain:   . Difficulty of Paying Living Expenses: Not on file  Food Insecurity:   . Worried About Programme researcher, broadcasting/film/video in the Last Year: Not on file  . Ran Out of Food in the Last Year: Not on file  Transportation Needs:   . Lack of Transportation (Medical): Not on file  . Lack of Transportation (Non-Medical): Not on file  Physical Activity:   . Days of Exercise per Week: Not on file  . Minutes of Exercise per Session: Not on file  Stress:   . Feeling of Stress : Not on file  Social Connections:   . Frequency of Communication with Friends and Family: Not on file  . Frequency of Social Gatherings with Friends and Family: Not on file  . Attends Religious Services: Not on file  . Active Member of Clubs or Organizations: Not on file  . Attends Banker Meetings: Not on file  . Marital Status: Not on file  Intimate Partner Violence:   . Fear of Current or Ex-Partner: Not on file  . Emotionally Abused: Not on file  . Physically Abused: Not on file  . Sexually Abused: Not on file    Outpatient Medications Prior to Visit  Medication Sig Dispense Refill  . busPIRone (BUSPAR) 15 MG tablet Take 15 mg by mouth daily.    . famotidine (PEPCID) 20 MG tablet Take 1 tablet (20 mg total) by mouth 2 (two) times daily. To reduce stomach acid 180 tablet 1  . folic acid (FOLVITE) 1 MG tablet Take 1 mg by mouth daily.    Marland Kitchen ibuprofen (ADVIL) 600 MG tablet Take 1 tablet (600 mg total) by mouth every 8 (eight) hours as needed for moderate pain.  Take after eating 90 tablet 0  . lisinopril (ZESTRIL) 5 MG tablet Take 5 mg by mouth daily.    Marland Kitchen OLANZapine (ZYPREXA) 20 MG tablet Take 1 tablet (20 mg total) by mouth at bedtime. 30 tablet 1  . pravastatin (PRAVACHOL) 80 MG tablet Take 80 mg by mouth at bedtime.    . prazosin (MINIPRESS) 1 MG capsule Take 1 mg by mouth at bedtime.    . senna-docusate (SENOKOT-S) 8.6-50 MG tablet Take 1 tablet by mouth daily.    Marland Kitchen  oxyCODONE-acetaminophen (PERCOCET) 5-325 MG tablet Take 1 tablet by mouth every 6 (six) hours as needed. (Patient not taking: Reported on 04/19/2020) 8 tablet 0   No facility-administered medications prior to visit.    Allergies  Allergen Reactions  . Penicillins Other (See Comments)    Unsure of reaction, had allergic reaction as a child and has not taken since then    ROS Review of Systems  Constitutional: Positive for fatigue. Negative for chills and fever.  HENT: Negative for sore throat and trouble swallowing.   Respiratory: Negative for cough and shortness of breath.   Cardiovascular: Negative for chest pain and palpitations.  Gastrointestinal: Negative for abdominal pain, constipation, diarrhea and nausea.  Endocrine: Negative for polydipsia, polyphagia and polyuria.  Genitourinary: Negative for dysuria and frequency.  Musculoskeletal: Positive for arthralgias and back pain (chronic).  Neurological: Negative for dizziness and headaches.  Hematological: Negative for adenopathy.  Psychiatric/Behavioral: Negative for suicidal ideas. The patient is nervous/anxious (Stable with medication).       Objective:    Physical Exam Vitals and nursing note reviewed.  Constitutional:      Appearance: Normal appearance.  Cardiovascular:     Rate and Rhythm: Normal rate and regular rhythm.  Pulmonary:     Effort: Pulmonary effort is normal.     Breath sounds: Normal breath sounds.  Abdominal:     Palpations: Abdomen is soft.     Tenderness: There is no abdominal  tenderness. There is no guarding or rebound.  Musculoskeletal:        General: Tenderness (Left upper arm near shoulder) present.     Right lower leg: No edema.     Left lower leg: No edema.  Skin:    General: Skin is warm and dry.  Neurological:     General: No focal deficit present.     Mental Status: He is alert and oriented to person, place, and time.  Psychiatric:        Mood and Affect: Mood normal.        Behavior: Behavior normal.     BP (!) 144/96 (BP Location: Right Arm, Patient Position: Sitting)   Pulse (!) 116   Temp (!) 97.2 F (36.2 C)   Wt 192 lb 3.2 oz (87.2 kg)   SpO2 90%   BMI 24.35 kg/m  Wt Readings from Last 3 Encounters:  05/29/20 192 lb 3.2 oz (87.2 kg)  04/19/20 183 lb 6.4 oz (83.2 kg)  11/15/13 190 lb 3.2 oz (86.3 kg)     Health Maintenance Due  Topic Date Due  . Hepatitis C Screening  Never done  . COLONOSCOPY  Never done  . PNA vac Low Risk Adult (1 of 2 - PCV13) 01/19/2017      No results found for: TSH Lab Results  Component Value Date   WBC 8.9 04/19/2020   HGB 15.2 04/19/2020   HCT 46.0 04/19/2020   MCV 94 04/19/2020   PLT 199 04/19/2020   Lab Results  Component Value Date   NA 142 04/19/2020   K 4.7 04/19/2020   CO2 25 04/19/2020   GLUCOSE 71 04/19/2020   BUN 10 04/19/2020   CREATININE 0.90 04/19/2020   BILITOT 0.8 04/19/2020   ALKPHOS 169 (H) 04/19/2020   AST 19 04/19/2020   ALT 15 04/19/2020   PROT 7.0 04/19/2020   ALBUMIN 4.3 04/19/2020   CALCIUM 9.5 04/19/2020   No results found for: CHOL No results found for: HDL No results found for: LDLCALC No  results found for: TRIG No results found for: CHOLHDL Lab Results  Component Value Date   HGBA1C 5.3 04/19/2020      Assessment & Plan:  1. Schizophrenia, unspecified type [facility](HCC) Patient has not yet established care with a local mental health professional and new referral placed to psychiatry.  Refills provided of his current medications for 90 days. - busPIRone  (BUSPAR) 15 MG tablet; Take 1 tablet (15 mg total) by mouth daily.  Dispense: 90 tablet; Refill: 0 - OLANZapine (ZYPREXA) 20 MG tablet; Take 1 tablet (20 mg total) by mouth at bedtime.  Dispense: 90 tablet; Refill: 0 - Ambulatory referral to Psychiatry  2. Essential hypertension Blood pressure is currently well controlled and refill provided of lisinopril 5 mg daily. - lisinopril (ZESTRIL) 5 MG tablet; Take 1 tablet (5 mg total) by mouth daily. To lower blood pressure  Dispense: 90 tablet; Refill: 1  3. Other closed nondisplaced fracture of proximal end of left humerus, subsequent encounter Referral again placed to orthopedics in follow-up of patient's humeral fracture.  Patient reports that he was taken to the wrong orthopedic office by Medicare transportation and missed his appointment.  Message was sent to RN care manager for this office as well regarding patient's issue with Medicare transportation and assistance in rescheduling his orthopedic appointment. - AMB referral to orthopedics  4. Hyperlipidemia, unspecified hyperlipidemia type Refill provided for pravastatin for continued treatment of hyperlipidemia. - pravastatin (PRAVACHOL) 80 MG tablet; Take 1 tablet (80 mg total) by mouth at bedtime.  Dispense: 90 tablet; Refill: 1  5. Benign prostatic hyperplasia, unspecified whether lower urinary tract symptoms present Refill provided of Minipress for BPH treatment. - prazosin (MINIPRESS) 1 MG capsule; Take 1 capsule (1 mg total) by mouth at bedtime.  Dispense: 90 capsule; Refill: 0     Follow-up: Return in about 3 months (around 08/29/2020) for Chronic issues, sooner if needed.   Cain Saupeammie Alisa Stjames, MD

## 2020-05-29 NOTE — Progress Notes (Signed)
NEEDS REFILL ON BUSPAR, ZYPREXA,PRAZOSIN,PRAVASTATIN AND LINSINOPRIL

## 2020-05-31 NOTE — Telephone Encounter (Signed)
Return call placed to Kim [phone] with Michelle Nasuti. Ms. Selena Batten shared that she has been working closely with patient to assist with obtaining independent housing. Currently, pt enjoys staying at a local hotel; however, he is in need of a permanent address to obtain a direct deposit for an awarded settlement.   Pt has a scheduled appointment with Adult Protective Services in November to address housing concerns, to which Ms. Selena Batten will accompany him for support. She reports reaching out to [employer] to inquire about mail services; however, they were not helpful.   Plan: -LCSW will contact [employer], pt's preffered bank, to inquire about eligibility for initiating a bank account -LCSW will complete Case Management referral to [facility] to strengthen support for patient regarding psychosocial stressors  -Pt has not heard from Orthopedic referral and is requesting follow up (message will be routed to Referral Coordinator)  LCSW placed call to patient to obtain his consent to complete referral to [facility]. LCSW left message requesting a return call.

## 2020-06-07 NOTE — Telephone Encounter (Signed)
Patient was scheduled to see Dr Myrene Galas on 05-15-2020 @ 10:15am  And he no showed to his appointment .   I called Mrs Selena Batten  she is aware of that and Mr  Homes was suppose to call and reschedule .  Mrs Selena Batten is going to contact Mr Fehl to remind him about re schedule appt .

## 2020-07-03 ENCOUNTER — Telehealth: Payer: Self-pay | Admitting: Licensed Clinical Social Worker

## 2020-07-03 NOTE — Telephone Encounter (Signed)
Follow up call placed to patient. LCSW inquired about his ability to open a bank account to initiate direct deposit of funds. Pt shared that he plans on opening an account on Friday, December 3, 21. He is continuing to work with Ms. Selena Batten, through Michelle Nasuti and has no additional concerns or needs.   LCSW strongly encouraged patient to contact clinic with any questions or concerns. Pt verbalized understanding.

## 2020-07-20 ENCOUNTER — Ambulatory Visit: Payer: Medicare Other | Attending: Internal Medicine

## 2020-07-20 DIAGNOSIS — Z23 Encounter for immunization: Secondary | ICD-10-CM

## 2020-07-20 NOTE — Progress Notes (Signed)
   Covid-19 Vaccination Clinic  Name:  [name]    MRN: [id number] DOB: 08-13-1950  07/20/2020  [name] was observed post Covid-19 immunization for 15 minutes without incident. He was provided with Vaccine Information Sheet and instruction to access the V-Safe system.   [name] was instructed to call 911 with any severe reactions post vaccine: Marland Kitchen Difficulty breathing  . Swelling of face and throat  . A fast heartbeat  . A bad rash all over body  . Dizziness and weakness   Immunizations Administered    Name Date Dose VIS Date Route   Moderna Covid-19 Booster Vaccine 07/20/2020  9:46 AM 0.25 mL 05/22/2020 Intramuscular   Manufacturer: Gala Murdoch   Lot: [id number]   NDC: 75300-511-02

## 2020-09-03 ENCOUNTER — Other Ambulatory Visit: Payer: Self-pay | Admitting: General Practice

## 2020-09-03 DIAGNOSIS — N400 Enlarged prostate without lower urinary tract symptoms: Secondary | ICD-10-CM

## 2020-09-03 DIAGNOSIS — F209 Schizophrenia, unspecified: Secondary | ICD-10-CM

## 2020-09-03 DIAGNOSIS — E785 Hyperlipidemia, unspecified: Secondary | ICD-10-CM

## 2020-09-03 NOTE — Telephone Encounter (Signed)
Notes to clinic:  medication were last filled by Cammie Fulp She is no longer at the practice  Review for refill    Requested Prescriptions  Pending Prescriptions Disp Refills   busPIRone (BUSPAR) 15 MG tablet 90 tablet 0    Sig: Take 1 tablet (15 mg total) by mouth daily.      Psychiatry: Anxiolytics/Hypnotics - Non-controlled Passed - 09/03/2020 12:08 PM      Passed - Valid encounter within last 6 months    Recent Outpatient Visits           3 months ago Essential hypertension   [facility] F[place], [place], MD   4 months ago Other closed nondisplaced fracture of proximal end of left humerus, initial encounter   [facility] Fulp, [place], MD                  OLANZapine (ZYPREXA) 20 MG tablet 90 tablet 0    Sig: Take 1 tablet (20 mg total) by mouth at bedtime.      Not Delegated - Psychiatry:  Antipsychotics - Second Generation (Atypical) - olanzapine Failed - 09/03/2020 12:08 PM      Failed - This refill cannot be delegated      Passed - ALT in normal range and within 360 days    ALT  Date Value Ref Range Status  04/19/2020 15 0 - 44 IU/L Final          Passed - AST in normal range and within 360 days    AST  Date Value Ref Range Status  04/19/2020 19 0 - 40 IU/L Final          Passed - Last BP in normal range    BP Readings from Last 1 Encounters:  05/29/20 138/85          Passed - Valid encounter within last 6 months    Recent Outpatient Visits           3 months ago Essential hypertension   [facility] [place], [place], MD   4 months ago Other closed nondisplaced fracture of proximal end of left humerus, initial encounter   [facility] Fulp, [place], MD                  pravastatin (PRAVACHOL) 80 MG tablet 90 tablet 1    Sig: Take 1 tablet (80 mg total) by mouth at bedtime.      Cardiovascular:  Antilipid - Statins Failed -  09/03/2020 12:08 PM      Failed - Total Cholesterol in normal range and within 360 days    No results found for: CHOL, POCCHOL, CHOLTOT        Failed - LDL in normal range and within 360 days    No results found for: LDLCALC, LDLC, HIRISKLDL, POCLDL, LDLDIRECT, REALLDLC, TOTLDLC        Failed - HDL in normal range and within 360 days    No results found for: HDL, POCHDL        Failed - Triglycerides in normal range and within 360 days    No results found for: TRIG, POCTRIG        Passed - Patient is not pregnant      Passed - Valid encounter within last 12 months    Recent Outpatient Visits           3 months ago  Essential hypertension   [facility] Fulp, [place], MD   4 months ago Other closed nondisplaced fracture of proximal end of left humerus, initial encounter   [facility] Fulp, [place], MD                  prazosin (MINIPRESS) 1 MG capsule 90 capsule 0    Sig: Take 1 capsule (1 mg total) by mouth at bedtime.      Cardiovascular:  Alpha Blockers Passed - 09/03/2020 12:08 PM      Passed - Last BP in normal range    BP Readings from Last 1 Encounters:  05/29/20 138/85          Passed - Valid encounter within last 6 months    Recent Outpatient Visits           3 months ago Essential hypertension   [facility] [place], [place], MD   4 months ago Other closed nondisplaced fracture of proximal end of left humerus, initial encounter   [facility] [place], [place], MD

## 2020-09-03 NOTE — Telephone Encounter (Signed)
Medication Refill - Medication: prazosin (MINIPRESS) 1 MG capsule pravastatin (PRAVACHOL) 80 MG tablet busPIRone (BUSPAR) 15 MG tablet OLANZapine (ZYPREXA) 20 MG tablet   Has the patient contacted their pharmacy? Yes.  PT stated he needs to contact his doctors office.    Preferred Pharmacy (with phone number or street name):  [facility], [place] - [address]  [address] [place] [address]  Phone: [phone] Fax: [phone]    Agent: Please be advised that RX refills may take up to 3 business days. We ask that you follow-up with your pharmacy. *please update patients PCP in Epic.

## 2020-09-04 MED ORDER — BUSPIRONE HCL 15 MG PO TABS: 15.0000 mg | ORAL_TABLET | Freq: Every day | ORAL | 0 refills | Status: DC

## 2020-09-04 MED ORDER — PRAZOSIN HCL 1 MG PO CAPS: 1.0000 mg | ORAL_CAPSULE | Freq: Every day | ORAL | 0 refills | Status: DC

## 2020-09-04 MED ORDER — PRAVASTATIN SODIUM 80 MG PO TABS: 80.0000 mg | ORAL_TABLET | Freq: Every day | ORAL | 1 refills | Status: DC

## 2020-09-06 ENCOUNTER — Telehealth: Payer: Self-pay | Admitting: General Practice

## 2020-09-06 NOTE — Telephone Encounter (Signed)
Copied from CRM [id number]. Topic: General - Other >> Sep 06, 2020  9:32 AM Wyonia Hough E wrote: Reason for CRM: there is a legal form that has to be signed by whoever prescribes Pt his Psychotropic meds / this form is to get Pt help with paid services to help manage his money/ please advise asap

## 2020-09-11 NOTE — Telephone Encounter (Signed)
Call placed to patient, in addition, to Delphina with [facility] [phone]. LCSW left messages for both parties requesting a return call.

## 2020-09-16 ENCOUNTER — Telehealth: Payer: Self-pay | Admitting: Licensed Clinical Social Worker

## 2020-09-16 NOTE — Telephone Encounter (Signed)
Incoming call received from Delphina with [facility] [phone]. Ms. [name] shared that she is working with patient and his attorneys on obtaining a payee to assist with his management of funds. She is requesting PCP to complete required paperwork to complete process. LCSW provided clinic's fax number and routed message to provider for advisement.

## 2020-09-17 ENCOUNTER — Encounter (HOSPITAL_COMMUNITY): Payer: Self-pay | Admitting: Psychiatry

## 2020-09-17 ENCOUNTER — Other Ambulatory Visit: Payer: Self-pay

## 2020-09-17 ENCOUNTER — Ambulatory Visit (INDEPENDENT_AMBULATORY_CARE_PROVIDER_SITE_OTHER): Payer: Medicare Other | Admitting: Psychiatry

## 2020-09-17 DIAGNOSIS — N400 Enlarged prostate without lower urinary tract symptoms: Secondary | ICD-10-CM

## 2020-09-17 DIAGNOSIS — F209 Schizophrenia, unspecified: Secondary | ICD-10-CM | POA: Diagnosis not present

## 2020-09-17 MED ORDER — OLANZAPINE 20 MG PO TABS
20.0000 mg | ORAL_TABLET | Freq: Every day | ORAL | 2 refills | Status: DC
Start: 2020-09-17 — End: 2020-12-12

## 2020-09-17 MED ORDER — BUSPIRONE HCL 15 MG PO TABS
15.0000 mg | ORAL_TABLET | Freq: Every day | ORAL | 2 refills | Status: DC
Start: 2020-09-17 — End: 2020-12-12

## 2020-09-17 MED ORDER — PRAZOSIN HCL 1 MG PO CAPS
1.0000 mg | ORAL_CAPSULE | Freq: Every day | ORAL | 2 refills | Status: DC
Start: 2020-09-17 — End: 2020-12-12
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
## 2020-09-22 NOTE — Telephone Encounter (Signed)
I do not see this patient until March 23

## 2020-09-27 ENCOUNTER — Telehealth: Payer: Self-pay | Admitting: Licensed Clinical Social Worker

## 2020-09-27 NOTE — Telephone Encounter (Signed)
Call placed to Ms. [name] with [facility]. LCSW informed her that pt's PCP will discuss request at upcoming appointment. Ms. [name] shared that she is in the process of having pt's checks forwarded to the [facility]) She is hoping to accompany pt at his upcoming appointment. No additional concerns noted.   LCSW left a detailed message for pt stating that PCP will discuss paperwork at upcoming appointment.

## 2020-10-23 ENCOUNTER — Ambulatory Visit: Payer: Medicare Other | Attending: Nurse Practitioner | Admitting: Nurse Practitioner

## 2020-10-23 ENCOUNTER — Other Ambulatory Visit: Payer: Self-pay

## 2020-10-23 ENCOUNTER — Encounter: Payer: Self-pay | Admitting: Nurse Practitioner

## 2020-10-23 DIAGNOSIS — I10 Essential (primary) hypertension: Secondary | ICD-10-CM

## 2020-10-23 DIAGNOSIS — N401 Enlarged prostate with lower urinary tract symptoms: Secondary | ICD-10-CM

## 2020-10-23 DIAGNOSIS — D649 Anemia, unspecified: Secondary | ICD-10-CM

## 2020-10-23 DIAGNOSIS — R7303 Prediabetes: Secondary | ICD-10-CM

## 2020-10-23 DIAGNOSIS — E785 Hyperlipidemia, unspecified: Secondary | ICD-10-CM

## 2020-10-23 DIAGNOSIS — Z1211 Encounter for screening for malignant neoplasm of colon: Secondary | ICD-10-CM

## 2020-10-23 DIAGNOSIS — R338 Other retention of urine: Secondary | ICD-10-CM

## 2020-10-23 MED ORDER — LISINOPRIL 5 MG PO TABS: 5.0000 mg | ORAL_TABLET | Freq: Every day | ORAL | 1 refills | Status: DC
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
## 2020-10-24 ENCOUNTER — Ambulatory Visit (HOSPITAL_COMMUNITY): Payer: Medicaid Other | Admitting: Psychiatry

## 2020-10-24 LAB — PSA: Prostate Specific Ag, Serum: 4.9 ng/mL — ABNORMAL HIGH (ref 0.0–4.0)

## 2020-10-24 LAB — CBC
Hematocrit: 50.8 % (ref 37.5–51.0)
Hemoglobin: 17.1 g/dL (ref 13.0–17.7)
MCH: 30.9 pg (ref 26.6–33.0)
MCHC: 33.7 g/dL (ref 31.5–35.7)
MCV: 92 fL (ref 79–97)
Platelets: 281 10*3/uL (ref 150–450)
RBC: 5.53 x10E6/uL (ref 4.14–5.80)
RDW: 13.6 % (ref 11.6–15.4)
WBC: 10.4 10*3/uL (ref 3.4–10.8)

## 2020-10-24 LAB — CMP14+EGFR
ALT: 14 IU/L (ref 0–44)
AST: 19 IU/L (ref 0–40)
Albumin/Globulin Ratio: 1.4 (ref 1.2–2.2)
Albumin: 4.1 g/dL (ref 3.8–4.8)
Alkaline Phosphatase: 144 IU/L — ABNORMAL HIGH (ref 44–121)
BUN/Creatinine Ratio: 16 (ref 10–24)
BUN: 15 mg/dL (ref 8–27)
Bilirubin Total: 0.5 mg/dL (ref 0.0–1.2)
CO2: 21 mmol/L (ref 20–29)
Calcium: 9.8 mg/dL (ref 8.6–10.2)
Chloride: 103 mmol/L (ref 96–106)
Creatinine, Ser: 0.95 mg/dL (ref 0.76–1.27)
Globulin, Total: 3 g/dL (ref 1.5–4.5)
Glucose: 99 mg/dL (ref 65–99)
Potassium: 4.3 mmol/L (ref 3.5–5.2)
Sodium: 142 mmol/L (ref 134–144)
Total Protein: 7.1 g/dL (ref 6.0–8.5)
eGFR: 87 mL/min/{1.73_m2} (ref >59)

## 2020-10-24 LAB — LIPID PANEL
Chol/HDL Ratio: 7 ratio — ABNORMAL HIGH (ref 0.0–5.0)
Cholesterol, Total: 274 mg/dL — ABNORMAL HIGH (ref 100–199)
HDL: 39 mg/dL — ABNORMAL LOW (ref >39)
LDL Chol Calc (NIH): 151 mg/dL — ABNORMAL HIGH (ref 0–99)
Triglycerides: 447 mg/dL — ABNORMAL HIGH (ref 0–149)
VLDL Cholesterol Cal: 84 mg/dL — ABNORMAL HIGH (ref 5–40)

## 2020-10-24 LAB — TSH: TSH: 1.25 u[IU]/mL (ref 0.450–4.500)

## 2020-10-24 LAB — HEMOGLOBIN A1C
Est. average glucose Bld gHb Est-mCnc: 111 mg/dL
Hgb A1c MFr Bld: 5.5 % (ref 4.8–5.6)

## 2020-12-12 ENCOUNTER — Other Ambulatory Visit: Payer: Self-pay

## 2020-12-12 ENCOUNTER — Encounter (HOSPITAL_COMMUNITY): Payer: Self-pay | Admitting: Psychiatry

## 2020-12-12 ENCOUNTER — Ambulatory Visit (INDEPENDENT_AMBULATORY_CARE_PROVIDER_SITE_OTHER): Payer: Medicare Other | Admitting: Psychiatry

## 2020-12-12 DIAGNOSIS — F209 Schizophrenia, unspecified: Secondary | ICD-10-CM

## 2020-12-12 DIAGNOSIS — N400 Enlarged prostate without lower urinary tract symptoms: Secondary | ICD-10-CM

## 2020-12-12 MED ORDER — PRAZOSIN HCL 1 MG PO CAPS: 1.0000 mg | ORAL_CAPSULE | Freq: Every day | ORAL | 2 refills | Status: DC

## 2020-12-12 MED ORDER — BUSPIRONE HCL 15 MG PO TABS: 15.0000 mg | ORAL_TABLET | Freq: Every day | ORAL | 2 refills | Status: DC

## 2020-12-12 MED ORDER — OLANZAPINE 20 MG PO TABS: 20.0000 mg | ORAL_TABLET | Freq: Every day | ORAL | 2 refills | Status: DC

## 2020-12-12 NOTE — Progress Notes (Signed)
BH MD/PA/NP OP Progress Note  12/12/2020 11:31 AM [name]  MRN:  [id number]  Chief Complaint: " The meds are the best meds I have ever had.  I am doing good" Chief Complaint    Medication Management     HPI: 70 year old male seen today for follow up psychiatric evaluation  He has a psychiatric history of schizophrenia, schizoaffective disorder, bipolar disorder, tobacco use, and PTSD.  He is currently managed on Zyprexa 20 mg daily, prazosin 1 mg nightly, and BuSpar 15 mg 3 times daily.  Today he notes his medications are effective in managing his psychiatric conditions.  Today patient is fairly groomed, pleasant, cooperative, engaged in conversation, and maintained eye contact.  He informed provider that he is doing well and notes that he is medications are the best that he has ever had.  He notes that last Friday he received  funds from a settlement that he had won.  He notes that he took the funds and is now living in a [employer].  He notes that he likes [name] more than the rooming home he was staying in prior because it safer.  Today he notes that he has minimal anxiety and depression.  Provider conducted a GAD-7 and patient scored a 3, at his last visit he scored 8.  Provider also conducted a PHQ-9 and patient scored a 3, at his last visit he scored a 7.  He endorses adequate sleep and appetite.  Today he denies SI/HI/VAH, mania, or paranoia.   No medication changes made today.  Patient agreeable to continue medications as prescribed.  No other concerns noted at this time. Visit Diagnosis:    ICD-10-CM   1. Schizophrenia, unspecified type (HCC)  F20.9 busPIRone (BUSPAR) 15 MG tablet    OLANZapine (ZYPREXA) 20 MG tablet  2. Benign prostatic hyperplasia, unspecified whether lower urinary tract symptoms present  N40.0 prazosin (MINIPRESS) 1 MG capsule    Past Psychiatric History: schizophrenia, schizoaffective disorder, bipolar disorder, tobacco use, and PTSD  Past Medical  History:  Past Medical History:  Diagnosis Date  . Acute urinary retention   . Anxiety   . Brain injury (HCC) 1975  . Complication of anesthesia 08/29/13   difficulty due to large tongue, anterior larynx and limited opening  . Difficult intubation 08/31/13  . 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 Manic depression (HCC)   . Pelvic fracture (HCC) 09/21/13   Inferior Pubic Ramus, Left Superior Ramus  . Schizo affective schizophrenia (HCC)   . Schizophrenia [facility])     Past Surgical History:  Procedure Laterality Date  . COSMETIC SURGERY Left 1975   created new ear lobe  . DRESSING CHANGE UNDER ANESTHESIA Left 08/31/2013   Procedure: DRESSING CHANGE UNDER ANESTHESIA for right ankle and left thigh;  Surgeon: Budd Palmer, MD;  Location: [facility] OR;  Service: Orthopedics;  Laterality: Left;  . I & D EXTREMITY Bilateral 08/30/2013   Procedure: IRRIGATION AND DEBRIDEMENT with closure Left Thigh wound, Irrigation and debridement Right Ankle ;  Surgeon: Harvie Junior, MD;  Location: [facility] OR;  Service: Orthopedics;  Laterality: Bilateral;  . I & D EXTREMITY Left 09/26/2013   Procedure: LEFT IRRIGATION AND DEBRIDEMENT Robby Sermon;  Surgeon: Budd Palmer, MD;  Location: [facility] OR;  Service: Orthopedics;  Laterality: Left;  . I & D EXTREMITY Left 10/02/2013   Procedure: IRRIGATION AND DEBRIDEMENT EXTREMITY;  Surgeon: Budd Palmer, MD;  [facility] OR;  Service:  Orthopedics;  Laterality: Left;  . INCISION AND DRAINAGE OF WOUND Left 09/21/2013   Procedure: IRRIGATION AND DEBRIDEMENT SOFT TISSUE WOUND WITH LARGE WOUND VAC PLACEMENT;  Surgeon: Budd PalmerMichael H Handy, MD;  Location: [facility] OR;  Service: Orthopedics;  Laterality: Left;  Marland Kitchen. MANDIBLE FRACTURE SURGERY  1975  . SACRO-ILIAC PINNING Left 08/31/2013   Procedure: LEFT SACRO-ILIAC PINNING;  Surgeon: Budd PalmerMichael H Handy, MD;  [facility];  Service: Orthopedics;  Laterality: Left;  . SKIN GRAFT    . SKIN GRAFT Left 10/31/2013   left thigh      DR HANDY    . SKIN SPLIT GRAFT Left 10/02/2013   Procedure: SKIN GRAFT SPLIT THICKNESS;  Surgeon: Budd PalmerMichael H Handy, MD;  [facility];  Service: Orthopedics;  Laterality: Left;  . SKIN SPLIT GRAFT Left 10/31/2013   Procedure: LEFT THIGH SKIN GRAFT SPLIT THICKNESS;  Surgeon: Budd PalmerMichael H Handy, MD;  Location: [facility] OR;  Service: Orthopedics;  Laterality: Left;    Family Psychiatric History: Denies  Family History:  Family History  Problem Relation Age of Onset  . Diabetes Mother   . Obsessive Compulsive Disorder Father   . Bone cancer Father     Social History:  Social History   Socioeconomic History  . Marital status: Single    Spouse name: Not on file  . Number of children: Not on file  . Years of education: Not on file  . Highest education level: Not on file  Occupational History  . Not on file  Tobacco Use  . Smoking status: Current Every Day Smoker    Packs/day: 1.50    Types: Cigarettes    Last attempt to quit: 08/29/2013    Years since quitting: 7.2  . Smokeless tobacco: Never Used  Vaping Use  . Vaping Use: Never used  Substance and Sexual Activity  . Alcohol use: No  . Drug use: No  . Sexual activity: Never    Comment: Quit smolikg 08/29/2013  Other Topics Concern  . Not on file  Social History Narrative  . Not on file   Social Determinants of Health   Financial Resource Strain: Not on file  Food Insecurity: Not on file  Transportation Needs: Not on file  Physical Activity: Not on file  Stress: Not on file  Social Connections: Not on file    Allergies:  Allergies  Allergen Reactions  . Penicillins Other (See Comments)    Unsure of reaction, had allergic reaction as a child and has not taken since then    Metabolic Disorder Labs: Lab Results  Component Value Date   HGBA1C 5.5 10/23/2020   No results found for: PROLACTIN Lab Results  Component Value Date   CHOL 274 (H) 10/23/2020   TRIG 447 (H) 10/23/2020   HDL 39 (L) 10/23/2020   CHOLHDL 7.0 (H)  10/23/2020   LDLCALC 151 (H) 10/23/2020   Lab Results  Component Value Date   TSH 1.250 10/23/2020    Therapeutic Level Labs: No results found for: LITHIUM No results found for: VALPROATE No components found for:  CBMZ  Current Medications: Current Outpatient Medications  Medication Sig Dispense Refill  . busPIRone (BUSPAR) 15 MG tablet Take 1 tablet (15 mg total) by mouth daily. 90 tablet 2  . famotidine (PEPCID) 20 MG tablet Take 1 tablet (20 mg total) by mouth 2 (two) times daily. To reduce stomach acid 180 tablet 1  . folic acid (FOLVITE) 1 MG tablet Take 1 mg by mouth daily.    .Marland Kitchen  ibuprofen (ADVIL) 600 MG tablet Take 1 tablet (600 mg total) by mouth every 8 (eight) hours as needed for moderate pain. Take after eating 90 tablet 0  . lisinopril (ZESTRIL) 5 MG tablet Take 1 tablet (5 mg total) by mouth daily. To lower blood pressure 90 tablet 1  . OLANZapine (ZYPREXA) 20 MG tablet Take 1 tablet (20 mg total) by mouth at bedtime. 90 tablet 2  . pravastatin (PRAVACHOL) 80 MG tablet Take 1 tablet (80 mg total) by mouth at bedtime. 90 tablet 1  . prazosin (MINIPRESS) 1 MG capsule Take 1 capsule (1 mg total) by mouth at bedtime. 90 capsule 2  . senna-docusate (SENOKOT-S) 8.6-50 MG tablet Take 1 tablet by mouth daily.     No current facility-administered medications for this visit.     Musculoskeletal: Strength & Muscle Tone: within normal limits Gait & Station: normal Patient leans: N/A  Psychiatric Specialty Exam: Review of Systems  Blood pressure 131/90, pulse (!) 107, height 6\' 2"  (1.88 m), weight 204 lb (92.5 kg), SpO2 98 %.Body mass index is 26.19 kg/m.  General Appearance: Fairly Groomed  Eye Contact:  Good  Speech:  Clear and Coherent and Normal Rate  Volume:  Normal  Mood:  Euthymic  Affect:  Appropriate and Congruent  Thought Process:  Coherent, Goal Directed and Linear  Orientation:  Full (Time, Place, and Person)  Thought Content: WDL and Logical   Suicidal  Thoughts:  No  Homicidal Thoughts:  No  Memory:  Immediate;   Good Recent;   Good Remote;   Good  Judgement:  Good  Insight:  Good  Psychomotor Activity:  Normal  Concentration:  Concentration: Good and Attention Span: Good  Recall:  Good  Fund of Knowledge: Good  Language: Good  Akathisia:  No  Handed:  Right  AIMS (if indicated): Not done  Assets:  Communication Skills Desire for Improvement Financial Resources/Insurance Housing Leisure Time  ADL's:  Intact  Cognition: WNL  Sleep:  Good   Screenings: GAD-7   Flowsheet Row Clinical Support from 12/12/2020 in [facility] Clinical Support from 09/17/2020 in [facility] Office Visit from 04/19/2020 in [facility]  Total GAD-7 Score 3 8 14     PHQ2-9   Flowsheet Row Clinical Support from 12/12/2020 in [facility] Clinical Support from 09/17/2020 in [facility] Office Visit from 05/29/2020 in [facility] Office Visit from 04/19/2020 in [facility]  PHQ-2 Total Score 0 0 0 2  PHQ-9 Total Score 3 7 -- 12    Flowsheet Row Clinical Support from 12/12/2020 in [facility] Clinical Support from 09/17/2020 in [facility]  C-SSRS RISK CATEGORY No Risk No Risk       Assessment and Plan: Patient notes that he is doing well on his current medication regimen.  No medication changes made today.  Patient agreeable to continue medication as prescribed.  1. Schizophrenia, unspecified type (HCC)  Continue- busPIRone (BUSPAR) 15 MG tablet; Take 1 tablet (15 mg total) by mouth daily.  Dispense: 90 tablet; Refill: 2 Continue- OLANZapine (ZYPREXA) 20 MG tablet; Take 1 tablet (20 mg total) by mouth at bedtime.  Dispense: 90 tablet; Refill: 2  2. Benign prostatic hyperplasia, unspecified whether  lower urinary tract symptoms present  Continue- prazosin (MINIPRESS) 1 MG capsule; Take 1 capsule (1 mg total) by mouth at  bedtime.  Dispense: 90 capsule; Refill: 2  Follow-up in 3 months  Shanna Cisco, NP 12/12/2020, 11:31 AM

## 2020-12-31 ENCOUNTER — Ambulatory Visit: Payer: Medicare Other | Attending: Nurse Practitioner | Admitting: Nurse Practitioner

## 2020-12-31 ENCOUNTER — Encounter: Payer: Self-pay | Admitting: Nurse Practitioner

## 2020-12-31 ENCOUNTER — Other Ambulatory Visit: Payer: Self-pay

## 2020-12-31 VITALS — BP 123/78 | HR 115 | Resp 16 | Ht 74.5 in | Wt 204.0 lb

## 2020-12-31 DIAGNOSIS — Z0000 Encounter for general adult medical examination without abnormal findings: Secondary | ICD-10-CM

## 2020-12-31 DIAGNOSIS — M25429 Effusion, unspecified elbow: Secondary | ICD-10-CM | POA: Diagnosis not present

## 2020-12-31 DIAGNOSIS — I10 Essential (primary) hypertension: Secondary | ICD-10-CM | POA: Insufficient documentation

## 2020-12-31 DIAGNOSIS — Z87820 Personal history of traumatic brain injury: Secondary | ICD-10-CM | POA: Diagnosis not present

## 2020-12-31 DIAGNOSIS — Y939 Activity, unspecified: Secondary | ICD-10-CM | POA: Diagnosis not present

## 2020-12-31 DIAGNOSIS — R9431 Abnormal electrocardiogram [ECG] [EKG]: Secondary | ICD-10-CM | POA: Diagnosis not present

## 2020-12-31 DIAGNOSIS — F319 Bipolar disorder, unspecified: Secondary | ICD-10-CM | POA: Diagnosis not present

## 2020-12-31 DIAGNOSIS — Z7901 Long term (current) use of anticoagulants: Secondary | ICD-10-CM | POA: Diagnosis not present

## 2020-12-31 DIAGNOSIS — H6192 Disorder of left external ear, unspecified: Secondary | ICD-10-CM | POA: Insufficient documentation

## 2020-12-31 DIAGNOSIS — M25512 Pain in left shoulder: Secondary | ICD-10-CM | POA: Diagnosis not present

## 2020-12-31 DIAGNOSIS — Z0001 Encounter for general adult medical examination with abnormal findings: Secondary | ICD-10-CM | POA: Diagnosis not present

## 2020-12-31 DIAGNOSIS — Z79899 Other long term (current) drug therapy: Secondary | ICD-10-CM | POA: Diagnosis not present

## 2020-12-31 DIAGNOSIS — W010XXA Fall on same level from slipping, tripping and stumbling without subsequent striking against object, initial encounter: Secondary | ICD-10-CM | POA: Insufficient documentation

## 2020-12-31 DIAGNOSIS — Z1211 Encounter for screening for malignant neoplasm of colon: Secondary | ICD-10-CM | POA: Insufficient documentation

## 2020-12-31 DIAGNOSIS — F419 Anxiety disorder, unspecified: Secondary | ICD-10-CM | POA: Diagnosis not present

## 2020-12-31 DIAGNOSIS — R0781 Pleurodynia: Secondary | ICD-10-CM | POA: Diagnosis not present

## 2020-12-31 DIAGNOSIS — Y929 Unspecified place or not applicable: Secondary | ICD-10-CM | POA: Insufficient documentation

## 2020-12-31 DIAGNOSIS — L989 Disorder of the skin and subcutaneous tissue, unspecified: Secondary | ICD-10-CM | POA: Insufficient documentation

## 2020-12-31 DIAGNOSIS — G8929 Other chronic pain: Secondary | ICD-10-CM | POA: Diagnosis not present

## 2020-12-31 NOTE — Progress Notes (Addendum)
Assessment & Plan:  Leshawn was seen today for annual exam.  Diagnoses and all orders for this visit:  Encounter for annual physical exam  Skin lesion of left ear -     Ambulatory referral to Dermatology  Abnormal EKG -     Ambulatory referral to Cardiology  Colon cancer screening -     Ambulatory referral to Gastroenterology  Essential hypertension -     CMP14+EGFR Continue all antihypertensives as prescribed.  Remember to bring in your blood pressure log with you for your follow up appointment.  DASH/Mediterranean Diets are healthier choices for HTN.    Chronic left shoulder pain -     Ambulatory referral to Orthopedic Surgery    Patient has been counseled on age-appropriate routine health concerns for screening and prevention. These are reviewed and up-to-date. Referrals have been placed accordingly. Immunizations are up-to-date or declined.    Subjective:   Chief Complaint  Patient presents with  . Annual Exam   HPI BRYSON GAVIA 70 y.o. male presents to office today for physical exam He has a past medical history Anxiety, Brain injury (1975), Head injury (1975), Manic depression ([place]), Pelvic fracture ([place]) (09/21/13), Schizo affective schizophrenia ([place])  Left elbow swollen, Cant raise his left arm above his shoulder which he states is chronic from a previous accident.   He sustained a fall a few days ago. States he tripped over his feet. Denies any loss of consciousness. Notes left sided rib cage pain and there is palpable pain present however no obvious deformity or bruising noted on exam.   Review of Systems  Constitutional: Negative for fever, malaise/fatigue and weight loss.  HENT: Negative.  Negative for nosebleeds.   Eyes: Negative.  Negative for blurred vision, double vision and photophobia.  Respiratory: Negative.  Negative for cough and shortness of breath.   Cardiovascular: Negative.  Negative for chest pain, palpitations and leg swelling.   Gastrointestinal: Negative.  Negative for heartburn, nausea and vomiting.  Genitourinary: Negative.   Musculoskeletal: Positive for joint pain (left shoulder) and myalgias.  Skin: Negative.   Neurological: Negative.  Negative for dizziness, focal weakness, seizures and headaches.  Endo/Heme/Allergies: Negative.   Psychiatric/Behavioral: Positive for hallucinations. Negative for depression, memory loss and suicidal ideas.    Past Medical History:  Diagnosis Date  . Acute urinary retention   . Anxiety   . Brain injury ([place]) 1975  . Complication of anesthesia 08/29/13   difficulty due to large tongue, anterior larynx and limited opening  . Difficult intubation 08/31/13  . 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 Manic depression ([place])   . Pelvic fracture ([place]) 09/21/13   Inferior Pubic Ramus, Left Superior Ramus  . Schizo affective schizophrenia ([place])   . Schizophrenia [facility])     Past Surgical History:  Procedure Laterality Date  . COSMETIC SURGERY Left 1975   created new ear lobe  . DRESSING CHANGE UNDER ANESTHESIA Left 08/31/2013   Procedure: DRESSING CHANGE UNDER ANESTHESIA for right ankle and left thigh;  Surgeon: Rozanna Box, MD;  Location: [place];  Service: Orthopedics;  Laterality: Left;  . I & D EXTREMITY Bilateral 08/30/2013   Procedure: IRRIGATION AND DEBRIDEMENT with closure Left Thigh wound, Irrigation and debridement Right Ankle ;  Surgeon: Alta Corning, MD;  Location: [place];  Service: Orthopedics;  Laterality: Bilateral;  . I & D EXTREMITY Left 09/26/2013   Procedure: LEFT IRRIGATION AND DEBRIDEMENT Retta Diones;  Surgeon: Rozanna Box, MD;  Location: [place];  Service: Orthopedics;  Laterality: Left;  . I & D EXTREMITY Left 10/02/2013   Procedure: IRRIGATION AND DEBRIDEMENT EXTREMITY;  Surgeon: Rozanna Box, MD;  Location: [place];  Service: Orthopedics;  Laterality: Left;  . INCISION AND DRAINAGE OF WOUND Left 09/21/2013   Procedure: IRRIGATION AND  DEBRIDEMENT SOFT TISSUE WOUND WITH LARGE WOUND VAC PLACEMENT;  Surgeon: Rozanna Box, MD;  Location: [place];  Service: Orthopedics;  Laterality: Left;  Marland Kitchen [place]  . SACRO-ILIAC PINNING Left 08/31/2013   Procedure: LEFT SACRO-ILIAC PINNING;  Surgeon: Rozanna Box, MD;  Location: [place];  Service: Orthopedics;  Laterality: Left;  . SKIN GRAFT    . SKIN GRAFT Left 10/31/2013   left thigh      DR HANDY   . SKIN SPLIT GRAFT Left 10/02/2013   Procedure: SKIN GRAFT SPLIT THICKNESS;  Surgeon: Rozanna Box, MD;  Location: [place];  Service: Orthopedics;  Laterality: Left;  . SKIN SPLIT GRAFT Left 10/31/2013   Procedure: LEFT THIGH SKIN GRAFT SPLIT THICKNESS;  Surgeon: Rozanna Box, MD;  Location: [place];  Service: Orthopedics;  Laterality: Left;    Family History  Problem Relation Age of Onset  . Diabetes Mother   . Obsessive Compulsive Disorder Father   . Bone cancer Father     Social History Reviewed with no changes to be made today.   Outpatient Medications Prior to Visit  Medication Sig Dispense Refill  . busPIRone (BUSPAR) 15 MG tablet Take 1 tablet (15 mg total) by mouth daily. 90 tablet 2  . famotidine (PEPCID) 20 MG tablet Take 1 tablet (20 mg total) by mouth 2 (two) times daily. To reduce stomach acid 408 tablet 1  . folic acid (FOLVITE) 1 MG tablet Take 1 mg by mouth daily.    Marland Kitchen ibuprofen (ADVIL) 600 MG tablet Take 1 tablet (600 mg total) by mouth every 8 (eight) hours as needed for moderate pain. Take after eating 90 tablet 0  . lisinopril (ZESTRIL) 5 MG tablet Take 1 tablet (5 mg total) by mouth daily. To lower blood pressure 90 tablet 1  . OLANZapine (ZYPREXA) 20 MG tablet Take 1 tablet (20 mg total) by mouth at bedtime. 90 tablet 2  . pravastatin (PRAVACHOL) 80 MG tablet Take 1 tablet (80 mg total) by mouth at bedtime. 90 tablet 1  . prazosin (MINIPRESS) 1 MG capsule Take 1 capsule (1 mg total) by mouth at bedtime. 90 capsule 2  . senna-docusate  (SENOKOT-S) 8.6-50 MG tablet Take 1 tablet by mouth daily.     No facility-administered medications prior to visit.    Allergies  Allergen Reactions  . Penicillins Other (See Comments)    Unsure of reaction, had allergic reaction as a child and has not taken since then       Objective:    BP 123/78   Pulse (!) 115   Resp 16   Ht 6' 2.5" (1.892 m)   Wt 204 lb (92.5 kg)   SpO2 95%   BMI 25.84 kg/m  Wt Readings from Last 3 Encounters:  12/31/20 204 lb (92.5 kg)  05/29/20 192 lb 3.2 oz (87.2 kg)  04/19/20 183 lb 6.4 oz (83.2 kg)    Physical Exam Constitutional:      Appearance: He is overweight.     Comments: Disheveled with poor hygiene  HENT:     Head: Normocephalic and atraumatic.     Comments: Hair is matted  Right Ear: Hearing, tympanic membrane, ear canal and external ear normal.     Left Ear: Hearing, tympanic membrane, ear canal and external ear normal.     Ears:      Nose: Nose normal. No mucosal edema or rhinorrhea.     Mouth/Throat:     Dentition: Abnormal dentition. Dental caries present.     Tongue: No lesions. Tongue does not deviate from midline.     Pharynx: Uvula midline.     Tonsils: No tonsillar exudate. 1+ on the right. 1+ on the left.  Eyes:     General: Lids are normal. No scleral icterus.    Conjunctiva/sclera: Conjunctivae normal.     Pupils: Pupils are equal, round, and reactive to light.  Neck:     Thyroid: No thyromegaly.     Trachea: No tracheal deviation.  Cardiovascular:     Rate and Rhythm: Regular rhythm. Tachycardia present.     Heart sounds: Normal heart sounds. No murmur heard. No friction rub. No gallop.   Pulmonary:     Effort: Pulmonary effort is normal. No respiratory distress.     Breath sounds: Normal breath sounds. No wheezing or rales.  Chest:     Chest wall: Tenderness present. No mass.  Breasts:     Right: No inverted nipple, mass, nipple discharge, skin change or tenderness.     Left: No inverted nipple, mass,  nipple discharge, skin change or tenderness.     Abdominal:     General: Bowel sounds are normal. There is no distension.     Palpations: Abdomen is soft. There is no mass.     Tenderness: There is no abdominal tenderness. There is no guarding or rebound.     Hernia: There is no hernia in the left inguinal area.  Genitourinary:    Testes:        Right: Mass, tenderness or swelling not present. Right testis is descended. Cremasteric reflex is present.         Left: Mass, tenderness or swelling not present. Left testis is descended. Cremasteric reflex is present.   Musculoskeletal:        General: Swelling, tenderness and deformity present.     Left shoulder: Decreased range of motion.     Left elbow: Swelling present.     Right hand: Swelling (with bruising present on palmar side due to fall a few days ago.) present.     Cervical back: Normal range of motion and neck supple.     Left lower leg: Deformity (previous traumatic injury ) present.  Lymphadenopathy:     Cervical: No cervical adenopathy.     Lower Body: No right inguinal adenopathy. No left inguinal adenopathy.  Skin:    General: Skin is warm and dry.     Capillary Refill: Capillary refill takes less than 2 seconds.     Findings: Bruising (generalized) present. No erythema.  Neurological:     Mental Status: He is alert and oriented to person, place, and time.     Cranial Nerves: No cranial nerve deficit.     Motor: No abnormal muscle tone.     Coordination: Coordination is intact. Coordination normal.     Deep Tendon Reflexes: Reflexes normal.     Reflex Scores:      Patellar reflexes are 1+ on the right side and 1+ on the left side. Psychiatric:        Behavior: Behavior normal. Behavior is cooperative.        Thought  Content: Thought content normal.        Judgment: Judgment normal.          Patient has been counseled extensively about nutrition and exercise as well as the importance of adherence with  medications and regular follow-up. The patient was given clear instructions to go to ER or return to medical center if symptoms don't improve, worsen or new problems develop. The patient verbalized understanding.   Follow-up: Return in about 3 months (around 04/02/2021).   Gildardo Pounds, FNP-BC [facility] and [place] [place], [place]   12/31/2020, 10:33 PM

## 2021-01-01 LAB — CMP14+EGFR
ALT: 23 IU/L (ref 0–44)
AST: 21 IU/L (ref 0–40)
Albumin/Globulin Ratio: 1.5 (ref 1.2–2.2)
Albumin: 4.4 g/dL (ref 3.8–4.8)
Alkaline Phosphatase: 131 IU/L — ABNORMAL HIGH (ref 44–121)
BUN/Creatinine Ratio: 14 (ref 10–24)
BUN: 15 mg/dL (ref 8–27)
Bilirubin Total: 0.9 mg/dL (ref 0.0–1.2)
CO2: 23 mmol/L (ref 20–29)
Calcium: 9.8 mg/dL (ref 8.6–10.2)
Chloride: 101 mmol/L (ref 96–106)
Creatinine, Ser: 1.05 mg/dL (ref 0.76–1.27)
Globulin, Total: 2.9 g/dL (ref 1.5–4.5)
Glucose: 121 mg/dL — ABNORMAL HIGH (ref 65–99)
Potassium: 4.5 mmol/L (ref 3.5–5.2)
Sodium: 141 mmol/L (ref 134–144)
Total Protein: 7.3 g/dL (ref 6.0–8.5)
eGFR: 77 mL/min/{1.73_m2} (ref >59)

## 2021-01-08 ENCOUNTER — Ambulatory Visit (INDEPENDENT_AMBULATORY_CARE_PROVIDER_SITE_OTHER): Payer: Medicare Other

## 2021-01-08 ENCOUNTER — Ambulatory Visit (INDEPENDENT_AMBULATORY_CARE_PROVIDER_SITE_OTHER): Payer: Medicare Other | Admitting: Orthopaedic Surgery

## 2021-01-08 ENCOUNTER — Other Ambulatory Visit: Payer: Self-pay

## 2021-01-08 DIAGNOSIS — M25512 Pain in left shoulder: Secondary | ICD-10-CM

## 2021-01-08 DIAGNOSIS — G8929 Other chronic pain: Secondary | ICD-10-CM
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
## 2021-01-20 ENCOUNTER — Ambulatory Visit: Payer: Medicare Other | Admitting: Orthopedic Surgery

## 2021-01-28 ENCOUNTER — Telehealth (HOSPITAL_COMMUNITY): Payer: Self-pay | Admitting: *Deleted

## 2021-01-28 NOTE — Telephone Encounter (Signed)
Rx Refill Request   pravastatin (PRAVACHOL) 80 MG tablet

## 2021-01-28 NOTE — Telephone Encounter (Signed)
Hello this medication treats cholesterol.  Patient will have to get this refilled from his PCP.

## 2021-02-21 ENCOUNTER — Telehealth: Payer: Self-pay

## 2021-02-21 NOTE — Telephone Encounter (Signed)
Done

## 2021-02-28 ENCOUNTER — Ambulatory Visit: Payer: Medicare Other

## 2021-03-13 ENCOUNTER — Other Ambulatory Visit: Payer: Self-pay

## 2021-03-13 ENCOUNTER — Encounter (HOSPITAL_COMMUNITY): Payer: Self-pay | Admitting: Psychiatry

## 2021-03-13 ENCOUNTER — Ambulatory Visit (INDEPENDENT_AMBULATORY_CARE_PROVIDER_SITE_OTHER): Payer: Medicare Other | Admitting: Psychiatry

## 2021-03-13 DIAGNOSIS — N400 Enlarged prostate without lower urinary tract symptoms: Secondary | ICD-10-CM | POA: Diagnosis not present

## 2021-03-13 DIAGNOSIS — F209 Schizophrenia, unspecified: Secondary | ICD-10-CM | POA: Diagnosis not present

## 2021-03-13 MED ORDER — OLANZAPINE 20 MG PO TABS: 20.0000 mg | ORAL_TABLET | Freq: Every day | ORAL | 3 refills | Status: DC

## 2021-03-13 MED ORDER — PRAZOSIN HCL 1 MG PO CAPS: 1.0000 mg | ORAL_CAPSULE | Freq: Every day | ORAL | 3 refills | Status: DC

## 2021-03-13 MED ORDER — BUSPIRONE HCL 15 MG PO TABS: 15.0000 mg | ORAL_TABLET | Freq: Three times a day (TID) | ORAL | 3 refills | Status: DC
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
## 2021-03-17 ENCOUNTER — Ambulatory Visit: Payer: Medicare Other | Admitting: Internal Medicine
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
## 2021-03-19 ENCOUNTER — Ambulatory Visit (INDEPENDENT_AMBULATORY_CARE_PROVIDER_SITE_OTHER): Payer: Medicare Other | Admitting: Internal Medicine

## 2021-03-19 ENCOUNTER — Encounter: Payer: Self-pay | Admitting: Internal Medicine

## 2021-03-19 ENCOUNTER — Ambulatory Visit (INDEPENDENT_AMBULATORY_CARE_PROVIDER_SITE_OTHER): Payer: Medicare Other

## 2021-03-19 ENCOUNTER — Other Ambulatory Visit: Payer: Self-pay

## 2021-03-19 VITALS — BP 130/83 | HR 110 | Ht 74.0 in | Wt 204.2 lb

## 2021-03-19 DIAGNOSIS — R002 Palpitations: Secondary | ICD-10-CM | POA: Diagnosis not present

## 2021-03-19 DIAGNOSIS — I700 Atherosclerosis of aorta: Secondary | ICD-10-CM | POA: Diagnosis not present

## 2021-03-19 DIAGNOSIS — Z720 Tobacco use: Secondary | ICD-10-CM

## 2021-03-19 DIAGNOSIS — E782 Mixed hyperlipidemia: Secondary | ICD-10-CM | POA: Diagnosis not present

## 2021-03-19 MED ORDER — ROSUVASTATIN CALCIUM 5 MG PO TABS
5.0000 mg | ORAL_TABLET | Freq: Every day | ORAL | 3 refills | Status: DC
Start: 2021-03-19 — End: 2021-06-18

## 2021-03-19 NOTE — Patient Instructions (Signed)
Medication Instructions:  Your physician has recommended you make the following change in your medication:  STOP: pravastatin   START:  rosuvastatin (Crestor) 5 mg by mouth daily *If you need a refill on your cardiac medications before your next appointment, please call your pharmacy*   Lab Work: IN 3 MONTHS: FLP, LFFT If you have labs (blood work) drawn today and your tests are completely normal, you will receive your results only by: MyChart Message (if you have MyChart) OR A paper copy in the mail If you have any lab test that is abnormal or we need to change your treatment, we will call you to review the results.   Testing/Procedures: Your physician has requested that you have an echocardiogram. Echocardiography is a painless test that uses sound waves to create images of your heart. It provides your doctor with information about the size and shape of your heart and how well your heart's chambers and valves are working. This procedure takes approximately one hour. There are no restrictions for this procedure.  Your physician has requested that you wear a heart monitor.     Follow-Up: At [facility], you and your health needs are our priority.  As part of our continuing mission to provide you with exceptional heart care, we have created designated Provider Care Teams.  These Care Teams include your primary Cardiologist (physician) and Advanced Practice Providers (APPs -  Physician Assistants and Nurse Practitioners) who all work together to provide you with the care you need, when you need it.  We recommend signing up for the patient portal called "MyChart".  Sign up information is provided on this After Visit Summary.  MyChart is used to connect with patients for Virtual Visits (Telemedicine).  Patients are able to view lab/test results, encounter notes, upcoming appointments, etc.  Non-urgent messages can be sent to your provider as well.   To learn more about what you can do with  MyChart, go to [email or web address].    Your next appointment:   6 month(s)  The format for your next appointment:   In Person  Provider:   You may see Christell Constant, MD or one of the following Advanced Practice Providers on your designated Care Team:   Ronie Spies, PA-C Jacolyn Reedy, PA-C   Other Instructions Christena Deem- Long Term Monitor Instructions  Your physician has requested you wear a ZIO patch monitor for 7 days.  This is a single patch monitor. [employer] supplies one patch monitor per enrollment. Additional stickers are not available. Please do not apply patch if you will be having a Nuclear Stress Test,  Echocardiogram, Cardiac CT, MRI, or Chest Xray during the period you would be wearing the  monitor. The patch cannot be worn during these tests. You cannot remove and re-apply the  ZIO XT patch monitor.  Your ZIO patch monitor will be mailed 3 day USPS to your address on file. It may take 3-5 days  to receive your monitor after you have been enrolled.  Once you have received your monitor, please review the enclosed instructions. Your monitor  has already been registered assigning a specific monitor serial # to you.  Billing and Patient Assistance Program Information  We have supplied [employer] with any of your insurance information on file for billing purposes. [employer] offers a sliding scale Patient Assistance Program for patients that do not have  insurance, or whose insurance does not completely cover the cost of the ZIO monitor.  You must apply for the Patient  Assistance Program to qualify for this discounted rate.  To apply, please call [employer] at [phone], select option 4, select option 2, ask to apply for  Patient Assistance Program. Meredeth Ide will ask your household income, and how many people  are in your household. They will quote your out-of-pocket cost based on that information.  [employer] will also be able to set up a 31-month, interest-free payment  plan if needed.  Applying the monitor   Shave hair from upper left chest.  Hold abrader disc by orange tab. Rub abrader in 40 strokes over the upper left chest as  indicated in your monitor instructions.  Clean area with 4 enclosed alcohol pads. Let dry.  Apply patch as indicated in monitor instructions. Patch will be placed under collarbone on left  side of chest with arrow pointing upward.  Rub patch adhesive wings for 2 minutes. Remove white label marked "1". Remove the white  label marked "2". Rub patch adhesive wings for 2 additional minutes.  While looking in a mirror, press and release button in center of patch. A small green light will  flash 3-4 times. This will be your only indicator that the monitor has been turned on.  Do not shower for the first 24 hours. You may shower after the first 24 hours.  Press the button if you feel a symptom. You will hear a small click. Record Date, Time and  Symptom in the Patient Logbook.  When you are ready to remove the patch, follow instructions on the last 2 pages of Patient  Logbook. Stick patch monitor onto the last page of Patient Logbook.  Place Patient Logbook in the blue and white box. Use locking tab on box and tape box closed  securely. The blue and white box has prepaid postage on it. Please place it in the mailbox as  soon as possible. Your physician should have your test results approximately 7 days after the  monitor has been mailed back to [facility].  Call [facility] Customer Care at [phone] if you have questions regarding  your ZIO XT patch monitor. Call them immediately if you see an orange light blinking on your  monitor.  If your monitor falls off in less than 4 days, contact our Monitor department at [phone].  If your monitor becomes loose or falls off after 4 days call [employer] at [phone] for  suggestions on securing your monitor

## 2021-03-19 NOTE — Progress Notes (Unsigned)
Patient enrolled for [employer] to mail a 7 day ZIO XT monitor to his address on file. 

## 2021-04-01 ENCOUNTER — Other Ambulatory Visit: Payer: Self-pay

## 2021-04-01 ENCOUNTER — Ambulatory Visit: Payer: Medicare Other | Attending: Nurse Practitioner | Admitting: Nurse Practitioner

## 2021-04-01 ENCOUNTER — Encounter: Payer: Self-pay | Admitting: Nurse Practitioner

## 2021-04-01 VITALS — BP 134/83 | HR 104 | Ht 74.0 in | Wt 204.2 lb

## 2021-04-01 DIAGNOSIS — F259 Schizoaffective disorder, unspecified: Secondary | ICD-10-CM | POA: Diagnosis not present

## 2021-04-01 DIAGNOSIS — I10 Essential (primary) hypertension: Secondary | ICD-10-CM

## 2021-04-01 DIAGNOSIS — Z79899 Other long term (current) drug therapy: Secondary | ICD-10-CM | POA: Diagnosis not present

## 2021-04-01 DIAGNOSIS — Z880 Allergy status to penicillin: Secondary | ICD-10-CM | POA: Diagnosis not present

## 2021-04-01 DIAGNOSIS — Z1159 Encounter for screening for other viral diseases: Secondary | ICD-10-CM

## 2021-04-01 DIAGNOSIS — R000 Tachycardia, unspecified: Secondary | ICD-10-CM | POA: Diagnosis not present

## 2021-04-01 DIAGNOSIS — E785 Hyperlipidemia, unspecified: Secondary | ICD-10-CM | POA: Diagnosis not present

## 2021-04-01 DIAGNOSIS — Z87820 Personal history of traumatic brain injury: Secondary | ICD-10-CM | POA: Diagnosis not present

## 2021-04-01 DIAGNOSIS — R972 Elevated prostate specific antigen [PSA]: Secondary | ICD-10-CM | POA: Diagnosis not present

## 2021-04-01 MED ORDER — CARVEDILOL 3.125 MG PO TABS: 3.1250 mg | ORAL_TABLET | Freq: Two times a day (BID) | ORAL | 3 refills | Status: DC

## 2021-04-01 MED ORDER — LISINOPRIL 5 MG PO TABS: 5.0000 mg | ORAL_TABLET | Freq: Every day | ORAL | 1 refills | Status: DC

## 2021-04-01 MED ORDER — FAMOTIDINE 20 MG PO TABS: 20.0000 mg | ORAL_TABLET | Freq: Two times a day (BID) | ORAL | 1 refills | Status: DC

## 2021-04-01 NOTE — Progress Notes (Addendum)
Assessment & Plan:  [name] was seen today for hypertension.  Diagnoses and all orders for this visit:  Essential hypertension -     Basic metabolic panel -     lisinopril (ZESTRIL) 5 MG tablet; Take 1 tablet (5 mg total) by mouth daily. To lower blood pressure Continue all antihypertensives as prescribed.  Remember to bring in your blood pressure log with you for your follow up appointment.  DASH/Mediterranean Diets are healthier choices for HTN.    Elevated PSA -     PSA, total and free  Need for hepatitis C screening test -     HCV Ab w Reflex to Quant PCR  Dyslipidemia, goal LDL below 100 -     Lipid panel INSTRUCTIONS: Work on a low fat, heart healthy diet and participate in regular aerobic exercise program by working out at least 150 minutes per week; 5 days a week-30 minutes per day. Avoid red meat/beef/steak,  fried foods. junk foods, sodas, sugary drinks, unhealthy snacking, alcohol and smoking.  Drink at least 80 oz of water per day and monitor your carbohydrate intake daily.    Encounter for long-term current use of medication -     famotidine (PEPCID) 20 MG tablet; Take 1 tablet (20 mg total) by mouth 2 (two) times daily. To reduce stomach acid INSTRUCTIONS: Avoid GERD Triggers: acidic, spicy or fried foods, caffeine, coffee, sodas,  alcohol and chocolate.     Patient has been counseled on age-appropriate routine health concerns for screening and prevention. These are reviewed and up-to-date. Referrals have been placed accordingly. Immunizations are up-to-date or declined.    Subjective:   Chief Complaint  Patient presents with   Hypertension   HPI [name] 70 y.o. male presents to office today for HTN. He has a past medical history of Acute urinary retention, Anxiety, Brain injury (1975), Complication of anesthesia (08/29/13), Difficult intubation (08/31/13), Head injury (1975), Manic depression, Pelvic fracture (09/21/13), Schizo affective  schizophrenia  Uses a [employer] for transportation.   HTN Taking lisinopril 5 mg daily as prescribed. Denies chest pain, shortness of breath, palpitations, lightheadedness, dizziness, headaches or BLE edema. Remains tachycardic. Will try low dose carvedilol 3.125 mg BID as BP also slightly elevated.  BP Readings from Last 3 Encounters:  04/01/21 134/83  03/19/21 130/83  12/31/20 123/78    Dyslipidemia He was switched form pravastatin to rosuvastatin 5 mg by Cardiology at his most recent visit.  Lab Results  Component Value Date   CHOL 274 (H) 10/23/2020   Lab Results  Component Value Date   HDL 39 (L) 10/23/2020   Lab Results  Component Value Date   LDLCALC 151 (H) 10/23/2020   Lab Results  Component Value Date   TRIG 447 (H) 10/23/2020   Lab Results  Component Value Date   CHOLHDL 7.0 (H) 10/23/2020    Review of Systems  Constitutional:  Negative for fever, malaise/fatigue and weight loss.  HENT: Negative.  Negative for nosebleeds.   Eyes: Negative.  Negative for blurred vision, double vision and photophobia.  Respiratory: Negative.  Negative for cough and shortness of breath.   Cardiovascular: Negative.  Negative for chest pain, palpitations and leg swelling.  Gastrointestinal: Negative.  Negative for heartburn, nausea and vomiting.  Musculoskeletal: Negative.  Negative for myalgias.  Neurological: Negative.  Negative for dizziness, focal weakness, seizures and headaches.  Psychiatric/Behavioral: Negative.  Negative for suicidal ideas.        Schizoaffective disorder   Past Medical History:  Diagnosis Date   Acute urinary retention    Anxiety    Brain injury (HCC) 1975   Complication of anesthesia 08/29/13   difficulty due to large tongue, anterior larynx and limited opening   Difficult intubation 08/31/13   Head injury 1975   "Brain Stem Contusion"   Manic depression (HCC)    Pelvic fracture (HCC) 09/21/13   Inferior Pubic Ramus, Left Superior Ramus   Schizo  affective schizophrenia (HCC)    Schizophrenia (HCC)     Past Surgical History:  Procedure Laterality Date   COSMETIC SURGERY Left 1975   created new ear lobe   DRESSING CHANGE UNDER ANESTHESIA Left 08/31/2013   Procedure: DRESSING CHANGE UNDER ANESTHESIA for right ankle and left thigh;  Surgeon: Budd Palmer, MD;  [facility];  Service: Orthopedics;  Laterality: Left;   I & D EXTREMITY Bilateral 08/30/2013   Procedure: IRRIGATION AND DEBRIDEMENT with closure Left Thigh wound, Irrigation and debridement Right Ankle ;  Surgeon: Harvie Junior, MD;  [facility] OR;  Service: Orthopedics;  Laterality: Bilateral;   I & D EXTREMITY Left 09/26/2013   Procedure: LEFT IRRIGATION AND DEBRIDEMENT Robby Sermon;  Surgeon: Budd Palmer, MD;  Location: [facility] OR;  Service: Orthopedics;  Laterality: Left;   I & D EXTREMITY Left 10/02/2013   Procedure: IRRIGATION AND DEBRIDEMENT EXTREMITY;  Surgeon: Budd Palmer, MD;  [facility];  Service: Orthopedics;  Laterality: Left;   INCISION AND DRAINAGE OF WOUND Left 09/21/2013   Procedure: IRRIGATION AND DEBRIDEMENT SOFT TISSUE WOUND WITH LARGE WOUND VAC PLACEMENT;  Surgeon: Budd Palmer, MD;  [facility];  Service: Orthopedics;  Laterality: Left;   MANDIBLE FRACTURE SURGERY  1975   SACRO-ILIAC PINNING Left 08/31/2013   Procedure: LEFT SACRO-ILIAC PINNING;  Surgeon: Budd Palmer, MD;  Location: [facility] OR;  Service: Orthopedics;  Laterality: Left;   SKIN GRAFT     SKIN GRAFT Left 10/31/2013   left thigh      DR HANDY    SKIN SPLIT GRAFT Left 10/02/2013   Procedure: SKIN GRAFT SPLIT THICKNESS;  Surgeon: Budd Palmer, MD;  Location: [facility] OR;  Service: Orthopedics;  Laterality: Left;   SKIN SPLIT GRAFT Left 10/31/2013   Procedure: LEFT THIGH SKIN GRAFT SPLIT THICKNESS;  Surgeon: Budd Palmer, MD;  [facility];  Service: Orthopedics;  Laterality: Left;    Family History  Problem Relation Age of Onset   Diabetes Mother    Obsessive  Compulsive Disorder Father    Bone cancer Father     Social History Reviewed with no changes to be made today.   Outpatient Medications Prior to Visit  Medication Sig Dispense Refill   busPIRone (BUSPAR) 15 MG tablet Take 1 tablet (15 mg total) by mouth 3 (three) times daily. 90 tablet 3   folic acid (FOLVITE) 1 MG tablet Take 1 mg by mouth daily.     OLANZapine (ZYPREXA) 20 MG tablet Take 1 tablet (20 mg total) by mouth at bedtime. 90 tablet 3   prazosin (MINIPRESS) 1 MG capsule Take 1 capsule (1 mg total) by mouth at bedtime. 90 capsule 3   rosuvastatin (CRESTOR) 5 MG tablet Take 1 tablet (5 mg total) by mouth daily. 90 tablet 3   senna-docusate (SENOKOT-S) 8.6-50 MG tablet Take 1 tablet by mouth daily.     famotidine (PEPCID) 20 MG tablet Take 1 tablet (20 mg total) by mouth 2 (two) times daily. To reduce stomach acid 180 tablet  1   ibuprofen (ADVIL) 600 MG tablet Take 1 tablet (600 mg total) by mouth every 8 (eight) hours as needed for moderate pain. Take after eating 90 tablet 0   lisinopril (ZESTRIL) 5 MG tablet Take 1 tablet (5 mg total) by mouth daily. To lower blood pressure 90 tablet 1   No facility-administered medications prior to visit.    Allergies  Allergen Reactions   Penicillins Other (See Comments)    Unsure of reaction, had allergic reaction as a child and has not taken since then       Objective:    BP 134/83   Pulse (!) 104   Ht 6\' 2"  (1.88 m)   Wt 204 lb 4 oz (92.6 kg)   SpO2 94%   BMI 26.22 kg/m  Wt Readings from Last 3 Encounters:  04/01/21 204 lb 4 oz (92.6 kg)  03/19/21 204 lb 3.2 oz (92.6 kg)  12/31/20 204 lb (92.5 kg)    Physical Exam Vitals and nursing note reviewed.  Constitutional:      Appearance: He is well-developed.  HENT:     Head: Normocephalic and atraumatic.  Cardiovascular:     Rate and Rhythm: Regular rhythm. Tachycardia present.     Heart sounds: Normal heart sounds. No murmur heard.   No friction rub. No gallop.   Pulmonary:     Effort: Pulmonary effort is normal. No tachypnea or respiratory distress.     Breath sounds: Normal breath sounds. No decreased breath sounds, wheezing, rhonchi or rales.  Chest:     Chest wall: No tenderness.  Abdominal:     General: Bowel sounds are normal.     Palpations: Abdomen is soft.  Musculoskeletal:        General: Normal range of motion.     Cervical back: Normal range of motion.  Skin:    General: Skin is warm and dry.  Neurological:     Mental Status: He is alert and oriented to person, place, and time.     Coordination: Coordination normal.  Psychiatric:        Behavior: Behavior normal. Behavior is cooperative.        Thought Content: Thought content normal.        Judgment: Judgment normal.         Patient has been counseled extensively about nutrition and exercise as well as the importance of adherence with medications and regular follow-up. The patient was given clear instructions to go to ER or return to medical center if symptoms don't improve, worsen or new problems develop. The patient verbalized understanding.   Follow-up: Return in about 3 months (around 07/02/2021).   07/04/2021, FNP-BC [facility] and [facility] [place], [place]   04/01/2021, 3:39 PM

## 2021-04-01 NOTE — Patient Instructions (Signed)
[facility] Gastroenterologist in [place], [place] Address: [address] [place], [place], [place] [address] Phone: [phone]

## 2021-04-02 LAB — LIPID PANEL
Chol/HDL Ratio: 3.6 ratio (ref 0.0–5.0)
Cholesterol, Total: 185 mg/dL (ref 100–199)
HDL: 51 mg/dL (ref >39)
LDL Chol Calc (NIH): 112 mg/dL — ABNORMAL HIGH (ref 0–99)
Triglycerides: 124 mg/dL (ref 0–149)
VLDL Cholesterol Cal: 22 mg/dL (ref 5–40)

## 2021-04-02 LAB — HCV AB W REFLEX TO QUANT PCR: HCV Ab: <0.1 s/co ratio (ref 0.0–0.9)

## 2021-04-02 LAB — BASIC METABOLIC PANEL
BUN/Creatinine Ratio: 14 (ref 10–24)
BUN: 11 mg/dL (ref 8–27)
CO2: 24 mmol/L (ref 20–29)
Calcium: 9.7 mg/dL (ref 8.6–10.2)
Chloride: 100 mmol/L (ref 96–106)
Creatinine, Ser: 0.78 mg/dL (ref 0.76–1.27)
Glucose: 82 mg/dL (ref 65–99)
Potassium: 4.4 mmol/L (ref 3.5–5.2)
Sodium: 139 mmol/L (ref 134–144)
eGFR: 97 mL/min/{1.73_m2} (ref >59)

## 2021-04-02 LAB — HCV INTERPRETATION

## 2021-04-02 LAB — PSA, TOTAL AND FREE
PSA, Free Pct: 17.8 %
PSA, Free: 1.05 ng/mL
Prostate Specific Ag, Serum: 5.9 ng/mL — ABNORMAL HIGH (ref 0.0–4.0)

## 2021-04-04 DIAGNOSIS — R002 Palpitations: Secondary | ICD-10-CM | POA: Diagnosis not present

## 2021-04-10 ENCOUNTER — Ambulatory Visit (HOSPITAL_COMMUNITY): Payer: Medicare Other

## 2021-04-16 NOTE — Progress Notes (Signed)
Called pt unable to reach, left VM to call back. 

## 2021-04-17 ENCOUNTER — Encounter: Payer: Self-pay | Admitting: *Deleted

## 2021-04-29 ENCOUNTER — Ambulatory Visit: Payer: Medicare Other | Attending: Nurse Practitioner | Admitting: Pharmacist

## 2021-04-29 ENCOUNTER — Other Ambulatory Visit: Payer: Self-pay

## 2021-04-29 VITALS — BP 134/82 | HR 106

## 2021-04-29 DIAGNOSIS — I10 Essential (primary) hypertension: Secondary | ICD-10-CM | POA: Diagnosis not present

## 2021-04-29 MED ORDER — CARVEDILOL 3.125 MG PO TABS
3.1250 mg | ORAL_TABLET | Freq: Two times a day (BID) | ORAL | 3 refills | Status: DC
  Filled 2021-04-29: qty 60, 30d supply, fill #0
  Filled 2021-05-29: qty 60, 30d supply, fill #1

## 2021-04-29 NOTE — Progress Notes (Signed)
   S:    PCP: Fleming  Patient arrives in good spirits. Presents to the clinic for hypertension evaluation, counseling, and management.  Patient was referred and last seen by Primary Care Provider on 04/01/21 where BP was 134/83 mmHg and HR was 104 bpm. Carvedilol 3.125 mg BID was started.   Today patient reports he has not yet picked up his carvedilol as he did not know he was supposed to start taking carvedilol.   Medication adherence denied. Patient has not yet starting taking carvedilol.  Current BP Medications include:  carvedilol 3.125 mg BID, lisinopril 5 mg daily, prazosin 1 mg  Antihypertensives tried in the past include: none  Dietary habits include: not addressed at this visit Exercise habits include: not addressed at this visit Family / Social history:  not addressed at this visit  ASCVD risk factors include: HTN, HLD, tobacco abuse, aortic atherosclerosis   O: Vitals:   04/29/21 1459  BP: 134/82  Pulse: (!) 106    Home BP readings: No home BP monitor.   Last 3 Office BP readings: BP Readings from Last 3 Encounters:  04/29/21 134/82  04/01/21 134/83  03/19/21 130/83   BMET    Component Value Date/Time   NA 139 04/01/2021 1551   K 4.4 04/01/2021 1551   CL 100 04/01/2021 1551   CO2 24 04/01/2021 1551   GLUCOSE 82 04/01/2021 1551   GLUCOSE 91 10/31/2013 0644   BUN 11 04/01/2021 1551   CREATININE 0.78 04/01/2021 1551   CALCIUM 9.7 04/01/2021 1551   GFRNONAA 87 04/19/2020 0941   GFRAA 101 04/19/2020 0941   Renal function: CrCl cannot be calculated (Patient's most recent lab result is older than the maximum 21 days allowed.).  Clinical ASCVD: Yes, Minimal descending aortic atherosclerosis confirmed on a CT chest on 08/29/2013 The 10-year ASCVD risk score (Arnett DK, et al., 2019) is: 24.7%   Values used to calculate the score:     Age: 52 years     Sex: Male     Is Non-Hispanic African American: No     Diabetic: No     Tobacco smoker: Yes     Systolic  Blood Pressure: 134 mmHg     Is BP treated: Yes     HDL Cholesterol: 51 mg/dL     Total Cholesterol: 185 mg/dL  A/P: Hypertension longstanding currently slightly above goal on current medications. BP Goal = < 130/80 mmHg. Medication adherence denied as patient was unaware he was supposed to start carvedilol and he has not picked up from his pharmacy.  -Continued carvedilol 3.125. Rx sent to [facility] so patient did not have to take separate cab to [facility] -Continued all other HTN medications -Counseled on lifestyle modifications for blood pressure control including reduced dietary sodium, increased exercise, adequate sleep.  Results reviewed and written information provided.   Total time in face-to-face counseling 10 minutes.   F/U Pharmacy Clinic Visit in 1 month.   Valeda Malm, Pharm.D. PGY-1 Pharmacy Resident 04/29/2021 3:04 PM

## 2021-05-08 ENCOUNTER — Ambulatory Visit (HOSPITAL_COMMUNITY): Payer: Medicare Other

## 2021-05-28 ENCOUNTER — Telehealth: Payer: Self-pay | Admitting: *Deleted

## 2021-05-28 ENCOUNTER — Encounter (HOSPITAL_COMMUNITY): Payer: Self-pay

## 2021-05-28 ENCOUNTER — Ambulatory Visit (HOSPITAL_COMMUNITY): Payer: Medicare Other | Attending: Cardiology

## 2021-05-28 ENCOUNTER — Other Ambulatory Visit: Payer: Self-pay

## 2021-05-28 DIAGNOSIS — I700 Atherosclerosis of aorta: Secondary | ICD-10-CM | POA: Insufficient documentation

## 2021-05-28 DIAGNOSIS — R002 Palpitations: Secondary | ICD-10-CM | POA: Insufficient documentation

## 2021-05-28 DIAGNOSIS — K7689 Other specified diseases of liver: Secondary | ICD-10-CM

## 2021-05-28 LAB — ECHOCARDIOGRAM COMPLETE
Area-P 1/2: 5.02 cm2
S' Lateral: 2.7 cm

## 2021-05-28 NOTE — Telephone Encounter (Addendum)
-----   Message from Christell Constant, MD sent at 05/28/2021  4:27 PM EDT ----- Results: Questionable liver cyst incidentally found LV function OK Plan: Will get Liver US for follow up; will CC PCP  Christell Constant, MD   Called pt and reviewed results and recommendations.  Aware order for liver US will be placed and that he will be called to schedule this.  Message to [facility].

## 2021-05-29 ENCOUNTER — Ambulatory Visit: Payer: Medicare Other | Attending: Nurse Practitioner | Admitting: Pharmacist

## 2021-05-29 ENCOUNTER — Other Ambulatory Visit: Payer: Self-pay

## 2021-05-29 ENCOUNTER — Encounter: Payer: Self-pay | Admitting: Pharmacist

## 2021-05-29 VITALS — BP 122/73 | HR 85

## 2021-05-29 DIAGNOSIS — Z23 Encounter for immunization: Secondary | ICD-10-CM | POA: Diagnosis not present

## 2021-05-29 DIAGNOSIS — I10 Essential (primary) hypertension: Secondary | ICD-10-CM | POA: Diagnosis not present

## 2021-05-29 NOTE — Progress Notes (Signed)
   S:    PCP: Fleming  Patient arrives in good spirits. Presents to the clinic for hypertension evaluation, counseling, and management. Patient was referred and last seen by Primary Care Provider on 04/01/21. We saw him on 04/29/2021 and BP was good.   Medication adherence reported.   Current BP Medications include:  carvedilol 3.125 mg BID, lisinopril 5 mg daily, prazosin 1 mg  Antihypertensives tried in the past include: none  Dietary habits include: not compliant with salt restriction. Eats mainly TV dinners and "cups of noodles" Exercise habits include: none Family / Social history:   -Fhx: DM -Tobacco: current 1.5 PPD smoker  -Alcohol: none reported   ASCVD risk factors include: HTN, HLD, tobacco abuse, aortic atherosclerosis   O: Vitals:   05/29/21 1506  BP: 122/73  Pulse: 85   Home BP readings: No home BP monitor.   Last 3 Office BP readings: BP Readings from Last 3 Encounters:  05/29/21 122/73  04/29/21 134/82  04/01/21 134/83   BMET    Component Value Date/Time   NA 139 04/01/2021 1551   K 4.4 04/01/2021 1551   CL 100 04/01/2021 1551   CO2 24 04/01/2021 1551   GLUCOSE 82 04/01/2021 1551   GLUCOSE 91 10/31/2013 0644   BUN 11 04/01/2021 1551   CREATININE 0.78 04/01/2021 1551   CALCIUM 9.7 04/01/2021 1551   GFRNONAA 87 04/19/2020 0941   GFRAA 101 04/19/2020 0941   Renal function: CrCl cannot be calculated (Patient's most recent lab result is older than the maximum 21 days allowed.).  Clinical ASCVD: Yes, Minimal descending aortic atherosclerosis confirmed on a CT chest on 08/29/2013 The 10-year ASCVD risk score (Arnett DK, et al., 2019) is: 21.3%   Values used to calculate the score:     Age: 70 years     Sex: Male     Is Non-Hispanic African American: No     Diabetic: No     Tobacco smoker: Yes     Systolic Blood Pressure: 122 mmHg     Is BP treated: Yes     HDL Cholesterol: 51 mg/dL     Total Cholesterol: 185 mg/dL  A/P: Hypertension  longstanding currently at goal on current medications. BP Goal = < 130/80 mmHg. Medication adherence reported. -Continued current regimen. -Counseled on lifestyle modifications for blood pressure control including reduced dietary sodium, increased exercise, adequate sleep.  Results reviewed and written information provided.   Total time in face-to-face counseling 10 minutes.   F/U with PCP.  Butch Penny, PharmD, Patsy [place], CPP Clinical Pharmacist [facility] [phone]

## 2021-06-06 ENCOUNTER — Ambulatory Visit
Admission: RE | Admit: 2021-06-06 | Discharge: 2021-06-06 | Disposition: A | Discharge status: Home / Self Care | Payer: Medicare Other | Source: Ambulatory Visit | Attending: Internal Medicine | Admitting: Internal Medicine

## 2021-06-06 DIAGNOSIS — K7689 Other specified diseases of liver: Secondary | ICD-10-CM

## 2021-06-11 ENCOUNTER — Telehealth: Payer: Self-pay | Admitting: Nurse Practitioner

## 2021-06-11 ENCOUNTER — Other Ambulatory Visit: Payer: Self-pay | Admitting: Nurse Practitioner

## 2021-06-11 DIAGNOSIS — R160 Hepatomegaly, not elsewhere classified: Secondary | ICD-10-CM

## 2021-06-11 DIAGNOSIS — Z1211 Encounter for screening for malignant neoplasm of colon: Secondary | ICD-10-CM

## 2021-06-11 DIAGNOSIS — K7689 Other specified diseases of liver: Secondary | ICD-10-CM

## 2021-06-11 NOTE — Progress Notes (Signed)
Please let [name] know he has been referred to GI for colonoscopy and results from recent imaging

## 2021-06-11 NOTE — Telephone Encounter (Signed)
-----   Message from Christell Constant, MD sent at 06/10/2021  7:54 PM EST ----- Results: Likely Liver Cyst Plan: Will CC PCP, as may potentially need future non cardiac f/u  Christell Constant, MD

## 2021-06-12 ENCOUNTER — Telehealth (INDEPENDENT_AMBULATORY_CARE_PROVIDER_SITE_OTHER): Payer: Self-pay

## 2021-06-12 ENCOUNTER — Encounter (HOSPITAL_COMMUNITY): Payer: Self-pay | Admitting: Psychiatry

## 2021-06-12 ENCOUNTER — Other Ambulatory Visit: Payer: Self-pay

## 2021-06-12 ENCOUNTER — Telehealth (INDEPENDENT_AMBULATORY_CARE_PROVIDER_SITE_OTHER): Payer: Medicare Other | Admitting: Psychiatry

## 2021-06-12 DIAGNOSIS — F209 Schizophrenia, unspecified: Secondary | ICD-10-CM

## 2021-06-12 DIAGNOSIS — N400 Enlarged prostate without lower urinary tract symptoms: Secondary | ICD-10-CM

## 2021-06-12 MED ORDER — BUSPIRONE HCL 15 MG PO TABS: 15.0000 mg | ORAL_TABLET | Freq: Three times a day (TID) | ORAL | 3 refills | Status: DC

## 2021-06-12 MED ORDER — OLANZAPINE 20 MG PO TABS: 20.0000 mg | ORAL_TABLET | Freq: Every day | ORAL | 3 refills | Status: DC

## 2021-06-12 MED ORDER — PRAZOSIN HCL 1 MG PO CAPS: 1.0000 mg | ORAL_CAPSULE | Freq: Every day | ORAL | 3 refills | Status: DC

## 2021-06-12 NOTE — Telephone Encounter (Signed)
Copied from CRM [id number]. Topic: General - Other >> Jun 11, 2021  2:48 PM Traci Sermon wrote: Reason for CRM: Pt called in wanting to get his imaging results, please advise.

## 2021-06-12 NOTE — Progress Notes (Signed)
BH MD/PA/NP OP Progress Note Virtual Visit via Telephone Note  I connected with [name] on 06/12/21 at  3:30 PM EST by telephone and verified that I am speaking with the correct person using two identifiers.  Location: Patient: home Provider: Clinic   I discussed the limitations, risks, security and privacy concerns of performing an evaluation and management service by telephone and the availability of in person appointments. I also discussed with the patient that there may be a patient responsible charge related to this service. The patient expressed understanding and agreed to proceed.   I provided 30 minutes of non-face-to-face time during this encounter.  03/13/2021 1:36 PM [name]  MRN:  [id number]  Chief Complaint: "The Buspar helps me" Chief Complaint   Medication Management    HPI: 70 year old male seen today for follow up psychiatric evaluation  He has a psychiatric history of schizophrenia, schizoaffective disorder, bipolar disorder, tobacco use, and PTSD.  He is currently managed on Zyprexa 20 mg daily, prazosin 1 mg nightly, and BuSpar 15 mg 3 times daily.  Today he notes his medications are effective in managing his psychiatric conditions.   Today patient was unable to login virtually so his assessment was done over the phone. During exam he was pleasant, cooperative, and engaged in conversation.  He informed provider that overall he doing well. He finds Buspar is effective in managing his anxiety and depression. He also reports that his mood is stable on Zyprexa. Patient notes that he thought that he was having VH of bugs in his room at [facility] but notes that he later realized the bugs were real. Today he denies SI/HI/VAH, mania, or paranoia. Patient notes his anxiety and depression are minimal however notes that he does not want to do a GAD 7 or a PHQ 9 today. He notes that he will do it at his next visit.  He endorsed adequate sleep and appetite.     Patient informed Clinical research associate that recently he started another medication for blood pressure. Provider unable to locate this in his medical records. Provider informed patient that Prazosin can interfere with other medications for blood pressure and asked him if he was experiencing any side effect such as dizziness or confusion. He notes that he was not. Provider informed patient that if side effects occur Prazosin could be discontinued. He endorsed understanding.   No medication changes made today.  Patient agreeable to continue medications as prescribed.  No other concerns noted at this time. Visit Diagnosis:    ICD-10-CM   1. Schizophrenia, unspecified type (HCC)  F20.9 busPIRone (BUSPAR) 15 MG tablet    OLANZapine (ZYPREXA) 20 MG tablet    2. Benign prostatic hyperplasia, unspecified whether lower urinary tract symptoms present  N40.0 prazosin (MINIPRESS) 1 MG capsule      Past Psychiatric History: schizophrenia, schizoaffective disorder, bipolar disorder, tobacco use, and PTSD  Past Medical History:  Past Medical History:  Diagnosis Date   Acute urinary retention    Anxiety    Brain injury (HCC) 1975   Complication of anesthesia 08/29/13   difficulty due to large tongue, anterior larynx and limited opening   Difficult intubation 08/31/13   Head injury 1975   "Brain Stem Contusion"   Manic depression (HCC)    Pelvic fracture (HCC) 09/21/13   Inferior Pubic Ramus, Left Superior Ramus   Schizo affective schizophrenia (HCC)    Schizophrenia (HCC)     Past Surgical History:  Procedure Laterality Date  COSMETIC SURGERY Left 1975   created new ear lobe   DRESSING CHANGE UNDER ANESTHESIA Left 08/31/2013   Procedure: DRESSING CHANGE UNDER ANESTHESIA for right ankle and left thigh;  Surgeon: Budd Palmer, MD;  Location: [facility] OR;  Service: Orthopedics;  Laterality: Left;   I & D EXTREMITY Bilateral 08/30/2013   Procedure: IRRIGATION AND DEBRIDEMENT with closure Left Thigh wound, Irrigation  and debridement Right Ankle ;  Surgeon: Harvie Junior, MD;  Location: [facility] OR;  Service: Orthopedics;  Laterality: Bilateral;   I & D EXTREMITY Left 09/26/2013   Procedure: LEFT IRRIGATION AND DEBRIDEMENT Robby Sermon;  Surgeon: Budd Palmer, MD;  Location: [facility] OR;  Service: Orthopedics;  Laterality: Left;   I & D EXTREMITY Left 10/02/2013   Procedure: IRRIGATION AND DEBRIDEMENT EXTREMITY;  Surgeon: Budd Palmer, MD;  Location: [facility] OR;  Service: Orthopedics;  Laterality: Left;   INCISION AND DRAINAGE OF WOUND Left 09/21/2013   Procedure: IRRIGATION AND DEBRIDEMENT SOFT TISSUE WOUND WITH LARGE WOUND VAC PLACEMENT;  Surgeon: Budd Palmer, MD;  Location: [facility] OR;  Service: Orthopedics;  Laterality: Left;   MANDIBLE FRACTURE SURGERY  1975   SACRO-ILIAC PINNING Left 08/31/2013   Procedure: LEFT SACRO-ILIAC PINNING;  Surgeon: Budd Palmer, MD;  Location: [facility] OR;  Service: Orthopedics;  Laterality: Left;   SKIN GRAFT     SKIN GRAFT Left 10/31/2013   left thigh      DR HANDY    SKIN SPLIT GRAFT Left 10/02/2013   Procedure: SKIN GRAFT SPLIT THICKNESS;  Surgeon: Budd Palmer, MD;  Location: [facility] OR;  Service: Orthopedics;  Laterality: Left;   SKIN SPLIT GRAFT Left 10/31/2013   Procedure: LEFT THIGH SKIN GRAFT SPLIT THICKNESS;  Surgeon: Budd Palmer, MD;  Location: [facility] OR;  Service: Orthopedics;  Laterality: Left;    Family Psychiatric History: Denies  Family History:  Family History  Problem Relation Age of Onset   Diabetes Mother    Obsessive Compulsive Disorder Father    Bone cancer Father     Social History:  Social History   Socioeconomic History   Marital status: Single    Spouse name: Not on file   Number of children: Not on file   Years of education: Not on file   Highest education level: Not on file  Occupational History   Not on file  Tobacco Use   Smoking status: Every Day    Packs/day: 1.50    Types: Cigarettes    Last attempt to quit: 08/29/2013    Years since  quitting: 7.5   Smokeless tobacco: Never  Vaping Use   Vaping Use: Never used  Substance and Sexual Activity   Alcohol use: No   Drug use: No   Sexual activity: Never    Comment: Quit smolikg 08/29/2013  Other Topics Concern   Not on file  Social History Narrative   Not on file   Social Determinants of Health   Financial Resource Strain: Not on file  Food Insecurity: Not on file  Transportation Needs: Not on file  Physical Activity: Not on file  Stress: Not on file  Social Connections: Not on file    Allergies:  Allergies  Allergen Reactions   Penicillins Other (See Comments)    Unsure of reaction, had allergic reaction as a child and has not taken since then    Metabolic Disorder Labs: Lab Results  Component Value Date   HGBA1C 5.5 10/23/2020   No results found for:  PROLACTIN Lab Results  Component Value Date   CHOL 274 (H) 10/23/2020   TRIG 447 (H) 10/23/2020   HDL 39 (L) 10/23/2020   CHOLHDL 7.0 (H) 10/23/2020   LDLCALC 151 (H) 10/23/2020   Lab Results  Component Value Date   TSH 1.250 10/23/2020    Therapeutic Level Labs: No results found for: LITHIUM No results found for: VALPROATE No components found for:  CBMZ  Current Medications: Current Outpatient Medications  Medication Sig Dispense Refill   busPIRone (BUSPAR) 15 MG tablet Take 1 tablet (15 mg total) by mouth 3 (three) times daily. 90 tablet 3   famotidine (PEPCID) 20 MG tablet Take 1 tablet (20 mg total) by mouth 2 (two) times daily. To reduce stomach acid 180 tablet 1   folic acid (FOLVITE) 1 MG tablet Take 1 mg by mouth daily.     ibuprofen (ADVIL) 600 MG tablet Take 1 tablet (600 mg total) by mouth every 8 (eight) hours as needed for moderate pain. Take after eating 90 tablet 0   lisinopril (ZESTRIL) 5 MG tablet Take 1 tablet (5 mg total) by mouth daily. To lower blood pressure 90 tablet 1   OLANZapine (ZYPREXA) 20 MG tablet Take 1 tablet (20 mg total) by mouth at bedtime. 90 tablet 3    pravastatin (PRAVACHOL) 80 MG tablet Take 1 tablet (80 mg total) by mouth at bedtime. 90 tablet 1   prazosin (MINIPRESS) 1 MG capsule Take 1 capsule (1 mg total) by mouth at bedtime. 90 capsule 3   senna-docusate (SENOKOT-S) 8.6-50 MG tablet Take 1 tablet by mouth daily.     No current facility-administered medications for this visit.     Musculoskeletal: Strength & Muscle Tone:  Unable to assess due to telephone visit Gait & Station:  Unable to assess due to telephone visit Patient leans: N/A  Psychiatric Specialty Exam: Review of Systems  Blood pressure (!) 127/93, pulse 95, height 6\' 2"  (1.88 m), weight 206 lb (93.4 kg).Body mass index is 26.45 kg/m.  General Appearance:  Unable to assess due to telephone visit  Eye Contact:   Unable to assess due to telephone visit  Speech:  Clear and Coherent and Normal Rate  Volume:  Normal  Mood:  Euthymic  Affect:  Appropriate and Congruent  Thought Process:  Coherent, Goal Directed and Linear  Orientation:  Full (Time, Place, and Person)  Thought Content: WDL and Logical   Suicidal Thoughts:  No  Homicidal Thoughts:  No  Memory:  Immediate;   Good Recent;   Good Remote;   Good  Judgement:  Good  Insight:  Good  Psychomotor Activity:   Unable to assess due to telephone visit  Concentration:  Concentration: Good and Attention Span: Good  Recall:  Good  Fund of Knowledge: Good  Language: Good  Akathisia:   Unable to assess due to telephone visit  Handed:  Right  AIMS (if indicated): Not done  Assets:  Communication Skills Desire for Improvement Financial Resources/Insurance Housing Leisure Time  ADL's:  Intact  Cognition: WNL  Sleep:  Good   Screenings: GAD-7    Flowsheet Row Clinical Support from 03/13/2021 in [facility] Office Visit from 12/31/2020 in [facility] Clinical Support from 12/12/2020 in [facility] Clinical Support from  09/17/2020 in [facility] Office Visit from 04/19/2020 in [facility]  Total GAD-7 Score 3 0 3 8 14  PHQ2-9    Flowsheet Row Clinical Support from 03/13/2021 in [facility] Office Visit from 12/31/2020 in [facility] Clinical Support from 12/12/2020 in [facility] Clinical Support from 09/17/2020 in [facility] Office Visit from 05/29/2020 in [facility]  PHQ-2 Total Score 1 0 0 0 0  PHQ-9 Total Score 4 -- 3 7 --      Flowsheet Row Clinical Support from 12/12/2020 in [facility] Clinical Support from 09/17/2020 in [facility]  C-SSRS RISK CATEGORY No Risk No Risk        Assessment and Plan: Patient notes that he is doing well on his current medication regimen.  No medication changes made today.  Patient agreeable to continue medication as prescribed.  1. Schizophrenia, unspecified type (HCC)  Continue- busPIRone (BUSPAR) 15 MG tablet; Take 1 tablet (15 mg total) by mouth daily.  Dispense: 90 tablet; Refill: 2 Continue- OLANZapine (ZYPREXA) 20 MG tablet; Take 1 tablet (20 mg total) by mouth at bedtime.  Dispense: 90 tablet; Refill: 2  2. Benign prostatic hyperplasia, unspecified whether lower urinary tract symptoms present  Continue- prazosin (MINIPRESS) 1 MG capsule; Take 1 capsule (1 mg total) by mouth at bedtime.  Dispense: 90 capsule; Refill: 2  Follow-up in 3 months  Shanna Cisco, NP 03/13/2021, 1:36 PM

## 2021-06-17 ENCOUNTER — Other Ambulatory Visit: Payer: Self-pay

## 2021-06-17 ENCOUNTER — Other Ambulatory Visit: Payer: Medicare Other | Admitting: *Deleted

## 2021-06-17 DIAGNOSIS — I700 Atherosclerosis of aorta: Secondary | ICD-10-CM

## 2021-06-17 DIAGNOSIS — E782 Mixed hyperlipidemia: Secondary | ICD-10-CM

## 2021-06-17 LAB — HEPATIC FUNCTION PANEL
ALT: 16 IU/L (ref 0–44)
AST: 21 IU/L (ref 0–40)
Albumin: 4.2 g/dL (ref 3.8–4.8)
Alkaline Phosphatase: 164 IU/L — ABNORMAL HIGH (ref 44–121)
Bilirubin Total: 1.3 mg/dL — ABNORMAL HIGH (ref 0.0–1.2)
Bilirubin, Direct: 0.25 mg/dL (ref 0.00–0.40)
Total Protein: 7.2 g/dL (ref 6.0–8.5)

## 2021-06-17 LAB — LIPID PANEL
Chol/HDL Ratio: 4.8 ratio (ref 0.0–5.0)
Cholesterol, Total: 187 mg/dL (ref 100–199)
HDL: 39 mg/dL — ABNORMAL LOW (ref >39)
LDL Chol Calc (NIH): 113 mg/dL — ABNORMAL HIGH (ref 0–99)
Triglycerides: 201 mg/dL — ABNORMAL HIGH (ref 0–149)
VLDL Cholesterol Cal: 35 mg/dL (ref 5–40)

## 2021-06-17 NOTE — Telephone Encounter (Signed)
Pt already informed about results.

## 2021-06-18 ENCOUNTER — Telehealth: Payer: Self-pay

## 2021-06-18 MED ORDER — ROSUVASTATIN CALCIUM 10 MG PO TABS: 10.0000 mg | ORAL_TABLET | Freq: Every day | ORAL | 3 refills | Status: DC

## 2021-06-18 NOTE — Telephone Encounter (Signed)
Called pt reviewed results and MD recommendations. Pt reports that he takes rosuvastatin PO QD as ordered and has not missed multiple doses.  Pt is agreeable to plan orders placed.  Pt will have f/u labs at next OV.  All questions answered.

## 2021-06-18 NOTE — Telephone Encounter (Signed)
-----   Message from Christell Constant, MD sent at 06/18/2021  2:56 PM EST ----- Results: LDL unchanged with improvement in TGs Plan:  I suspect he is not taking the rosuvastatin, we will reoffer this to him and if he is taking increase to 10 mg  Christell Constant, MD

## 2021-07-02 ENCOUNTER — Ambulatory Visit: Payer: Medicare Other | Admitting: Nurse Practitioner

## 2021-07-03 ENCOUNTER — Other Ambulatory Visit: Payer: Self-pay

## 2021-07-03 ENCOUNTER — Ambulatory Visit: Payer: Medicare Other | Attending: Nurse Practitioner | Admitting: Physician Assistant

## 2021-07-03 VITALS — BP 140/83 | HR 106 | Temp 98.7°F | Resp 18 | Ht 74.5 in | Wt 202.0 lb

## 2021-07-03 DIAGNOSIS — R000 Tachycardia, unspecified: Secondary | ICD-10-CM

## 2021-07-03 DIAGNOSIS — I10 Essential (primary) hypertension: Secondary | ICD-10-CM

## 2021-07-03 DIAGNOSIS — K7689 Other specified diseases of liver: Secondary | ICD-10-CM | POA: Diagnosis not present

## 2021-07-03 DIAGNOSIS — F209 Schizophrenia, unspecified: Secondary | ICD-10-CM

## 2021-07-03 MED ORDER — METOPROLOL SUCCINATE ER 25 MG PO TB24
25.0000 mg | ORAL_TABLET | Freq: Every day | ORAL | 1 refills | Status: DC
Start: 2021-07-03 — End: 2021-08-05

## 2021-07-03 MED ORDER — LISINOPRIL 5 MG PO TABS: 5.0000 mg | ORAL_TABLET | Freq: Every day | ORAL | 1 refills | Status: DC

## 2021-07-03 NOTE — Progress Notes (Signed)
Patient took BP medication around 7am and anxiety medication around 1pm. Patient denies pain at this time, though he reports needing a shoulder replacement of the left shoulder. Patient reports hallucinations while taking carvedilol and has stopped the medication.

## 2021-07-03 NOTE — Progress Notes (Signed)
Established Patient Office Visit  Subjective:  Patient ID: [name], male    DOB: June 22, 1951  Age: 70 y.o. MRN: [id number]  CC:  Chief Complaint  Patient presents with   Hypertension     HPI [name] presents for [facility] refills.  States that he discontinued taking the carvedilol, states that he had run out of the medication and noticed that he was no longer having visual hallucinations of bugs.  Reports that they did start shortly after starting the carvedilol.  Denies any hypertensive symptoms   Past Medical History:  Diagnosis Date   Acute urinary retention    Anxiety    Brain injury 7471   Complication of anesthesia 08/29/13   difficulty due to large tongue, anterior larynx and limited opening   Difficult intubation 08/31/13   Head injury 1975   "Brain Stem Contusion"   Manic depression ([place])    Pelvic fracture ([place]) 09/21/13   Inferior Pubic Ramus, Left Superior Ramus   Schizo affective schizophrenia ([place])    Schizophrenia ([place])     Past Surgical History:  Procedure Laterality Date   COSMETIC SURGERY Left 1975   created new ear lobe   DRESSING CHANGE UNDER ANESTHESIA Left 08/31/2013   Procedure: DRESSING CHANGE UNDER ANESTHESIA for right ankle and left thigh;  Surgeon: Rozanna Box, MD;  Location: [facility];  Service: Orthopedics;  Laterality: Left;   I & D EXTREMITY Bilateral 08/30/2013   Procedure: IRRIGATION AND DEBRIDEMENT with closure Left Thigh wound, Irrigation and debridement Right Ankle ;  Surgeon: Alta Corning, MD;  Location: [place];  Service: Orthopedics;  Laterality: Bilateral;   I & D EXTREMITY Left 09/26/2013   Procedure: LEFT IRRIGATION AND DEBRIDEMENT Retta Diones;  Surgeon: Rozanna Box, MD;  Location: [place];  Service: Orthopedics;  Laterality: Left;   I & D EXTREMITY Left 10/02/2013   Procedure: IRRIGATION AND DEBRIDEMENT EXTREMITY;  Surgeon: Rozanna Box, MD;  Location: [place];  Service: Orthopedics;  Laterality: Left;    INCISION AND DRAINAGE OF WOUND Left 09/21/2013   Procedure: IRRIGATION AND DEBRIDEMENT SOFT TISSUE WOUND WITH LARGE WOUND VAC PLACEMENT;  Surgeon: Rozanna Box, MD;  Location: [place];  Service: Orthopedics;  Laterality: Left;   MANDIBLE FRACTURE SURGERY  1975   SACRO-ILIAC PINNING Left 08/31/2013   Procedure: LEFT SACRO-ILIAC PINNING;  Surgeon: Rozanna Box, MD;  Location: [place];  Service: Orthopedics;  Laterality: Left;   SKIN GRAFT     SKIN GRAFT Left 10/31/2013   left thigh      DR HANDY    SKIN SPLIT GRAFT Left 10/02/2013   Procedure: SKIN GRAFT SPLIT THICKNESS;  Surgeon: Rozanna Box, MD;  Location: [place];  Service: Orthopedics;  Laterality: Left;   SKIN SPLIT GRAFT Left 10/31/2013   Procedure: LEFT THIGH SKIN GRAFT SPLIT THICKNESS;  Surgeon: Rozanna Box, MD;  Location: [place];  Service: Orthopedics;  Laterality: Left;    Family History  Problem Relation Age of Onset   Diabetes Mother    Obsessive Compulsive Disorder Father    Bone cancer Father     Social History   Socioeconomic History   Marital status: Single    Spouse name: Not on file   Number of children: Not on file   Years of education: Not on file   Highest education level: Not on file  Occupational History   Not on file  Tobacco Use   Smoking status: Every Day  Packs/day: 1.50    Types: Cigarettes    Last attempt to quit: 08/29/2013    Years since quitting: 7.8   Smokeless tobacco: Never  Vaping Use   Vaping Use: Never used  Substance and Sexual Activity   Alcohol use: No   Drug use: No   Sexual activity: Never    Comment: Quit smolikg 08/29/2013  Other Topics Concern   Not on file  Social History Narrative   Not on file   Social Determinants of Health   Financial Resource Strain: Not on file  Food Insecurity: Not on file  Transportation Needs: Not on file  Physical Activity: Not on file  Stress: Not on file  Social Connections: Not on file  Intimate Partner Violence: Not on file     Outpatient Medications Prior to Visit  Medication Sig Dispense Refill   busPIRone (BUSPAR) 15 MG tablet Take 1 tablet (15 mg total) by mouth 3 (three) times daily. 90 tablet 3   famotidine (PEPCID) 20 MG tablet Take 1 tablet (20 mg total) by mouth 2 (two) times daily. To reduce stomach acid 248 tablet 1   folic acid (FOLVITE) 1 MG tablet Take 1 mg by mouth daily.     OLANZapine (ZYPREXA) 20 MG tablet Take 1 tablet (20 mg total) by mouth at bedtime. 90 tablet 3   prazosin (MINIPRESS) 1 MG capsule Take 1 capsule (1 mg total) by mouth at bedtime. 90 capsule 3   rosuvastatin (CRESTOR) 10 MG tablet Take 1 tablet (10 mg total) by mouth daily. 90 tablet 3   senna-docusate (SENOKOT-S) 8.6-50 MG tablet Take 1 tablet by mouth daily.     carvedilol (COREG) 3.125 MG tablet Take 1 tablet (3.125 mg total) by mouth 2 (two) times daily with a meal. 60 tablet 3   lisinopril (ZESTRIL) 5 MG tablet Take 1 tablet (5 mg total) by mouth daily. To lower blood pressure 90 tablet 1   No facility-administered medications prior to visit.    Allergies  Allergen Reactions   Penicillins Other (See Comments)    Unsure of reaction, had allergic reaction as a child and has not taken since then    ROS Review of Systems  Constitutional: Negative.   HENT: Negative.    Eyes: Negative.   Respiratory:  Negative for shortness of breath.   Cardiovascular:  Negative for chest pain and palpitations.  Gastrointestinal: Negative.   Endocrine: Negative.   Genitourinary: Negative.   Musculoskeletal: Negative.   Skin: Negative.   Allergic/Immunologic: Negative.   Neurological: Negative.   Hematological: Negative.   Psychiatric/Behavioral:  Negative for dysphoric mood, hallucinations, self-injury, sleep disturbance and suicidal ideas. The patient is not nervous/anxious.      Objective:    Physical Exam Vitals and nursing note reviewed.  Constitutional:      Appearance: Normal appearance.  HENT:     Head:  Atraumatic.     Right Ear: External ear normal.     Left Ear: External ear normal.     Nose: Nose normal.     Mouth/Throat:     Mouth: Mucous membranes are moist.     Pharynx: Oropharynx is clear.  Eyes:     Extraocular Movements: Extraocular movements intact.     Conjunctiva/sclera: Conjunctivae normal.     Pupils: Pupils are equal, round, and reactive to light.  Cardiovascular:     Rate and Rhythm: Regular rhythm. Tachycardia present.     Pulses: Normal pulses.     Heart sounds: Normal heart  sounds.  Pulmonary:     Effort: Pulmonary effort is normal.     Breath sounds: Normal breath sounds.  Musculoskeletal:        General: Normal range of motion.     Cervical back: Normal range of motion and neck supple.  Skin:    General: Skin is warm and dry.  Neurological:     General: No focal deficit present.     Mental Status: He is alert and oriented to person, place, and time.  Psychiatric:        Mood and Affect: Mood normal.        Behavior: Behavior normal.        Thought Content: Thought content normal.        Judgment: Judgment normal.    BP 140/83 (BP Location: Left Arm, Patient Position: Sitting, Cuff Size: Normal)   Pulse (!) 106   Temp 98.7 F (37.1 C) (Oral)   Resp 18   Ht 6' 2.5" (1.892 m)   Wt 202 lb (91.6 kg)   SpO2 95%   BMI 25.59 kg/m  Wt Readings from Last 3 Encounters:  07/03/21 202 lb (91.6 kg)  04/01/21 204 lb 4 oz (92.6 kg)  03/19/21 204 lb 3.2 oz (92.6 kg)     Health Maintenance Due  Topic Date Due   Zoster Vaccines- Shingrix (1 of 2) Never done   COLON CANCER SCREENING ANNUAL FOBT  Never done   Pneumonia Vaccine 37+ Years old (2 - PCV) 11/02/2014   COVID-19 Vaccine (3 - Moderna risk series) 08/17/2020    There are no preventive care reminders to display for this patient.  Lab Results  Component Value Date   TSH 1.250 10/23/2020   Lab Results  Component Value Date   WBC 10.4 10/23/2020   HGB 17.1 10/23/2020   HCT 50.8 10/23/2020    MCV 92 10/23/2020   PLT 281 10/23/2020   Lab Results  Component Value Date   NA 139 04/01/2021   K 4.4 04/01/2021   CO2 24 04/01/2021   GLUCOSE 82 04/01/2021   BUN 11 04/01/2021   CREATININE 0.78 04/01/2021   BILITOT 1.3 (H) 06/17/2021   ALKPHOS 164 (H) 06/17/2021   AST 21 06/17/2021   ALT 16 06/17/2021   PROT 7.2 06/17/2021   ALBUMIN 4.2 06/17/2021   CALCIUM 9.7 04/01/2021   EGFR 97 04/01/2021   Lab Results  Component Value Date   CHOL 187 06/17/2021   Lab Results  Component Value Date   HDL 39 (L) 06/17/2021   Lab Results  Component Value Date   LDLCALC 113 (H) 06/17/2021   Lab Results  Component Value Date   TRIG 201 (H) 06/17/2021   Lab Results  Component Value Date   CHOLHDL 4.8 06/17/2021   Lab Results  Component Value Date   HGBA1C 5.5 10/23/2020      Assessment & Plan:   Problem List Items Addressed This Visit   None Visit Diagnoses     Essential hypertension    -  Primary   Relevant Medications   lisinopril (ZESTRIL) 5 MG tablet   metoprolol succinate (TOPROL-XL) 25 MG 24 hr tablet   Tachycardia       Liver cyst       Relevant Orders   MR LIVER W WO CONTRAST       Meds ordered this encounter  Medications   lisinopril (ZESTRIL) 5 MG tablet    Sig: Take 1 tablet (5 mg total) by mouth  daily. To lower blood pressure    Dispense:  90 tablet    Refill:  1    Order Specific Question:   Supervising Provider    Answer:   Elsie Stain [[id number]]   metoprolol succinate (TOPROL-XL) 25 MG 24 hr tablet    Sig: Take 1 tablet (25 mg total) by mouth daily.    Dispense:  30 tablet    Refill:  1    Change in therapy    Order Specific Question:   Supervising Provider    Answer:   Asencion Noble E [[id number]]   1. Essential hypertension Continue lisinopril.  Patient does suffer from schizophrenia, states compliance to behavioral health medications.  Unsure if carvedilol was the cause for visual hallucinations, however patient does not want to restart  carvedilol at this time.  Agreeable to trial of metoprolol.  Patient to follow-up with clinical pharmacist in 1 month, encouraged to check blood pressure and pulse at home, keep a written log and have available for that office visit.  Red flags given for prompt reevaluation - lisinopril (ZESTRIL) 5 MG tablet; Take 1 tablet (5 mg total) by mouth daily. To lower blood pressure  Dispense: 90 tablet; Refill: 1  2. Tachycardia  - metoprolol succinate (TOPROL-XL) 25 MG 24 hr tablet; Take 1 tablet (25 mg total) by mouth daily.  Dispense: 30 tablet; Refill: 1  3. Liver cyst Ultrasound of liver completed June 06, 2021   IMPRESSION: 1. Diffuse increased slightly coarsened echogenicity of the hepatic parenchyma suggestive of hepatic steatosis. Correlation with LFTs is advised. 2. Suspected hepatic cysts as detailed above. Further evaluation with abdominal MRI could be performed as indicated. 3. Patent hepatic vasculature with normal directional flow. 4. No stigmata of portal venous hypertension. Specifically, no evidence of splenomegaly or intra-abdominal ascites.     Electronically Signed   By: Sandi Mariscal M.D.   On: 06/06/2021 09:06    I have reviewed the patient's medical history (PMH, PSH, Social History, Family History, Medications, and allergies) , and have been updated if relevant. I spent 30 minutes reviewing chart and  face to face time with patient.   - MR LIVER W WO CONTRAST; Future  Follow-up: Return in about 4 weeks (around 07/31/2021) for with [facility]; 3 mo with Zelda.    Loraine Grip Mayers, PA-C

## 2021-07-03 NOTE — Patient Instructions (Signed)
You are going to start taking metoprolol instead of carvedilol.  You will follow-up with the clinic pharmacist in 1 month for further review.  We will call you with the results of your MRI when they are available.  Roney Jaffe, PA-C Physician Assistant [facility] [email or web address]   Sinus Tachycardia Sinus tachycardia is a kind of fast heartbeat. In sinus tachycardia, the heart beats more than 100 times a minute. Sinus tachycardia starts in a part of the heart called the sinus node. Sinus tachycardia may be harmless, or it may be a sign of a serious condition. What are the causes? This condition may be caused by: Exercise or exertion. A fever. Pain. Loss of body fluids (dehydration). Severe bleeding (hemorrhage). Anxiety and stress. Certain substances, including: Alcohol. Caffeine. Tobacco and nicotine products. Cold medicines. Illegal drugs. Medical conditions including: Heart disease. An infection. An overactive thyroid (hyperthyroidism). A lack of red blood cells (anemia). What are the signs or symptoms? Symptoms of this condition include: A feeling that the heart is beating quickly (palpitations). Suddenly noticing your heartbeat (cardiac awareness). Dizziness. Tiredness (fatigue). Shortness of breath. Chest pain. Nausea. Fainting. How is this diagnosed? This condition is diagnosed with: A physical exam. Other tests, such as: Blood tests. An electrocardiogram (ECG). This test measures the electrical activity of the heart. Ambulatory cardiac monitor. This records your heartbeats for 24 hours or more. You may be referred to a heart specialist (cardiologist). How is this treated? Treatment for this condition depends on the cause or the underlying condition. Treatment may involve: Treating the underlying condition. Taking new medicines or changing your current medicines as told by your health care  provider. Making changes to your diet or lifestyle. Follow these instructions at home: Lifestyle  Do not use any products that contain nicotine or tobacco, such as cigarettes and e-cigarettes. If you need help quitting, ask your health care provider. Do not use illegal drugs, such as cocaine. Learn relaxation methods to help you when you get stressed or anxious. These include deep breathing. Avoid caffeine or other stimulants. Alcohol use  Do not drink alcohol if: Your health care provider tells you not to drink. You are pregnant, may be pregnant, or are planning to become pregnant. If you drink alcohol, limit how much you have: 0-1 drink a day for women. 0-2 drinks a day for men. Be aware of how much alcohol is in your drink. In the [place] one drink equals one typical bottle of beer (12 oz), one-half glass of wine (5 oz), or one shot of hard liquor (1 oz). General instructions Drink enough fluids to keep your urine pale yellow. Take over-the-counter and prescription medicines only as told by your health care provider. Keep all follow-up visits as told by your health care provider. This is important. Contact a health care provider if you have: A fever. Vomiting or diarrhea that does not go away. Get help right away if you: Have pain in your chest, upper arms, jaw, or neck. Become weak or dizzy. Feel faint. Have palpitations that do not go away. Summary In sinus tachycardia, the heart beats more than 100 times a minute. Sinus tachycardia may be harmless, or it may be a sign of a serious condition. Treatment for this condition depends on the cause or the underlying condition. Get help right away if you have pain in your chest, upper arms, jaw, or neck. This information is not intended to replace advice given to you by your health care provider. Make  sure you discuss any questions you have with your health care provider. Document Revised: 11/28/2020 Document Reviewed:  11/28/2020 Elsevier Patient Education  2022 [employer].

## 2021-07-05 ENCOUNTER — Encounter: Payer: Self-pay | Admitting: Physician Assistant

## 2021-07-05 DIAGNOSIS — I10 Essential (primary) hypertension: Secondary | ICD-10-CM | POA: Insufficient documentation

## 2021-07-05 DIAGNOSIS — R000 Tachycardia, unspecified: Secondary | ICD-10-CM | POA: Insufficient documentation

## 2021-07-05 DIAGNOSIS — K7689 Other specified diseases of liver: Secondary | ICD-10-CM | POA: Insufficient documentation

## 2021-08-05 ENCOUNTER — Other Ambulatory Visit: Payer: Self-pay

## 2021-08-05 ENCOUNTER — Ambulatory Visit: Payer: Medicare Other | Attending: Nurse Practitioner | Admitting: Pharmacist

## 2021-08-05 VITALS — BP 132/79 | HR 106

## 2021-08-05 DIAGNOSIS — I10 Essential (primary) hypertension: Secondary | ICD-10-CM | POA: Diagnosis not present

## 2021-08-05 MED ORDER — METOPROLOL SUCCINATE ER 50 MG PO TB24: 50.0000 mg | ORAL_TABLET | Freq: Every day | ORAL | 1 refills | Status: DC

## 2021-08-05 NOTE — Progress Notes (Signed)
° °  S:    PCP: Fleming  Patient arrives in good spirits. Presents to the clinic for hypertension evaluation, counseling, and management. Patient was referred and last seen by Cari on 07/03/2021. BP was 140/83 at that visit. His carvedilol was changed to metoprolol d/t side effects.  Medication adherence reported. Denies any side effects to the metoprolol. Taking this and the lisinopril daily.   Current BP Medications include:  metoprolol succinate 25 mg daily, lisinopril 5 mg daily, prazosin 1 mg  Antihypertensives tried in the past include: none  Dietary habits include: not compliant with salt restriction. Eats mainly TV dinners and "cups of noodles" Exercise habits include: none Family / Social history:   -Fhx: DM -Tobacco: current 1.5 PPD smoker  -Alcohol: none reported   ASCVD risk factors include: HTN, HLD, tobacco abuse, aortic atherosclerosis   O: Vitals:   08/05/21 1453  BP: 132/79  Pulse: (!) 106   Home BP readings: No home BP monitor.   Last 3 Office BP readings: BP Readings from Last 3 Encounters:  08/05/21 132/79  07/03/21 140/83  05/29/21 122/73   BMET    Component Value Date/Time   NA 139 04/01/2021 1551   K 4.4 04/01/2021 1551   CL 100 04/01/2021 1551   CO2 24 04/01/2021 1551   GLUCOSE 82 04/01/2021 1551   GLUCOSE 91 10/31/2013 0644   BUN 11 04/01/2021 1551   CREATININE 0.78 04/01/2021 1551   CALCIUM 9.7 04/01/2021 1551   GFRNONAA 87 04/19/2020 0941   GFRAA 101 04/19/2020 0941   Renal function: CrCl cannot be calculated (Patient's most recent lab result is older than the maximum 21 days allowed.).  Clinical ASCVD: Yes, Minimal descending aortic atherosclerosis confirmed on a CT chest on 08/29/2013 The 10-year ASCVD risk score (Arnett DK, et al., 2019) is: 27.2%   Values used to calculate the score:     Age: 44 years     Sex: Male     Is Non-Hispanic African American: No     Diabetic: No     Tobacco smoker: Yes     Systolic Blood Pressure: 132  mmHg     Is BP treated: Yes     HDL Cholesterol: 39 mg/dL     Total Cholesterol: 187 mg/dL  A/P: Hypertension longstanding currently at goal on current medications. BP Goal = < 130/80 mmHg. Medication adherence reported. Will increase metoprolol dose d/t continued tachycardia.  -Continued lisinopril 5 mg daily.  -Increase metoprolol succinate to 50 mg daily.  -Counseled on lifestyle modifications for blood pressure control including reduced dietary sodium, increased exercise, adequate sleep.  Results reviewed and written information provided.   Total time in face-to-face counseling 10 minutes.   F/U with PCP.  Butch Penny, PharmD, Patsy [place], CPP Clinical Pharmacist [facility] [phone]

## 2021-08-28 ENCOUNTER — Encounter: Payer: Self-pay | Admitting: Nurse Practitioner

## 2021-09-08 ENCOUNTER — Encounter (HOSPITAL_COMMUNITY): Payer: Self-pay | Admitting: Psychiatry

## 2021-09-08 ENCOUNTER — Ambulatory Visit (INDEPENDENT_AMBULATORY_CARE_PROVIDER_SITE_OTHER): Payer: Medicare Other | Admitting: Psychiatry

## 2021-09-08 DIAGNOSIS — N400 Enlarged prostate without lower urinary tract symptoms: Secondary | ICD-10-CM

## 2021-09-08 DIAGNOSIS — F209 Schizophrenia, unspecified: Secondary | ICD-10-CM

## 2021-09-08 MED ORDER — BUSPIRONE HCL 15 MG PO TABS: 15.0000 mg | ORAL_TABLET | Freq: Three times a day (TID) | ORAL | 3 refills | Status: DC

## 2021-09-08 MED ORDER — OLANZAPINE 20 MG PO TABS: 20.0000 mg | ORAL_TABLET | Freq: Every day | ORAL | 3 refills | Status: DC

## 2021-09-08 MED ORDER — PRAZOSIN HCL 1 MG PO CAPS: 1.0000 mg | ORAL_CAPSULE | Freq: Every day | ORAL | 3 refills | Status: DC

## 2021-09-08 NOTE — Progress Notes (Addendum)
BH MD/PA/NP OP Progress Note Virtual Visit via Telephone Note  I connected with [name] on 09/08/21 at  1:30 PM EST by telephone and verified that I am speaking with the correct person using two identifiers.  Location: Patient: home Provider: Clinic   I discussed the limitations, risks, security and privacy concerns of performing an evaluation and management service by telephone and the availability of in person appointments. I also discussed with the patient that there may be a patient responsible charge related to this service. The patient expressed understanding and agreed to proceed.   I provided 30 minutes of non-face-to-face time during this encounter.  09/08/2021 1:51 PM [name] BeachMichael G Molstad  MRN:  [id number]  Chief Complaint: "I have been drinking two 24 oz Budweiser and staying to myself"   HPI: 71 year old male seen today for follow up psychiatric evaluation  He has a psychiatric history of schizophrenia, schizoaffective disorder, bipolar disorder, tobacco use, and PTSD.  He is currently managed on Zyprexa 20 mg daily, prazosin 1 mg nightly, and BuSpar 15 mg 3 times daily.  Today he notes his medications are effective in managing his psychiatric conditions.   Today patient was unable to login virtually so his assessment was done over the phone. During exam he was pleasant, cooperative, and engaged in conversation.  He informed provider that he has been drinking two 24 ounce Budweiser and attempting to stay to himself.  He notes that he finds Budweiser effective in managing some of his psychiatric conditions.  Provider informed patient that he should not drink daily as it can interfere with his physical and mental health.  He notes that he drinks every other day.  Recently patient notes that he has been having pains in his neck.  He informed Clinical research associatewriter that he will be following up with his PCP on October 01, 2021.    Patient notes that his anxiety and depression continues to be minimal and  notes that his mood is stable.  Provider conducted a GAD-7 and patient scored a 3.  Provider also conducted PHQ-9 and patient scored a 3.  He endorses adequate appetite since receiving his food stamps and notes that his weight has been maintained.  He informed Clinical research associatewriter that he sleeps approximately 5 hours a night but notes that this is effective for him.  Today he endorses auditory hallucinations noting that he hears music but reports that he is able to cope with it.  [facility]     Overall patient notes that he is doing well. No medication changes made today.  Patient agreeable to continue medications as prescribed.  He will follow-up with community health and wellness for physical examination.  No other concerns noted at this time. Visit Diagnosis:    ICD-10-CM   1. Schizophrenia, unspecified type (HCC)  F20.9 busPIRone (BUSPAR) 15 MG tablet    OLANZapine (ZYPREXA) 20 MG tablet    2. Benign prostatic hyperplasia, unspecified whether lower urinary tract symptoms present  N40.0 prazosin (MINIPRESS) 1 MG capsule      Past Psychiatric History: schizophrenia, schizoaffective disorder, bipolar disorder, tobacco use, and PTSD  Past Medical History:  Past Medical History:  Diagnosis Date   Acute urinary retention    Anxiety    Brain injury 1975   Complication of anesthesia 08/29/13   difficulty due to large tongue, anterior larynx and limited opening   Difficult intubation 08/31/13   Head injury 1975   "Brain Stem Contusion"  Manic depression (HCC)    Pelvic fracture (HCC) 09/21/13   Inferior Pubic Ramus, Left Superior Ramus   Schizo affective schizophrenia (HCC)    Schizophrenia (HCC)     Past Surgical History:  Procedure Laterality Date   COSMETIC SURGERY Left 1975   created new ear lobe   DRESSING CHANGE UNDER ANESTHESIA Left 08/31/2013   Procedure: DRESSING CHANGE UNDER ANESTHESIA for right ankle and left thigh;  Surgeon: Budd Palmer, MD;  [facility];  Service: Orthopedics;  Laterality: Left;   I & D EXTREMITY Bilateral 08/30/2013   Procedure: IRRIGATION AND DEBRIDEMENT with closure Left Thigh wound, Irrigation and debridement Right Ankle ;  Surgeon: Harvie Junior, MD;  [facility]: [facility] OR;  Service: Orthopedics;  Laterality: Bilateral;   I & D EXTREMITY Left 09/26/2013   Procedure: LEFT IRRIGATION AND DEBRIDEMENT Robby Sermon;  Surgeon: Budd Palmer, MD;  Location: [facility] OR;  Service: Orthopedics;  Laterality: Left;   I & D EXTREMITY Left 10/02/2013   Procedure: IRRIGATION AND DEBRIDEMENT EXTREMITY;  Surgeon: Budd Palmer, MD;  [facility] OR;  Service: Orthopedics;  Laterality: Left;   INCISION AND DRAINAGE OF WOUND Left 09/21/2013   Procedure: IRRIGATION AND DEBRIDEMENT SOFT TISSUE WOUND WITH LARGE WOUND VAC PLACEMENT;  Surgeon: Budd Palmer, MD;  [facility];  Service: Orthopedics;  Laterality: Left;   MANDIBLE FRACTURE SURGERY  1975   SACRO-ILIAC PINNING Left 08/31/2013   Procedure: LEFT SACRO-ILIAC PINNING;  Surgeon: Budd Palmer, MD;  Location: [facility] OR;  Service: Orthopedics;  Laterality: Left;   SKIN GRAFT     SKIN GRAFT Left 10/31/2013   left thigh      DR HANDY    SKIN SPLIT GRAFT Left 10/02/2013   Procedure: SKIN GRAFT SPLIT THICKNESS;  Surgeon: Budd Palmer, MD;  Location: [facility] OR;  Service: Orthopedics;  Laterality: Left;   SKIN SPLIT GRAFT Left 10/31/2013   Procedure: LEFT THIGH SKIN GRAFT SPLIT THICKNESS;  Surgeon: Budd Palmer, MD;  [facility];  Service: Orthopedics;  Laterality: Left;    Family Psychiatric History: Denies  Family History:  Family History  Problem Relation Age of Onset   Diabetes Mother    Obsessive Compulsive Disorder Father    Bone cancer Father     Social History:  Social History   Socioeconomic History   Marital status: Single    Spouse name: Not on file   Number of children: Not on file   Years of education: Not on file   Highest education level: Not on file   Occupational History   Not on file  Tobacco Use   Smoking status: Every Day    Packs/day: 1.50    Types: Cigarettes    Last attempt to quit: 08/29/2013    Years since quitting: 8.0   Smokeless tobacco: Never  Vaping Use   Vaping Use: Never used  Substance and Sexual Activity   Alcohol use: No   Drug use: No   Sexual activity: Never    Comment: Quit smolikg 08/29/2013  Other Topics Concern   Not on file  Social History Narrative   Not on file   Social Determinants of Health   Financial Resource Strain: Not on file  Food Insecurity: Not on file  Transportation Needs: Not on file  Physical Activity: Not on file  Stress: Not on file  Social Connections: Not on file    Allergies:  Allergies  Allergen Reactions   Penicillins Other (See  Comments)    Unsure of reaction, had allergic reaction as a child and has not taken since then    Metabolic Disorder Labs: Lab Results  Component Value Date   HGBA1C 5.5 10/23/2020   No results found for: PROLACTIN Lab Results  Component Value Date   CHOL 187 06/17/2021   TRIG 201 (H) 06/17/2021   HDL 39 (L) 06/17/2021   CHOLHDL 4.8 06/17/2021   LDLCALC 113 (H) 06/17/2021   LDLCALC 112 (H) 04/01/2021   Lab Results  Component Value Date   TSH 1.250 10/23/2020    Therapeutic Level Labs: No results found for: LITHIUM No results found for: VALPROATE No components found for:  CBMZ  Current Medications: Current Outpatient Medications  Medication Sig Dispense Refill   busPIRone (BUSPAR) 15 MG tablet Take 1 tablet (15 mg total) by mouth 3 (three) times daily. 90 tablet 3   famotidine (PEPCID) 20 MG tablet Take 1 tablet (20 mg total) by mouth 2 (two) times daily. To reduce stomach acid 180 tablet 1   folic acid (FOLVITE) 1 MG tablet Take 1 mg by mouth daily.     lisinopril (ZESTRIL) 5 MG tablet Take 1 tablet (5 mg total) by mouth daily. To lower blood pressure 90 tablet 1   metoprolol succinate (TOPROL-XL) 50 MG 24 hr tablet Take  1 tablet (50 mg total) by mouth daily. Take with or immediately following a meal. 90 tablet 1   OLANZapine (ZYPREXA) 20 MG tablet Take 1 tablet (20 mg total) by mouth at bedtime. 90 tablet 3   prazosin (MINIPRESS) 1 MG capsule Take 1 capsule (1 mg total) by mouth at bedtime. 90 capsule 3   rosuvastatin (CRESTOR) 10 MG tablet Take 1 tablet (10 mg total) by mouth daily. 90 tablet 3   senna-docusate (SENOKOT-S) 8.6-50 MG tablet Take 1 tablet by mouth daily.     No current facility-administered medications for this visit.     Musculoskeletal: Strength & Muscle Tone:  Unable to assess due to telephone visit Gait & Station:  Unable to assess due to telephone visit Patient leans: N/A  Psychiatric Specialty Exam: Review of Systems  There were no vitals taken for this visit.There is no height or weight on file to calculate BMI.  General Appearance:  Unable to assess due to telephone visit  Eye Contact:   Unable to assess due to telephone visit  Speech:  Clear and Coherent and Normal Rate  Volume:  Normal  Mood:  Euthymic  Affect:  Appropriate and Congruent  Thought Process:  Coherent, Goal Directed, and Linear  Orientation:  Full (Time, Place, and Person)  Thought Content: Logical and Hallucinations: Auditory   Suicidal Thoughts:  No  Homicidal Thoughts:  No  Memory:  Immediate;   Good Recent;   Good Remote;   Good  Judgement:  Good  Insight:  Good  Psychomotor Activity:   Unable to assess due to telephone visit  Concentration:  Concentration: Good and Attention Span: Good  Recall:  Good  Fund of Knowledge: Good  Language: Good  Akathisia:   Unable to assess due to telephone visit  Handed:  Right  AIMS (if indicated): Not done  Assets:  Communication Skills Desire for Improvement Financial Resources/Insurance Housing Leisure Time  ADL's:  Intact  Cognition: WNL  Sleep:  Fair   Screenings: GAD-7    Garment/textile technologist Visit from 09/08/2021 in [facility] Office Visit from 07/03/2021 in [facility] Office  Visit from 04/01/2021 in [facility] Clinical Support from 03/13/2021 in [facility] Office Visit from 12/31/2020 in [facility]  Total GAD-7 Score 3 3 0 3 0      PHQ2-9    Flowsheet Row Office Visit from 09/08/2021 in [facility] Office Visit from 07/03/2021 in [facility] Office Visit from 04/01/2021 in [facility] Clinical Support from 03/13/2021 in [facility] Office Visit from 12/31/2020 in [facility]  PHQ-2 Total Score 0 0 0 1 0  PHQ-9 Total Score 3 -- 0 4 --      Flowsheet Row Clinical Support from 12/12/2020 in [facility] Clinical Support from 09/17/2020 in [facility]  C-SSRS RISK CATEGORY No Risk No Risk        Assessment and Plan: Patient notes that he is doing well on his current medication regimen.  No medication changes made today.  Patient agreeable to continue medication as prescribed.  1. Schizophrenia, unspecified type (HCC)  Continue- busPIRone (BUSPAR) 15 MG tablet; Take 1 tablet (15 mg total) by mouth daily.  Dispense: 90 tablet; Refill: 3 Continue- OLANZapine (ZYPREXA) 20 MG tablet; Take 1 tablet (20 mg total) by mouth at bedtime.  Dispense: 90 tablet; Refill: 3  2. Benign prostatic hyperplasia, unspecified whether lower urinary tract symptoms present  Continue- prazosin (MINIPRESS) 1 MG capsule; Take 1 capsule (1 mg total) by mouth at bedtime.  Dispense: 90 capsule; Refill: 3  Follow-up in 3 months  Shanna Cisco, NP 09/08/2021, 1:51 PM

## 2021-09-17 ENCOUNTER — Ambulatory Visit (INDEPENDENT_AMBULATORY_CARE_PROVIDER_SITE_OTHER): Payer: Medicare Other | Admitting: Internal Medicine

## 2021-09-17 ENCOUNTER — Other Ambulatory Visit: Payer: Self-pay

## 2021-09-17 ENCOUNTER — Encounter: Payer: Self-pay | Admitting: Internal Medicine

## 2021-09-17 VITALS — BP 132/76 | HR 78 | Ht 74.0 in | Wt 204.0 lb

## 2021-09-17 DIAGNOSIS — K7689 Other specified diseases of liver: Secondary | ICD-10-CM | POA: Diagnosis not present

## 2021-09-17 DIAGNOSIS — Z720 Tobacco use: Secondary | ICD-10-CM | POA: Diagnosis not present

## 2021-09-17 DIAGNOSIS — I700 Atherosclerosis of aorta: Secondary | ICD-10-CM | POA: Diagnosis not present

## 2021-09-17 DIAGNOSIS — E782 Mixed hyperlipidemia: Secondary | ICD-10-CM | POA: Diagnosis not present

## 2021-09-17 NOTE — Patient Instructions (Signed)
Medication Instructions:  Your physician recommends that you continue on your current medications as directed. Please refer to the Current Medication list given to you today.  *If you need a refill on your cardiac medications before your next appointment, please call your pharmacy*   Lab Work: TODAY: Lipid Panel and LFT If you have labs (blood work) drawn today and your tests are completely normal, you will receive your results only by: MyChart Message (if you have MyChart) OR A paper copy in the mail If you have any lab test that is abnormal or we need to change your treatment, we will call you to review the results.   Testing/Procedures: NONE   Follow-Up: At [facility], you and your health needs are our priority.  As part of our continuing mission to provide you with exceptional heart care, we have created designated Provider Care Teams.  These Care Teams include your primary Cardiologist (physician) and Advanced Practice Providers (APPs -  Physician Assistants and Nurse Practitioners) who all work together to provide you with the care you need, when you need it.  We recommend signing up for the patient portal called "MyChart".  Sign up information is provided on this After Visit Summary.  MyChart is used to connect with patients for Virtual Visits (Telemedicine).  Patients are able to view lab/test results, encounter notes, upcoming appointments, etc.  Non-urgent messages can be sent to your provider as well.   To learn more about what you can do with MyChart, go to [email or web address].    Your next appointment:   6 month(s) see APP  The format for your next appointment:   In Person  Provider:   Christell Constant, MD  in 1 year

## 2021-09-17 NOTE — Progress Notes (Signed)
Cardiology Office Note:    Date:  09/17/2021   HIGH-der  ID:  [name], DOB 01-10-1951, MRN [id number]  PCP:  Claiborne RiggFleming, Zelda W, NP   [facility] Providers Cardiologist:  Christell ConstantMahesh A Ariany Kesselman, MD     CC: Follow up tachycardia   History of Present Illness:    [name] is a 71 y.o. male with a hx of Schizophrenia, Tobacco Abuse, HLD and aortic atherosclerosis seen in 2022.  Started on metoprolol succinate 50 mg in the community without issues.  Notes that most of his care changed after [place] [employer]; [place]. In 2022 had normal heart monitor, and a likely incidental liver cyst found one cardiac testing; results sent to PCP.  Patient notes that he is doing OK. Is by himself a lot but likes it; he ha some time with friends There are no interval hospital/ED visit.    No chest pain or pressure.  Still smoking but he has been cutting back (cigars only no cigarettes). No SOB/DOE and no PND/Orthopnea.  Notes some weight gain. No leg swelling.  No palpitations or syncope .   Past Medical History:  Diagnosis Date   Acute urinary retention    Anxiety    Brain injury 1975   Complication of anesthesia 08/29/13   difficulty due to large tongue, anterior larynx and limited opening   Difficult intubation 08/31/13   Head injury 1975   "Brain Stem Contusion"   Manic depression (HCC)    Pelvic fracture (HCC) 09/21/13   Inferior Pubic Ramus, Left Superior Ramus   Schizo affective schizophrenia (HCC)    Schizophrenia (HCC)     Past Surgical History:  Procedure Laterality Date   COSMETIC SURGERY Left 1975   created new ear lobe   DRESSING CHANGE UNDER ANESTHESIA Left 08/31/2013   Procedure: DRESSING CHANGE UNDER ANESTHESIA for right ankle and left thigh;  Surgeon: Budd PalmerMichael H Handy, MD;  [facility] OR;  Service: Orthopedics;  Laterality: Left;   I & D EXTREMITY Bilateral 08/30/2013   Procedure: IRRIGATION  AND DEBRIDEMENT with closure Left Thigh wound, Irrigation and debridement Right Ankle ;  Surgeon: Harvie JuniorJohn L Graves, MD;  Location: [facility] OR;  Service: Orthopedics;  Laterality: Bilateral;   I & D EXTREMITY Left 09/26/2013   Procedure: LEFT IRRIGATION AND DEBRIDEMENT Robby SermonEXTREMITY/WOUND VAC;  Surgeon: Budd PalmerMichael H Handy, MD;  [facility];  Service: Orthopedics;  Laterality: Left;   I & D EXTREMITY Left 10/02/2013   Procedure: IRRIGATION AND DEBRIDEMENT EXTREMITY;  Surgeon: Budd PalmerMichael H Handy, MD;  [facility]: [facility] OR;  Service: Orthopedics;  Laterality: Left;   INCISION AND DRAINAGE OF WOUND Left 09/21/2013   Procedure: IRRIGATION AND DEBRIDEMENT SOFT TISSUE WOUND WITH LARGE WOUND VAC PLACEMENT;  Surgeon: Budd PalmerMichael H Handy, MD;  [facility] OR;  Service: Orthopedics;  Laterality: Left;   MANDIBLE FRACTURE SURGERY  1975   SACRO-ILIAC PINNING Left 08/31/2013   Procedure: LEFT SACRO-ILIAC PINNING;  Surgeon: Budd PalmerMichael H Handy, MD;  [facility];  Service: Orthopedics;  Laterality: Left;   SKIN GRAFT     SKIN GRAFT Left 10/31/2013   left thigh      DR HANDY    SKIN SPLIT GRAFT Left 10/02/2013   Procedure: SKIN GRAFT SPLIT THICKNESS;  Surgeon: Budd PalmerMichael H Handy, MD;  [facility];  Service: Orthopedics;  Laterality: Left;   SKIN SPLIT GRAFT Left 10/31/2013   Procedure: LEFT THIGH SKIN GRAFT SPLIT THICKNESS;  Surgeon: Budd Palmer, MD;  Location: [facility] OR;  Service: Orthopedics;  Laterality: Left;    Current Medications: Current Meds  Medication Sig   busPIRone (BUSPAR) 15 MG tablet Take 1 tablet (15 mg total) by mouth 3 (three) times daily.   lisinopril (ZESTRIL) 5 MG tablet Take 1 tablet (5 mg total) by mouth daily. To lower blood pressure   metoprolol succinate (TOPROL-XL) 50 MG 24 hr tablet Take 1 tablet (50 mg total) by mouth daily. Take with or immediately following a meal.   OLANZapine (ZYPREXA) 20 MG tablet Take 1 tablet (20 mg total) by mouth at bedtime.   prazosin (MINIPRESS) 1 MG capsule Take 1 capsule (1 mg  total) by mouth at bedtime.   rosuvastatin (CRESTOR) 10 MG tablet Take 1 tablet (10 mg total) by mouth daily.   [DISCONTINUED] famotidine (PEPCID) 20 MG tablet Take 1 tablet (20 mg total) by mouth 2 (two) times daily. To reduce stomach acid   [DISCONTINUED] folic acid (FOLVITE) 1 MG tablet Take 1 mg by mouth daily.   [DISCONTINUED] senna-docusate (SENOKOT-S) 8.6-50 MG tablet Take 1 tablet by mouth daily.     Allergies:   Penicillins   Social History   Socioeconomic History   Marital status: Single    Spouse name: Not on file   Number of children: Not on file   Years of education: Not on file   Highest education level: Not on file  Occupational History   Not on file  Tobacco Use   Smoking status: Every Day    Packs/day: 1.50    Types: Cigarettes    Last attempt to quit: 08/29/2013    Years since quitting: 8.0   Smokeless tobacco: Never  Vaping Use   Vaping Use: Never used  Substance and Sexual Activity   Alcohol use: No   Drug use: No   Sexual activity: Never    Comment: Quit smolikg 08/29/2013  Other Topics Concern   Not on file  Social History Narrative   Not on file   Social Determinants of Health   Financial Resource Strain: Not on file  Food Insecurity: Not on file  Transportation Needs: Not on file  Physical Activity: Not on file  Stress: Not on file  Social Connections: Not on file     Family History: The patient's family history includes Bone cancer in his father; Diabetes in his mother; Obsessive Compulsive Disorder in his father.  ROS:   Please see the history of present illness.    Has significant coughing fits. All other systems reviewed and are negative.  EKGs/Labs/Other Studies Reviewed:    The following studies were reviewed today:  EKG:   03/19/21: Sinus Tachycardia rate 110 bpm baseline artifact  Cardiac Event Monitoring: Date: 04/18/2021 Results: Patient had a minimum heart rate of 54 bpm, maximum heart rate of 146 bpm, and average heart  rate of 94 bpm. Predominant underlying rhythm was sinus rhythm. Isolated PACs were rare (<1.0%). Isolated PVCs were occasional (1.0%). No triggered and diary events.   No malignant arrhythmias.  CT Chest : Date: 08/29/2013 Results: Minimal descending aortic atherosclerosis   Recent Labs: 10/23/2020: Hemoglobin 17.1; Platelets 281; TSH 1.250 04/01/2021: BUN 11; Creatinine, Ser 0.78; Potassium 4.4; Sodium 139 06/17/2021: ALT 16  Recent Lipid Panel    Component Value Date/Time   CHOL 187 06/17/2021 0804   TRIG 201 (H) 06/17/2021 0804   HDL 39 (L) 06/17/2021 0804   CHOLHDL 4.8 06/17/2021 0804   LDLCALC 113 (H) 06/17/2021  5038       Physical Exam:    VS:  BP 132/76    Pulse 78    Ht 6\' 2"  (1.88 m)    Wt 92.5 kg    SpO2 98%    BMI 26.19 kg/m     Wt Readings from Last 3 Encounters:  09/17/21 92.5 kg  07/03/21 91.6 kg  04/01/21 92.6 kg     GEN:  Appears older than stated age, smells of smoke HEENT: Bilateral Frank's Sign NECK: No JVD LYMPHATICS: No lymphadenopathy CARDIAC: RRR, no murmurs, rubs, gallops RESPIRATORY:  Inspiratory and Expiratory wheezes, notable coughing fit; then rhonchi resolved ABDOMEN: Soft, non-tender, non-distended MUSCULOSKELETAL:  No edema; No deformity  SKIN: Warm and dry NEUROLOGIC:  Alert and oriented x 3 PSYCHIATRIC:  Normal affect   ASSESSMENT:    1. Tobacco abuse   2. Aortic atherosclerosis (HCC)   3. Liver cyst   4. Mixed hyperlipidemia     PLAN:    Active Tobacco abuse COPD with sinus tachycardia - down to just cigars, we discussed smoking cessation - we have discussed abdominal MRI; he notes he has a cyst in the past; will defer final decision to Ms. Flemming for additional testing, patient is ambivalent about testing at this time - metoprolol 50 mg PO daily tolerated despite COPD  HLD and Aortic Atherosclerosis Alcohol use (dow to 2 24 oz Budweisers) - on  rosuvastatin 10 mg PO Daily; lipids and full LFTs today - discussed  alcohol cessation  APP in six months Me in one year   Medication Adjustments/Labs and Tests Ordered: Current medicines are reviewed at length with the patient today.  Concerns regarding medicines are outlined above.  Orders Placed This Encounter  Procedures   Lipid panel   Hepatic function panel    No orders of the defined types were placed in this encounter.    Patient Instructions  Medication Instructions:  Your physician recommends that you continue on your current medications as directed. Please refer to the Current Medication list given to you today.  *If you need a refill on your cardiac medications before your next appointment, please call your pharmacy*   Lab Work: TODAY: Lipid Panel and LFT If you have labs (blood work) drawn today and your tests are completely normal, you will receive your results only by: MyChart Message (if you have MyChart) OR A paper copy in the mail If you have any lab test that is abnormal or we need to change your treatment, we will call you to review the results.   Testing/Procedures: NONE   Follow-Up: At [facility], you and your health needs are our priority.  As part of our continuing mission to provide you with exceptional heart care, we have created designated Provider Care Teams.  These Care Teams include your primary Cardiologist (physician) and Advanced Practice Providers (APPs -  Physician Assistants and Nurse Practitioners) who all work together to provide you with the care you need, when you need it.  We recommend signing up for the patient portal called "MyChart".  Sign up information is provided on this After Visit Summary.  MyChart is used to connect with patients for Virtual Visits (Telemedicine).  Patients are able to view lab/test results, encounter notes, upcoming appointments, etc.  Non-urgent messages can be sent to your provider as well.   To learn more about what you can do with MyChart, go to  [facility].    Your next appointment:   6 month(s) see APP  The format for your next appointment:   In Person  Provider:   Christell Constant, MD  in 1 year    Signed, Christell Constant, MD  09/17/2021 4:08 PM    [facility]

## 2021-09-18 ENCOUNTER — Telehealth: Payer: Self-pay | Admitting: *Deleted

## 2021-09-18 DIAGNOSIS — E782 Mixed hyperlipidemia: Secondary | ICD-10-CM

## 2021-09-18 LAB — HEPATIC FUNCTION PANEL
ALT: 18 IU/L (ref 0–44)
AST: 18 IU/L (ref 0–40)
Albumin: 3.9 g/dL (ref 3.8–4.8)
Alkaline Phosphatase: 135 IU/L — ABNORMAL HIGH (ref 44–121)
Bilirubin Total: 0.5 mg/dL (ref 0.0–1.2)
Bilirubin, Direct: 0.16 mg/dL (ref 0.00–0.40)
Total Protein: 7 g/dL (ref 6.0–8.5)

## 2021-09-18 LAB — LIPID PANEL
Chol/HDL Ratio: 3.9 ratio (ref 0.0–5.0)
Cholesterol, Total: 155 mg/dL (ref 100–199)
HDL: 40 mg/dL (ref >39)
LDL Chol Calc (NIH): 95 mg/dL (ref 0–99)
Triglycerides: 111 mg/dL (ref 0–149)
VLDL Cholesterol Cal: 20 mg/dL (ref 5–40)

## 2021-09-18 MED ORDER — ROSUVASTATIN CALCIUM 20 MG PO TABS: 20.0000 mg | ORAL_TABLET | Freq: Every day | ORAL | 3 refills | Status: DC

## 2021-09-18 NOTE — Telephone Encounter (Signed)
Patient notified.  He will come in on Dec 16, 2021 for fasting lab work.  Prescription sent to [facility]

## 2021-09-18 NOTE — Telephone Encounter (Signed)
-----  Message from Werner Lean, MD sent at 09/18/2021  9:56 AM EST ----- Results: Alk Pho improved from prior AST ALT negative despite alcohol use T Bili improved LDL improved but above goal Plan: - increase rosuvastatin to 20 mg PO daily - lipids and full LFTs in three months - will defer repeat liver imaging to PCP  Werner Lean, MD

## 2021-09-19 ENCOUNTER — Telehealth: Payer: Self-pay

## 2021-09-19 NOTE — Telephone Encounter (Signed)
-----   Message from Claiborne Rigg, NP sent at 09/18/2021 10:00 PM EST ----- Pls call MR Penagos and let him know he needs to follow up with GI for his liver cysts. Placed in [facility] [address] [place], [place] [address]ID# [phone] Referral was placed in November and they also sent him a letter.

## 2021-09-19 NOTE — Telephone Encounter (Signed)
Pt is aware of notes and will call for his appt. Next week

## 2021-09-22 ENCOUNTER — Encounter: Payer: Self-pay | Admitting: Nurse Practitioner

## 2021-10-01 ENCOUNTER — Ambulatory Visit: Payer: Medicare Other | Attending: Nurse Practitioner | Admitting: Nurse Practitioner

## 2021-10-01 ENCOUNTER — Telehealth: Payer: Self-pay

## 2021-10-01 ENCOUNTER — Encounter: Payer: Self-pay | Admitting: Nurse Practitioner

## 2021-10-01 VITALS — BP 132/86 | HR 88 | Resp 20 | Ht 74.0 in | Wt 206.0 lb

## 2021-10-01 DIAGNOSIS — Z79899 Other long term (current) drug therapy: Secondary | ICD-10-CM | POA: Diagnosis not present

## 2021-10-01 DIAGNOSIS — F419 Anxiety disorder, unspecified: Secondary | ICD-10-CM | POA: Insufficient documentation

## 2021-10-01 DIAGNOSIS — Z09 Encounter for follow-up examination after completed treatment for conditions other than malignant neoplasm: Secondary | ICD-10-CM | POA: Insufficient documentation

## 2021-10-01 DIAGNOSIS — I10 Essential (primary) hypertension: Secondary | ICD-10-CM

## 2021-10-01 DIAGNOSIS — F319 Bipolar disorder, unspecified: Secondary | ICD-10-CM | POA: Insufficient documentation

## 2021-10-01 DIAGNOSIS — R972 Elevated prostate specific antigen [PSA]: Secondary | ICD-10-CM | POA: Diagnosis not present

## 2021-10-01 DIAGNOSIS — F17210 Nicotine dependence, cigarettes, uncomplicated: Secondary | ICD-10-CM

## 2021-10-01 DIAGNOSIS — Z122 Encounter for screening for malignant neoplasm of respiratory organs: Secondary | ICD-10-CM

## 2021-10-01 DIAGNOSIS — Z7901 Long term (current) use of anticoagulants: Secondary | ICD-10-CM | POA: Diagnosis not present

## 2021-10-01 DIAGNOSIS — Z87820 Personal history of traumatic brain injury: Secondary | ICD-10-CM | POA: Insufficient documentation

## 2021-10-01 NOTE — Telephone Encounter (Signed)
Called pt to inform about appt next week for lung cancer screening. ?
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
## 2021-10-02 ENCOUNTER — Encounter: Payer: Self-pay | Admitting: Nurse Practitioner

## 2021-10-02 ENCOUNTER — Ambulatory Visit (INDEPENDENT_AMBULATORY_CARE_PROVIDER_SITE_OTHER): Payer: Medicare Other | Admitting: Nurse Practitioner

## 2021-10-02 VITALS — BP 122/84 | HR 94 | Ht 74.0 in | Wt 205.0 lb

## 2021-10-02 DIAGNOSIS — K760 Fatty (change of) liver, not elsewhere classified: Secondary | ICD-10-CM | POA: Diagnosis not present

## 2021-10-02 DIAGNOSIS — Z1211 Encounter for screening for malignant neoplasm of colon: Secondary | ICD-10-CM | POA: Diagnosis not present

## 2021-10-02 DIAGNOSIS — K7689 Other specified diseases of liver: Secondary | ICD-10-CM

## 2021-10-02 LAB — PSA: Prostate Specific Ag, Serum: 2.6 ng/mL (ref 0.0–4.0)

## 2021-10-02 NOTE — Patient Instructions (Signed)
Your provider has ordered Cologuard testing as an option for colon cancer screening. This is performed by [employer] and may be out of network with your insurance. PRIOR to completing the test, it is YOUR responsibility to contact your insurance about covered benefits for this test. Your out of pocket expense could be anywhere from $0.00 to $649.00.  ? ?When you call to check coverage with your insurer, please provide the following information:  ? ?-The ONLY provider of Cologuard is [facility]  ?- CPT code for Cologuard is [phone].  ?-[employer] NPI # [id number]  ?-[employer] Tax ID # [id number]  ? ?[employer] (phone number [phone])  should contact you within the next week regarding your test. If you have not heard from them within the next week, please call [phone] ? ?RECOMMENDATIONS: ? ?Repeat liver function blood tests with your primary care provider in 6 months. ?Reduce bread/pasta intake to reduce the risk of developing fatty liver disease. ? ?Thank you for trusting me with your gastrointestinal care!   ? ?Noralyn Pick, CRNP ? ? ? ?BMI: ? ?If you are age 84 or older, your body mass index should be between 23-30. Your Body mass index is 26.32 kg/m?Marland Kitchen If this is out of the aforementioned range listed, please consider follow up with your Primary Care Provider. ? ?If you are age 20 or younger, your body mass index should be between 19-25. Your Body mass index is 26.32 kg/m?Marland Kitchen If this is out of the aformentioned range listed, please consider follow up with your Primary Care Provider.  ? ?MY CHART: ? ?The [facility] providers would like to encourage you to use [facility] to communicate with providers for non-urgent requests or questions.  Due to long hold times on the telephone, sending your provider a message by [facility] may be a faster and more efficient way to get a response.  Please allow 48 business hours for a response.  Please remember  that this is for non-urgent requests.  ? ? ? ?
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
## 2021-10-03 NOTE — Progress Notes (Signed)
Attending Physician's Attestation  ? ?I have reviewed the chart.  ? ?I agree with the Advanced Practitioner's note, impression, and recommendations with any updates as below. ?Reasonable to consider cross-sectional imaging.  If patient can tolerate an MRI then would recommend a liver/abdominal MRI with/without contrast.  If patient cannot tolerate an MRI then CT abdomen with/without contrast would be reasonable to further understand the liver cysts and correlate need for any further work-up.  I think this can be done in May 2023 which would be a 36-monthinterval.  At the time of his 658-monthnterval imaging, I would recommend repeat LFTs as he hopefully decrease his alcohol consumption.  Would hold on liver biopsy for now though with his longstanding alk phos elevation further work-up to rule out PBC may be considered in the future, depending on what the patient wants.  It may be a result of steatosis as is noted on his liver Doppler ultrasound.  Agree with some form of colon cancer screening and if he has chosen Cologuard then we will pursue that for now. ? ?GaJustice BritainMD ?[facility] ?Advanced [facility]oscopy ?Office # [phone] ?

## 2021-10-05 ENCOUNTER — Encounter: Payer: Self-pay | Admitting: Nurse Practitioner

## 2021-10-06 ENCOUNTER — Telehealth: Payer: Self-pay

## 2021-10-06 NOTE — Progress Notes (Signed)
Ammie, please contact the patient and let him know I consulted with Dr. Rush Landmark and he advised for the patient to undergo further liver imaging May 2023.  He told me he had metal wire in his jaw so I do not think he can have MRI.  Please send him to the lab early May for a BMP and hepatic panel and schedule him for a liver CT with and without contrast around the second week in May  if he is willing to do so. ? ? ?If his alk phos level remains elevated he will require additional laboratory studies (ANA, AMA and SMA).  ?

## 2021-10-06 NOTE — Telephone Encounter (Signed)
Called patient reviewed all information and repeated back to me. Will call if any questions.  ? ?

## 2021-10-07 ENCOUNTER — Other Ambulatory Visit: Payer: Self-pay

## 2021-10-07 DIAGNOSIS — K7689 Other specified diseases of liver: Secondary | ICD-10-CM

## 2021-10-07 DIAGNOSIS — K760 Fatty (change of) liver, not elsewhere classified: Secondary | ICD-10-CM

## 2021-10-07 NOTE — Progress Notes (Signed)
Called pt and informed of results and recommendations as reviewed and documented by Alcide Evener, NP. Verbalized acceptance and understanding. Orders placed for pt to have labs completed first week of May. Orders also placed for pt to have CT mid May. Reminder created to ensure these have been completed in a timely manner. Message sent to Radiology Scheduling to esnure pt is scheduled in a timely manner. Pt provided with contact # for scheduling if he has not heard from them by end of April 2023. ?

## 2021-10-09 ENCOUNTER — Ambulatory Visit (HOSPITAL_COMMUNITY): Payer: Medicare Other

## 2021-11-25 ENCOUNTER — Telehealth (HOSPITAL_COMMUNITY): Payer: Medicare Other | Admitting: Psychiatry

## 2021-12-02 ENCOUNTER — Other Ambulatory Visit (INDEPENDENT_AMBULATORY_CARE_PROVIDER_SITE_OTHER): Payer: Medicare Other

## 2021-12-02 DIAGNOSIS — K760 Fatty (change of) liver, not elsewhere classified: Secondary | ICD-10-CM | POA: Diagnosis not present

## 2021-12-02 DIAGNOSIS — K7689 Other specified diseases of liver: Secondary | ICD-10-CM

## 2021-12-02 LAB — BASIC METABOLIC PANEL
BUN: 10 mg/dL (ref 6–23)
CO2: 29 mEq/L (ref 19–32)
Calcium: 9.1 mg/dL (ref 8.4–10.5)
Chloride: 102 mEq/L (ref 96–112)
Creatinine, Ser: 1.01 mg/dL (ref 0.40–1.50)
GFR: 75.69 mL/min (ref >60.00)
Glucose, Bld: 105 mg/dL — ABNORMAL HIGH (ref 70–99)
Potassium: 3.9 mEq/L (ref 3.5–5.1)
Sodium: 138 mEq/L (ref 135–145)

## 2021-12-02 LAB — HEPATIC FUNCTION PANEL
ALT: 19 U/L (ref 0–53)
AST: 19 U/L (ref 0–37)
Albumin: 4.1 g/dL (ref 3.5–5.2)
Alkaline Phosphatase: 119 U/L — ABNORMAL HIGH (ref 39–117)
Bilirubin, Direct: 0.2 mg/dL (ref 0.0–0.3)
Total Bilirubin: 1 mg/dL (ref 0.2–1.2)
Total Protein: 7.3 g/dL (ref 6.0–8.3)

## 2021-12-04 NOTE — Progress Notes (Signed)
Labs obtained and resulted 12/02/21. Awaiting review and recommendations from [facility], CRNP. Scheduled for CT 12/18/21 ?

## 2021-12-12 ENCOUNTER — Ambulatory Visit (INDEPENDENT_AMBULATORY_CARE_PROVIDER_SITE_OTHER): Payer: Medicare Other | Admitting: Physician Assistant

## 2021-12-12 DIAGNOSIS — N400 Enlarged prostate without lower urinary tract symptoms: Secondary | ICD-10-CM

## 2021-12-12 DIAGNOSIS — F209 Schizophrenia, unspecified: Secondary | ICD-10-CM | POA: Diagnosis not present

## 2021-12-12 MED ORDER — OLANZAPINE 20 MG PO TABS: 20.0000 mg | ORAL_TABLET | Freq: Every day | ORAL | 3 refills | Status: DC

## 2021-12-12 MED ORDER — PRAZOSIN HCL 1 MG PO CAPS: 1.0000 mg | ORAL_CAPSULE | Freq: Every day | ORAL | 3 refills | Status: DC

## 2021-12-12 MED ORDER — BUSPIRONE HCL 15 MG PO TABS: 15.0000 mg | ORAL_TABLET | Freq: Three times a day (TID) | ORAL | 3 refills | Status: DC
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
## 2021-12-15 ENCOUNTER — Encounter (HOSPITAL_COMMUNITY): Payer: Self-pay | Admitting: Physician Assistant

## 2021-12-16 ENCOUNTER — Other Ambulatory Visit: Payer: Medicare Other

## 2021-12-18 ENCOUNTER — Ambulatory Visit (HOSPITAL_COMMUNITY)
Admission: RE | Admit: 2021-12-18 | Discharge: 2021-12-18 | Disposition: A | Discharge status: Home / Self Care | Payer: Medicare Other | Source: Ambulatory Visit | Attending: Nurse Practitioner | Admitting: Nurse Practitioner

## 2021-12-18 ENCOUNTER — Encounter (HOSPITAL_COMMUNITY): Payer: Self-pay

## 2021-12-18 DIAGNOSIS — K760 Fatty (change of) liver, not elsewhere classified: Secondary | ICD-10-CM

## 2021-12-18 DIAGNOSIS — K7689 Other specified diseases of liver: Secondary | ICD-10-CM | POA: Diagnosis present

## 2021-12-18 MED ORDER — IOHEXOL 300 MG/ML  SOLN
100.0000 mL | Freq: Once | INTRAMUSCULAR | Status: AC | PRN
  Administered 2021-12-18: 100 mL via INTRAVENOUS

## 2021-12-18 MED ORDER — SODIUM CHLORIDE (PF) 0.9 % IJ SOLN
INTRAMUSCULAR | Status: AC
  Filled 2021-12-18: qty 50

## 2021-12-19 NOTE — Progress Notes (Signed)
CT completed and resulted 12/18/21. Results not yet reviewed by provider. Will await review and any recommendations.

## 2022-01-02 ENCOUNTER — Ambulatory Visit: Payer: Medicare Other | Attending: Nurse Practitioner | Admitting: Nurse Practitioner

## 2022-01-02 ENCOUNTER — Encounter: Payer: Self-pay | Admitting: Nurse Practitioner

## 2022-01-02 VITALS — BP 124/79 | HR 88 | Wt 202.8 lb

## 2022-01-02 DIAGNOSIS — I10 Essential (primary) hypertension: Secondary | ICD-10-CM | POA: Diagnosis not present

## 2022-01-02 DIAGNOSIS — E782 Mixed hyperlipidemia: Secondary | ICD-10-CM

## 2022-01-02 DIAGNOSIS — R7309 Other abnormal glucose: Secondary | ICD-10-CM

## 2022-01-02 DIAGNOSIS — Z1211 Encounter for screening for malignant neoplasm of colon: Secondary | ICD-10-CM | POA: Diagnosis not present

## 2022-01-02 DIAGNOSIS — Z862 Personal history of diseases of the blood and blood-forming organs and certain disorders involving the immune mechanism: Secondary | ICD-10-CM

## 2022-01-02 MED ORDER — METOPROLOL SUCCINATE ER 50 MG PO TB24: 50.0000 mg | ORAL_TABLET | Freq: Every day | ORAL | 1 refills | Status: DC

## 2022-01-02 MED ORDER — LISINOPRIL 5 MG PO TABS: 5.0000 mg | ORAL_TABLET | Freq: Every day | ORAL | 1 refills | Status: DC
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
## 2022-01-02 NOTE — Patient Instructions (Signed)
[facility] [address] [place], [place] [address] PH# [phone] 

## 2022-01-03 LAB — CBC
Hematocrit: 48.6 % (ref 37.5–51.0)
Hemoglobin: 17 g/dL (ref 13.0–17.7)
MCH: 31.5 pg (ref 26.6–33.0)
MCHC: 35 g/dL (ref 31.5–35.7)
MCV: 90 fL (ref 79–97)
Platelets: 234 10*3/uL (ref 150–450)
RBC: 5.4 x10E6/uL (ref 4.14–5.80)
RDW: 13.8 % (ref 11.6–15.4)
WBC: 8.7 10*3/uL (ref 3.4–10.8)

## 2022-01-03 LAB — LIPID PANEL
Chol/HDL Ratio: 3.6 ratio (ref 0.0–5.0)
Cholesterol, Total: 182 mg/dL (ref 100–199)
HDL: 50 mg/dL (ref >39)
LDL Chol Calc (NIH): 108 mg/dL — ABNORMAL HIGH (ref 0–99)
Triglycerides: 136 mg/dL (ref 0–149)
VLDL Cholesterol Cal: 24 mg/dL (ref 5–40)

## 2022-01-03 LAB — HEMOGLOBIN A1C
Est. average glucose Bld gHb Est-mCnc: 114 mg/dL
Hgb A1c MFr Bld: 5.6 % (ref 4.8–5.6)

## 2022-01-05 ENCOUNTER — Telehealth: Payer: Self-pay | Admitting: *Deleted

## 2022-01-05 NOTE — Telephone Encounter (Signed)
Spoke with patient he did receive the Cologuard but patient states he cannot read and it unable to complete it. Patient also stated he had no one to help him complete the prep for a colonoscopy either.

## 2022-01-05 NOTE — Telephone Encounter (Signed)
Noted, I will contact patient in 2 to 3 months to see if he can have someone help him with the Cologuard test.

## 2022-01-05 NOTE — Telephone Encounter (Signed)
-----   Message from Arnaldo Natal, NP sent at 01/02/2022  4:08 PM EDT ----- Ammie, his contact patient next week and verify if he is still willing to do the Cologuard test or not.  Thank you ----- Message ----- From: SYSTEM Sent: 12/31/2021  12:12 AM EDT To: Arnaldo Natal, NP

## 2022-01-08 ENCOUNTER — Encounter: Payer: Self-pay | Admitting: Nurse Practitioner

## 2022-03-10 ENCOUNTER — Telehealth (INDEPENDENT_AMBULATORY_CARE_PROVIDER_SITE_OTHER): Payer: Medicare Other | Admitting: Psychiatry

## 2022-03-10 ENCOUNTER — Encounter (HOSPITAL_COMMUNITY): Payer: Self-pay | Admitting: Psychiatry

## 2022-03-10 DIAGNOSIS — N400 Enlarged prostate without lower urinary tract symptoms: Secondary | ICD-10-CM

## 2022-03-10 DIAGNOSIS — F209 Schizophrenia, unspecified: Secondary | ICD-10-CM

## 2022-03-10 MED ORDER — PRAZOSIN HCL 1 MG PO CAPS: 1.0000 mg | ORAL_CAPSULE | Freq: Every day | ORAL | 3 refills | Status: DC

## 2022-03-10 MED ORDER — BUSPIRONE HCL 15 MG PO TABS: 15.0000 mg | ORAL_TABLET | Freq: Three times a day (TID) | ORAL | 3 refills | Status: DC

## 2022-03-10 MED ORDER — OLANZAPINE 20 MG PO TABS: 20.0000 mg | ORAL_TABLET | Freq: Every day | ORAL | 3 refills | Status: DC

## 2022-03-10 NOTE — Progress Notes (Signed)
BH MD/PA/NP OP Progress Note Virtual Visit via Telephone Note  I connected with [name] on 03/10/22 at  3:00 PM EDT by telephone and verified that I am speaking with the correct person using two identifiers.  Location: Patient: home Provider: Clinic   I discussed the limitations, risks, security and privacy concerns of performing an evaluation and management service by telephone and the availability of in person appointments. I also discussed with the patient that there may be a patient responsible charge related to this service. The patient expressed understanding and agreed to proceed.   I provided 30 minutes of non-face-to-face time during this encounter.  03/10/2022 2:56 PM GAY RAPE  MRN:  [id number]  Chief Complaint: "I still the same"   HPI: 71 year old male seen today for follow up psychiatric evaluation  He has a psychiatric history of schizophrenia, schizoaffective disorder, bipolar disorder, tobacco use, and PTSD.  He is currently managed on Zyprexa 20 mg daily, prazosin 1 mg nightly, and BuSpar 15 mg 3 times daily.  Today he notes his medications are effective in managing his psychiatric conditions.   Today patient was unable to login virtually so his assessment was done over the phone. During exam he was pleasant, cooperative, and engaged in conversation.  He informed provider that he has been the same. He continues to live live in [place].  Patient has some delusional thinking.  He notes that he saw his name on [employer].  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  Patient reports that he has not received compensation for this film.  Patient also notes that he continues to drink 140 ounce alcoholic beverage a day.  Reports that he has minimal anxiety and depression and request not to do a GAD-7 or PHQ-9 today.  He endorses adequate sleep and appetite.  Patient notes that he continues to receive food stamps and has been eating  well.  Today he denies SI/HI/VH.  He does endorses auditory hallucinations noting that he hears music but reports that he is able to cope with it.     Overall patient notes that he is doing well. No medication changes made today.  Patient agreeable to continue medications as prescribed.  He will follow-up with community health and wellness for physical examination.  No other concerns noted at this time. Visit Diagnosis:    ICD-10-CM   1. Schizophrenia, unspecified type (HCC)  F20.9 busPIRone (BUSPAR) 15 MG tablet    OLANZapine (ZYPREXA) 20 MG tablet    2. Benign prostatic hyperplasia, unspecified whether lower urinary tract symptoms present  N40.0 prazosin (MINIPRESS) 1 MG capsule      Past Psychiatric History: schizophrenia, schizoaffective disorder, bipolar disorder, tobacco use, and PTSD  Past Medical History:  Past Medical History:  Diagnosis Date   Acute urinary retention    Anxiety    Brain injury (HCC) 1975   Complication of anesthesia 08/29/13   difficulty due to large tongue, anterior larynx and limited opening   Difficult intubation 08/31/13   Head injury 1975   "Brain Stem Contusion"   Manic depression (HCC)    Pelvic fracture (HCC) 09/21/13   Inferior Pubic Ramus, Left Superior Ramus   Schizo affective schizophrenia (HCC)    Schizophrenia (HCC)     Past Surgical History:  Procedure Laterality Date   COSMETIC SURGERY Left 1975   created new ear lobe   DRESSING CHANGE UNDER ANESTHESIA Left 08/31/2013  Procedure: DRESSING CHANGE UNDER ANESTHESIA for right ankle and left thigh;  Surgeon: Rozanna Box, MD;  Location: [facility];  Service: Orthopedics;  Laterality: Left;   I & D EXTREMITY Bilateral 08/30/2013   Procedure: IRRIGATION AND DEBRIDEMENT with closure Left Thigh wound, Irrigation and debridement Right Ankle ;  Surgeon: Alta Corning, MD;  Location: [facility];  Service: Orthopedics;  Laterality: Bilateral;   I & D EXTREMITY Left 09/26/2013   Procedure: LEFT IRRIGATION AND  DEBRIDEMENT Retta Diones;  Surgeon: Rozanna Box, MD;  Location: [facility];  Service: Orthopedics;  Laterality: Left;   I & D EXTREMITY Left 10/02/2013   Procedure: IRRIGATION AND DEBRIDEMENT EXTREMITY;  Surgeon: Rozanna Box, MD;  Location: [facility];  Service: Orthopedics;  Laterality: Left;   INCISION AND DRAINAGE OF WOUND Left 09/21/2013   Procedure: IRRIGATION AND DEBRIDEMENT SOFT TISSUE WOUND WITH LARGE WOUND VAC PLACEMENT;  Surgeon: Rozanna Box, MD;  Location: [place] [facility];  Service: Orthopedics;  Laterality: Left;   MANDIBLE FRACTURE SURGERY  1975   SACRO-ILIAC PINNING Left 08/31/2013   Procedure: LEFT SACRO-ILIAC PINNING;  Surgeon: Rozanna Box, MD;  Location: [place];  Service: Orthopedics;  Laterality: Left;   SKIN GRAFT     SKIN GRAFT Left 10/31/2013   left thigh      DR HANDY    SKIN SPLIT GRAFT Left 10/02/2013   Procedure: SKIN GRAFT SPLIT THICKNESS;  Surgeon: Rozanna Box, MD;  Location: [facility] [facility];  Service: Orthopedics;  Laterality: Left;   SKIN SPLIT GRAFT Left 10/31/2013   Procedure: LEFT THIGH SKIN GRAFT SPLIT THICKNESS;  Surgeon: Rozanna Box, MD;  Location: [place];  Service: Orthopedics;  Laterality: Left;    Family Psychiatric History: Denies  Family History:  Family History  Problem Relation Age of Onset   Diabetes Mother    Obsessive Compulsive Disorder Father    Bone cancer Father    Colon cancer Neg Hx    Rectal cancer Neg Hx    Liver cancer Neg Hx    Pancreatic cancer Neg Hx     Social History:  Social History   Socioeconomic History   Marital status: Single    Spouse name: Not on file   Number of children: Not on file   Years of education: Not on file   Highest education level: Not on file  Occupational History   Not on file  Tobacco Use   Smoking status: Every Day    Packs/day: 1.50    Types: Cigarettes    Last attempt to quit: 08/29/2013    Years since quitting: 8.5   Smokeless tobacco: Never  Vaping Use   Vaping Use: Never used  Substance  and Sexual Activity   Alcohol use: Yes    Alcohol/week: 6.0 standard drinks of alcohol    Types: 6 Cans of beer per week    Comment: weekly   Drug use: No   Sexual activity: Never    Comment: Quit smolikg 08/29/2013  Other Topics Concern   Not on file  Social History Narrative   Not on file   Social Determinants of Health   Financial Resource Strain: Not on file  Food Insecurity: Not on file  Transportation Needs: Not on file  Physical Activity: Not on file  Stress: Not on file  Social Connections: Not on file    Allergies:  Allergies  Allergen Reactions   Penicillins Other (See Comments)    Unsure of reaction, had allergic reaction  as a child and has not taken since then    Metabolic Disorder Labs: Lab Results  Component Value Date   HGBA1C 5.6 01/02/2022   No results found for: "PROLACTIN" Lab Results  Component Value Date   CHOL 182 01/02/2022   TRIG 136 01/02/2022   HDL 50 01/02/2022   CHOLHDL 3.6 01/02/2022   LDLCALC 108 (H) 01/02/2022   LDLCALC 95 09/17/2021   Lab Results  Component Value Date   TSH 1.250 10/23/2020    Therapeutic Level Labs: No results found for: "LITHIUM" No results found for: "VALPROATE" No results found for: "CBMZ"  Current Medications: Current Outpatient Medications  Medication Sig Dispense Refill   busPIRone (BUSPAR) 15 MG tablet Take 1 tablet (15 mg total) by mouth 3 (three) times daily. 90 tablet 3   lisinopril (ZESTRIL) 5 MG tablet Take 1 tablet (5 mg total) by mouth daily. To lower blood pressure 90 tablet 1   metoprolol succinate (TOPROL-XL) 50 MG 24 hr tablet Take 1 tablet (50 mg total) by mouth daily. Take with or immediately following a meal. 90 tablet 1   OLANZapine (ZYPREXA) 20 MG tablet Take 1 tablet (20 mg total) by mouth at bedtime. 90 tablet 3   prazosin (MINIPRESS) 1 MG capsule Take 1 capsule (1 mg total) by mouth at bedtime. 90 capsule 3   rosuvastatin (CRESTOR) 20 MG tablet Take 1 tablet (20 mg total) by mouth  daily. (Patient taking differently: Take 20 mg by mouth every 3 (three) days.) 90 tablet 3   No current facility-administered medications for this visit.     Musculoskeletal: Strength & Muscle Tone:  Unable to assess due to telephone visit Gait & Station:  Unable to assess due to telephone visit Patient leans: N/A  Psychiatric Specialty Exam: Review of Systems  There were no vitals taken for this visit.There is no height or weight on file to calculate BMI.  General Appearance:  Unable to assess due to telephone visit  Eye Contact:   Unable to assess due to telephone visit  Speech:  Clear and Coherent and Normal Rate  Volume:  Normal  Mood:  Euthymic  Affect:  Appropriate and Congruent  Thought Process:  Coherent, Goal Directed, and Linear  Orientation:  Full (Time, Place, and Person)  Thought Content: Delusions and Hallucinations: Auditory   Suicidal Thoughts:  No  Homicidal Thoughts:  No  Memory:  Immediate;   Good Recent;   Good Remote;   Good  Judgement:  Good  Insight:  Good  Psychomotor Activity:   Unable to assess due to telephone visit  Concentration:  Concentration: Good and Attention Span: Good  Recall:  Good  Fund of Knowledge: Good  Language: Good  Akathisia:   Unable to assess due to telephone visit  Handed:  Right  AIMS (if indicated): Not done  Assets:  Communication Skills Desire for Improvement Financial Resources/Insurance Housing Leisure Time  ADL's:  Intact  Cognition: WNL  Sleep:  Good   Screenings: GAD-7    Flowsheet Row Office Visit from 01/02/2022 in [facility] Office Visit from 12/12/2021 in [facility] Office Visit from 10/01/2021 in [facility] Office Visit from 09/08/2021 in [facility] Office Visit from 07/03/2021 in [facility]  Total GAD-7 Score 1 3 0 3 3      PHQ2-9    Flowsheet Row Office  Visit from 01/02/2022 in [facility] Office Visit from 12/12/2021 in [facility] Office Visit from 10/01/2021 in [place] Office Visit from 09/08/2021 in [facility] Office Visit from 07/03/2021 in [place]  PHQ-2 Total Score 0 2 0 0 0  PHQ-9 Total Score 2 4 1 3  --      Flowsheet Row Office Visit from 12/12/2021 in [place] from 12/12/2020 in [place] from 09/17/2020 in [facility] No Risk No Risk No Risk        Assessment and Plan: Patient endorses auditory hallucination and delusional thinking.  He reports that he finds his medications effective and requests that they not be adjusted.  Patient agreeable to continue medication as prescribed.  1. Schizophrenia, unspecified type ([place])  Continue- busPIRone (BUSPAR) 15 MG tablet; Take 1 tablet (15 mg total) by mouth daily.  Dispense: 90 tablet; Refill: 3 Continue- OLANZapine (ZYPREXA) 20 MG tablet; Take 1 tablet (20 mg total) by mouth at bedtime.  Dispense: 90 tablet; Refill: 3  2. Benign prostatic hyperplasia, unspecified whether lower urinary tract symptoms present  Continue- prazosin (MINIPRESS) 1 MG capsule; Take 1 capsule (1 mg total) by mouth at bedtime.  Dispense: 90 capsule; Refill: 3  Follow-up in 3 months  Salley Slaughter, NP 03/10/2022, 2:56 PM

## 2022-03-16 NOTE — Progress Notes (Unsigned)
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 you have an alcohol dependency problem, decide to stop drinking. This can be very hard to do if you are used to frequently drinking alcohol. If you begin to have withdrawal symptoms, talk with your health care provider or a person that you trust. These symptoms may include anxiety, shaky hands, headache, nausea, sweating, or not being able to sleep. Choose to drink nonalcoholic beverages in social gatherings and places where there may be alcohol. Activity Spend more time on activities that you enjoy that do not involve alcohol, like hobbies or exercise. Find healthy ways to cope with stress, such as meditation or spending time with people you care about. General information Talk to your family, coworkers, and friends about supporting you in your efforts to stop drinking. If they drink, ask them not to drink around you. Spend more time with people who do not  drink alcohol. If you think that you have an alcohol dependency problem: Tell friends or family about your concerns. Talk with your health care provider or another health professional about where to get help. Work with a Transport planner and a Regulatory affairs officer. Consider joining a support group for people who struggle with alcohol misuse and dependence. Where to find support  Your health care provider. SMART Recovery: smartrecovery.org Local treatment centers or chemical dependency counselors. Local AA groups in your community: ShedSizes.ch Where to find more information Centers for Disease Control and Prevention: StoreMirror.com.cy [employer] on Alcohol Abuse and Alcoholism: LinkVoyage.dk Alcoholics Anonymous (AA): ShedSizes.ch Contact a health care provider if: You drank more or for longer than you intended on more than one occasion. You often drink to the point of vomiting or passing out. You have problems in your life due to drinking, but you continue to drink. You keep drinking even though you feel anxious, depressed, or have experienced memory loss. You have stopped doing the things you used to enjoy in order to drink. You have to drink more than you used to in order to get the effect you want. You experience anxiety, sweating, nausea, shakiness, and trouble sleeping when you try to stop drinking. Get help right away if: You have serious withdrawal symptoms, including: Confusion. Racing heart. High blood pressure. Fever. These symptoms may be an emergency. Get help right away. Call 911. Do not wait to see if the symptoms will go away. Do not drive yourself to the hospital. Also, get help right away if: You have thoughts about hurting yourself or others. Take one of these steps if you feel like you may hurt yourself or others, or have thoughts about taking your own  life: Call 911. Call the [place] at [phone] or 988. This is open 24 hours a  day. Text the Crisis Text Line at 6301940584. Summary Alcohol misuse and dependence can have a negative effect on your life. Drinking too much or too often can lead to addiction. If you drink alcohol, limit how much you use. If you are having trouble keeping your drinking under control, find ways to change your behavior. Hobbies, calming activities, exercise, or support groups can help. If you feel you need help with changing your drinking habits, talk with your health care provider, a good friend, or a therapist, or go to a support group. This information is not intended to replace advice given to you by your health care provider. Make sure you discuss any questions you have with your health care provider. Document Revised: 09/24/2021 Document Reviewed: 09/24/2021 Elsevier Patient Education  [place]         SignedFinis Bud, NP  03/17/2022 9:52 PM    [place]

## 2022-03-17 ENCOUNTER — Encounter: Payer: Self-pay | Admitting: Nurse Practitioner

## 2022-03-17 ENCOUNTER — Ambulatory Visit (INDEPENDENT_AMBULATORY_CARE_PROVIDER_SITE_OTHER): Payer: Medicare Other | Admitting: Nurse Practitioner

## 2022-03-17 VITALS — BP 113/60 | HR 98 | Ht 74.0 in | Wt 196.8 lb

## 2022-03-17 DIAGNOSIS — Z59819 Housing instability, housed unspecified: Secondary | ICD-10-CM

## 2022-03-17 DIAGNOSIS — F1910 Other psychoactive substance abuse, uncomplicated: Secondary | ICD-10-CM

## 2022-03-17 DIAGNOSIS — E782 Mixed hyperlipidemia: Secondary | ICD-10-CM

## 2022-03-17 DIAGNOSIS — Z789 Other specified health status: Secondary | ICD-10-CM | POA: Insufficient documentation

## 2022-03-17 DIAGNOSIS — I700 Atherosclerosis of aorta: Secondary | ICD-10-CM

## 2022-03-17 DIAGNOSIS — Z5986 Financial insecurity: Secondary | ICD-10-CM

## 2022-03-17 DIAGNOSIS — I10 Essential (primary) hypertension: Secondary | ICD-10-CM

## 2022-03-17 DIAGNOSIS — F1290 Cannabis use, unspecified, uncomplicated: Secondary | ICD-10-CM | POA: Insufficient documentation

## 2022-03-17 MED ORDER — ROSUVASTATIN CALCIUM 40 MG PO TABS
40.0000 mg | ORAL_TABLET | Freq: Every day | ORAL | 3 refills | Status: DC
Start: 2022-03-17 — End: 2022-12-23

## 2022-03-17 NOTE — Patient Instructions (Addendum)
Medication Instructions:  Your physician has recommended you make the following change in your medication:   INCREASE Crestor (Rosuvastatin) to 40 mg taking 1 daily    *If you need a refill on your cardiac medications before your next appointment, please call your pharmacy*   Lab Work: 2 MONTHS:  FASTING LIPID & LFT  If you have labs (blood work) drawn today and your tests are completely normal, you will receive your results only by: MyChart Message (if you have MyChart) OR A paper copy in the mail If you have any lab test that is abnormal or we need to change your treatment, we will call you to review the results.   Testing/Procedures: None ordered   Follow-Up: At [facility], you and your health needs are our priority.  As part of our continuing mission to provide you with exceptional heart care, we have created designated Provider Care Teams.  These Care Teams include your primary Cardiologist (physician) and Advanced Practice Providers (APPs -  Physician Assistants and Nurse Practitioners) who all work together to provide you with the care you need, when you need it.  We recommend signing up for the patient portal called "MyChart".  Sign up information is provided on this After Visit Summary.  MyChart is used to connect with patients for Virtual Visits (Telemedicine).  Patients are able to view lab/test results, encounter notes, upcoming appointments, etc.  Non-urgent messages can be sent to your provider as well.   To learn more about what you can do with MyChart, go to [email or web address].    Your next appointment:   09/29/22 arrive at 1:00  The format for your next appointment:   In Person  Provider:   Christell Constant, MD     Other Instructions Your physician has requested that you regularly monitor and record your blood pressure readings at home. Please use the same machine at the same time of day to check your readings and record them to bring to your  follow-up visit.   Please monitor blood pressures and keep a log of your readings. If your top # is below 100 and you are having symptoms, please call the office.   Make sure to check 2 hours after your medications.    AVOID these things for 30 minutes before checking your blood pressure: No Drinking caffeine. No Drinking alcohol. No Eating. No Smoking. No Exercising.   Five minutes before checking your blood pressure: Pee. Sit in a dining chair. Avoid sitting in a soft couch or armchair. Be quiet. Do not talk   Steps to Quit Smoking Smoking tobacco is the leading cause of preventable death. It can affect almost every organ in the body. Smoking puts you and people around you at risk for many serious, long-lasting (chronic) diseases. Quitting smoking can be hard, but it is one of the best things that you can do for your health. It is never too late to quit. Do not give up if you cannot quit the first time. Some people need to try many times to quit. Do your best to stick to your quit plan, and talk with your doctor if you have any questions or concerns. How do I get ready to quit? Pick a date to quit. Set a date within the next 2 weeks to give you time to prepare. Write down the reasons why you are quitting. Keep this list in places where you will see it often. Tell your family, friends, and co-workers that you are quitting.  Their support is important. Talk with your doctor about the choices that may help you quit. Find out if your health insurance will pay for these treatments. Know the people, places, things, and activities that make you want to smoke (triggers). Avoid them. What first steps can I take to quit smoking? Throw away all cigarettes at home, at work, and in your car. Throw away the things that you use when you smoke, such as ashtrays and lighters. Clean your car. Empty the ashtray. Clean your home, including curtains and carpets. What can I do to help me quit  smoking? Talk with your doctor about taking medicines and seeing a counselor. You are more likely to succeed when you do both. If you are pregnant or breastfeeding: Talk with your doctor about counseling or other ways to quit smoking. Do not take medicine to help you quit smoking unless your doctor tells you to. Quit right away Quit smoking completely, instead of slowly cutting back on how much you smoke over a period of time. Stopping smoking right away may be more successful than slowly quitting. Go to counseling. In-person is best if this is an option. You are more likely to quit if you go to counseling sessions regularly. Take medicine You may take medicines to help you quit. Some medicines need a prescription, and some you can buy over-the-counter. Some medicines may contain a drug called nicotine to replace the nicotine in cigarettes. Medicines may: Help you stop having the desire to smoke (cravings). Help to stop the problems that come when you stop smoking (withdrawal symptoms). Your doctor may ask you to use: Nicotine patches, gum, or lozenges. Nicotine inhalers or sprays. Non-nicotine medicine that you take by mouth. Find resources Find resources and other ways to help you quit smoking and remain smoke-free after you quit. They include: Online chats with a Social worker. Phone quitlines. Printed Furniture conservator/restorer. Support groups or group counseling. Text messaging programs. Mobile phone apps. Use apps on your mobile phone or tablet that can help you stick to your quit plan. Examples of free services include Quit Guide from the CDC and smokefree.gov  What can I do to make it easier to quit?  Talk to your family and friends. Ask them to support and encourage you. Call a phone quitline, such as 1-800-QUIT-NOW, reach out to support groups, or work with a Social worker. Ask people who smoke to not smoke around you. Avoid places that make you want to smoke, such  as: Bars. Parties. Smoke-break areas at work. Spend time with people who do not smoke. Lower the stress in your life. Stress can make you want to smoke. Try these things to lower stress: Getting regular exercise. Doing deep-breathing exercises. Doing yoga. Meditating. What benefits will I see if I quit smoking? Over time, you may have: A better sense of smell and taste. Less coughing and sore throat. A slower heart rate. Lower blood pressure. Clearer skin. Better breathing. Fewer sick days. Summary Quitting smoking can be hard, but it is one of the best things that you can do for your health. Do not give up if you cannot quit the first time. Some people need to try many times to quit. When you decide to quit smoking, make a plan to help you succeed. Quit smoking right away, not slowly over a period of time. When you start quitting, get help and support to keep you smoke-free. This information is not intended to replace advice given to you by your health care  provider. Make sure you discuss any questions you have with your health care provider. Document Revised: 07/11/2021 Document Reviewed: 07/11/2021 Elsevier Patient Education  2023 Elsevier Inc.  Alcohol Misuse and Dependence Information, Adult Alcohol is a widely available drug and people choose to drink alcohol in different amounts. Alcohol misuse and dependence can have a negative effect on your life. Alcohol misuse is when you use alcohol too much or too often. You may have a hard time setting a limit on the amount you drink. Alcohol dependence is when you use alcohol consistently for a period of time, and your body changes as a result. Alcohol dependence can make it hard for you to stop drinking because you may start to feel sick or different when you do not drink alcohol. These symptoms are known as withdrawal. People who drink alcohol very often and in large amounts, may develop what is called an alcohol use disorder. How  can alcohol misuse and dependence affect me? Drinking too much can lead to addiction. You may feel like you need alcohol to function normally. You may drink alcohol before work in the morning, during the day, or as soon as you get home from work in the evening. These actions can result in: Poor work performance. Job loss. Financial problems. Car crashes or criminal charges from driving after drinking alcohol. Problems in your relationships with friends and family. Losing the trust and respect of coworkers, friends, and family. Drinking heavily over a long period of time can permanently damage your body and brain, and can cause lifelong health issues, such as: Damage to your liver or pancreas. Heart problems, high blood pressure, or stroke. Certain cancers. Decreased ability to fight infections. Brain or nerve damage. Depression. Early death, also called premature death. If you are careless or you crave alcohol, it is easy to drink more than your body can handle (overdose). Alcohol overdose is a serious situation that requires hospitalization. It may lead to permanent injuries or death. What can increase my risk? Having a family history of alcohol misuse. Having depression or other mental health conditions. Beginning to drink at an early age. Binge drinking often. Experiencing trauma, stress, and an unstable home life during childhood. Spending time with people who drink often. What actions can I take to prevent alcohol misuse and dependence? Do not drink alcohol if: Your health care provider tells you not to drink. You are pregnant, may be pregnant, or are planning to become pregnant. If you drink alcohol: Limit how much you have to: 0-1 drink a day for women who are not pregnant. 0-2 drinks a day for men. Know how much alcohol is in your drink. In the [place] one drink equals one 12 oz bottle of beer (355 mL), one 5 oz glass of wine (148 mL), or one 1 oz glass of hard liquor (44  mL). If you think you have an alcohol dependency problem, decide to stop drinking. This can be very hard to do if you are used to frequently drinking alcohol. If you begin to have withdrawal symptoms, talk with your health care provider or a person that you trust. These symptoms may include anxiety, shaky hands, headache, nausea, sweating, or not being able to sleep. Choose to drink nonalcoholic beverages in social gatherings and places where there may be alcohol. Activity Spend more time on activities that you enjoy that do not involve alcohol, like hobbies or exercise. Find healthy ways to cope with stress, such as meditation or spending time with people  you care about. General information Talk to your family, coworkers, and friends about supporting you in your efforts to stop drinking. If they drink, ask them not to drink around you. Spend more time with people who do not drink alcohol. If you think that you have an alcohol dependency problem: Tell friends or family about your concerns. Talk with your health care provider or another health professional about where to get help. Work with a Paramedic and a Network engineer. Consider joining a support group for people who struggle with alcohol misuse and dependence. Where to find support  Your health care provider. SMART Recovery: smartrecovery.org Local treatment centers or chemical dependency counselors. Local AA groups in your community: CustomizedRugs.fi Where to find more information Centers for Disease Control and Prevention: TonerPromos.no [employer] on Alcohol Abuse and Alcoholism: BackupSupply.hu Alcoholics Anonymous (AA): CustomizedRugs.fi Contact a health care provider if: You drank more or for longer than you intended on more than one occasion. You often drink to the point of vomiting or passing out. You have problems in your life due to drinking, but you continue to drink. You keep drinking even though you feel anxious, depressed, or  have experienced memory loss. You have stopped doing the things you used to enjoy in order to drink. You have to drink more than you used to in order to get the effect you want. You experience anxiety, sweating, nausea, shakiness, and trouble sleeping when you try to stop drinking. Get help right away if: You have serious withdrawal symptoms, including: Confusion. Racing heart. High blood pressure. Fever. These symptoms may be an emergency. Get help right away. Call 911. Do not wait to see if the symptoms will go away. Do not drive yourself to the hospital. Also, get help right away if: You have thoughts about hurting yourself or others. Take one of these steps if you feel like you may hurt yourself or others, or have thoughts about taking your own life: Call 911. Call the National Suicide Prevention Lifeline at 6263995570 or 988. This is open 24 hours a day. Text the Crisis Text Line at 629-396-6869. Summary Alcohol misuse and dependence can have a negative effect on your life. Drinking too much or too often can lead to addiction. If you drink alcohol, limit how much you use. If you are having trouble keeping your drinking under control, find ways to change your behavior. Hobbies, calming activities, exercise, or support groups can help. If you feel you need help with changing your drinking habits, talk with your health care provider, a good friend, or a therapist, or go to a support group. This information is not intended to replace advice given to you by your health care provider. Make sure you discuss any questions you have with your health care provider. Document Revised: 09/24/2021 Document Reviewed: 09/24/2021 Elsevier Patient Education  2023 Elsevier Inc.  Important Information About Sugar

## 2022-03-18 ENCOUNTER — Telehealth: Payer: Self-pay | Admitting: Licensed Clinical Social Worker
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
## 2022-03-20 ENCOUNTER — Ambulatory Visit: Payer: Medicare Other | Attending: Nurse Practitioner

## 2022-03-20 DIAGNOSIS — Z0000 Encounter for general adult medical examination without abnormal findings: Secondary | ICD-10-CM | POA: Diagnosis not present

## 2022-03-20 NOTE — Patient Instructions (Addendum)
Please remember to bring your cologuard kit with you to your upcoming appointment. Your Flu vaccine will be given at your upcoming appointment on 04/15/2022 You declined your Shingles vaccine today.  [name] , Thank you for taking time to come for your Medicare Wellness Visit. I appreciate your ongoing commitment to your health goals. Please review the following plan we discussed and let me know if I can assist you in the future.   These are the goals we discussed:  Goals   None     This is a list of the screening recommended for you and due dates:  Health Maintenance  Topic Date Due   Stool Blood Test  Never done   Pneumonia Vaccine (2 - PCV) 11/02/2014   COVID-19 Vaccine (3 - Moderna risk series) 08/17/2020   Flu Shot  03/03/2022   Colon Cancer Screening  04/01/2022*   Zoster (Shingles) Vaccine (1 of 2) 04/04/2022*   Tetanus Vaccine  08/30/2023   Hepatitis C Screening: USPSTF Recommendation to screen - Ages 18-79 yo.  Completed   HPV Vaccine  Aged Out  *Topic was postponed. The date shown is not the original due date.  Health Maintenance After Age 62 After age 25, you are at a higher risk for certain long-term diseases and infections as well as injuries from falls. Falls are a major cause of broken bones and head injuries in people who are older than age 48. Getting regular preventive care can help to keep you healthy and well. Preventive care includes getting regular testing and making lifestyle changes as recommended by your health care provider. Talk with your health care provider about: Which screenings and tests you should have. A screening is a test that checks for a disease when you have no symptoms. A diet and exercise plan that is right for you. What should I know about screenings and tests to prevent falls? Screening and testing are the best ways to find a health problem early. Early diagnosis and treatment give you the best chance of managing medical conditions that are  common after age 90. Certain conditions and lifestyle choices may make you more likely to have a fall. Your health care provider may recommend: Regular vision checks. Poor vision and conditions such as cataracts can make you more likely to have a fall. If you wear glasses, make sure to get your prescription updated if your vision changes. Medicine review. Work with your health care provider to regularly review all of the medicines you are taking, including over-the-counter medicines. Ask your health care provider about any side effects that may make you more likely to have a fall. Tell your health care provider if any medicines that you take make you feel dizzy or sleepy. Strength and balance checks. Your health care provider may recommend certain tests to check your strength and balance while standing, walking, or changing positions. Foot health exam. Foot pain and numbness, as well as not wearing proper footwear, can make you more likely to have a fall. Screenings, including: Osteoporosis screening. Osteoporosis is a condition that causes the bones to get weaker and break more easily. Blood pressure screening. Blood pressure changes and medicines to control blood pressure can make you feel dizzy. Depression screening. You may be more likely to have a fall if you have a fear of falling, feel depressed, or feel unable to do activities that you used to do. Alcohol use screening. Using too much alcohol can affect your balance and may make you more  likely to have a fall. Follow these instructions at home: Lifestyle Do not drink alcohol if: Your health care provider tells you not to drink. If you drink alcohol: Limit how much you have to: 0-1 drink a day for women. 0-2 drinks a day for men. Know how much alcohol is in your drink. In the [place] one drink equals one 12 oz bottle of beer (355 mL), one 5 oz glass of wine (148 mL), or one 1 oz glass of hard liquor (44 mL). Do not use any products that  contain nicotine or tobacco. These products include cigarettes, chewing tobacco, and vaping devices, such as e-cigarettes. If you need help quitting, ask your health care provider. Activity  Follow a regular exercise program to stay fit. This will help you maintain your balance. Ask your health care provider what types of exercise are appropriate for you. If you need a cane or walker, use it as recommended by your health care provider. Wear supportive shoes that have nonskid soles. Safety  Remove any tripping hazards, such as rugs, cords, and clutter. Install safety equipment such as grab bars in bathrooms and safety rails on stairs. Keep rooms and walkways well-lit. General instructions Talk with your health care provider about your risks for falling. Tell your health care provider if: You fall. Be sure to tell your health care provider about all falls, even ones that seem minor. You feel dizzy, tiredness (fatigue), or off-balance. Take over-the-counter and prescription medicines only as told by your health care provider. These include supplements. Eat a healthy diet and maintain a healthy weight. A healthy diet includes low-fat dairy products, low-fat (lean) meats, and fiber from whole grains, beans, and lots of fruits and vegetables. Stay current with your vaccines. Schedule regular health, dental, and eye exams. Summary Having a healthy lifestyle and getting preventive care can help to protect your health and wellness after age 42. Screening and testing are the best way to find a health problem early and help you avoid having a fall. Early diagnosis and treatment give you the best chance for managing medical conditions that are more common for people who are older than age 54. Falls are a major cause of broken bones and head injuries in people who are older than age 71. Take precautions to prevent a fall at home. Work with your health care provider to learn what changes you can make to  improve your health and wellness and to prevent falls. This information is not intended to replace advice given to you by your health care provider. Make sure you discuss any questions you have with your health care provider. Document Revised: 12/09/2020 Document Reviewed: 12/09/2020 Elsevier Patient Education  [place] for Falls Each year, millions of people have serious injuries from falls. It is important to understand your risk for falling. Talk with your health care provider about your risk and what you can do to lower it. There are actions you can take at home to lower your risk and prevent falls. If you do have a serious fall, make sure to tell your health care provider. Falling once raises your risk of falling again. How can falls affect me? Serious injuries from falls are common. These include: Broken bones, such as hip fractures. Head injuries, such as traumatic brain injuries (TBI) or concussion. A fear of falling can cause you to avoid activities and stay at home. This can make your muscles weaker and actually raise your risk for a  fall. What can increase my risk? There are a number of risk factors that increase your risk for falling. The more risk factors you have, the higher your risk of falling. Serious injuries from a fall happen most often to people older than age 38. Children and young adults ages 38-29 are also at higher risk. Common risk factors include: Weakness in the lower body. Lack (deficiency) of vitamin D. Being generally weak or confused due to long-term (chronic) illness. Dizziness or balance problems. Poor vision. Medicines that cause dizziness or drowsiness. These can include medicines for your blood pressure, heart, anxiety, insomnia, or edema, as well as pain medicines and muscle relaxants. Other risk factors include: Drinking alcohol. Having had a fall in the past. Having depression. Having foot pain or wearing improper  footwear. Working at a dangerous job. Having any of the following in your home: Tripping hazards, such as floor clutter or loose rugs. Poor lighting. Pets. Having dementia or memory loss. What actions can I take to lower my risk of falling?     Physical activity Maintain physical fitness. Do strength and balance exercises. Consider taking a regular class to build strength and balance. Yoga and tai chi are good options. Vision Have your eyes checked every year and your vision prescription updated as needed. Walking aids and footwear Wear nonskid shoes. Do not wear high heels. Do not walk around the house in socks or slippers. Use a cane or walker as told by your health care provider. Home safety Attach secure railings on both sides of your stairs. Install grab bars for your tub, shower, and toilet. Use a bath mat in your tub or shower. Use good lighting in all rooms. Keep a flashlight near your bed. Make sure there is a clear path from your bed to the bathroom. Use night-lights. Do not use throw rugs. Make sure all carpeting is taped or tacked down securely. Remove all clutter from walkways and stairways, including extension cords. Repair uneven or broken steps. Avoid walking on icy or slippery surfaces. Walk on the grass instead of on icy or slick sidewalks. Use ice melt to get rid of ice on walkways. Use a cordless phone. Questions to ask your health care provider Can you help me check my risk for a fall? Do any of my medicines make me more likely to fall? Should I take a vitamin D supplement? What exercises can I do to improve my strength and balance? Should I make an appointment to have my vision checked? Do I need a bone density test to check for weak bones or osteoporosis? Would it help to use a cane or a walker? Where to find more information Centers for Disease Control and Prevention, STEADI: http://www.wolf.info/ Community-Based Fall Prevention Programs: http://www.wolf.info/ National  Institute on Aging: http://kim-miller.com/ Contact a health care provider if: You fall at home. You are afraid of falling at home. You feel weak, drowsy, or dizzy. Summary Serious injuries from a fall happen most often to people older than age 49. Children and young adults ages 34-29 are also at higher risk. Talk with your health care provider about your risks for falling and how to lower those risks. Taking certain precautions at home can lower your risk for falling. If you fall, always tell your health care provider. This information is not intended to replace advice given to you by your health care provider. Make sure you discuss any questions you have with your health care provider. Document Revised: 02/21/2020 Document Reviewed: 02/21/2020 Elsevier  Patient Education  [place].

## 2022-03-20 NOTE — Progress Notes (Signed)
Subjective:   [name] is a 71 y.o. male who presents for Medicare Annual/Subsequent preventive examination.  Review of Systems     I connected with [name] on 03/20/2022 at 9:30 am by telephone and verified that I am speaking with the correct person using two identifiers. I discussed the limitations, risks, security and privacy concerns of performing an evaluation and management service by telephone and the availability of in person appointments. I also discussed with the patient that there may be a patient responsible charge related to this service. The patient expressed understanding and agreed to proceed.   Patient location: Home   My Location: [place] on the telephone call: Myself and patient      Cardiac Risk Factors include: none     Objective:    There were no vitals filed for this visit. There is no height or weight on file to calculate BMI.     03/20/2022    9:37 AM 04/19/2020    8:59 AM 03/17/2020    6:56 PM 05/09/2014    1:52 PM 10/31/2013    3:42 PM 09/19/2013    3:59 PM 08/30/2013    1:26 AM  Advanced Directives  Does Patient Have a Medical Advance Directive? No No No No Patient does not have advance directive;Patient would not like information Patient does not have advance directive;Patient would not like information Patient does not have advance directive;Patient would not like information  Would patient like information on creating a medical advance directive? Yes (ED - Information included in AVS)        Pre-existing out of facility DNR order (yellow form or pink MOST form)     No No No    Current Medications (verified) Outpatient Encounter Medications as of 03/20/2022  Medication Sig   busPIRone (BUSPAR) 15 MG tablet Take 1 tablet (15 mg total) by mouth 3 (three) times daily.   lisinopril (ZESTRIL) 5 MG tablet Take 1 tablet (5 mg total) by mouth daily. To lower blood pressure   metoprolol succinate (TOPROL-XL) 50 MG 24 hr  tablet Take 1 tablet (50 mg total) by mouth daily. Take with or immediately following a meal.   OLANZapine (ZYPREXA) 20 MG tablet Take 1 tablet (20 mg total) by mouth at bedtime.   prazosin (MINIPRESS) 1 MG capsule Take 1 capsule (1 mg total) by mouth at bedtime.   rosuvastatin (CRESTOR) 40 MG tablet Take 1 tablet (40 mg total) by mouth daily.   No facility-administered encounter medications on file as of 03/20/2022.    Allergies (verified) Penicillins   History: Past Medical History:  Diagnosis Date   Acute urinary retention    Anxiety    Brain injury ([place]) 8182   Complication of anesthesia 08/29/13   difficulty due to large tongue, anterior larynx and limited opening   Difficult intubation 08/31/13   Head injury 1975   "Brain Stem Contusion"   Manic depression ([place])    Pelvic fracture ([place]) 09/21/13   Inferior Pubic Ramus, Left Superior Ramus   Schizo affective schizophrenia ([place])    Schizophrenia ([place])    Past Surgical History:  Procedure Laterality Date   COSMETIC SURGERY Left 1975   created new ear lobe   DRESSING CHANGE UNDER ANESTHESIA Left 08/31/2013   Procedure: DRESSING CHANGE UNDER ANESTHESIA for right ankle and left thigh;  Surgeon: Rozanna Box, MD;  Location: [facility];  Service: Orthopedics;  Laterality: Left;   I & D EXTREMITY Bilateral 08/30/2013  Procedure: IRRIGATION AND DEBRIDEMENT with closure Left Thigh wound, Irrigation and debridement Right Ankle ;  Surgeon: Alta Corning, MD;  Location: [facility];  Service: Orthopedics;  Laterality: Bilateral;   I & D EXTREMITY Left 09/26/2013   Procedure: LEFT IRRIGATION AND DEBRIDEMENT Retta Diones;  Surgeon: Rozanna Box, MD;  Location: [facility];  Service: Orthopedics;  Laterality: Left;   I & D EXTREMITY Left 10/02/2013   Procedure: IRRIGATION AND DEBRIDEMENT EXTREMITY;  Surgeon: Rozanna Box, MD;  Location: [facility];  Service: Orthopedics;  Laterality: Left;   INCISION AND DRAINAGE OF WOUND Left 09/21/2013   Procedure:  IRRIGATION AND DEBRIDEMENT SOFT TISSUE WOUND WITH LARGE WOUND VAC PLACEMENT;  Surgeon: Rozanna Box, MD;  Location: [facility];  Service: Orthopedics;  Laterality: Left;   MANDIBLE FRACTURE SURGERY  1975   SACRO-ILIAC PINNING Left 08/31/2013   Procedure: LEFT SACRO-ILIAC PINNING;  Surgeon: Rozanna Box, MD;  Location: [facility];  Service: Orthopedics;  Laterality: Left;   SKIN GRAFT     SKIN GRAFT Left 10/31/2013   left thigh      DR HANDY    SKIN SPLIT GRAFT Left 10/02/2013   Procedure: SKIN GRAFT SPLIT THICKNESS;  Surgeon: Rozanna Box, MD;  Location: [facility];  Service: Orthopedics;  Laterality: Left;   SKIN SPLIT GRAFT Left 10/31/2013   Procedure: LEFT THIGH SKIN GRAFT SPLIT THICKNESS;  Surgeon: Rozanna Box, MD;  Location: [facility];  Service: Orthopedics;  Laterality: Left;   Family History  Problem Relation Age of Onset   Diabetes Mother    Obsessive Compulsive Disorder Father    Bone cancer Father    Colon cancer Neg Hx    Rectal cancer Neg Hx    Liver cancer Neg Hx    Pancreatic cancer Neg Hx    Social History   Socioeconomic History   Marital status: Single    Spouse name: Not on file   Number of children: Not on file   Years of education: Not on file   Highest education level: Not on file  Occupational History   Not on file  Tobacco Use   Smoking status: Every Day    Packs/day: 1.50    Types: Cigarettes    Last attempt to quit: 08/29/2013    Years since quitting: 8.5   Smokeless tobacco: Never  Vaping Use   Vaping Use: Never used  Substance and Sexual Activity   Alcohol use: Yes    Alcohol/week: 6.0 standard drinks of alcohol    Types: 6 Cans of beer per week    Comment: weekly   Drug use: No   Sexual activity: Never    Comment: Quit smolikg 08/29/2013  Other Topics Concern   Not on file  Social History Narrative   Not on file   Social Determinants of Health   Financial Resource Strain: Medium Risk (03/18/2022)   Overall Financial Resource Strain (CARDIA)     Difficulty of Paying Living Expenses: Somewhat hard  Food Insecurity: No Food Insecurity (03/18/2022)   Hunger Vital Sign    Worried About Running Out of Food in the Last Year: Never true    Ran Out of Food in the Last Year: Never true  Transportation Needs: No Transportation Needs (03/18/2022)   PRAPARE - Hydrologist (Medical): No    Lack of Transportation (Non-Medical): No  Physical Activity: Sufficiently Active (03/20/2022)   Exercise Vital Sign    Days of Exercise per  Week: 7 days    Minutes of Exercise per Session: 30 min  Stress: No Stress Concern Present (03/20/2022)   [place]    Feeling of Stress : Not at all  Social Connections: Socially Isolated (03/20/2022)   Social Connection and Isolation Panel [NHANES]    Frequency of Communication with Friends and Family: More than three times a week    Frequency of Social Gatherings with Friends and Family: More than three times a week    Attends Religious Services: Never    Marine scientist or Organizations: No    Attends Music therapist: Never    Marital Status: Never married    Tobacco Counseling Ready to quit: Not Answered Counseling given: Not Answered   Clinical Intake:  Pre-visit preparation completed: No  Pain : No/denies pain     Diabetes: No     Diabetic?No  Interpreter Needed?: No      Activities of Daily Living    03/20/2022    9:38 AM  In your present state of health, do you have any difficulty performing the following activities:  Hearing? 0  Vision? 1  Difficulty concentrating or making decisions? 0  Walking or climbing stairs? 0  Dressing or bathing? 0  Doing errands, shopping? 0  Preparing Food and eating ? N  Using the Toilet? N  In the past six months, have you accidently leaked urine? N  Do you have problems with loss of bowel control? N  Managing your Medications? N   Managing your Finances? N  Housekeeping or managing your Housekeeping? N    Patient Care Team: Gildardo Pounds, NP as PCP - General (Nurse Practitioner) Werner Lean, MD as PCP - Cardiology (Cardiology)  Indicate any recent Medical Services you may have received from other than [facility] providers in the past year (date may be approximate).     Assessment:   This is a routine wellness examination for [place].  Hearing/Vision screen No results found.  Dietary issues and exercise activities discussed: Current Exercise Habits: Home exercise routine, Type of exercise: walking, Time (Minutes): 30, Frequency (Times/Week): 7, Weekly Exercise (Minutes/Week): 210, Intensity: Mild, Exercise limited by: None identified   Goals Addressed   None    Depression Screen    03/20/2022    9:38 AM 01/02/2022    3:25 PM 12/12/2021    3:46 PM 10/01/2021    4:02 PM 09/08/2021    1:42 PM 07/03/2021    2:50 PM 04/01/2021    3:53 PM  PHQ 2/9 Scores  PHQ - 2 Score 0 0  0  0 0  PHQ- 9 Score  2  1   0     Information is confidential and restricted. Go to Review Flowsheets to unlock data.    Fall Risk    03/20/2022    9:38 AM 01/02/2022    3:25 PM 10/01/2021    4:02 PM 07/03/2021    2:50 PM 04/01/2021    3:20 PM  Fall Risk   Falls in the past year? 0 0 0 0 0  Number falls in past yr: 0 0 0 0 0  Injury with Fall? 0 0 0  0  Risk for fall due to : No Fall Risks No Fall Risks No Fall Risks  No Fall Risks  Follow up    Falls evaluation completed     FALL RISK PREVENTION PERTAINING TO THE HOME:  Any stairs in  or around the home? No  If so, are there any without handrails? No  Home free of loose throw rugs in walkways, pet beds, electrical cords, etc? Yes  Adequate lighting in your home to reduce risk of falls? Yes   ASSISTIVE DEVICES UTILIZED TO PREVENT FALLS:  Life alert? No  Use of a cane, walker or w/c? No  Grab bars in the bathroom? No  Shower chair or bench in shower? No  Elevated  toilet seat or a handicapped toilet? No   TIMED UP AND GO:  Was the test performed? No .  Length of time to ambulate 10 feet: 0 sec.   Gait slow and steady without use of assistive device  Cognitive Function:    03/20/2022    9:39 AM  MMSE - Mini Mental State Exam  Not completed: Unable to complete        03/20/2022    9:40 AM  6CIT Screen  What Year? 0 points  What month? 0 points  What time? 0 points  Count back from 20 0 points  Months in reverse 4 points  Repeat phrase 0 points  Total Score 4 points    Immunizations Immunization History  Administered Date(s) Administered   Influenza,inj,Quad PF,6+ Mos 04/19/2020, 05/29/2021   Moderna SARS-COV2 Booster Vaccination 07/20/2020   Moderna Sars-Covid-2 Vaccination 09/22/2019, 10/20/2019   Pneumococcal Polysaccharide-23 11/01/2013   Tdap 08/29/2013    TDAP status: Up to date  Flu Vaccine will be given on upcoming appointment 04/15/2022  Pneumococcal vaccine status: Due, Education has been provided regarding the importance of this vaccine. Advised may receive this vaccine at local pharmacy or Health Dept. Aware to provide a copy of the vaccination record if obtained from local pharmacy or Health Dept. Verbalized acceptance and understanding.  Covid-19 vaccine status: Completed vaccines  Qualifies for Shingles Vaccine? Yes  Zostavax completed No   Pt Declined Shingles Vaccine  Screening Tests Health Maintenance  Topic Date Due   COLON CANCER SCREENING ANNUAL FOBT  Never done   Pneumonia Vaccine 10+ Years old (2 - PCV) 11/02/2014   COVID-19 Vaccine (3 - Moderna risk series) 08/17/2020   INFLUENZA VACCINE  03/03/2022   COLONOSCOPY (Pts 45-48yr Insurance coverage will need to be confirmed)  04/01/2022 (Originally 01/19/1997)   Zoster Vaccines- Shingrix (1 of 2) 04/04/2022 (Originally 01/20/1970)   TETANUS/TDAP  08/30/2023   Hepatitis C Screening  Completed   HPV VACCINES  Aged Out    Health Maintenance  Health  Maintenance Due  Topic Date Due   COLON CANCER SCREENING ANNUAL FOBT  Never done   Pneumonia Vaccine 70 Years old (2 - PCV) 11/02/2014   COVID-19 Vaccine (3 - Moderna risk series) 08/17/2020   INFLUENZA VACCINE  03/03/2022    Colorectal cancer screening: Referral to GI placed 01/02/2022, pt also has Cologuard kit . Pt aware the office will call re: appt.  Lung Cancer Screening: (Low Dose CT Chest recommended if Age 70-80years, 30 pack-year currently smoking OR have quit w/in 15years.) does qualify.   Lung Cancer Screening Referral: 10/01/2021 order placed  Additional Screening:  Hepatitis C Screening: does qualify; Completed 04/01/2021  Vision Screening: Recommended annual ophthalmology exams for early detection of glaucoma and other disorders of the eye. Is the patient up to date with their annual eye exam?  No  Who is the provider or what is the name of the office in which the patient attends annual eye exams? N/A If pt is not established with a provider,  would they like to be referred to a provider to establish care? Yes .   Dental Screening: Recommended annual dental exams for proper oral hygiene  Community Resource Referral / Chronic Care Management: CRR required this visit?  No   CCM required this visit?  No      Plan:     I have personally reviewed and noted the following in the patient's chart:   Medical and social history Use of alcohol, tobacco or illicit drugs  Current medications and supplements including opioid prescriptions. Patient is not currently taking opioid prescriptions. Functional ability and status Nutritional status Physical activity Advanced directives List of other physicians Hospitalizations, surgeries, and ER visits in previous 12 months Vitals Screenings to include cognitive, depression, and falls Referrals and appointments  In addition, I have reviewed and discussed with patient certain preventive protocols, quality metrics, and best  practice recommendations. A written personalized care plan for preventive services as well as general preventive health recommendations were provided to patient.     Gomez Cleverly, [place]   03/20/2022   Nurse Notes: I spent 30 minutes on this telephone encounter

## 2022-03-24 ENCOUNTER — Telehealth: Payer: Self-pay | Admitting: Licensed Clinical Social Worker

## 2022-03-24 NOTE — Telephone Encounter (Signed)
H&V Care Navigation CSW Progress Note  Clinical Social Worker contacted patient by phone to f/u on referrals made. I was able to reach [name] at [phone]. Re-introduced self, role, reason for call. [name] shares he has been making calls to different housing but hasnt had any success at this time- he shares he has a hard time with not having access to a computer. I discussed the email back from [facility] I received yesterday - "Unfortunately since he is staying at a hotel he would not meet criteria for Coordinated Entry at this time.  I believe the [employer] provides case management services. Clients just need to walk in during business hours and request case management services."  [name] is agreeable to going to see [facility] tomorrow- we discussed how to ask for help once there. If I have any additional f/u from [facility] or [facility] for Housing (accepted referral on [email or web address]) then I will let [name] know at that time.   Patient is participating in a Managed Medicaid Plan:  No, Medicare A&B and Medicaid of [place]   SDOH Screenings   Alcohol Screen: Low Risk  (03/20/2022)   Alcohol Screen    Last Alcohol Screening Score (AUDIT): 7  Depression (PHQ2-9): Low Risk  (03/20/2022)   Depression (PHQ2-9)    PHQ-2 Score: 0  Financial Resource Strain: Medium Risk (03/18/2022)   Overall Financial Resource Strain (CARDIA)    Difficulty of Paying Living Expenses: Somewhat hard  Food Insecurity: No Food Insecurity (03/18/2022)   Hunger Vital Sign    Worried About Running Out of Food in the Last Year: Never true    Ran Out of Food in the Last Year: Never true  Housing: Medium Risk (03/18/2022)   Housing    Last Housing Risk Score: 1  Physical Activity: Sufficiently Active (03/20/2022)   Exercise Vital Sign    Days of Exercise per Week: 7 days    Minutes of Exercise per Session: 30 min  Social Connections: Socially Isolated (03/20/2022)   Social Connection and Isolation Panel [NHANES]     Frequency of Communication with Friends and Family: More than three times a week    Frequency of Social Gatherings with Friends and Family: More than three times a week    Attends Religious Services: Never    Database administrator or Organizations: No    Attends Banker Meetings: Never    Marital Status: Never married  Stress: No Stress Concern Present (03/20/2022)   [employer] of Occupational Health - Occupational Stress Questionnaire    Feeling of Stress : Not at all  Tobacco Use: High Risk (03/20/2022)   Patient History    Smoking Tobacco Use: Every Day    Smokeless Tobacco Use: Never    Passive Exposure: Not on file  Transportation Needs: No Transportation Needs (03/18/2022)   PRAPARE - Transportation    Lack of Transportation (Medical): No    Lack of Transportation (Non-Medical): No    Octavio Graves, MSW, LCSW Clinical Social Worker II [facility] Health Heart/Vascular Care Navigation  [phone]- work cell phone (preferred) [phone]- desk phone

## 2022-04-09 ENCOUNTER — Telehealth: Payer: Self-pay | Admitting: Licensed Clinical Social Worker

## 2022-04-09 NOTE — Telephone Encounter (Signed)
H&V Care Navigation CSW Progress Note  Clinical Social Worker contacted patient by phone to f/u on housing challenges. Pt shares that he spoke with [facility] and was told a Child psychotherapist would be available in September, he plans to go down to [facility] tomorrow to speak with someone about any options/further case management he may be able to receive.   Patient is participating in a Managed Medicaid Plan:  No, Medicare A&B/Medicaid [place] Access only.   SDOH Screenings   Food Insecurity: No Food Insecurity (03/18/2022)  Housing: Medium Risk (03/18/2022)  Transportation Needs: No Transportation Needs (03/18/2022)  Alcohol Screen: Low Risk  (03/20/2022)  Depression (PHQ2-9): Low Risk  (03/20/2022)  Financial Resource Strain: Medium Risk (03/18/2022)  Physical Activity: Sufficiently Active (03/20/2022)  Social Connections: Socially Isolated (03/20/2022)  Stress: No Stress Concern Present (03/20/2022)  Tobacco Use: High Risk (03/20/2022)   Octavio Graves, MSW, LCSW Clinical Social Worker II [facility] Health Heart/Vascular Care Navigation  [phone]- work cell phone (preferred) [phone]- desk phone

## 2022-04-15 ENCOUNTER — Ambulatory Visit: Payer: Medicare Other | Admitting: Nurse Practitioner

## 2022-04-20 ENCOUNTER — Telehealth: Payer: Self-pay | Admitting: Licensed Clinical Social Worker

## 2022-04-20 NOTE — Telephone Encounter (Signed)
H&V Care Navigation CSW Progress Note  Clinical Social Worker contacted patient by phone to f/u on visit to [facility] for housing assistance. Pt states he went the day after we last spoke, unfortunately still no Education officer, museum there. He has made a plan that he will move to the [facility] next door to the [address] on [address]. Pt took down my name and number again should any additional questions/concerns arise. If I am connected with any additional housing resources (not eligible for assistance through Coordinated Entry as residing in hotel), I will f/u with pt. No additional immediate questions/concerns for this Probation officer.   Patient is participating in a Managed Medicaid Plan:  No, self pay only.   SDOH Screenings   Food Insecurity: No Food Insecurity (03/18/2022)  Housing: Medium Risk (03/18/2022)  Transportation Needs: No Transportation Needs (03/18/2022)  Alcohol Screen: Low Risk  (03/20/2022)  Depression (PHQ2-9): Low Risk  (03/20/2022)  Financial Resource Strain: Medium Risk (03/18/2022)  Physical Activity: Sufficiently Active (03/20/2022)  Social Connections: Socially Isolated (03/20/2022)  Stress: No Stress Concern Present (03/20/2022)  Tobacco Use: High Risk (03/20/2022)    Westley Hummer, MSW, LCSW Clinical Social Worker [place]  [phone]- work cell phone (preferred) [phone]- desk phone

## 2022-05-15 ENCOUNTER — Other Ambulatory Visit: Payer: Medicare Other

## 2022-05-18 ENCOUNTER — Encounter: Payer: Self-pay | Admitting: Nurse Practitioner

## 2022-05-18 ENCOUNTER — Ambulatory Visit: Payer: Medicare Other | Attending: Nurse Practitioner | Admitting: Nurse Practitioner

## 2022-05-18 VITALS — BP 130/79 | HR 91 | Temp 98.1°F | Ht 74.0 in | Wt 198.6 lb

## 2022-05-18 DIAGNOSIS — B9689 Other specified bacterial agents as the cause of diseases classified elsewhere: Secondary | ICD-10-CM | POA: Insufficient documentation

## 2022-05-18 DIAGNOSIS — Z79899 Other long term (current) drug therapy: Secondary | ICD-10-CM | POA: Insufficient documentation

## 2022-05-18 DIAGNOSIS — F319 Bipolar disorder, unspecified: Secondary | ICD-10-CM | POA: Diagnosis not present

## 2022-05-18 DIAGNOSIS — F419 Anxiety disorder, unspecified: Secondary | ICD-10-CM | POA: Diagnosis not present

## 2022-05-18 DIAGNOSIS — Z23 Encounter for immunization: Secondary | ICD-10-CM | POA: Insufficient documentation

## 2022-05-18 DIAGNOSIS — I10 Essential (primary) hypertension: Secondary | ICD-10-CM | POA: Diagnosis present

## 2022-05-18 DIAGNOSIS — F1021 Alcohol dependence, in remission: Secondary | ICD-10-CM | POA: Diagnosis not present

## 2022-05-18 DIAGNOSIS — L089 Local infection of the skin and subcutaneous tissue, unspecified: Secondary | ICD-10-CM | POA: Insufficient documentation

## 2022-05-18 DIAGNOSIS — Z7901 Long term (current) use of anticoagulants: Secondary | ICD-10-CM | POA: Diagnosis not present

## 2022-05-18 MED ORDER — MUPIROCIN 2 % EX OINT: 1.0000 | TOPICAL_OINTMENT | Freq: Two times a day (BID) | CUTANEOUS | 0 refills | Status: DC

## 2022-05-18 NOTE — Progress Notes (Signed)
Assessment & Plan:  [name] was seen today for hypertension.  Diagnoses and all orders for this visit:  Essential hypertension -     CMP14+EGFR Continue all antihypertensives as prescribed.  Reminded to bring in blood pressure log for follow  up appointment.  RECOMMENDATIONS: DASH/Mediterranean Diets are healthier choices for HTN.    Flu vaccine need -     Flu Vaccine QUAD High Dose(Fluad)  Alcohol dependence in remission (HCC) Recommended avoiding alcohol He consumes a 40 oz daily   Bacterial skin infection -     mupirocin ointment (BACTROBAN) 2 %; Apply 1 Application topically 2 (two) times daily.    Patient has been counseled on age-appropriate routine health concerns for screening and prevention. These are reviewed and up-to-date. Referrals have been placed accordingly. Immunizations are up-to-date or declined.    Subjective:   Chief Complaint  Patient presents with   Hypertension   HPI [name] 71 y.o. 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 BP Readings from Last 3 Encounters:  05/18/22 130/79  03/17/22 113/60  01/02/22 124/79    He has several red excoriated areas on his forehead. States he has been bitten by insects at night in his bed but adamantly denies any bed bugs.   Review of Systems  Constitutional:  Negative for fever, malaise/fatigue and weight loss.  HENT: Negative.  Negative for nosebleeds.   Eyes: Negative.  Negative for blurred vision, double vision and photophobia.  Respiratory: Negative.  Negative for cough and shortness of breath.   Cardiovascular: Negative.  Negative for chest pain, palpitations and leg swelling.   Gastrointestinal: Negative.  Negative for heartburn, nausea and vomiting.  Musculoskeletal: Negative.  Negative for myalgias.  Skin:  Positive for rash.  Neurological: Negative.  Negative for dizziness, focal weakness, seizures and headaches.  Psychiatric/Behavioral: Negative.  Negative for suicidal ideas.     Past Medical History:  Diagnosis Date   Acute urinary retention    Anxiety    Brain injury ([place]) 7915   Complication of anesthesia 08/29/13   difficulty due to large tongue, anterior larynx and limited opening   Difficult intubation 08/31/13   Head injury 1975   "Brain Stem Contusion"   Manic depression ([place])    Pelvic fracture ([place]) 09/21/13   Inferior Pubic Ramus, Left Superior Ramus   Schizo affective schizophrenia ([place])    Schizophrenia ([place])     Past Surgical History:  Procedure Laterality Date   COSMETIC SURGERY Left 1975   created new ear lobe   DRESSING CHANGE UNDER ANESTHESIA Left 08/31/2013   Procedure: DRESSING CHANGE UNDER ANESTHESIA for right ankle and left thigh;  Surgeon: Rozanna Box, MD;  Location: [facility];  Service: Orthopedics;  Laterality: Left;   I & D EXTREMITY Bilateral 08/30/2013   Procedure: IRRIGATION AND DEBRIDEMENT with closure Left Thigh wound, Irrigation and debridement Right Ankle ;  Surgeon: Alta Corning, MD;  Location: [place];  Service: Orthopedics;  Laterality: Bilateral;   I & D EXTREMITY Left 09/26/2013   Procedure: LEFT IRRIGATION AND DEBRIDEMENT Retta Diones;  Surgeon: Rozanna Box, MD;  Location: [place];  Service: Orthopedics;  Laterality: Left;   I & D EXTREMITY Left  10/02/2013   Procedure: IRRIGATION AND DEBRIDEMENT EXTREMITY;  Surgeon: Rozanna Box, MD;  Location: [facility];  Service: Orthopedics;  Laterality: Left;   INCISION AND DRAINAGE OF WOUND Left 09/21/2013   Procedure: IRRIGATION AND DEBRIDEMENT SOFT TISSUE WOUND WITH LARGE WOUND VAC PLACEMENT;  Surgeon: Rozanna Box, MD;  Location: [facility];  Service: Orthopedics;   Laterality: Left;   MANDIBLE FRACTURE SURGERY  1975   SACRO-ILIAC PINNING Left 08/31/2013   Procedure: LEFT SACRO-ILIAC PINNING;  Surgeon: Rozanna Box, MD;  Location: [place];  Service: Orthopedics;  Laterality: Left;   SKIN GRAFT     SKIN GRAFT Left 10/31/2013   left thigh      DR HANDY    SKIN SPLIT GRAFT Left 10/02/2013   Procedure: SKIN GRAFT SPLIT THICKNESS;  Surgeon: Rozanna Box, MD;  Location: [place];  Service: Orthopedics;  Laterality: Left;   SKIN SPLIT GRAFT Left 10/31/2013   Procedure: LEFT THIGH SKIN GRAFT SPLIT THICKNESS;  Surgeon: Rozanna Box, MD;  Location: [place];  Service: Orthopedics;  Laterality: Left;    Family History  Problem Relation Age of Onset   Diabetes Mother    Obsessive Compulsive Disorder Father    Bone cancer Father    Colon cancer Neg Hx    Rectal cancer Neg Hx    Liver cancer Neg Hx    Pancreatic cancer Neg Hx     Social History Reviewed with no changes to be made today.   Outpatient Medications Prior to Visit  Medication Sig Dispense Refill   busPIRone (BUSPAR) 15 MG tablet Take 1 tablet (15 mg total) by mouth 3 (three) times daily. 90 tablet 3   lisinopril (ZESTRIL) 5 MG tablet Take 1 tablet (5 mg total) by mouth daily. To lower blood pressure 90 tablet 1   metoprolol succinate (TOPROL-XL) 50 MG 24 hr tablet Take 1 tablet (50 mg total) by mouth daily. Take with or immediately following a meal. 90 tablet 1   OLANZapine (ZYPREXA) 20 MG tablet Take 1 tablet (20 mg total) by mouth at bedtime. 90 tablet 3   prazosin (MINIPRESS) 1 MG capsule Take 1 capsule (1 mg total) by mouth at bedtime. 90 capsule 3   rosuvastatin (CRESTOR) 40 MG tablet Take 1 tablet (40 mg total) by mouth daily. 90 tablet 3   No facility-administered medications prior to visit.    Allergies  Allergen Reactions   Penicillins Other (See Comments)    Unsure of reaction, had allergic reaction as a child and has not taken since then       Objective:    BP 130/79   Pulse  91   Temp 98.1 F (36.7 C) (Temporal)   Ht 6' 2"  (1.88 m)   Wt 198 lb 9.6 oz (90.1 kg)   SpO2 96%   BMI 25.50 kg/m  Wt Readings from Last 3 Encounters:  05/18/22 198 lb 9.6 oz (90.1 kg)  03/17/22 196 lb 12.8 oz (89.3 kg)  01/02/22 202 lb 12.8 oz (92 kg)    Physical Exam Vitals and nursing note reviewed.  Constitutional:      Appearance: He is well-developed.  HENT:     Head: Normocephalic and atraumatic.  Cardiovascular:     Rate and Rhythm: Normal rate and regular rhythm.     Heart sounds: Normal heart sounds. No murmur heard.    No friction rub. No gallop.  Pulmonary:     Effort: Pulmonary effort is normal. No tachypnea or respiratory  distress.     Breath sounds: Normal breath sounds. No decreased breath sounds, wheezing, rhonchi or rales.  Chest:     Chest wall: No tenderness.  Abdominal:     General: Bowel sounds are normal.     Palpations: Abdomen is soft.  Musculoskeletal:        General: Normal range of motion.     Cervical back: Normal range of motion.  Skin:    General: Skin is warm and dry.  Neurological:     Mental Status: He is alert and oriented to person, place, and time.     Coordination: Coordination normal.  Psychiatric:        Behavior: Behavior normal. Behavior is cooperative.        Thought Content: Thought content normal.        Judgment: Judgment normal.          Patient has been counseled extensively about nutrition and exercise as well as the importance of adherence with medications and regular follow-up. The patient was given clear instructions to go to ER or return to medical center if symptoms don't improve, worsen or new problems develop. The patient verbalized understanding.   Follow-up: Return in about 3 months (around 08/18/2022).   Gildardo Pounds, FNP-BC [facility] and [place], [place]   05/18/2022, 3:37 PM

## 2022-05-19 LAB — CMP14+EGFR
ALT: 16 IU/L (ref 0–44)
AST: 27 IU/L (ref 0–40)
Albumin/Globulin Ratio: 1.4 (ref 1.2–2.2)
Albumin: 4.3 g/dL (ref 3.9–4.9)
Alkaline Phosphatase: 139 IU/L — ABNORMAL HIGH (ref 44–121)
BUN/Creatinine Ratio: 9 — ABNORMAL LOW (ref 10–24)
BUN: 8 mg/dL (ref 8–27)
Bilirubin Total: 1 mg/dL (ref 0.0–1.2)
CO2: 21 mmol/L (ref 20–29)
Calcium: 9.5 mg/dL (ref 8.6–10.2)
Chloride: 101 mmol/L (ref 96–106)
Creatinine, Ser: 0.89 mg/dL (ref 0.76–1.27)
Globulin, Total: 3.1 g/dL (ref 1.5–4.5)
Glucose: 87 mg/dL (ref 70–99)
Potassium: 4.5 mmol/L (ref 3.5–5.2)
Sodium: 140 mmol/L (ref 134–144)
Total Protein: 7.4 g/dL (ref 6.0–8.5)
eGFR: 92 mL/min/{1.73_m2} (ref >59)

## 2022-05-22 ENCOUNTER — Ambulatory Visit: Payer: Medicare Other | Attending: Nurse Practitioner

## 2022-05-22 DIAGNOSIS — E782 Mixed hyperlipidemia: Secondary | ICD-10-CM

## 2022-05-22 DIAGNOSIS — I700 Atherosclerosis of aorta: Secondary | ICD-10-CM

## 2022-05-22 LAB — HEPATIC FUNCTION PANEL
ALT: 15 IU/L (ref 0–44)
AST: 17 IU/L (ref 0–40)
Albumin: 4.3 g/dL (ref 3.9–4.9)
Alkaline Phosphatase: 138 IU/L — ABNORMAL HIGH (ref 44–121)
Bilirubin Total: 0.8 mg/dL (ref 0.0–1.2)
Bilirubin, Direct: 0.24 mg/dL (ref 0.00–0.40)
Total Protein: 6.9 g/dL (ref 6.0–8.5)

## 2022-05-22 LAB — LIPID PANEL
Chol/HDL Ratio: 3 ratio (ref 0.0–5.0)
Cholesterol, Total: 150 mg/dL (ref 100–199)
HDL: 50 mg/dL (ref >39)
LDL Chol Calc (NIH): 79 mg/dL (ref 0–99)
Triglycerides: 120 mg/dL (ref 0–149)
VLDL Cholesterol Cal: 21 mg/dL (ref 5–40)

## 2022-06-01 ENCOUNTER — Encounter (HOSPITAL_COMMUNITY): Payer: Self-pay | Admitting: Psychiatry

## 2022-06-01 ENCOUNTER — Telehealth (INDEPENDENT_AMBULATORY_CARE_PROVIDER_SITE_OTHER): Payer: Medicare Other | Admitting: Psychiatry

## 2022-06-01 DIAGNOSIS — N400 Enlarged prostate without lower urinary tract symptoms: Secondary | ICD-10-CM

## 2022-06-01 DIAGNOSIS — F209 Schizophrenia, unspecified: Secondary | ICD-10-CM

## 2022-06-01 MED ORDER — PRAZOSIN HCL 1 MG PO CAPS: 1.0000 mg | ORAL_CAPSULE | Freq: Every day | ORAL | 3 refills | Status: DC

## 2022-06-01 MED ORDER — BUSPIRONE HCL 15 MG PO TABS: 15.0000 mg | ORAL_TABLET | Freq: Three times a day (TID) | ORAL | 3 refills | Status: DC

## 2022-06-01 MED ORDER — OLANZAPINE 20 MG PO TABS: 20.0000 mg | ORAL_TABLET | Freq: Every day | ORAL | 3 refills | Status: DC

## 2022-06-01 NOTE — Progress Notes (Signed)
BH MD/PA/NP OP Progress Note Virtual Visit via Telephone Note  I connected with [name] on 06/01/22 at  2:30 PM EDT by telephone and verified that I am speaking with the correct person using two identifiers.  Location: Patient: home Provider: Clinic   I discussed the limitations, risks, security and privacy concerns of performing an evaluation and management service by telephone and the availability of in person appointments. I also discussed with the patient that there may be a patient responsible charge related to this service. The patient expressed understanding and agreed to proceed.   I provided 30 minutes of non-face-to-face time during this encounter.  06/01/2022 10:37 AM [name]  MRN:  [id number]  Chief Complaint: "I maybe homeless soon"  HPI: 71 year old male seen today for follow up psychiatric evaluation  He has a psychiatric history of schizophrenia, schizoaffective disorder, bipolar disorder, tobacco use, and PTSD.  He is currently managed on Zyprexa 20 mg daily, prazosin 1 mg nightly, and BuSpar 15 mg 3 times daily.  Today he notes his medications are effective in managing his psychiatric conditions.   Today patient was unable to login virtually so his assessment was done over the phone. During exam he was pleasant, cooperative, and engaged in conversation.  He informed provider that he may be homeless soon as he is running out of current from a past settlement.  Patient notes that he does receive retirement of $73.  He notes that he received a letter from [facility] telling him that he requires a payee to receive his funds.  Provider recommended patient seeing DSS caseworker to discuss this letter.  He endorsed understanding and agreed.  Patient also notes that he has received a one-time check for $901 from unclaimed money findings.  He also notes that he receives food stamps.  Provider gave patient a list of homeless shelters around the area.    Despite  the above stressors patient notes that his anxiety and depression are well managed.  Provider conducted a GAD-7 and patient scored a 4.  He reports that his main concern is facing homelessness and finances.  Provider also conducted PHQ-9 and patient scored a 5.  He endorses adequate sleep and appetite.  Patient reports that he continues to have auditory hallucinations of music.  He also notes that at times he talks to himself and later hears voices talking back to him.  He denies SI/HI or paranoia.    Overall patient notes that he is doing well. No medication changes made today.  Patient agreeable to continue medications as prescribed.  He will follow-up DSS caseworker to discuss the above forms.  No other concerns noted at this time. Visit Diagnosis:  No diagnosis found.   Past Psychiatric History: schizophrenia, schizoaffective disorder, bipolar disorder, tobacco use, and PTSD  Past Medical History:  Past Medical History:  Diagnosis Date   Acute urinary retention    Anxiety    Brain injury ([place]) [id number]   Complication of anesthesia 08/29/13   difficulty due to large tongue, anterior larynx and limited opening   Difficult intubation 08/31/13   Head injury 1975   "Brain Stem Contusion"   Manic depression ([place])    Pelvic fracture ([place]) 09/21/13   Inferior Pubic Ramus, Left Superior Ramus   Schizo affective schizophrenia ([place])    Schizophrenia ([place])     Past Surgical History:  Procedure Laterality Date   [place]   created new ear lobe   DRESSING  CHANGE UNDER ANESTHESIA Left 08/31/2013   Procedure: DRESSING CHANGE UNDER ANESTHESIA for right ankle and left thigh;  Surgeon: Budd Palmer, MD;  Location: [facility] OR;  Service: Orthopedics;  Laterality: Left;   I & D EXTREMITY Bilateral 08/30/2013   Procedure: IRRIGATION AND DEBRIDEMENT with closure Left Thigh wound, Irrigation and debridement Right Ankle ;  Surgeon: Harvie Junior, MD;  Location: [facility] OR;  Service: Orthopedics;   Laterality: Bilateral;   I & D EXTREMITY Left 09/26/2013   Procedure: LEFT IRRIGATION AND DEBRIDEMENT Robby Sermon;  Surgeon: Budd Palmer, MD;  Location[facility] OR;  Service: Orthopedics;  Laterality: Left;   I & D EXTREMITY Left 10/02/2013   Procedure: IRRIGATION AND DEBRIDEMENT EXTREMITY;  Surgeon: Budd Palmer, MD;  Location: [facility] OR;  Service: Orthopedics;  Laterality: Left;   INCISION AND DRAINAGE OF WOUND Left 09/21/2013   Procedure: IRRIGATION AND DEBRIDEMENT SOFT TISSUE WOUND WITH LARGE WOUND VAC PLACEMENT;  Surgeon: Budd Palmer, MD;  Location: [facility] OR;  Service: Orthopedics;  Laterality: Left;   MANDIBLE FRACTURE SURGERY  1975   SACRO-ILIAC PINNING Left 08/31/2013   Procedure: LEFT SACRO-ILIAC PINNING;  Surgeon: Budd Palmer, MD;  Location: [facility] OR;  Service: Orthopedics;  Laterality: Left;   SKIN GRAFT     SKIN GRAFT Left 10/31/2013   left thigh      DR HANDY    SKIN SPLIT GRAFT Left 10/02/2013   Procedure: SKIN GRAFT SPLIT THICKNESS;  Surgeon: Budd Palmer, MD;  Location: [facility] OR;  Service: Orthopedics;  Laterality: Left;   SKIN SPLIT GRAFT Left 10/31/2013   Procedure: LEFT THIGH SKIN GRAFT SPLIT THICKNESS;  Surgeon: Budd Palmer, MD;  Location: [facility] OR;  Service: Orthopedics;  Laterality: Left;    Family Psychiatric History: Denies  Family History:  Family History  Problem Relation Age of Onset   Diabetes Mother    Obsessive Compulsive Disorder Father    Bone cancer Father    Colon cancer Neg Hx    Rectal cancer Neg Hx    Liver cancer Neg Hx    Pancreatic cancer Neg Hx     Social History:  Social History   Socioeconomic History   Marital status: Single    Spouse name: Not on file   Number of children: Not on file   Years of education: Not on file   Highest education level: Not on file  Occupational History   Not on file  Tobacco Use   Smoking status: Every Day   Smokeless tobacco: Never  Vaping Use   Vaping Use: Never used  Substance and Sexual  Activity   Alcohol use: Yes    Alcohol/week: 6.0 standard drinks of alcohol    Types: 6 Cans of beer per week    Comment: weekly   Drug use: No   Sexual activity: Never    Comment: Quit smolikg 08/29/2013  Other Topics Concern   Not on file  Social History Narrative   Not on file   Social Determinants of Health   Financial Resource Strain: Medium Risk (03/18/2022)   Overall Financial Resource Strain (CARDIA)    Difficulty of Paying Living Expenses: Somewhat hard  Food Insecurity: No Food Insecurity (03/18/2022)   Hunger Vital Sign    Worried About Running Out of Food in the Last Year: Never true    Ran Out of Food in the Last Year: Never true  Transportation Needs: No Transportation Needs (03/18/2022)   PRAPARE - Transportation  Lack of Transportation (Medical): No    Lack of Transportation (Non-Medical): No  Physical Activity: Sufficiently Active (03/20/2022)   Exercise Vital Sign    Days of Exercise per Week: 7 days    Minutes of Exercise per Session: 30 min  Stress: No Stress Concern Present (03/20/2022)   [place]    Feeling of Stress : Not at all  Social Connections: Socially Isolated (03/20/2022)   Social Connection and Isolation Panel [NHANES]    Frequency of Communication with Friends and Family: More than three times a week    Frequency of Social Gatherings with Friends and Family: More than three times a week    Attends Religious Services: Never    Marine scientist or Organizations: No    Attends Archivist Meetings: Never    Marital Status: Never married    Allergies:  Allergies  Allergen Reactions   Penicillins Other (See Comments)    Unsure of reaction, had allergic reaction as a child and has not taken since then    Metabolic Disorder Labs: Lab Results  Component Value Date   HGBA1C 5.6 01/02/2022   No results found for: "PROLACTIN" Lab Results  Component Value Date    CHOL 150 05/22/2022   TRIG 120 05/22/2022   HDL 50 05/22/2022   CHOLHDL 3.0 05/22/2022   LDLCALC 79 05/22/2022   LDLCALC 108 (H) 01/02/2022   Lab Results  Component Value Date   TSH 1.250 10/23/2020    Therapeutic Level Labs: No results found for: "LITHIUM" No results found for: "VALPROATE" No results found for: "CBMZ"  Current Medications: Current Outpatient Medications  Medication Sig Dispense Refill   busPIRone (BUSPAR) 15 MG tablet Take 1 tablet (15 mg total) by mouth 3 (three) times daily. 90 tablet 3   lisinopril (ZESTRIL) 5 MG tablet Take 1 tablet (5 mg total) by mouth daily. To lower blood pressure 90 tablet 1   metoprolol succinate (TOPROL-XL) 50 MG 24 hr tablet Take 1 tablet (50 mg total) by mouth daily. Take with or immediately following a meal. 90 tablet 1   mupirocin ointment (BACTROBAN) 2 % Apply 1 Application topically 2 (two) times daily. 60 g 0   OLANZapine (ZYPREXA) 20 MG tablet Take 1 tablet (20 mg total) by mouth at bedtime. 90 tablet 3   prazosin (MINIPRESS) 1 MG capsule Take 1 capsule (1 mg total) by mouth at bedtime. 90 capsule 3   rosuvastatin (CRESTOR) 40 MG tablet Take 1 tablet (40 mg total) by mouth daily. 90 tablet 3   No current facility-administered medications for this visit.     Musculoskeletal: Strength & Muscle Tone:  Unable to assess due to telephone visit Gait & Station:  Unable to assess due to telephone visit Patient leans: N/A  Psychiatric Specialty Exam: Review of Systems  There were no vitals taken for this visit.There is no height or weight on file to calculate BMI.  General Appearance:  Unable to assess due to telephone visit  Eye Contact:   Unable to assess due to telephone visit  Speech:  Clear and Coherent and Normal Rate  Volume:  Normal  Mood:  Euthymic  Affect:  Appropriate and Congruent  Thought Process:  Coherent, Goal Directed, and Linear  Orientation:  Full (Time, Place, and Person)  Thought Content: Delusions and  Hallucinations: Auditory   Suicidal Thoughts:  No  Homicidal Thoughts:  No  Memory:  Immediate;   Good Recent;  Good Remote;   Good  Judgement:  Good  Insight:  Good  Psychomotor Activity:   Unable to assess due to telephone visit  Concentration:  Concentration: Good and Attention Span: Good  Recall:  Good  Fund of Knowledge: Good  Language: Good  Akathisia:   Unable to assess due to telephone visit  Handed:  Right  AIMS (if indicated): Not done  Assets:  Communication Skills Desire for Improvement Financial Resources/Insurance Housing Leisure Time  ADL's:  Intact  Cognition: WNL  Sleep:  Good   Screenings: GAD-7    Personnel officer Visit from 05/18/2022 in [place] Office Visit from 01/02/2022 in [place] Office Visit from 12/12/2021 in [facility] Office Visit from 10/01/2021 in [place] Office Visit from 09/08/2021 in [facility]  Total GAD-7 Score 1 1 3  0 3      PHQ2-9    [place] Office Visit from 05/18/2022 in [place] from 03/20/2022 in [place] Office Visit from 01/02/2022 in [place] Office Visit from 12/12/2021 in [facility] Office Visit from 10/01/2021 in [place]  PHQ-2 Total Score 0 0 0 2 0  PHQ-9 Total Score 2 -- 2 4 1       [place] Office Visit from 12/12/2021 in [place] from 12/12/2020 in [place] from 09/17/2020 in [place] No Risk No Risk No Risk        Assessment and Plan: Patient endorses auditory hallucinations.  He also reports that he is anxious because he is facing  homelessness and worries about financial stressors.  Provider gave patient a list of homeless shelters around the area.  Provider also recommended patient talk to [place] caseworker.  He endorsed understanding and agreed.  He reports that he finds his medications effective and requests that they not be adjusted.  Patient agreeable to continue medication as prescribed.  1. Schizophrenia, unspecified type ([place])  Continue- busPIRone (BUSPAR) 15 MG tablet; Take 1 tablet (15 mg total) by mouth daily.  Dispense: 90 tablet; Refill: 3 Continue- OLANZapine (ZYPREXA) 20 MG tablet; Take 1 tablet (20 mg total) by mouth at bedtime.  Dispense: 90 tablet; Refill: 3  2. Benign prostatic hyperplasia, unspecified whether lower urinary tract symptoms present  Continue- prazosin (MINIPRESS) 1 MG capsule; Take 1 capsule (1 mg total) by mouth at bedtime.  Dispense: 90 capsule; Refill: 3  Follow-up in 3 months  Salley Slaughter, NP 06/01/2022, 10:37 AM

## 2022-06-17 ENCOUNTER — Telehealth: Payer: Self-pay

## 2022-06-17 NOTE — Telephone Encounter (Signed)
I received a call from Savannah/ SW intern at [facility].  She explained that she has been working with the patient to secure permanent housing. He received money from a " settlement" and has been paying to stay at a motel but his money will run out 07/09/2022.    She was inquiring about Assisted Living Facilities. I explained that she can call ALFs in [facility] to inquire if they have any male beds available and if the patient would be appropriate for their care.  I explained that for Medicaid to pay for the care, he would need to require assistance with ADLs.  I also informed her that the facility would retain most of his monthly disability check ,except for $30-60/ month.  She said his problems are more related to behavioral health and he receives services from [facility]. .  I then explained that the TCL program might be more appropriate for him.  She can place that referral online but it is important to make sure that he has a CCA from behavioral health. She said she  understood and would call behavioral health with the patient.  She agreed that this program would provide better support than ALF  for patient's behavioral health needs.  This program does offer housing and support in the community for individuals with severe persistent mental illness but the process for approval for the program can take weeks/months    I told her that if needed, we can provide an FL2 for the facilities/ programs as needed.  I provided her with the phone numbers for [facility], [facility], [facility] and [facility] ALFs and she said she would call to inquire about bed availability.

## 2022-06-23 NOTE — Telephone Encounter (Signed)
Pt will be no longer have housing beyond December 7th without having these forms completed. [name] has an appt to see the patient Tuesday 06/30/2022.

## 2022-06-23 NOTE — Telephone Encounter (Signed)
[name] returned the call stating that she wants to begin the process of having the FL2 and CCA forms completed with his PCP.   Best contact: [phone]

## 2022-07-01 ENCOUNTER — Telehealth: Payer: Self-pay | Admitting: Licensed Clinical Social Worker

## 2022-07-01 NOTE — Telephone Encounter (Signed)
I called the patient back and informed him that I have the signed FL2 but need to know where to send it.  Savannah, his caseworker at the [facility] will be out of the office until January. When I asked him about the CCA he was not aware of the need for that documentation to be obtained from behavioral health.  He said that this housing is for a pallet home.  I called an left a message for [facility] [phone] requesting a call back  in the event she is still available to answer questions. I also left a message at the [facility] general mailbox and requested a call back.

## 2022-07-01 NOTE — Telephone Encounter (Signed)
Pt checking status on completion of forms  Please contact pt w/ a status update

## 2022-07-02 NOTE — Telephone Encounter (Signed)
I called the [facility] [phone] to inquire about where to send the [facility] for TCL Program and also inquire if patient has completed a CCA. Message left on voicemail for Rachel/Medical Clinic with call back requested.   I also tried to reach the PATH program team lead and her voicemail was full.

## 2022-07-03 NOTE — Patient Outreach (Addendum)
  Care Coordination   Collaboration  Visit Note   07/03/2022 Name: [name] MRN: [id number] DOB: 10-22-1950  [name] is a 71 y.o. year old male who sees Claiborne Rigg, NP for primary care. I spoke with RNCM, Robyne Peers, LCSW, Octavio Graves, and [facility] intern  What matters to the patients health and wellness today?  Patient was not engaged during encounter   Patient needs assistance obtaining stable and affordable housing. LCSW has spoken with Charlotte Sanes [facility] intern about referrals to programs, such as, TCL. The referral was submitted last week online and RNCM will assist with completion of FL2. Charlotte Sanes will f/up with pt on 08/12/22 noting he has received funding to remain at current hotel for next 2 months.   Additional hx obtained from [facility] and LCSW Chasse, in addition, to suggestions to strengthen support for him.   SDOH assessments and interventions completed:  No     Care Coordination Interventions:  Yes, provided   Follow up plan:  LCSW will reach out to patient and offer care coordination services    Encounter Outcome:  Pt. Visit Completed   Jenel Lucks, MSW, LCSW [facility] Care Management [facility]  [facility] [email or web address] Phone [phone] 6:24 AM

## 2022-07-11 ENCOUNTER — Telehealth: Payer: Self-pay | Admitting: Nurse Practitioner

## 2022-07-13 ENCOUNTER — Other Ambulatory Visit: Payer: Self-pay | Admitting: Nurse Practitioner

## 2022-07-13 DIAGNOSIS — Z122 Encounter for screening for malignant neoplasm of respiratory organs: Secondary | ICD-10-CM

## 2022-07-13 DIAGNOSIS — I10 Essential (primary) hypertension: Secondary | ICD-10-CM

## 2022-07-13 DIAGNOSIS — F17210 Nicotine dependence, cigarettes, uncomplicated: Secondary | ICD-10-CM

## 2022-07-13 DIAGNOSIS — I700 Atherosclerosis of aorta: Secondary | ICD-10-CM

## 2022-07-13 MED ORDER — BISOPROLOL FUMARATE 5 MG PO TABS: 5.0000 mg | ORAL_TABLET | Freq: Every day | ORAL | 1 refills | Status: DC

## 2022-07-13 NOTE — Telephone Encounter (Signed)
Patient stated his body "locks up" when taking Metoprolol Succinate.

## 2022-07-13 NOTE — Telephone Encounter (Signed)
Sent bisoprolol to pharmacy. He can switch from metoprolol to bisoprolol

## 2022-07-13 NOTE — Telephone Encounter (Signed)
Ct scheduled

## 2022-07-14 NOTE — Telephone Encounter (Signed)
Voicemail left with response from provider.  

## 2022-07-14 NOTE — Telephone Encounter (Signed)
I called the patient and explained that I have not received a call back from [employer] regarding where to send the FL2.  He could not remember who he spoke to there.  I told him to please let me know if he speaks to someone at [facility] and is able to get their phone number so I can speak with them directly.   He said he is currently at the [facility] and has paid through January 2024.  He said they spoke to him about transferring to a pallet home but he said no because he has already paid for the motel.

## 2022-07-16 NOTE — Telephone Encounter (Signed)
I again tried to reach the [facility] and had to leave a message on voicemail of Rachel/Medical Clinic requesting a call back  - fax number needed to send FL2 to [facility]

## 2022-07-16 NOTE — Telephone Encounter (Signed)
Message received from [facility] stating that the patient called and said the [facility] needs to be sent to a [facility] at [facility]) on [address] The number there is [phone].  I tried multiple times to reach someone at the [facility] obtain their fax number and was not able to reach anyone.   I called the patient to let him know I received his message and have not been able to reach anyone at the [facility] yet but I will continue to try. I also informed him that Charlotte Sanes will be out of the office until after 08/03/2022.

## 2022-07-21 NOTE — Telephone Encounter (Signed)
I called patient to provide him with an update on his request to send FL2 to [facility].  I told him that I have not been able to reach anyone at [facility], no call back. He said that [facility], his caseworker will not be back in the office until after 1/1/10224 and he did not have another name/ number of someone there to contact. He said he has an appointment with [facility] 08/12/2022.

## 2022-08-11 ENCOUNTER — Inpatient Hospital Stay: Admission: RE | Admit: 2022-08-11 | Payer: Medicare Other | Source: Ambulatory Visit

## 2022-08-13 NOTE — Telephone Encounter (Signed)
I called Savannah/ IRC and left a message requesting a call back.  I need to confirm who the FL2 is to be faxed to.

## 2022-08-18 ENCOUNTER — Ambulatory Visit: Payer: Medicare Other | Attending: Nurse Practitioner | Admitting: Nurse Practitioner

## 2022-08-18 ENCOUNTER — Encounter: Payer: Self-pay | Admitting: Nurse Practitioner

## 2022-08-18 DIAGNOSIS — I959 Hypotension, unspecified: Secondary | ICD-10-CM | POA: Diagnosis not present

## 2022-08-18 DIAGNOSIS — Z79899 Other long term (current) drug therapy: Secondary | ICD-10-CM | POA: Diagnosis not present

## 2022-08-18 DIAGNOSIS — I10 Essential (primary) hypertension: Secondary | ICD-10-CM | POA: Diagnosis present

## 2022-08-18 MED ORDER — LISINOPRIL 5 MG PO TABS: 5.0000 mg | ORAL_TABLET | Freq: Every day | ORAL | 1 refills | Status: DC

## 2022-08-18 NOTE — Telephone Encounter (Signed)
I spoke to Savannah,SW/IRC.  She explained that the application for the pallet house was completed but the patient then received another "check" and decided to stay at the motel where he was for another month. He thought he was finished receiving the back payment checks so he was surprised to receive one.   [place] Mam said that she plans to see him tomorrow, and if not tomorrow, next week.  I explained that a TCL referral can be placed but it would be best for him to obtain the CCA as well as his award letter from [facility].  She said she will work with him to obtain those documents and will let me know when she has them and I can place the TCL referral.  I told her to please call me if she has any questions

## 2022-08-18 NOTE — Progress Notes (Signed)
Assessment & Plan:  [name] was seen today for hypertension.  Diagnoses and all orders for this visit:  Essential hypertension -     lisinopril (ZESTRIL) 5 MG tablet; Take 1 tablet (5 mg total) by mouth daily. To lower blood pressure Continue all antihypertensives as prescribed.  Reminded to bring in blood pressure log for follow  up appointment.  RECOMMENDATIONS: DASH/Mediterranean Diets are healthier choices for HTN.     Patient has been counseled on age-appropriate routine health concerns for screening and prevention. These are reviewed and up-to-date. Referrals have been placed accordingly. Immunizations are up-to-date or declined.    Subjective:   Chief Complaint  Patient presents with   Hypertension   HPI [name] 72 y.o. 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  BP Readings from Last 3 Encounters:  08/18/22 (!) 141/83  05/18/22 130/79  03/17/22 113/60     ROS  Past Medical History:  Diagnosis Date   Acute urinary retention    Anxiety    Brain injury (HCC) 1975   Complication of anesthesia 08/29/13   difficulty due to large tongue, anterior larynx and limited opening   Difficult intubation 08/31/13   Head injury 1975   "Brain Stem Contusion"   Manic depression (HCC)    Pelvic fracture (HCC) 09/21/13   Inferior Pubic Ramus, Left Superior Ramus   Schizo affective schizophrenia (HCC)    Schizophrenia (HCC)     Past Surgical History:  Procedure Laterality Date   COSMETIC SURGERY Left 1975   created new ear lobe   DRESSING CHANGE UNDER ANESTHESIA Left 08/31/2013   Procedure:  DRESSING CHANGE UNDER ANESTHESIA for right ankle and left thigh;  Surgeon: Budd Palmer, MD;  [facility];  Service: Orthopedics;  Laterality: Left;   I & D EXTREMITY Bilateral 08/30/2013   Procedure: IRRIGATION AND DEBRIDEMENT with closure Left Thigh wound, Irrigation and debridement Right Ankle ;  Surgeon: Harvie Junior, MD;  Location: [facility] OR;  Service: Orthopedics;  Laterality: Bilateral;   I & D EXTREMITY Left 09/26/2013   Procedure: LEFT IRRIGATION AND DEBRIDEMENT Robby Sermon;  Surgeon: Budd Palmer, MD;  Location: [facility] OR;  Service: Orthopedics;  Laterality: Left;   I & D EXTREMITY Left 10/02/2013   Procedure: IRRIGATION AND DEBRIDEMENT EXTREMITY;  Surgeon: Budd Palmer, MD;  Location: [facility] OR;  Service: Orthopedics;  Laterality: Left;   INCISION AND DRAINAGE OF WOUND Left 09/21/2013   Procedure: IRRIGATION AND DEBRIDEMENT SOFT TISSUE WOUND WITH LARGE WOUND VAC PLACEMENT;  Surgeon: Budd Palmer, MD;  Location: [facility];  Service: Orthopedics;  Laterality: Left;   MANDIBLE FRACTURE SURGERY  1975   SACRO-ILIAC PINNING Left 08/31/2013   Procedure: LEFT SACRO-ILIAC PINNING;  Surgeon: Budd Palmer, MD;  Location[facility] OR;  Service: Orthopedics;  Laterality: Left;   SKIN GRAFT     SKIN GRAFT Left 10/31/2013   left thigh      DR HANDY    SKIN SPLIT GRAFT Left 10/02/2013   Procedure: SKIN GRAFT SPLIT THICKNESS;  Surgeon: Budd Palmer, MD;  Location: [facility] OR;  Service: Orthopedics;  Laterality:  Left;   SKIN SPLIT GRAFT Left 10/31/2013   Procedure: LEFT THIGH SKIN GRAFT SPLIT THICKNESS;  Surgeon: Rozanna Box, MD;  Location: [place];  Service: Orthopedics;  Laterality: Left;    Family History  Problem Relation Age of Onset   Diabetes Mother    Obsessive Compulsive Disorder Father    Bone cancer Father    Colon cancer Neg Hx    Rectal cancer Neg Hx    Liver cancer Neg Hx    Pancreatic cancer Neg Hx     Social History Reviewed with no changes to be made today.   Outpatient  Medications Prior to Visit  Medication Sig Dispense Refill   bisoprolol (ZEBETA) 5 MG tablet Take 1 tablet (5 mg total) by mouth daily. 90 tablet 1   busPIRone (BUSPAR) 15 MG tablet Take 1 tablet (15 mg total) by mouth 3 (three) times daily. 90 tablet 3   mupirocin ointment (BACTROBAN) 2 % Apply 1 Application topically 2 (two) times daily. 60 g 0   OLANZapine (ZYPREXA) 20 MG tablet Take 1 tablet (20 mg total) by mouth at bedtime. 90 tablet 3   prazosin (MINIPRESS) 1 MG capsule Take 1 capsule (1 mg total) by mouth at bedtime. 90 capsule 3   rosuvastatin (CRESTOR) 40 MG tablet Take 1 tablet (40 mg total) by mouth daily. 90 tablet 3   lisinopril (ZESTRIL) 5 MG tablet Take 1 tablet (5 mg total) by mouth daily. To lower blood pressure 90 tablet 1   No facility-administered medications prior to visit.    Allergies  Allergen Reactions   Penicillins Other (See Comments)    Unsure of reaction, had allergic reaction as a child and has not taken since then       Objective:    BP (!) 141/83   Pulse (!) 101   Ht 6' 2.5" (1.892 m)   Wt 191 lb 9.6 oz (86.9 kg)   SpO2 99%   BMI 24.27 kg/m  Wt Readings from Last 3 Encounters:  08/18/22 191 lb 9.6 oz (86.9 kg)  05/18/22 198 lb 9.6 oz (90.1 kg)  03/17/22 196 lb 12.8 oz (89.3 kg)    Physical Exam Vitals and nursing note reviewed.  Constitutional:      Appearance: He is well-developed.     Comments: Unkempt Hair matted Clothes with spots and stains on them  HENT:     Head: Normocephalic and atraumatic.  Cardiovascular:     Rate and Rhythm: Normal rate and regular rhythm.     Heart sounds: Normal heart sounds. No murmur heard.    No friction rub. No gallop.  Pulmonary:     Effort: Pulmonary effort is normal. No tachypnea or respiratory distress.     Breath sounds: Normal breath sounds. No decreased breath sounds, wheezing, rhonchi or rales.  Chest:     Chest wall: No tenderness.  Abdominal:     General: Bowel sounds are normal.      Palpations: Abdomen is soft.  Musculoskeletal:        General: Normal range of motion.     Cervical back: Normal range of motion.  Skin:    General: Skin is warm and dry.  Neurological:     Mental Status: He is alert and oriented to person, place, and time.     Coordination: Coordination normal.  Psychiatric:        Behavior: Behavior normal. Behavior is cooperative.        Thought Content: Thought content normal.  Judgment: Judgment normal.          Patient has been counseled extensively about nutrition and exercise as well as the importance of adherence with medications and regular follow-up. The patient was given clear instructions to go to ER or return to medical center if symptoms don't improve, worsen or new problems develop. The patient verbalized understanding.   Follow-up: Return in 3 months (on 11/17/2022) for physical.   Gildardo Pounds, FNP-BC [facility] and [place], [place]   08/18/2022, 3:23 PM

## 2022-08-25 NOTE — Progress Notes (Unsigned)
BH MD Outpatient Progress Note  08/27/2022 3:36 PM [name]  MRN:  [id number]  Assessment:  [name] presents for follow-up evaluation in-person. He previously followed [name] at [facility] (last seen 06/01/22). Today, 08/27/22, patient reports mood has been stable with current medication regiment and denies any acute psychotic symptoms. He is slightly concerned about housing as he will be unable to stay at the current motel he has been in for the past 3 years. He plans to reach out to [facility] tomorrow to initiate intake process. Release of Information has been signed by patient so information can be provided to [facility] if needed.  Identifying Information: [name] is a 72 y.o. y.o. male with a history of schizophrenia, schizoaffective disorder, bipolar disorder, tobacco use, PTSD, brain injury in 1975, hypertension who is an established patient with [facility] for medication management.   Plan:  # Schizophrenia Past medication trials: haldol (EPS) Status of problem: stable Interventions: -- Continue zyprexa 20 mg daily for psychosis -- Continue buspar 15 mg tid  # PTSD Past medication trials:  Status of problem: stable Interventions: -- continue prazosin 1 mg qhs for nightmares Patient was given contact information for behavioral health clinic and was instructed to call 911 for emergencies.   Subjective:  Chief Complaint:  Chief Complaint  Patient presents with   Schizophrenia    Interval History:  Patient states he has been doing well. He's by himself a lot but states this is by his own volition. He does some socialization with the gas station clerk when he goes to buy [employer]. He states he sleeps from 2:30 PM to 6:00 AM. He states that his mood has been "even" denying manic or depressive symptoms.  He states he looks forward to drinking a Public librarian energy drink every morning.  He is mildly concerned about where he  will be staying next month as he can no longer afford to stay at the end of that he has been staying up for 3 years.  He [facility]  He denies present suicidal/homicidal ideation.  He denies psychotic symptoms.  He endorses occasional paranoia but has not been drastically impaired by this.  He states that he gets his food from [employer] every month with his food stamps.  He denies having any PTSD symptoms at this time and states that the prazosin has been very helpful with his nightmares.  He denies any substance use including alcohol, cannabis, or illicit substance use.  He states that he is paranoid that the police will come get him so he decided not to use those substances for that reason.  He has no acute questions at this time and is to be continued on his current medication regimen which he is agreeable to.   Visit Diagnosis:    ICD-10-CM   1. Schizophrenia, unspecified type (HCC)  F20.9 busPIRone (BUSPAR) 15 MG tablet    OLANZapine (ZYPREXA) 20 MG tablet    2. Alcohol dependence in remission (HCC)  F10.21     3. Anxiety  F41.9     4. Benign prostatic hyperplasia, unspecified whether lower urinary tract symptoms present  N40.0 prazosin (MINIPRESS) 1 MG capsule    5. PTSD (post-traumatic stress disorder)  F43.10       Past Psychiatric History:  schizophrenia, schizoaffective disorder, bipolar disorder, tobacco use, and PTSD   Past Medical History:  Past Medical History:  Diagnosis Date   Acute urinary retention    Anxiety    Brain injury ([place]) 8338   Complication of anesthesia 08/29/13   difficulty due to large tongue, anterior larynx and limited opening   Difficult intubation 08/31/13   Head injury 1975   "Brain Stem Contusion"   Manic depression ([place])    Pelvic fracture ([place]) 09/21/13   Inferior Pubic Ramus, Left Superior Ramus   Schizo affective schizophrenia ([place])    Schizophrenia ([place])     Past Surgical  History:  Procedure Laterality Date   COSMETIC SURGERY Left 1975   created new ear lobe   DRESSING CHANGE UNDER ANESTHESIA Left 08/31/2013   Procedure: DRESSING CHANGE UNDER ANESTHESIA for right ankle and left thigh;  Surgeon: Rozanna Box, MD;  Location: [place];  Service: Orthopedics;  Laterality: Left;   I & D EXTREMITY Bilateral 08/30/2013   Procedure: IRRIGATION AND DEBRIDEMENT with closure Left Thigh wound, Irrigation and debridement Right Ankle ;  Surgeon: Alta Corning, MD;  Location: [place];  Service: Orthopedics;  Laterality: Bilateral;   I & D EXTREMITY Left 09/26/2013   Procedure: LEFT IRRIGATION AND DEBRIDEMENT Retta Diones;  Surgeon: Rozanna Box, MD;  Location: [place];  Service: Orthopedics;  Laterality: Left;   I & D EXTREMITY Left 10/02/2013   Procedure: IRRIGATION AND DEBRIDEMENT EXTREMITY;  Surgeon: Rozanna Box, MD;  Location: [place];  Service: Orthopedics;  Laterality: Left;   INCISION AND DRAINAGE OF WOUND Left 09/21/2013   Procedure: IRRIGATION AND DEBRIDEMENT SOFT TISSUE WOUND WITH LARGE WOUND VAC PLACEMENT;  Surgeon: Rozanna Box, MD;  Location: [place];  Service: Orthopedics;  Laterality: Left;   MANDIBLE FRACTURE SURGERY  1975   SACRO-ILIAC PINNING Left 08/31/2013   Procedure: LEFT SACRO-ILIAC PINNING;  Surgeon: Rozanna Box, MD;  Location: [place];  Service: Orthopedics;  Laterality: Left;   SKIN GRAFT     SKIN GRAFT Left 10/31/2013   left thigh      DR HANDY    SKIN SPLIT GRAFT Left 10/02/2013   Procedure: SKIN GRAFT SPLIT THICKNESS;  Surgeon: Rozanna Box, MD;  Location: [place];  Service: Orthopedics;  Laterality: Left;   SKIN SPLIT GRAFT Left 10/31/2013   Procedure: LEFT THIGH SKIN GRAFT SPLIT THICKNESS;  Surgeon: Rozanna Box, MD;  Location: [place];  Service: Orthopedics;  Laterality: Left;    Family Psychiatric History: see below  Family History:  Family History  Problem Relation Age of Onset   Diabetes Mother    Obsessive Compulsive Disorder  Father    Bone cancer Father    Colon cancer Neg Hx    Rectal cancer Neg Hx    Liver cancer Neg Hx    Pancreatic cancer Neg Hx     Social History:  Social History   Socioeconomic History   Marital status: Single    Spouse name: Not on file   Number of children: Not on file   Years of education: Not on file   Highest education level: Not on file  Occupational History   Not on file  Tobacco Use   Smoking status: Every Day    Types: Cigars   Smokeless tobacco: Never  Vaping Use   Vaping Use: Never used  Substance and Sexual Activity   Alcohol use: Yes    Alcohol/week: 6.0 standard drinks of alcohol    Types: 6 Cans of beer per week    Comment: weekly   Drug use: No  Sexual activity: Never    Comment: Quit smolikg 08/29/2013  Other Topics Concern   Not on file  Social History Narrative   Not on file   Social Determinants of Health   Financial Resource Strain: Medium Risk (03/18/2022)   Overall Financial Resource Strain (CARDIA)    Difficulty of Paying Living Expenses: Somewhat hard  Food Insecurity: No Food Insecurity (03/18/2022)   Hunger Vital Sign    Worried About Running Out of Food in the Last Year: Never true    Ran Out of Food in the Last Year: Never true  Transportation Needs: No Transportation Needs (03/18/2022)   PRAPARE - Hydrologist (Medical): No    Lack of Transportation (Non-Medical): No  Physical Activity: Sufficiently Active (03/20/2022)   Exercise Vital Sign    Days of Exercise per Week: 7 days    Minutes of Exercise per Session: 30 min  Stress: No Stress Concern Present (03/20/2022)   [place]    Feeling of Stress : Not at all  Social Connections: Socially Isolated (03/20/2022)   Social Connection and Isolation Panel [NHANES]    Frequency of Communication with Friends and Family: More than three times a week    Frequency of Social Gatherings with  Friends and Family: More than three times a week    Attends Religious Services: Never    Marine scientist or Organizations: No    Attends Archivist Meetings: Never    Marital Status: Never married    Allergies:  Allergies  Allergen Reactions   Penicillins Other (See Comments)    Unsure of reaction, had allergic reaction as a child and has not taken since then    Current Medications: Current Outpatient Medications  Medication Sig Dispense Refill   bisoprolol (ZEBETA) 5 MG tablet Take 1 tablet (5 mg total) by mouth daily. 90 tablet 1   busPIRone (BUSPAR) 15 MG tablet Take 1 tablet (15 mg total) by mouth 3 (three) times daily. 90 tablet 3   lisinopril (ZESTRIL) 5 MG tablet Take 1 tablet (5 mg total) by mouth daily. To lower blood pressure 90 tablet 1   mupirocin ointment (BACTROBAN) 2 % Apply 1 Application topically 2 (two) times daily. 60 g 0   OLANZapine (ZYPREXA) 20 MG tablet Take 1 tablet (20 mg total) by mouth at bedtime. 90 tablet 3   prazosin (MINIPRESS) 1 MG capsule Take 1 capsule (1 mg total) by mouth at bedtime. 90 capsule 3   rosuvastatin (CRESTOR) 40 MG tablet Take 1 tablet (40 mg total) by mouth daily. 90 tablet 3   No current facility-administered medications for this visit.    ROS: Review of Systems  Constitutional:  Negative for activity change, appetite change, fatigue and fever.  HENT:  Negative for congestion.   Cardiovascular:  Negative for chest pain.  Gastrointestinal:  Negative for abdominal pain.  Genitourinary:  Negative for dysuria and enuresis.  Musculoskeletal:  Negative for back pain, gait problem and joint swelling.  Neurological:  Negative for syncope, speech difficulty, light-headedness, numbness and headaches.  Psychiatric/Behavioral:  Negative for agitation, behavioral problems, confusion, decreased concentration, dysphoric mood and hallucinations. The patient is not nervous/anxious and is not hyperactive.      Objective:  Psychiatric Specialty Exam: Blood pressure (!) 117/58, pulse 74, weight 196 lb 12.8 oz (89.3 kg).Body mass index is 24.93 kg/m.  General Appearance: Disheveled and malodorous  Eye Contact:  Good  Speech:  Normal Rate  Volume:  Normal  Mood:  Euthymic  Affect:  Flat  Thought Process:  Coherent, Goal Directed, and Linear  Orientation:  Full (Time, Place, and Person)  Thought Content: Logical   Suicidal Thoughts:  No  Homicidal Thoughts:  No  Memory:  Remote;   Good  Judgment:  Good  Insight:  Good  Psychomotor Activity:  Normal  Concentration:  Concentration: Good and Attention Span: Good              Assets:  Communication Skills Desire for Improvement Financial Resources/Insurance Housing Leisure Time Physical Health Resilience Talents/Skills Transportation  ADL's:  Intact  Cognition: WNL  Sleep:  Good   PE: General: well-appearing; no acute distress  Pulm: no increased work of breathing on room air  Strength & Muscle Tone: within normal limits. Limited right shoulder ROM due to "broken shoulder" Neuro: no focal neurological deficits observed  Gait & Station: normal  Metabolic Disorder Labs: Lab Results  Component Value Date   HGBA1C 5.6 01/02/2022   No results found for: "PROLACTIN" Lab Results  Component Value Date   CHOL 150 05/22/2022   TRIG 120 05/22/2022   HDL 50 05/22/2022   CHOLHDL 3.0 05/22/2022   LDLCALC 79 05/22/2022   LDLCALC 108 (H) 01/02/2022   Lab Results  Component Value Date   TSH 1.250 10/23/2020    Therapeutic Level Labs: No results found for: "LITHIUM" No results found for: "VALPROATE" No results found for: "CBMZ"  Screenings: [place] Office Visit from 08/18/2022 in [place] Video Visit from 06/01/2022 in [facility] Office Visit from 05/18/2022 in [place] Office Visit from 01/02/2022 in  [place] Office Visit from 12/12/2021 in [facility]  Total GAD-7 Score 0 4 1 1 3       PHQ2-9    Flowsheet Row Clinical Support from 08/27/2022 in [facility] Office Visit from 08/18/2022 in [place] Video Visit from 06/01/2022 in [facility] Office Visit from 05/18/2022 in [place] from 03/20/2022 in [place]  PHQ-2 Total Score 0 0 2 0 0  PHQ-9 Total Score -- 0 5 2 --      [place] Office Visit from 12/12/2021 in [place] from 12/12/2020 in [place] from 09/17/2020 in [place] No Risk No Risk No Risk       Collaboration of Care: Collaboration of Care:   Patient/Guardian was advised Release of Information must be obtained prior to any record release in order to collaborate their care with an outside provider. Patient/Guardian was advised if they have not already done so to contact the registration department to sign all necessary forms in order for [place] to release information regarding their care.   Consent: Patient/Guardian gives verbal consent for treatment and assignment of benefits for services provided during this visit. Patient/Guardian expressed understanding and agreed to proceed.   A total of 35 minutes was spent involved in face to face clinical care, chart review, and documentation.   France Ravens, MD 08/27/2022, 3:36 PM

## 2022-08-27 ENCOUNTER — Ambulatory Visit (INDEPENDENT_AMBULATORY_CARE_PROVIDER_SITE_OTHER): Payer: Medicare Other | Admitting: Student

## 2022-08-27 ENCOUNTER — Encounter (HOSPITAL_COMMUNITY): Payer: Self-pay | Admitting: Student

## 2022-08-27 VITALS — BP 117/58 | HR 74 | Wt 196.8 lb

## 2022-08-27 DIAGNOSIS — F209 Schizophrenia, unspecified: Secondary | ICD-10-CM

## 2022-08-27 DIAGNOSIS — N400 Enlarged prostate without lower urinary tract symptoms: Secondary | ICD-10-CM | POA: Diagnosis not present

## 2022-08-27 DIAGNOSIS — F419 Anxiety disorder, unspecified: Secondary | ICD-10-CM | POA: Diagnosis not present

## 2022-08-27 DIAGNOSIS — F1021 Alcohol dependence, in remission: Secondary | ICD-10-CM

## 2022-08-27 DIAGNOSIS — F4310 Post-traumatic stress disorder, unspecified: Secondary | ICD-10-CM

## 2022-08-27 MED ORDER — PRAZOSIN HCL 1 MG PO CAPS: 1.0000 mg | ORAL_CAPSULE | Freq: Every day | ORAL | 3 refills | Status: DC

## 2022-08-27 MED ORDER — OLANZAPINE 20 MG PO TABS: 20.0000 mg | ORAL_TABLET | Freq: Every day | ORAL | 3 refills | Status: DC

## 2022-08-27 MED ORDER — BUSPIRONE HCL 15 MG PO TABS: 15.0000 mg | ORAL_TABLET | Freq: Three times a day (TID) | ORAL | 3 refills | Status: DC

## 2022-08-27 NOTE — Patient Instructions (Signed)
[employer]  To become a [employer] member, please call our intake/referral department at [phone] Ext [phone], Monday through Friday, 8:00 am to 4:00 pm, EST. Calls are returned within 24 business hours.  The intake process includes a brief phone screening and scheduling an in-person clinical assessment

## 2022-09-03 NOTE — Telephone Encounter (Signed)
Call placed to [facility], [place] [facility]/ [place] to inquire if a CCA has been obtained and if the patient is ready to have a TCL referral placed.  Message left with call back requested.

## 2022-09-14 ENCOUNTER — Ambulatory Visit
Admission: RE | Admit: 2022-09-14 | Discharge: 2022-09-14 | Disposition: A | Discharge status: Home / Self Care | Payer: Medicare Other | Source: Ambulatory Visit | Attending: Nurse Practitioner | Admitting: Nurse Practitioner

## 2022-09-14 DIAGNOSIS — Z122 Encounter for screening for malignant neoplasm of respiratory organs: Secondary | ICD-10-CM

## 2022-09-14 DIAGNOSIS — F17210 Nicotine dependence, cigarettes, uncomplicated: Secondary | ICD-10-CM

## 2022-09-15 ENCOUNTER — Other Ambulatory Visit: Payer: Self-pay | Admitting: Nurse Practitioner

## 2022-09-15 DIAGNOSIS — R911 Solitary pulmonary nodule: Secondary | ICD-10-CM

## 2022-09-17 ENCOUNTER — Telehealth: Payer: Self-pay | Admitting: Emergency Medicine

## 2022-09-17 NOTE — Telephone Encounter (Signed)
Call returned. Patient identified by name and date of birth.  Patient aware of results and voiced understanding.

## 2022-09-17 NOTE — Telephone Encounter (Signed)
Copied from [place]. Topic: General - Inquiry >> Sep 17, 2022 12:50 PM Rosanne Ashing P wrote: Reason for CRM: pt called asking results back from his test yet  please advise   [phone]

## 2022-09-29 ENCOUNTER — Ambulatory Visit: Payer: Medicare Other | Attending: Internal Medicine | Admitting: Internal Medicine

## 2022-09-29 ENCOUNTER — Encounter: Payer: Self-pay | Admitting: Internal Medicine

## 2022-09-29 VITALS — BP 112/60 | HR 70 | Ht 74.0 in | Wt 198.0 lb

## 2022-09-29 DIAGNOSIS — I700 Atherosclerosis of aorta: Secondary | ICD-10-CM | POA: Insufficient documentation

## 2022-09-29 DIAGNOSIS — Z720 Tobacco use: Secondary | ICD-10-CM | POA: Diagnosis present

## 2022-09-29 DIAGNOSIS — J42 Unspecified chronic bronchitis: Secondary | ICD-10-CM | POA: Insufficient documentation

## 2022-09-29 DIAGNOSIS — I10 Essential (primary) hypertension: Secondary | ICD-10-CM

## 2022-09-29 DIAGNOSIS — E782 Mixed hyperlipidemia: Secondary | ICD-10-CM | POA: Diagnosis present

## 2022-09-29 NOTE — Patient Instructions (Signed)
Medication Instructions:  Your physician recommends that you continue on your current medications as directed. Please refer to the Current Medication list given to you today.  *If you need a refill on your cardiac medications before your next appointment, please call your pharmacy*  Lab Work: TODAY: Lipids, ALT If you have labs (blood work) drawn today and your tests are completely normal, you will receive your results only by: [place] (if you have MyChart) OR A paper copy in the mail If you have any lab test that is abnormal or we need to change your treatment, we will call you to review the results.  Testing/Procedures: None  Follow-Up: At [facility], you and your health needs are our priority.  As part of our continuing mission to provide you with exceptional heart care, we have created designated Provider Care Teams.  These Care Teams include your primary Cardiologist (physician) and Advanced Practice Providers (APPs -  Physician Assistants and Nurse Practitioners) who all work together to provide you with the care you need, when you need it.  Your next appointment:   1 year(s)  Provider:   Nicholes Rough, PA-C, Melina Copa, PA-C, Ambrose Pancoast, NP, Christen Bame, NP, or Richardson Dopp, PA-C     Then, Werner Lean, MD will plan to see you again in 2 year(s).
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
## 2022-09-30 LAB — ALT: ALT: 22 IU/L (ref 0–44)

## 2022-09-30 LAB — LIPID PANEL
Chol/HDL Ratio: 3.8 ratio (ref 0.0–5.0)
Cholesterol, Total: 180 mg/dL (ref 100–199)
HDL: 47 mg/dL (ref >39)
LDL Chol Calc (NIH): 100 mg/dL — ABNORMAL HIGH (ref 0–99)
Triglycerides: 191 mg/dL — ABNORMAL HIGH (ref 0–149)
VLDL Cholesterol Cal: 33 mg/dL (ref 5–40)

## 2022-10-05 ENCOUNTER — Telehealth: Payer: Self-pay

## 2022-10-05 DIAGNOSIS — E782 Mixed hyperlipidemia: Secondary | ICD-10-CM

## 2022-10-05 DIAGNOSIS — I700 Atherosclerosis of aorta: Secondary | ICD-10-CM

## 2022-10-05 MED ORDER — EZETIMIBE 10 MG PO TABS: 10.0000 mg | ORAL_TABLET | Freq: Every day | ORAL | 3 refills | Status: DC

## 2022-10-05 NOTE — Telephone Encounter (Signed)
The patient has been notified of the result and verbalized understanding.  All questions (if any) were answered. Precious Gilding, RN 10/05/2022 3:20 PM   Will come in for FLP on 01/15/23.  Appointment reminder sent to address on file.

## 2022-10-05 NOTE — Telephone Encounter (Signed)
-----   Message from Werner Lean, MD sent at 09/30/2022 11:48 AM EST ----- Results: LDL slightly above goal Plan: Offer Zetia 10 mg  Re-offer social work consult if he wishes  Werner Lean, MD

## 2022-10-25 ENCOUNTER — Other Ambulatory Visit: Payer: Self-pay

## 2022-10-25 ENCOUNTER — Emergency Department (HOSPITAL_COMMUNITY)
Admission: EM | Admit: 2022-10-25 | Discharge: 2022-10-25 | Disposition: A | Discharge status: Home / Self Care | Payer: Medicare Other | Attending: Emergency Medicine | Admitting: Emergency Medicine

## 2022-10-25 ENCOUNTER — Encounter (HOSPITAL_COMMUNITY): Payer: Self-pay

## 2022-10-25 DIAGNOSIS — Z5902 Unsheltered homelessness: Secondary | ICD-10-CM | POA: Diagnosis not present

## 2022-10-25 DIAGNOSIS — M25512 Pain in left shoulder: Secondary | ICD-10-CM | POA: Diagnosis present

## 2022-10-25 DIAGNOSIS — G8929 Other chronic pain: Secondary | ICD-10-CM

## 2022-10-25 DIAGNOSIS — Z5900 Homelessness unspecified: Secondary | ICD-10-CM

## 2022-10-25 NOTE — Discharge Instructions (Addendum)
1.  Continue to work with your primary care doctors and your social worker for your medical care and housing needs. 2.  You may take over-the-counter acetaminophen or use over-the-counter pain patches such as Salonpas on your shoulder.

## 2022-10-25 NOTE — ED Provider Notes (Signed)
[place] EMERGENCY DEPARTMENT AT [facility] Provider Note   CSN: [id number] Arrival date & time: 10/25/22  1459     History  Chief Complaint  Patient presents with   Shoulder Pain    left    [name] is a 72 y.o. male.  HPI Patient reports that he has pain in his left shoulder.  He reports however is been present for over 2 years and has been told he needs a shoulder replacement.  No new or acute injuries.  Patient reports he needs to be admitted for his shoulder because he does not have a place to live anymore.  He reports he ran out of money to pay his hotel bill.  He reports he does not do well if he has to live on the street and cannot keep up with his medications that he really needs.    Home Medications Prior to Admission medications   Medication Sig Start Date End Date Taking? Authorizing Provider  bisoprolol (ZEBETA) 5 MG tablet Take 1 tablet (5 mg total) by mouth daily. 07/13/22   Gildardo Pounds, NP  busPIRone (BUSPAR) 15 MG tablet Take 1 tablet (15 mg total) by mouth 3 (three) times daily. 08/27/22   France Ravens, MD  ezetimibe (ZETIA) 10 MG tablet Take 1 tablet (10 mg total) by mouth daily. 10/05/22   Chandrasekhar, Mahesh A, MD  lisinopril (ZESTRIL) 5 MG tablet Take 1 tablet (5 mg total) by mouth daily. To lower blood pressure 08/18/22   Gildardo Pounds, NP  mupirocin ointment (BACTROBAN) 2 % Apply 1 Application topically 2 (two) times daily. 05/18/22   Gildardo Pounds, NP  OLANZapine (ZYPREXA) 20 MG tablet Take 1 tablet (20 mg total) by mouth at bedtime. 08/27/22   France Ravens, MD  prazosin (MINIPRESS) 1 MG capsule Take 1 capsule (1 mg total) by mouth at bedtime. 08/27/22   France Ravens, MD  rosuvastatin (CRESTOR) 40 MG tablet Take 1 tablet (40 mg total) by mouth daily. 03/17/22   Finis Bud, NP      Allergies    Penicillins    Review of Systems   Review of Systems  Physical Exam Updated Vital Signs BP 112/60 (BP Location: Right Arm)   Pulse  74   Temp 97.6 F (36.4 C) (Oral)   Resp 18   Ht 6\' 2"  (1.88 m)   Wt 86.2 kg   SpO2 100%   BMI 24.39 kg/m  Physical Exam HENT:     Head: Normocephalic and atraumatic.     Mouth/Throat:     Pharynx: Oropharynx is clear.  Eyes:     Extraocular Movements: Extraocular movements intact.  Cardiovascular:     Rate and Rhythm: Normal rate.     Pulses: Normal pulses.  Pulmonary:     Effort: Pulmonary effort is normal.  Musculoskeletal:     Comments: Patient has intact range of motion of the left shoulder.  No deformities.  He can use the upper extremity and forward flex and extend at the shoulder.  Pulses 2+ and strong.  Hand is warm and dry.  No peripheral edema.  Calves soft and nontender.  Skin:    General: Skin is warm and dry.  Neurological:     Comments: Patient has no focal motor deficits.  He is spontaneously using all extremities in coordinated fashion.  Psychiatric:     Comments: Patient's mood is even.  He is not showing any signs of hallucination or acute psychosis.  ED Results / Procedures / Treatments   Labs (all labs ordered are listed, but only abnormal results are displayed) Labs Reviewed - No data to display  EKG None  Radiology No results found.  Procedures Procedures    Medications Ordered in ED Medications - No data to display  ED Course/ Medical Decision Making/ A&P                             Medical Decision Making  Patient presents initially with complaint of left shoulder pain.  He explains that the pain is very longstanding and no acute or new injuries.  He has intact range of motion and no evidence of acute injury or neurovascular dysfunction.  Patient elaborates that he has run out of money for his housing at the hotel and needs to be admitted to the hospital for this reason.  This time I have advised the patient that there is no indication for admission under these circumstances.  Currently I have included resource guide and financial  assistance guide in discharge instructions.  Has had assistance from [facility] and social work for housing.        Final Clinical Impression(s) / ED Diagnoses Final diagnoses:  Chronic left shoulder pain  Homeless    Rx / DC Orders ED Discharge Orders     None         Charlesetta Shanks, MD 10/25/22 1642

## 2022-10-25 NOTE — ED Notes (Signed)
Pt is homeless, pt offered bus voucher to get back to motel where belongings are. Pt refused voucher and states to call PD to give him a ride to the motel and that he is not leaving. Agricultural consultant, Neurosurgeon notified

## 2022-10-25 NOTE — ED Triage Notes (Signed)
Arrives via EMS, pt is homeless. Pt is c/o chronic left shoulder pain that got worse when he "no longer had a hotel room to stay in" Pt is A&ox4 on arrival and ambulatory with EMS

## 2022-10-26 ENCOUNTER — Encounter: Payer: Self-pay | Admitting: Licensed Clinical Social Worker

## 2022-10-27 NOTE — Patient Outreach (Signed)
  Care Coordination   Follow Up/Collaboration Visit Note   10/26/22 Name: KARY FRONTINO MRN: [id number] DOB: 11-Oct-1950  DAMETRIUS HAGOPIAN is a 72 y.o. year old male who sees Gildardo Pounds, NP for primary care. I  spoke with [facility] RNCM regarding patient care and resources provided in the past to assist with housing  What matters to the patients health and wellness today?  Patient was not engaged during this encounter. LCSW left message for pt to return call   SDOH assessments and interventions completed:  No     Care Coordination Interventions:  Yes, provided  Interventions Today    Flowsheet Row Most Recent Value  Chronic Disease   Chronic disease during today's visit Hypertension (HTN), Other  [PTSD and Schizophrenia]  General Interventions   General Interventions Discussed/Reviewed Communication with  Communication with RN  [facility] RNCM]       Follow up plan: Follow up call scheduled for 1 week    Encounter Outcome:  Pt. Visit Completed   Christa See, MSW, [email or web address] Phone [phone] 12:58 PM

## 2022-11-05 ENCOUNTER — Emergency Department (HOSPITAL_COMMUNITY)
Admission: EM | Admit: 2022-11-05 | Discharge: 2022-11-05 | Disposition: A | Discharge status: Home / Self Care | Payer: Medicare Other | Attending: Emergency Medicine | Admitting: Emergency Medicine

## 2022-11-05 ENCOUNTER — Emergency Department (HOSPITAL_COMMUNITY): Payer: Medicare Other

## 2022-11-05 DIAGNOSIS — Y92481 Parking lot as the place of occurrence of the external cause: Secondary | ICD-10-CM | POA: Diagnosis not present

## 2022-11-05 DIAGNOSIS — S4992XA Unspecified injury of left shoulder and upper arm, initial encounter: Secondary | ICD-10-CM | POA: Diagnosis present

## 2022-11-05 DIAGNOSIS — R519 Headache, unspecified: Secondary | ICD-10-CM | POA: Diagnosis not present

## 2022-11-05 DIAGNOSIS — W0110XA Fall on same level from slipping, tripping and stumbling with subsequent striking against unspecified object, initial encounter: Secondary | ICD-10-CM | POA: Diagnosis not present

## 2022-11-05 DIAGNOSIS — S0081XA Abrasion of other part of head, initial encounter: Secondary | ICD-10-CM | POA: Insufficient documentation

## 2022-11-05 DIAGNOSIS — S0990XA Unspecified injury of head, initial encounter: Secondary | ICD-10-CM

## 2022-11-05 DIAGNOSIS — S0512XA Contusion of eyeball and orbital tissues, left eye, initial encounter: Secondary | ICD-10-CM | POA: Insufficient documentation

## 2022-11-05 DIAGNOSIS — S4292XP Fracture of left shoulder girdle, part unspecified, subsequent encounter for fracture with malunion: Secondary | ICD-10-CM

## 2022-11-05 DIAGNOSIS — S0001XA Abrasion of scalp, initial encounter: Secondary | ICD-10-CM | POA: Insufficient documentation

## 2022-11-05 DIAGNOSIS — W19XXXA Unspecified fall, initial encounter: Secondary | ICD-10-CM

## 2022-11-05 DIAGNOSIS — S42212A Unspecified displaced fracture of surgical neck of left humerus, initial encounter for closed fracture: Secondary | ICD-10-CM | POA: Diagnosis not present

## 2022-11-05 NOTE — ED Notes (Signed)
Pt able to ambulate with stand by assist approx 50 ft then back to room, pt shuffle his feet at times, over all steady gait noted. Dr. Wyvonnia Dusky notified.

## 2022-11-05 NOTE — Discharge Instructions (Signed)
Your CT scan today shows no acute injury.  Your shoulder fracture looks worse than last year and has moved.  You should follow-up with Dr. Ninfa Linden to discuss surgery to repair this.  Wear the sling.  Return to the ED with new or worsening symptoms.
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
## 2022-11-05 NOTE — Progress Notes (Signed)
Orthopedic Tech Progress Note Patient Details:  [name] 05/30/1951 [id number]  Patient ID: [name], male   DOB: 03-14-1951, 72 y.o.   MRN: [id number] RN notified that ortho techs do not provide soft goods at night when covering both [place] and [facility]. Vernona Rieger 11/05/2022, 4:35 AM

## 2022-11-09 ENCOUNTER — Encounter (HOSPITAL_COMMUNITY): Payer: Self-pay

## 2022-11-09 ENCOUNTER — Emergency Department (HOSPITAL_COMMUNITY)
Admission: EM | Admit: 2022-11-09 | Discharge: 2022-11-09 | Disposition: A | Discharge status: Home / Self Care | Payer: Medicare Other | Source: Home / Self Care | Attending: Emergency Medicine | Admitting: Emergency Medicine

## 2022-11-09 DIAGNOSIS — A419 Sepsis, unspecified organism: Secondary | ICD-10-CM | POA: Diagnosis not present

## 2022-11-09 DIAGNOSIS — M25512 Pain in left shoulder: Secondary | ICD-10-CM | POA: Insufficient documentation

## 2022-11-09 DIAGNOSIS — R531 Weakness: Secondary | ICD-10-CM | POA: Insufficient documentation

## 2022-11-09 LAB — URINALYSIS, ROUTINE W REFLEX MICROSCOPIC
Bacteria, UA: NONE SEEN
Bilirubin Urine: NEGATIVE
Glucose, UA: NEGATIVE mg/dL
Hgb urine dipstick: NEGATIVE
Ketones, ur: NEGATIVE mg/dL
Leukocytes,Ua: NEGATIVE
Nitrite: NEGATIVE
Protein, ur: 30 mg/dL — AB
Specific Gravity, Urine: 1.026 (ref 1.005–1.030)
pH: 5 (ref 5.0–8.0)

## 2022-11-09 LAB — CBC WITH DIFFERENTIAL/PLATELET
Abs Immature Granulocytes: 0.07 10*3/uL (ref 0.00–0.07)
Basophils Absolute: 0 10*3/uL (ref 0.0–0.1)
Basophils Relative: 0 %
Eosinophils Absolute: 0 10*3/uL (ref 0.0–0.5)
Eosinophils Relative: 0 %
HCT: 42 % (ref 39.0–52.0)
Hemoglobin: 13.5 g/dL (ref 13.0–17.0)
Immature Granulocytes: 1 %
Lymphocytes Relative: 11 %
Lymphs Abs: 1.5 10*3/uL (ref 0.7–4.0)
MCH: 30.1 pg (ref 26.0–34.0)
MCHC: 32.1 g/dL (ref 30.0–36.0)
MCV: 93.5 fL (ref 80.0–100.0)
Monocytes Absolute: 1.3 10*3/uL — ABNORMAL HIGH (ref 0.1–1.0)
Monocytes Relative: 10 %
Neutro Abs: 10.8 10*3/uL — ABNORMAL HIGH (ref 1.7–7.7)
Neutrophils Relative %: 78 %
Platelets: 346 10*3/uL (ref 150–400)
RBC: 4.49 MIL/uL (ref 4.22–5.81)
RDW: 13.8 % (ref 11.5–15.5)
WBC: 13.7 10*3/uL — ABNORMAL HIGH (ref 4.0–10.5)
nRBC: 0 % (ref 0.0–0.2)

## 2022-11-09 LAB — COMPREHENSIVE METABOLIC PANEL
ALT: 21 U/L (ref 0–44)
AST: 37 U/L (ref 15–41)
Albumin: 2.7 g/dL — ABNORMAL LOW (ref 3.5–5.0)
Alkaline Phosphatase: 48 U/L (ref 38–126)
Anion gap: 9 (ref 5–15)
BUN: 17 mg/dL (ref 8–23)
CO2: 27 mmol/L (ref 22–32)
Calcium: 8.4 mg/dL — ABNORMAL LOW (ref 8.9–10.3)
Chloride: 98 mmol/L (ref 98–111)
Creatinine, Ser: 0.85 mg/dL (ref 0.61–1.24)
GFR, Estimated: >60 mL/min (ref >60)
Glucose, Bld: 84 mg/dL (ref 70–99)
Potassium: 4.1 mmol/L (ref 3.5–5.1)
Sodium: 134 mmol/L — ABNORMAL LOW (ref 135–145)
Total Bilirubin: 1.9 mg/dL — ABNORMAL HIGH (ref 0.3–1.2)
Total Protein: 6.2 g/dL — ABNORMAL LOW (ref 6.5–8.1)

## 2022-11-09 MED ORDER — LACTATED RINGERS IV BOLUS
1000.0000 mL | Freq: Once | INTRAVENOUS | Status: AC
  Administered 2022-11-09: 1000 mL via INTRAVENOUS

## 2022-11-09 MED ORDER — LACTATED RINGERS IV SOLN: INTRAVENOUS | Status: DC
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
## 2022-11-09 NOTE — ED Notes (Signed)
Called lab about patient urine, states 2/3 mins to result

## 2022-11-09 NOTE — ED Provider Notes (Signed)
Patient seen after prior EDP - Dr. Freida Busman.  Screening labs obtained are without significant abnormality.  Patient feeling much improved after having something to eat and drink.  Patient is agreeable with plan for discharge.  He plans on returning to the [facility].    Importance of close follow-up is stressed.  Strict return precautions given and understood.     Wynetta Fines, MD 11/09/22 [phone]
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
## 2022-11-09 NOTE — Discharge Instructions (Addendum)
Return for any problem.  ?

## 2022-11-10 ENCOUNTER — Emergency Department (HOSPITAL_COMMUNITY): Payer: Medicare Other

## 2022-11-10 ENCOUNTER — Other Ambulatory Visit: Payer: Self-pay

## 2022-11-10 ENCOUNTER — Inpatient Hospital Stay (HOSPITAL_COMMUNITY): Payer: Medicare Other

## 2022-11-10 ENCOUNTER — Encounter (HOSPITAL_COMMUNITY): Payer: Self-pay | Admitting: Emergency Medicine

## 2022-11-10 ENCOUNTER — Inpatient Hospital Stay (HOSPITAL_COMMUNITY)
Admission: EM | Admit: 2022-11-10 | Discharge: 2022-11-26 | DRG: 853 | Disposition: A | Discharge status: Skilled Nursing Facility | Payer: Medicare Other | Attending: Internal Medicine | Admitting: Internal Medicine

## 2022-11-10 DIAGNOSIS — L89322 Pressure ulcer of left buttock, stage 2: Secondary | ICD-10-CM | POA: Diagnosis present

## 2022-11-10 DIAGNOSIS — Z79899 Other long term (current) drug therapy: Secondary | ICD-10-CM

## 2022-11-10 DIAGNOSIS — W19XXXA Unspecified fall, initial encounter: Secondary | ICD-10-CM

## 2022-11-10 DIAGNOSIS — N179 Acute kidney failure, unspecified: Secondary | ICD-10-CM | POA: Diagnosis present

## 2022-11-10 DIAGNOSIS — E162 Hypoglycemia, unspecified: Secondary | ICD-10-CM | POA: Diagnosis present

## 2022-11-10 DIAGNOSIS — T426X5A Adverse effect of other antiepileptic and sedative-hypnotic drugs, initial encounter: Secondary | ICD-10-CM | POA: Diagnosis present

## 2022-11-10 DIAGNOSIS — Z789 Other specified health status: Secondary | ICD-10-CM | POA: Diagnosis present

## 2022-11-10 DIAGNOSIS — F1090 Alcohol use, unspecified, uncomplicated: Secondary | ICD-10-CM | POA: Diagnosis present

## 2022-11-10 DIAGNOSIS — J9602 Acute respiratory failure with hypercapnia: Secondary | ICD-10-CM | POA: Diagnosis not present

## 2022-11-10 DIAGNOSIS — F259 Schizoaffective disorder, unspecified: Secondary | ICD-10-CM | POA: Diagnosis present

## 2022-11-10 DIAGNOSIS — L03317 Cellulitis of buttock: Secondary | ICD-10-CM | POA: Diagnosis present

## 2022-11-10 DIAGNOSIS — G928 Other toxic encephalopathy: Secondary | ICD-10-CM | POA: Diagnosis not present

## 2022-11-10 DIAGNOSIS — E871 Hypo-osmolality and hyponatremia: Secondary | ICD-10-CM | POA: Diagnosis present

## 2022-11-10 DIAGNOSIS — J9601 Acute respiratory failure with hypoxia: Secondary | ICD-10-CM | POA: Diagnosis not present

## 2022-11-10 DIAGNOSIS — E8809 Other disorders of plasma-protein metabolism, not elsewhere classified: Secondary | ICD-10-CM | POA: Diagnosis present

## 2022-11-10 DIAGNOSIS — R296 Repeated falls: Secondary | ICD-10-CM | POA: Diagnosis present

## 2022-11-10 DIAGNOSIS — J690 Pneumonitis due to inhalation of food and vomit: Secondary | ICD-10-CM | POA: Diagnosis not present

## 2022-11-10 DIAGNOSIS — L98419 Non-pressure chronic ulcer of buttock with unspecified severity: Secondary | ICD-10-CM | POA: Diagnosis not present

## 2022-11-10 DIAGNOSIS — N401 Enlarged prostate with lower urinary tract symptoms: Secondary | ICD-10-CM | POA: Diagnosis present

## 2022-11-10 DIAGNOSIS — R5381 Other malaise: Secondary | ICD-10-CM | POA: Diagnosis present

## 2022-11-10 DIAGNOSIS — W109XXA Fall (on) (from) unspecified stairs and steps, initial encounter: Secondary | ICD-10-CM | POA: Diagnosis present

## 2022-11-10 DIAGNOSIS — I5032 Chronic diastolic (congestive) heart failure: Secondary | ICD-10-CM | POA: Diagnosis present

## 2022-11-10 DIAGNOSIS — D6489 Other specified anemias: Secondary | ICD-10-CM | POA: Diagnosis present

## 2022-11-10 DIAGNOSIS — R1312 Dysphagia, oropharyngeal phase: Secondary | ICD-10-CM | POA: Diagnosis present

## 2022-11-10 DIAGNOSIS — L89312 Pressure ulcer of right buttock, stage 2: Secondary | ICD-10-CM | POA: Diagnosis present

## 2022-11-10 DIAGNOSIS — F17290 Nicotine dependence, other tobacco product, uncomplicated: Secondary | ICD-10-CM | POA: Diagnosis present

## 2022-11-10 DIAGNOSIS — R627 Adult failure to thrive: Secondary | ICD-10-CM | POA: Diagnosis present

## 2022-11-10 DIAGNOSIS — R338 Other retention of urine: Secondary | ICD-10-CM | POA: Diagnosis present

## 2022-11-10 DIAGNOSIS — E785 Hyperlipidemia, unspecified: Secondary | ICD-10-CM | POA: Diagnosis present

## 2022-11-10 DIAGNOSIS — I110 Hypertensive heart disease with heart failure: Secondary | ICD-10-CM | POA: Diagnosis present

## 2022-11-10 DIAGNOSIS — I5033 Acute on chronic diastolic (congestive) heart failure: Secondary | ICD-10-CM | POA: Diagnosis present

## 2022-11-10 DIAGNOSIS — Z833 Family history of diabetes mellitus: Secondary | ICD-10-CM

## 2022-11-10 DIAGNOSIS — A419 Sepsis, unspecified organism: Secondary | ICD-10-CM | POA: Diagnosis present

## 2022-11-10 DIAGNOSIS — L0390 Cellulitis, unspecified: Secondary | ICD-10-CM | POA: Insufficient documentation

## 2022-11-10 DIAGNOSIS — R339 Retention of urine, unspecified: Secondary | ICD-10-CM | POA: Diagnosis present

## 2022-11-10 DIAGNOSIS — F4310 Post-traumatic stress disorder, unspecified: Secondary | ICD-10-CM | POA: Diagnosis present

## 2022-11-10 DIAGNOSIS — L89159 Pressure ulcer of sacral region, unspecified stage: Secondary | ICD-10-CM | POA: Diagnosis present

## 2022-11-10 DIAGNOSIS — R600 Localized edema: Secondary | ICD-10-CM | POA: Diagnosis not present

## 2022-11-10 DIAGNOSIS — Z5900 Homelessness unspecified: Secondary | ICD-10-CM | POA: Diagnosis not present

## 2022-11-10 DIAGNOSIS — Z9181 History of falling: Secondary | ICD-10-CM

## 2022-11-10 DIAGNOSIS — Z87820 Personal history of traumatic brain injury: Secondary | ICD-10-CM

## 2022-11-10 LAB — CBC WITH DIFFERENTIAL/PLATELET
Abs Immature Granulocytes: 0.1 10*3/uL — ABNORMAL HIGH (ref 0.00–0.07)
Basophils Absolute: 0 10*3/uL (ref 0.0–0.1)
Basophils Relative: 0 %
Eosinophils Absolute: 0 10*3/uL (ref 0.0–0.5)
Eosinophils Relative: 0 %
HCT: 38.3 % — ABNORMAL LOW (ref 39.0–52.0)
Hemoglobin: 12.8 g/dL — ABNORMAL LOW (ref 13.0–17.0)
Immature Granulocytes: 1 %
Lymphocytes Relative: 9 %
Lymphs Abs: 1.5 10*3/uL (ref 0.7–4.0)
MCH: 31.4 pg (ref 26.0–34.0)
MCHC: 33.4 g/dL (ref 30.0–36.0)
MCV: 94.1 fL (ref 80.0–100.0)
Monocytes Absolute: 1.5 10*3/uL — ABNORMAL HIGH (ref 0.1–1.0)
Monocytes Relative: 9 %
Neutro Abs: 13.4 10*3/uL — ABNORMAL HIGH (ref 1.7–7.7)
Neutrophils Relative %: 81 %
Platelets: 342 10*3/uL (ref 150–400)
RBC: 4.07 MIL/uL — ABNORMAL LOW (ref 4.22–5.81)
RDW: 13.8 % (ref 11.5–15.5)
WBC: 16.5 10*3/uL — ABNORMAL HIGH (ref 4.0–10.5)
nRBC: 0 % (ref 0.0–0.2)

## 2022-11-10 LAB — COMPREHENSIVE METABOLIC PANEL
ALT: 23 U/L (ref 0–44)
AST: 36 U/L (ref 15–41)
Albumin: 2.4 g/dL — ABNORMAL LOW (ref 3.5–5.0)
Alkaline Phosphatase: 56 U/L (ref 38–126)
Anion gap: 15 (ref 5–15)
BUN: 18 mg/dL (ref 8–23)
CO2: 27 mmol/L (ref 22–32)
Calcium: 8.9 mg/dL (ref 8.9–10.3)
Chloride: 96 mmol/L — ABNORMAL LOW (ref 98–111)
Creatinine, Ser: 1.22 mg/dL (ref 0.61–1.24)
GFR, Estimated: >60 mL/min (ref >60)
Glucose, Bld: 72 mg/dL (ref 70–99)
Potassium: 3.8 mmol/L (ref 3.5–5.1)
Sodium: 138 mmol/L (ref 135–145)
Total Bilirubin: 1.6 mg/dL — ABNORMAL HIGH (ref 0.3–1.2)
Total Protein: 6 g/dL — ABNORMAL LOW (ref 6.5–8.1)

## 2022-11-10 LAB — PROTIME-INR
INR: 1.2 (ref 0.8–1.2)
Prothrombin Time: 14.8 seconds (ref 11.4–15.2)

## 2022-11-10 LAB — BRAIN NATRIURETIC PEPTIDE: B Natriuretic Peptide: 62.1 pg/mL (ref 0.0–100.0)

## 2022-11-10 LAB — LACTIC ACID, PLASMA: Lactic Acid, Venous: 1.4 mmol/L (ref 0.5–1.9)

## 2022-11-10 LAB — HIV ANTIBODY (ROUTINE TESTING W REFLEX): HIV Screen 4th Generation wRfx: NONREACTIVE

## 2022-11-10 LAB — CK: Total CK: 163 U/L (ref 49–397)

## 2022-11-10 MED ORDER — MEDIHONEY WOUND/BURN DRESSING EX PSTE
1.0000 | PASTE | Freq: Every day | CUTANEOUS | Status: DC
  Administered 2022-11-15 – 2022-11-26 (×13): 1 via TOPICAL
  Filled 2022-11-10 (×5): qty 44

## 2022-11-10 MED ORDER — LACTATED RINGERS IV BOLUS (SEPSIS)
1000.0000 mL | Freq: Once | INTRAVENOUS | Status: AC
  Administered 2022-11-10: 1000 mL via INTRAVENOUS

## 2022-11-10 MED ORDER — ONDANSETRON HCL 4 MG/2ML IJ SOLN: 4.0000 mg | Freq: Four times a day (QID) | INTRAMUSCULAR | Status: DC | PRN

## 2022-11-10 MED ORDER — LACTATED RINGERS IV BOLUS
1000.0000 mL | Freq: Once | INTRAVENOUS | Status: AC
  Administered 2022-11-10: 1000 mL via INTRAVENOUS

## 2022-11-10 MED ORDER — FOLIC ACID 1 MG PO TABS
1.0000 mg | ORAL_TABLET | Freq: Every day | ORAL | Status: DC
  Administered 2022-11-10: 1 mg via ORAL
  Filled 2022-11-10: qty 1

## 2022-11-10 MED ORDER — THIAMINE HCL 100 MG/ML IJ SOLN
100.0000 mg | Freq: Every day | INTRAMUSCULAR | Status: DC
  Administered 2022-11-10 – 2022-11-11 (×2): 100 mg via INTRAVENOUS
  Filled 2022-11-10 (×2): qty 2

## 2022-11-10 MED ORDER — EZETIMIBE 10 MG PO TABS
10.0000 mg | ORAL_TABLET | Freq: Every day | ORAL | Status: DC
  Administered 2022-11-10 – 2022-11-18 (×8): 10 mg via ORAL
  Filled 2022-11-10 (×9): qty 1

## 2022-11-10 MED ORDER — ONDANSETRON HCL 4 MG PO TABS: 4.0000 mg | ORAL_TABLET | Freq: Four times a day (QID) | ORAL | Status: DC | PRN

## 2022-11-10 MED ORDER — THIAMINE MONONITRATE 100 MG PO TABS: 100.0000 mg | ORAL_TABLET | Freq: Every day | ORAL | Status: DC

## 2022-11-10 MED ORDER — OLANZAPINE 5 MG PO TABS
20.0000 mg | ORAL_TABLET | Freq: Every day | ORAL | Status: DC
  Administered 2022-11-10: 20 mg via ORAL
  Filled 2022-11-10: qty 4

## 2022-11-10 MED ORDER — LACTATED RINGERS IV SOLN: INTRAVENOUS | Status: DC

## 2022-11-10 MED ORDER — LORAZEPAM 2 MG/ML IJ SOLN
1.0000 mg | INTRAMUSCULAR | Status: DC | PRN
  Administered 2022-11-11: 2 mg via INTRAVENOUS
  Filled 2022-11-10: qty 1

## 2022-11-10 MED ORDER — ROSUVASTATIN CALCIUM 20 MG PO TABS
40.0000 mg | ORAL_TABLET | Freq: Every day | ORAL | Status: DC
  Administered 2022-11-10 – 2022-11-18 (×8): 40 mg via ORAL
  Filled 2022-11-10 (×8): qty 2

## 2022-11-10 MED ORDER — SODIUM CHLORIDE 0.9 % IV SOLN
2.0000 g | INTRAVENOUS | Status: AC
  Administered 2022-11-10 – 2022-11-16 (×7): 2 g via INTRAVENOUS
  Filled 2022-11-10 (×7): qty 20

## 2022-11-10 MED ORDER — ENOXAPARIN SODIUM 40 MG/0.4ML IJ SOSY
40.0000 mg | PREFILLED_SYRINGE | INTRAMUSCULAR | Status: DC
  Administered 2022-11-10 – 2022-11-25 (×16): 40 mg via SUBCUTANEOUS
  Filled 2022-11-10 (×16): qty 0.4

## 2022-11-10 MED ORDER — BUSPIRONE HCL 5 MG PO TABS
15.0000 mg | ORAL_TABLET | Freq: Three times a day (TID) | ORAL | Status: DC
  Administered 2022-11-10 (×2): 15 mg via ORAL
  Filled 2022-11-10: qty 2
  Filled 2022-11-10: qty 3

## 2022-11-10 MED ORDER — LORAZEPAM 1 MG PO TABS
1.0000 mg | ORAL_TABLET | ORAL | Status: DC | PRN
  Administered 2022-11-10: 1 mg via ORAL
  Filled 2022-11-10: qty 1

## 2022-11-10 MED ORDER — FENTANYL CITRATE PF 50 MCG/ML IJ SOSY
50.0000 ug | PREFILLED_SYRINGE | Freq: Once | INTRAMUSCULAR | Status: AC
  Administered 2022-11-10: 50 ug via INTRAVENOUS
  Filled 2022-11-10: qty 1

## 2022-11-10 MED ORDER — PRAZOSIN HCL 1 MG PO CAPS
1.0000 mg | ORAL_CAPSULE | Freq: Every day | ORAL | Status: DC
  Administered 2022-11-10: 1 mg via ORAL
  Filled 2022-11-10: qty 1

## 2022-11-10 MED ORDER — DAKINS (1/4 STRENGTH) 0.125 % EX SOLN
Freq: Two times a day (BID) | CUTANEOUS | Status: AC
  Administered 2022-11-12: 2
  Filled 2022-11-10: qty 473

## 2022-11-10 MED ORDER — BISOPROLOL FUMARATE 5 MG PO TABS
5.0000 mg | ORAL_TABLET | Freq: Every day | ORAL | Status: DC
  Administered 2022-11-10 – 2022-11-12 (×2): 5 mg via ORAL
  Filled 2022-11-10 (×4): qty 1

## 2022-11-10 MED ORDER — ADULT MULTIVITAMIN W/MINERALS CH
1.0000 | ORAL_TABLET | Freq: Every day | ORAL | Status: DC
  Administered 2022-11-10 – 2022-11-18 (×8): 1 via ORAL
  Filled 2022-11-10 (×8): qty 1

## 2022-11-10 NOTE — Progress Notes (Signed)
[facility] will follow per sepsis protocol  

## 2022-11-10 NOTE — H&P (Signed)
History and Physical    [name] BeachMichael G Forget ZOX:[id number] DOB: June 15, 1951 DOA: 11/10/2022  PCP: Claiborne RiggFleming, Zelda W, NP (Confirm with patient/family/NH records and if not entered, this [facility]) Patient coming from: Home  I have personally briefly reviewed patient's [facility]  Chief Complaint: Feeling weak  HPI: [name] is a 72 y.o. male with medical history significant of alcohol abuse, HTN and chronic HFpEF, schizophrenia, anxiety/depression, homeless, presented with generalized weakness and frequent falls.  Patient reported that for the last 2 to 3 days has been feeling generalized weakness, with frequent falls and as a result he has been very much bedbound for the last 2 days and not been eating or drinking much, further made him very weak.  He came to the ED for the first time last night for multiple complaints including cough bilateral shoulder pain bilateral leg weakness, and wound on his buttocks.  Workup found patient afebrile, tachycardia, blood pressure borderline low nonhypoxic.  Trauma scan head CT and cervical spine CT negative for acute findings chest x-ray no acute infiltrates, blood work showed WBC 13.7 trending to 16.5 this afternoon, creatinine 1.2, K3.8 bicarb 22 glucose 72.  UA showed no significant UTI.  Exam showed large bilateral buttocks ulcers with signs of infection and cellulitis surrounding area and patient was started on ceftriaxone.  Review of Systems: As per HPI otherwise 14 point review of systems negative.   Past Medical History:  Diagnosis Date   Acute urinary retention    Anxiety    Brain injury 1975   Complication of anesthesia 08/29/13   difficulty due to large tongue, anterior larynx and limited opening   Difficult intubation 08/31/13   Head injury 1975   "Brain Stem Contusion"   Manic depression    Pelvic fracture 09/21/13   Inferior Pubic Ramus, Left Superior Ramus   Schizo affective  schizophrenia    Schizophrenia     Past Surgical History:  Procedure Laterality Date   COSMETIC SURGERY Left 1975   created new ear lobe   DRESSING CHANGE UNDER ANESTHESIA Left 08/31/2013   Procedure: DRESSING CHANGE UNDER ANESTHESIA for right ankle and left thigh;  Surgeon: Budd PalmerMichael H Handy, MD;  Location: [facility] OR;  Service: Orthopedics;  Laterality: Left;   I & D EXTREMITY Bilateral 08/30/2013   Procedure: IRRIGATION AND DEBRIDEMENT with closure Left Thigh wound, Irrigation and debridement Right Ankle ;  Surgeon: Harvie JuniorJohn L Graves, MD;  Location: [facility] OR;  Service: Orthopedics;  Laterality: Bilateral;   I & D EXTREMITY Left 09/26/2013   Procedure: LEFT IRRIGATION AND DEBRIDEMENT Robby SermonEXTREMITY/WOUND VAC;  Surgeon: Budd PalmerMichael H Handy, MD;  [facility];  Service: Orthopedics;  Laterality: Left;   I & D EXTREMITY Left 10/02/2013   Procedure: IRRIGATION AND DEBRIDEMENT EXTREMITY;  Surgeon: Budd PalmerMichael H Handy, MD;  Location: [facility] OR;  Service: Orthopedics;  Laterality: Left;   INCISION AND DRAINAGE OF WOUND Left 09/21/2013   Procedure: IRRIGATION AND DEBRIDEMENT SOFT TISSUE WOUND WITH LARGE WOUND VAC PLACEMENT;  Surgeon: Budd PalmerMichael H Handy, MD;  Location: [facility] OR;  Service: Orthopedics;  Laterality: Left;   MANDIBLE FRACTURE SURGERY  1975   SACRO-ILIAC PINNING Left 08/31/2013   Procedure: LEFT SACRO-ILIAC PINNING;  Surgeon: Budd PalmerMichael H Handy, MD;  [facility];  Service: Orthopedics;  Laterality: Left;   SKIN GRAFT     SKIN GRAFT Left 10/31/2013   left thigh      DR HANDY  SKIN SPLIT GRAFT Left 10/02/2013   Procedure: SKIN GRAFT SPLIT THICKNESS;  Surgeon: Budd Palmer, MD;  [facility];  Service: Orthopedics;  Laterality: Left;   SKIN SPLIT GRAFT Left 10/31/2013   Procedure: LEFT THIGH SKIN GRAFT SPLIT THICKNESS;  Surgeon: Budd Palmer, MD;  Location: [facility] OR;  Service: Orthopedics;  Laterality: Left;     reports that he has been smoking cigars. He has never used smokeless tobacco. He reports current alcohol use  of about 6.0 standard drinks of alcohol per week. He reports that he does not use drugs.  Allergies  Allergen Reactions   Penicillins Other (See Comments)    Unsure of reaction, had allergic reaction as a child and has not taken since then    Family History  Problem Relation Age of Onset   Diabetes Mother    Obsessive Compulsive Disorder Father    Bone cancer Father    Colon cancer Neg Hx    Rectal cancer Neg Hx    Liver cancer Neg Hx    Pancreatic cancer Neg Hx      Prior to Admission medications   Medication Sig Start Date End Date Taking? Authorizing Provider  bisoprolol (ZEBETA) 5 MG tablet Take 1 tablet (5 mg total) by mouth daily. 07/13/22   Claiborne Rigg, NP  busPIRone (BUSPAR) 15 MG tablet Take 1 tablet (15 mg total) by mouth 3 (three) times daily. 08/27/22   Park Pope, MD  ezetimibe (ZETIA) 10 MG tablet Take 1 tablet (10 mg total) by mouth daily. 10/05/22   Chandrasekhar, Mahesh A, MD  lisinopril (ZESTRIL) 5 MG tablet Take 1 tablet (5 mg total) by mouth daily. To lower blood pressure 08/18/22   Claiborne Rigg, NP  mupirocin ointment (BACTROBAN) 2 % Apply 1 Application topically 2 (two) times daily. 05/18/22   Claiborne Rigg, NP  OLANZapine (ZYPREXA) 20 MG tablet Take 1 tablet (20 mg total) by mouth at bedtime. 08/27/22   Park Pope, MD  prazosin (MINIPRESS) 1 MG capsule Take 1 capsule (1 mg total) by mouth at bedtime. 08/27/22   Park Pope, MD  rosuvastatin (CRESTOR) 40 MG tablet Take 1 tablet (40 mg total) by mouth daily. 03/17/22   Sharlene Dory, NP    Physical Exam: Vitals:   11/10/22 1515 11/10/22 1644 11/10/22 1646 11/10/22 1700  BP: 124/73 (!) 102/56  (!) 108/58  Pulse: (!) 109 (!) 102  98  Resp: 18 16  19   Temp:  97.9 F (36.6 C) 97.9 F (36.6 C)   TempSrc:   Oral   SpO2: 100% 100%  100%  Weight:      Height:        Constitutional: NAD, calm, comfortable Vitals:   11/10/22 1515 11/10/22 1644 11/10/22 1646 11/10/22 1700  BP: 124/73 (!) 102/56  (!)  108/58  Pulse: (!) 109 (!) 102  98  Resp: 18 16  19   Temp:  97.9 F (36.6 C) 97.9 F (36.6 C)   TempSrc:   Oral   SpO2: 100% 100%  100%  Weight:      Height:       Eyes: PERRL, lids and conjunctivae normal ENMT: Mucous membranes are moist. Posterior pharynx clear of any exudate or lesions.Normal dentition.  Neck: normal, supple, no masses, no thyromegaly Respiratory: clear to auscultation bilaterally, no wheezing, no crackles. Normal respiratory effort. No accessory muscle use.  Cardiovascular: Regular rate and rhythm, no murmurs / rubs / gallops. 2+ extremity edema. 2+ pedal pulses.  No carotid bruits.  Abdomen: no tenderness, no masses palpated. No hepatosplenomegaly. Bowel sounds positive.  Musculoskeletal: no clubbing / cyanosis. No joint deformity upper and lower extremities. Good ROM, no contractures. Normal muscle tone.  Skin: Large bilateral buttocks ulcers 20 x 20 cm, stage II with severe tenderness but no active discharge Neurologic: CN 2-12 grossly intact. DTR normal. Strength 5/5 in all 4.  Decreased light touch sensation on bilateral lower extremities below the knee Psychiatric: Normal judgment and insight. Alert and oriented x 3. Normal mood.    Labs on Admission: I have personally reviewed following labs and imaging studies  CBC: Recent Labs  Lab 11/09/22 1420 11/10/22 1404  WBC 13.7* 16.5*  NEUTROABS 10.8* 13.4*  HGB 13.5 12.8*  HCT 42.0 38.3*  MCV 93.5 94.1  PLT 346 342   Basic Metabolic Panel: Recent Labs  Lab 11/09/22 1530 11/10/22 1404  NA 134* 138  K 4.1 3.8  CL 98 96*  CO2 27 27  GLUCOSE 84 72  BUN 17 18  CREATININE 0.85 1.22  CALCIUM 8.4* 8.9   GFR: Estimated Creatinine Clearance: 65.5 mL/min (by C-G formula based on SCr of 1.22 mg/dL). Liver Function Tests: Recent Labs  Lab 11/09/22 1530 11/10/22 1404  AST 37 36  ALT 21 23  ALKPHOS 48 56  BILITOT 1.9* 1.6*  PROT 6.2* 6.0*  ALBUMIN 2.7* 2.4*   No results for input(s): "LIPASE",  "AMYLASE" in the last 168 hours. No results for input(s): "AMMONIA" in the last 168 hours. Coagulation Profile: No results for input(s): "INR", "PROTIME" in the last 168 hours. Cardiac Enzymes: No results for input(s): "CKTOTAL", "CKMB", "CKMBINDEX", "TROPONINI" in the last 168 hours. BNP (last 3 results) No results for input(s): "PROBNP" in the last 8760 hours. HbA1C: No results for input(s): "HGBA1C" in the last 72 hours. CBG: No results for input(s): "GLUCAP" in the last 168 hours. Lipid Profile: No results for input(s): "CHOL", "HDL", "LDLCALC", "TRIG", "CHOLHDL", "LDLDIRECT" in the last 72 hours. Thyroid Function Tests: No results for input(s): "TSH", "T4TOTAL", "FREET4", "T3FREE", "THYROIDAB" in the last 72 hours. Anemia Panel: No results for input(s): "VITAMINB12", "FOLATE", "FERRITIN", "TIBC", "IRON", "RETICCTPCT" in the last 72 hours. Urine analysis:    Component Value Date/Time   COLORURINE AMBER (A) 11/09/2022 1510   APPEARANCEUR CLEAR 11/09/2022 1510   LABSPEC 1.026 11/09/2022 1510   PHURINE 5.0 11/09/2022 1510   GLUCOSEU NEGATIVE 11/09/2022 1510   HGBUR NEGATIVE 11/09/2022 1510   BILIRUBINUR NEGATIVE 11/09/2022 1510   KETONESUR NEGATIVE 11/09/2022 1510   PROTEINUR 30 (A) 11/09/2022 1510   NITRITE NEGATIVE 11/09/2022 1510   LEUKOCYTESUR NEGATIVE 11/09/2022 1510    Radiological Exams on Admission: CT Cervical Spine Wo Contrast  Result Date: 11/10/2022 CLINICAL DATA:  Neck trauma EXAM: CT HEAD WITHOUT CONTRAST CT CERVICAL SPINE WITHOUT CONTRAST TECHNIQUE: Multidetector CT imaging of the head and cervical spine was performed following the standard protocol without intravenous contrast. Multiplanar CT image reconstructions of the cervical spine were also generated. RADIATION DOSE REDUCTION: This exam was performed according to the departmental dose-optimization program which includes automated exposure control, adjustment of the mA and/or kV according to patient size  and/or use of iterative reconstruction technique. COMPARISON:  CT Head and C Spine 11/05/22 FINDINGS: CT HEAD FINDINGS Brain: No evidence of acute infarction, hemorrhage, hydrocephalus, extra-axial collection or mass lesion/mass effect. Vascular: No hyperdense vessel or unexpected calcification. Skull: Mild soft tissue swelling along the midline parietal scalp. Negative for fracture or focal lesion. Sinuses/Orbits: No middle ear  or mastoid effusion. Impacted cerumen in bilateral EACs. Frothy secretions in the bilateral maxillary sinuses with an air-fluid level in the right. This is new/increased compared to 04/04 24. There is a likely chronic fracture of the lateral wall of the left maxillary sinus. Other: None. CT CERVICAL SPINE FINDINGS Alignment: Trace anterolisthesis of C5 on C6 and C6 on C7. Skull base and vertebrae: No acute fracture. No primary bone lesion or focal pathologic process. Degenerative endplate changes in the lower cervical spine. Soft tissues and spinal canal: No prevertebral fluid or swelling. No visible canal hematoma. Disc levels:  No evidence of high-grade spinal canal stenosis. Upper chest: Negative. Other: None IMPRESSION: CT HEAD: 1. No acute intracranial abnormality. 2. Mild soft tissue swelling along the midline parietal scalp. No underlying calvarial fracture. 3. Frothy secretions in the bilateral maxillary sinuses with an air-fluid level in the right maxillary sinus. This is new/increased compared to 04/04 24. Correlate for acute sinusitis. CT CERVICAL SPINE: No acute fracture or traumatic listhesis. Electronically Signed   By: Lorenza Cambridge M.D.   On: 11/10/2022 15:09   CT Head Wo Contrast  Result Date: 11/10/2022 CLINICAL DATA:  Neck trauma EXAM: CT HEAD WITHOUT CONTRAST CT CERVICAL SPINE WITHOUT CONTRAST TECHNIQUE: Multidetector CT imaging of the head and cervical spine was performed following the standard protocol without intravenous contrast. Multiplanar CT image reconstructions  of the cervical spine were also generated. RADIATION DOSE REDUCTION: This exam was performed according to the departmental dose-optimization program which includes automated exposure control, adjustment of the mA and/or kV according to patient size and/or use of iterative reconstruction technique. COMPARISON:  CT Head and C Spine 11/05/22 FINDINGS: CT HEAD FINDINGS Brain: No evidence of acute infarction, hemorrhage, hydrocephalus, extra-axial collection or mass lesion/mass effect. Vascular: No hyperdense vessel or unexpected calcification. Skull: Mild soft tissue swelling along the midline parietal scalp. Negative for fracture or focal lesion. Sinuses/Orbits: No middle ear or mastoid effusion. Impacted cerumen in bilateral EACs. Frothy secretions in the bilateral maxillary sinuses with an air-fluid level in the right. This is new/increased compared to 04/04 24. There is a likely chronic fracture of the lateral wall of the left maxillary sinus. Other: None. CT CERVICAL SPINE FINDINGS Alignment: Trace anterolisthesis of C5 on C6 and C6 on C7. Skull base and vertebrae: No acute fracture. No primary bone lesion or focal pathologic process. Degenerative endplate changes in the lower cervical spine. Soft tissues and spinal canal: No prevertebral fluid or swelling. No visible canal hematoma. Disc levels:  No evidence of high-grade spinal canal stenosis. Upper chest: Negative. Other: None IMPRESSION: CT HEAD: 1. No acute intracranial abnormality. 2. Mild soft tissue swelling along the midline parietal scalp. No underlying calvarial fracture. 3. Frothy secretions in the bilateral maxillary sinuses with an air-fluid level in the right maxillary sinus. This is new/increased compared to 04/04 24. Correlate for acute sinusitis. CT CERVICAL SPINE: No acute fracture or traumatic listhesis. Electronically Signed   By: Lorenza Cambridge M.D.   On: 11/10/2022 15:09   DG Chest Portable 1 View  Result Date: 11/10/2022 CLINICAL DATA:   Fall. EXAM: PORTABLE CHEST 1 VIEW COMPARISON:  November 05, 2022. FINDINGS: The heart size and mediastinal contours are within normal limits. Both lungs are clear. Old proximal left humeral fracture is noted. IMPRESSION: No active disease. Electronically Signed   By: Lupita Raider M.D.   On: 11/10/2022 12:41    EKG: Independently reviewed.  Sinus rhythm, no acute ST changes.  Assessment/Plan Principal Problem:  Sepsis Active Problems:   Alcohol use   Chronic ulcer of buttock   Cellulitis  (please populate well all problems here in Problem List. (For example, if patient is on BP meds at home and you resume or decide to hold them, it is a problem that needs to be her. Same for CAD, COPD, HLD and so on)  Sepsis -Evidenced by tachycardia, leukocytosis, with likely infection source of cellulitis/infected pressure ulcer of bilateral buttocks area. -Continue ceftriaxone -Patient has signs of fluid overload with bilateral pitting edema, hold off further crystalloid, if blood pressure drops, consider colloid for hydration  Stage II pressure ulcer on bilateral buttocks, POA -Check CK, no symptoms signs of compartment syndrome -Antibiotics coverage as above -X-ray to rule out foreign bodies  Deconditioning and generalized weakness -Unknown etiology.  Muscle strength appears to be preserved on bilateral lower extremity with possible peripheral neuropathy -PT evaluation  Acute on chronic HFpEF decompensation -No significant breathing compromise, no crackles on bilateral lungs on physical exam no infiltrates on chest x-ray.  Fluid overload predominantly on bilateral lower extremities below the knee -DVT study -Hold off Lasix for now as patient is in sepsis -Continue bisoprolol, hold off lisinopril  Alcohol abuse -Currently there is no symptoms or signs of acute withdrawal -CIWA protocol with as needed benzos  Hypoalbuminemia -UA appears to be mild proteinuria -Check INR   DVT prophylaxis:  Lovenox Code Status: Full code Family Communication: None at bedside Disposition Plan: Patient is sick with sepsis, requiring IV antibiotics and inpatient wound care evaluation for bilateral buttocks pressure ulcers, expect more than 2 midnight hospital stay Consults called: None Admission status: Tele admit  Emeline General MD [facility] Pager [phone]  11/10/2022, 5:44 PM

## 2022-11-10 NOTE — Consult Note (Addendum)
WOC Nurse Consult Note: Reason for Consult: wounds to buttocks  Wound type: Pressure  Pressure Injury POA: Yes Measurement: 1. L buttock 9 cms x 7 cms 100% black leathery devitalized tissue  2.  R buttock 5 cms x 9 cms 100% black leathery devitalized tissue  3.  Bilateral knees partial thickness, likely traumatic; L knee 3 cms x 2 cms R knee 2 cms x 3 cms 100% pink and moist   Drainage (amount, consistency, odor) minimal tan exudate from buttocks, R knee minimal serous  Periwound: erythema around all wounds  Dressing procedure/placement/frequency:  Cleanse B buttocks with soap and water, apply Dakins moistened gauze twice daily to wound beds x 3 days, cover with dry gauze and ABD pads and tape to secure. After 3 days (starting 11/13/2022) begin Medihoney to wound beds daily, cover with moist gauze, top with dry gauze and ABD pads.   Bilateral knees clean with NS, apply a single layer of Xeroform gauze [place] [phone]) to wound bed, cover with silicone foams. May lift silicone foam to reapply Xeroform gauze daily. Change foam dressings q3 days and prn soiling.    Patient should remain on low air loss mattress throughout hospitalization.  Will order consult for PT to evaluate for hydrotherapy to these wounds as well.    POC discussed with bedside nurse and patient.  WOC team will not follow this patient at this time.  Re-consult if further needs arise.   Thank you,    Priscella Mann MSN, RN-BC, [employer] [phone]

## 2022-11-10 NOTE — ED Notes (Signed)
Used warm, wet washcloths to help pt clean dried mucous from eyes, nose, and around mouth and facial hair. He declines additional cleaning at this time.

## 2022-11-10 NOTE — ED Notes (Signed)
ED TO INPATIENT HANDOFF REPORT  ED Nurse Name and Phone #: Gillis Ends #[phone]  S Name/Age/Gender [name] 72 y.o. male Room/Bed: 045C/045C  Code Status   Code Status: Full Code  Home/SNF/Other Home Patient oriented to: self, place, time, and situation Is this baseline? Yes   Triage Complete: Triage complete  Chief Complaint Sepsis [A41.9]  Triage Note Per [facility] pt coming from the streets. Patient states he was walking and fell backwards hitting back of his head. C-collar placed en route. Ems reports pt was tachypneic en route. Patient has bilateral feet swelling with pedal edema. Patient soiled in urine. Was seen at [facility] yesterday.    Allergies Allergies  Allergen Reactions   Penicillins Other (See Comments)    Unsure of reaction, had allergic reaction as a child and has not taken since then    Level of Care/Admitting Diagnosis ED Disposition     ED Disposition  Admit   Condition  --   Comment  Hospital Area: [facility] [[id number]]  Level of Care: Telemetry Medical [104]  May admit patient to [facility] or [facility] if equivalent level of care is available:: No  Covid Evaluation: Asymptomatic - no recent exposure (last 10 days) testing not required  Diagnosis: Sepsis [[id number]]  Admitting Physician: Emeline General [[id number]]  Attending Physician: Emeline General [[id number]]  Certification:: I certify this patient will need inpatient services for at least 2 midnights  Estimated Length of Stay: 3          B Medical/Surgery History Past Medical History:  Diagnosis Date   Acute urinary retention    Anxiety    Brain injury 1975   Complication of anesthesia 08/29/13   difficulty due to large tongue, anterior larynx and limited opening   Difficult intubation 08/31/13   Head injury 1975   "Brain Stem Contusion"   Manic depression    Pelvic fracture 09/21/13   Inferior Pubic Ramus, Left Superior Ramus   Schizo affective schizophrenia     Schizophrenia    Past Surgical History:  Procedure Laterality Date   COSMETIC SURGERY Left 1975   created new ear lobe   DRESSING CHANGE UNDER ANESTHESIA Left 08/31/2013   Procedure: DRESSING CHANGE UNDER ANESTHESIA for right ankle and left thigh;  Surgeon: Budd Palmer, MD;  Location: [facility] OR;  Service: Orthopedics;  Laterality: Left;   I & D EXTREMITY Bilateral 08/30/2013   Procedure: IRRIGATION AND DEBRIDEMENT with closure Left Thigh wound, Irrigation and debridement Right Ankle ;  Surgeon: Harvie Junior, MD;  Location: [facility] OR;  Service: Orthopedics;  Laterality: Bilateral;   I & D EXTREMITY Left 09/26/2013   Procedure: LEFT IRRIGATION AND DEBRIDEMENT Robby Sermon;  Surgeon: Budd Palmer, MD;  Location: [facility] OR;  Service: Orthopedics;  Laterality: Left;   I & D EXTREMITY Left 10/02/2013   Procedure: IRRIGATION AND DEBRIDEMENT EXTREMITY;  Surgeon: Budd Palmer, MD;  Location: [facility] OR;  Service: Orthopedics;  Laterality: Left;   INCISION AND DRAINAGE OF WOUND Left 09/21/2013   Procedure: IRRIGATION AND DEBRIDEMENT SOFT TISSUE WOUND WITH LARGE WOUND VAC PLACEMENT;  Surgeon: Budd Palmer, MD;  Location: [facility] OR;  Service: Orthopedics;  Laterality: Left;   MANDIBLE FRACTURE SURGERY  1975   SACRO-ILIAC PINNING Left 08/31/2013   Procedure: LEFT SACRO-ILIAC PINNING;  Surgeon: Budd Palmer, MD;  Location: [facility] OR;  Service: Orthopedics;  Laterality: Left;   SKIN GRAFT     SKIN GRAFT Left 10/31/2013  left thigh      DR HANDY    SKIN SPLIT GRAFT Left 10/02/2013   Procedure: SKIN GRAFT SPLIT THICKNESS;  Surgeon: Budd Palmer, MD;  Location: [facility] OR;  Service: Orthopedics;  Laterality: Left;   SKIN SPLIT GRAFT Left 10/31/2013   Procedure: LEFT THIGH SKIN GRAFT SPLIT THICKNESS;  Surgeon: Budd Palmer, MD;  Location: [facility] OR;  Service: Orthopedics;  Laterality: Left;     A IV Location/Drains/Wounds Patient Lines/Drains/Airways Status     Active Line/Drains/Airways     Name Placement date  Placement time Site Days   Peripheral IV 11/10/22 20 G Left Antecubital 11/10/22  1406  Antecubital  less than 1   Peripheral IV 11/10/22 22 G 1" Posterior;Right Hand 11/10/22  1643  Hand  less than 1            Intake/Output Last 24 hours No intake or output data in the 24 hours ending 11/10/22 1932  Labs/Imaging Results for orders placed or performed during the hospital encounter of 11/10/22 (from the past 48 hour(s))  CBC with Differential     Status: Abnormal   Collection Time: 11/10/22  2:04 PM  Result Value Ref Range   WBC 16.5 (H) 4.0 - 10.5 K/uL   RBC 4.07 (L) 4.22 - 5.81 MIL/uL   Hemoglobin 12.8 (L) 13.0 - 17.0 g/dL   HCT 16.1 (L) 09.6 - 04.5 %   MCV 94.1 80.0 - 100.0 fL   MCH 31.4 26.0 - 34.0 pg   MCHC 33.4 30.0 - 36.0 g/dL   RDW 40.9 81.1 - 91.4 %   Platelets 342 150 - 400 K/uL   nRBC 0.0 0.0 - 0.2 %   Neutrophils Relative % 81 %   Neutro Abs 13.4 (H) 1.7 - 7.7 K/uL   Lymphocytes Relative 9 %   Lymphs Abs 1.5 0.7 - 4.0 K/uL   Monocytes Relative 9 %   Monocytes Absolute 1.5 (H) 0.1 - 1.0 K/uL   Eosinophils Relative 0 %   Eosinophils Absolute 0.0 0.0 - 0.5 K/uL   Basophils Relative 0 %   Basophils Absolute 0.0 0.0 - 0.1 K/uL   Immature Granulocytes 1 %   Abs Immature Granulocytes 0.10 (H) 0.00 - 0.07 K/uL    Comment: Performed at [facility], [address], [place], [place] [address]  Comprehensive metabolic panel     Status: Abnormal   Collection Time: 11/10/22  2:04 PM  Result Value Ref Range   Sodium 138 135 - 145 mmol/L   Potassium 3.8 3.5 - 5.1 mmol/L   Chloride 96 (L) 98 - 111 mmol/L   CO2 27 22 - 32 mmol/L   Glucose, Bld 72 70 - 99 mg/dL    Comment: Glucose reference range applies only to samples taken after fasting for at least 8 hours.   BUN 18 8 - 23 mg/dL   Creatinine, Ser 6.21 0.61 - 1.24 mg/dL   Calcium 8.9 8.9 - 30.8 mg/dL   Total Protein 6.0 (L) 6.5 - 8.1 g/dL   Albumin 2.4 (L) 3.5 - 5.0 g/dL   AST 36 15 - 41 U/L   ALT 23 0 - 44  U/L   Alkaline Phosphatase 56 38 - 126 U/L   Total Bilirubin 1.6 (H) 0.3 - 1.2 mg/dL   GFR, Estimated >65 >78 mL/min    Comment: (NOTE) Calculated using the CKD-EPI Creatinine Equation (2021)    Anion gap 15 5 - 15    Comment: Performed at  [facility], [address], [place], [place] [address]  Brain natriuretic peptide     Status: None   Collection Time: 11/10/22  2:04 PM  Result Value Ref Range   B Natriuretic Peptide 62.1 0.0 - 100.0 pg/mL    Comment: Performed at [facility], [address], [place], [place] [address]  Lactic acid, plasma     Status: None   Collection Time: 11/10/22  2:04 PM  Result Value Ref Range   Lactic Acid, Venous 1.4 0.5 - 1.9 mmol/L    Comment: Performed at [facility], [address], [place], [place] [address]   DG Pelvis 1-2 Views  Result Date: 11/10/2022 CLINICAL DATA:  Cellulitis EXAM: PELVIS - 1-2 VIEW COMPARISON:  Pelvis x-ray 08/31/2013 FINDINGS: There are healed left-sided pubic rami fractures. Left sacroiliac screws are again seen. There is no acute fracture, dislocation or cortical erosion. Joint spaces are maintained. IMPRESSION: 1. No acute fracture or dislocation. 2. Healed left-sided pubic rami fractures. Electronically Signed   By: Darliss Cheney M.D.   On: 11/10/2022 19:01   CT Cervical Spine Wo Contrast  Result Date: 11/10/2022 CLINICAL DATA:  Neck trauma EXAM: CT HEAD WITHOUT CONTRAST CT CERVICAL SPINE WITHOUT CONTRAST TECHNIQUE: Multidetector CT imaging of the head and cervical spine was performed following the standard protocol without intravenous contrast. Multiplanar CT image reconstructions of the cervical spine were also generated. RADIATION DOSE REDUCTION: This exam was performed according to the departmental dose-optimization program which includes automated exposure control, adjustment of the mA and/or kV according to patient size and/or use of iterative reconstruction technique. COMPARISON:  CT Head and C Spine  11/05/22 FINDINGS: CT HEAD FINDINGS Brain: No evidence of acute infarction, hemorrhage, hydrocephalus, extra-axial collection or mass lesion/mass effect. Vascular: No hyperdense vessel or unexpected calcification. Skull: Mild soft tissue swelling along the midline parietal scalp. Negative for fracture or focal lesion. Sinuses/Orbits: No middle ear or mastoid effusion. Impacted cerumen in bilateral EACs. Frothy secretions in the bilateral maxillary sinuses with an air-fluid level in the right. This is new/increased compared to 04/04 24. There is a likely chronic fracture of the lateral wall of the left maxillary sinus. Other: None. CT CERVICAL SPINE FINDINGS Alignment: Trace anterolisthesis of C5 on C6 and C6 on C7. Skull base and vertebrae: No acute fracture. No primary bone lesion or focal pathologic process. Degenerative endplate changes in the lower cervical spine. Soft tissues and spinal canal: No prevertebral fluid or swelling. No visible canal hematoma. Disc levels:  No evidence of high-grade spinal canal stenosis. Upper chest: Negative. Other: None IMPRESSION: CT HEAD: 1. No acute intracranial abnormality. 2. Mild soft tissue swelling along the midline parietal scalp. No underlying calvarial fracture. 3. Frothy secretions in the bilateral maxillary sinuses with an air-fluid level in the right maxillary sinus. This is new/increased compared to 04/04 24. Correlate for acute sinusitis. CT CERVICAL SPINE: No acute fracture or traumatic listhesis. Electronically Signed   By: Lorenza Cambridge M.D.   On: 11/10/2022 15:09   CT Head Wo Contrast  Result Date: 11/10/2022 CLINICAL DATA:  Neck trauma EXAM: CT HEAD WITHOUT CONTRAST CT CERVICAL SPINE WITHOUT CONTRAST TECHNIQUE: Multidetector CT imaging of the head and cervical spine was performed following the standard protocol without intravenous contrast. Multiplanar CT image reconstructions of the cervical spine were also generated. RADIATION DOSE REDUCTION: This exam was  performed according to the departmental dose-optimization program which includes automated exposure control, adjustment of the mA and/or kV according  to patient size and/or use of iterative reconstruction technique. COMPARISON:  CT Head and C Spine 11/05/22 FINDINGS: CT HEAD FINDINGS Brain: No evidence of acute infarction, hemorrhage, hydrocephalus, extra-axial collection or mass lesion/mass effect. Vascular: No hyperdense vessel or unexpected calcification. Skull: Mild soft tissue swelling along the midline parietal scalp. Negative for fracture or focal lesion. Sinuses/Orbits: No middle ear or mastoid effusion. Impacted cerumen in bilateral EACs. Frothy secretions in the bilateral maxillary sinuses with an air-fluid level in the right. This is new/increased compared to 04/04 24. There is a likely chronic fracture of the lateral wall of the left maxillary sinus. Other: None. CT CERVICAL SPINE FINDINGS Alignment: Trace anterolisthesis of C5 on C6 and C6 on C7. Skull base and vertebrae: No acute fracture. No primary bone lesion or focal pathologic process. Degenerative endplate changes in the lower cervical spine. Soft tissues and spinal canal: No prevertebral fluid or swelling. No visible canal hematoma. Disc levels:  No evidence of high-grade spinal canal stenosis. Upper chest: Negative. Other: None IMPRESSION: CT HEAD: 1. No acute intracranial abnormality. 2. Mild soft tissue swelling along the midline parietal scalp. No underlying calvarial fracture. 3. Frothy secretions in the bilateral maxillary sinuses with an air-fluid level in the right maxillary sinus. This is new/increased compared to 04/04 24. Correlate for acute sinusitis. CT CERVICAL SPINE: No acute fracture or traumatic listhesis. Electronically Signed   By: Lorenza Cambridge M.D.   On: 11/10/2022 15:09   DG Chest Portable 1 View  Result Date: 11/10/2022 CLINICAL DATA:  Fall. EXAM: PORTABLE CHEST 1 VIEW COMPARISON:  November 05, 2022. FINDINGS: The heart size  and mediastinal contours are within normal limits. Both lungs are clear. Old proximal left humeral fracture is noted. IMPRESSION: No active disease. Electronically Signed   By: Lupita Raider M.D.   On: 11/10/2022 12:41    Pending Labs Unresulted Labs (From admission, onward)     Start     Ordered   11/11/22 0500  CBC  Tomorrow morning,   R        11/10/22 1736   11/11/22 0500  Comprehensive metabolic panel  Tomorrow morning,   R        11/10/22 1736   11/10/22 1736  HIV Antibody (routine testing w rflx)  (HIV Antibody (Routine testing w reflex) panel)  Once,   R        11/10/22 1736   11/10/22 1702  Protime-INR  Once,   STAT        11/10/22 1701   11/10/22 1702  CK  Add-on,   AD        11/10/22 1701   11/10/22 1700  Rapid urine drug screen (hospital performed)  ONCE - STAT,   STAT        11/10/22 1659   11/10/22 1150  Blood culture (routine x 2)  BLOOD CULTURE X 2,   R     Question:  Release to patient  Answer:  Immediate   11/10/22 1149            Vitals/Pain Today's Vitals   11/10/22 1651 11/10/22 1700 11/10/22 1800 11/10/22 1915  BP:  (!) 108/58 (!) 101/53 (!) 101/52  Pulse:  98 (!) 106 95  Resp:  19 18 20   Temp:   98 F (36.7 C)   TempSrc:   Oral   SpO2:  100% 98% 99%  Weight:      Height:      PainSc: 5   Asleep  Isolation Precautions No active isolations  Medications Medications  sodium hypochlorite (DAKIN'S 1/4 STRENGTH) topical solution (has no administration in time range)  leptospermum manuka honey (MEDIHONEY) paste 1 Application (has no administration in time range)  lactated ringers infusion ( Intravenous New Bag/Given 11/10/22 1632)  cefTRIAXone (ROCEPHIN) 2 g in sodium chloride 0.9 % 100 mL IVPB (0 g Intravenous Stopped 11/10/22 1820)  bisoprolol (ZEBETA) tablet 5 mg (has no administration in time range)  ezetimibe (ZETIA) tablet 10 mg (10 mg Oral Given 11/10/22 1854)  prazosin (MINIPRESS) capsule 1 mg (has no administration in time range)   rosuvastatin (CRESTOR) tablet 40 mg (40 mg Oral Given 11/10/22 1854)  busPIRone (BUSPAR) tablet 15 mg (15 mg Oral Given 11/10/22 1854)  OLANZapine (ZYPREXA) tablet 20 mg (has no administration in time range)  enoxaparin (LOVENOX) injection 40 mg (40 mg Subcutaneous Given 11/10/22 1856)  ondansetron (ZOFRAN) tablet 4 mg (has no administration in time range)    Or  ondansetron (ZOFRAN) injection 4 mg (has no administration in time range)  LORazepam (ATIVAN) tablet 1-4 mg (has no administration in time range)    Or  LORazepam (ATIVAN) injection 1-4 mg (has no administration in time range)  thiamine (VITAMIN B1) tablet 100 mg ( Oral See Alternative 11/10/22 1855)    Or  thiamine (VITAMIN B1) injection 100 mg (100 mg Intravenous Given 11/10/22 1855)  folic acid (FOLVITE) tablet 1 mg (1 mg Oral Given 11/10/22 1854)  multivitamin with minerals tablet 1 tablet (1 tablet Oral Given 11/10/22 1855)  lactated ringers bolus 1,000 mL (0 mLs Intravenous Stopped 11/10/22 1859)  lactated ringers bolus 1,000 mL (1,000 mLs Intravenous New Bag/Given 11/10/22 1859)  fentaNYL (SUBLIMAZE) injection 50 mcg (50 mcg Intravenous Given 11/10/22 1857)    Mobility non-ambulatory     Focused Assessments     R Recommendations: See Admitting Provider Note  Report given to:   Additional Notes:

## 2022-11-10 NOTE — ED Provider Notes (Signed)
[place] EMERGENCY DEPARTMENT AT [facility] Provider Note   CSN: [id number] Arrival date & time: 11/10/22  1111     History  Chief Complaint  Patient presents with   [name] is a 72 y.o. male.  Patient with history of schizophrenia presents today with complaints of fall. Patient is reportedly homeless but has been staying at the [facility] He is only able to walk a few steps at a time and earlier today when he went to walk he lost his balance and fell backwards and struck his head. He did not loose consciousness and is not anticoagulated. He was seen yesterday for weakness and was discharged. He states that his weakness is worsening to the point that he is unable to get around anymore. He presents soiled in feces and urine. Also notably has bilateral pitting edema. He denies chest pain or shortness of breath.   The history is provided by the patient. No language interpreter was used.  Fall       Home Medications Prior to Admission medications   Medication Sig Start Date End Date Taking? Authorizing Provider  bisoprolol (ZEBETA) 5 MG tablet Take 1 tablet (5 mg total) by mouth daily. 07/13/22   Claiborne Rigg, NP  busPIRone (BUSPAR) 15 MG tablet Take 1 tablet (15 mg total) by mouth 3 (three) times daily. 08/27/22   Park Pope, MD  ezetimibe (ZETIA) 10 MG tablet Take 1 tablet (10 mg total) by mouth daily. 10/05/22   Chandrasekhar, Mahesh A, MD  lisinopril (ZESTRIL) 5 MG tablet Take 1 tablet (5 mg total) by mouth daily. To lower blood pressure 08/18/22   Claiborne Rigg, NP  mupirocin ointment (BACTROBAN) 2 % Apply 1 Application topically 2 (two) times daily. 05/18/22   Claiborne Rigg, NP  OLANZapine (ZYPREXA) 20 MG tablet Take 1 tablet (20 mg total) by mouth at bedtime. 08/27/22   Park Pope, MD  prazosin (MINIPRESS) 1 MG capsule Take 1 capsule (1 mg total) by mouth at bedtime. 08/27/22   Park Pope, MD  rosuvastatin (CRESTOR) 40 MG tablet Take 1 tablet (40 mg  total) by mouth daily. 03/17/22   Sharlene Dory, NP      Allergies    Penicillins    Review of Systems   Review of Systems  Musculoskeletal:  Positive for gait problem.  All other systems reviewed and are negative.   Physical Exam Updated Vital Signs BP 118/70   Pulse (!) 103   Temp 98.1 F (36.7 C) (Oral)   Resp 15   Ht 6\' 2"  (1.88 m)   Wt 86.2 kg   SpO2 100%   BMI 24.39 kg/m  Physical Exam Vitals and nursing note reviewed.  Constitutional:      General: He is not in acute distress.    Appearance: Normal appearance. He is normal weight. He is not ill-appearing, toxic-appearing or diaphoretic.     Comments: Chronically ill appearing  HENT:     Head: Normocephalic and atraumatic.  Neck:     Comments: No tenderness to palpation of cervical spine. No stepoffs, lesions, or deformity. Cardiovascular:     Rate and Rhythm: Regular rhythm. Tachycardia present.     Heart sounds: Normal heart sounds.  Pulmonary:     Effort: Pulmonary effort is normal. No respiratory distress.     Breath sounds: Normal breath sounds.  Abdominal:     General: Abdomen is flat.     Palpations: Abdomen is soft.  Tenderness: There is no abdominal tenderness.  Musculoskeletal:        General: Normal range of motion.     Cervical back: Normal range of motion.     Comments: Bilateral 2+ pitting edema up to the knees  No tenderness to palpation of thoracic or lumbar spine.  No areas of focal bony tenderness.  Skin:    General: Skin is warm and dry.     Comments: Pressure sores noted to bilateral buttocks with surrounding erythema. See images below for further  Neurological:     General: No focal deficit present.     Mental Status: He is alert.  Psychiatric:        Mood and Affect: Mood normal.        Behavior: Behavior normal.     ED Results / Procedures / Treatments   Labs (all labs ordered are listed, but only abnormal results are displayed) Labs Reviewed  CBC WITH  DIFFERENTIAL/PLATELET - Abnormal; Notable for the following components:      Result Value   WBC 16.5 (*)    RBC 4.07 (*)    Hemoglobin 12.8 (*)    HCT 38.3 (*)    Neutro Abs 13.4 (*)    Monocytes Absolute 1.5 (*)    Abs Immature Granulocytes 0.10 (*)    All other components within normal limits  COMPREHENSIVE METABOLIC PANEL - Abnormal; Notable for the following components:   Chloride 96 (*)    Total Protein 6.0 (*)    Albumin 2.4 (*)    Total Bilirubin 1.6 (*)    All other components within normal limits  CULTURE, BLOOD (ROUTINE X 2)  CULTURE, BLOOD (ROUTINE X 2)  BRAIN NATRIURETIC PEPTIDE  LACTIC ACID, PLASMA    EKG None  Radiology CT Cervical Spine Wo Contrast  Result Date: 11/10/2022 CLINICAL DATA:  Neck trauma EXAM: CT HEAD WITHOUT CONTRAST CT CERVICAL SPINE WITHOUT CONTRAST TECHNIQUE: Multidetector CT imaging of the head and cervical spine was performed following the standard protocol without intravenous contrast. Multiplanar CT image reconstructions of the cervical spine were also generated. RADIATION DOSE REDUCTION: This exam was performed according to the departmental dose-optimization program which includes automated exposure control, adjustment of the mA and/or kV according to patient size and/or use of iterative reconstruction technique. COMPARISON:  CT Head and C Spine 11/05/22 FINDINGS: CT HEAD FINDINGS Brain: No evidence of acute infarction, hemorrhage, hydrocephalus, extra-axial collection or mass lesion/mass effect. Vascular: No hyperdense vessel or unexpected calcification. Skull: Mild soft tissue swelling along the midline parietal scalp. Negative for fracture or focal lesion. Sinuses/Orbits: No middle ear or mastoid effusion. Impacted cerumen in bilateral EACs. Frothy secretions in the bilateral maxillary sinuses with an air-fluid level in the right. This is new/increased compared to 04/04 24. There is a likely chronic fracture of the lateral wall of the left maxillary  sinus. Other: None. CT CERVICAL SPINE FINDINGS Alignment: Trace anterolisthesis of C5 on C6 and C6 on C7. Skull base and vertebrae: No acute fracture. No primary bone lesion or focal pathologic process. Degenerative endplate changes in the lower cervical spine. Soft tissues and spinal canal: No prevertebral fluid or swelling. No visible canal hematoma. Disc levels:  No evidence of high-grade spinal canal stenosis. Upper chest: Negative. Other: None IMPRESSION: CT HEAD: 1. No acute intracranial abnormality. 2. Mild soft tissue swelling along the midline parietal scalp. No underlying calvarial fracture. 3. Frothy secretions in the bilateral maxillary sinuses with an air-fluid level in the right maxillary sinus. This  is new/increased compared to 04/04 24. Correlate for acute sinusitis. CT CERVICAL SPINE: No acute fracture or traumatic listhesis. Electronically Signed   By: Lorenza CambridgeHemant  Desai M.D.   On: 11/10/2022 15:09   CT Head Wo Contrast  Result Date: 11/10/2022 CLINICAL DATA:  Neck trauma EXAM: CT HEAD WITHOUT CONTRAST CT CERVICAL SPINE WITHOUT CONTRAST TECHNIQUE: Multidetector CT imaging of the head and cervical spine was performed following the standard protocol without intravenous contrast. Multiplanar CT image reconstructions of the cervical spine were also generated. RADIATION DOSE REDUCTION: This exam was performed according to the departmental dose-optimization program which includes automated exposure control, adjustment of the mA and/or kV according to patient size and/or use of iterative reconstruction technique. COMPARISON:  CT Head and C Spine 11/05/22 FINDINGS: CT HEAD FINDINGS Brain: No evidence of acute infarction, hemorrhage, hydrocephalus, extra-axial collection or mass lesion/mass effect. Vascular: No hyperdense vessel or unexpected calcification. Skull: Mild soft tissue swelling along the midline parietal scalp. Negative for fracture or focal lesion. Sinuses/Orbits: No middle ear or mastoid effusion.  Impacted cerumen in bilateral EACs. Frothy secretions in the bilateral maxillary sinuses with an air-fluid level in the right. This is new/increased compared to 04/04 24. There is a likely chronic fracture of the lateral wall of the left maxillary sinus. Other: None. CT CERVICAL SPINE FINDINGS Alignment: Trace anterolisthesis of C5 on C6 and C6 on C7. Skull base and vertebrae: No acute fracture. No primary bone lesion or focal pathologic process. Degenerative endplate changes in the lower cervical spine. Soft tissues and spinal canal: No prevertebral fluid or swelling. No visible canal hematoma. Disc levels:  No evidence of high-grade spinal canal stenosis. Upper chest: Negative. Other: None IMPRESSION: CT HEAD: 1. No acute intracranial abnormality. 2. Mild soft tissue swelling along the midline parietal scalp. No underlying calvarial fracture. 3. Frothy secretions in the bilateral maxillary sinuses with an air-fluid level in the right maxillary sinus. This is new/increased compared to 04/04 24. Correlate for acute sinusitis. CT CERVICAL SPINE: No acute fracture or traumatic listhesis. Electronically Signed   By: Lorenza CambridgeHemant  Desai M.D.   On: 11/10/2022 15:09   DG Chest Portable 1 View  Result Date: 11/10/2022 CLINICAL DATA:  Fall. EXAM: PORTABLE CHEST 1 VIEW COMPARISON:  November 05, 2022. FINDINGS: The heart size and mediastinal contours are within normal limits. Both lungs are clear. Old proximal left humeral fracture is noted. IMPRESSION: No active disease. Electronically Signed   By: Lupita RaiderJames  Green Jr M.D.   On: 11/10/2022 12:41    Procedures .Critical Care  Performed by: Silva BandySmoot, Shivan Hodes A, PA-C Authorized by: Silva BandySmoot, Wheeler [employer] A, PA-C   Critical care provider statement:    Critical care time (minutes):  30   Critical care start time:  11/10/2022 4:00 PM   Critical care end time:  11/10/2022 4:30 PM   Critical care was necessary to treat or prevent imminent or life-threatening deterioration of the following conditions:   Sepsis   Critical care was time spent personally by me on the following activities:  Development of treatment plan with patient or surrogate, discussions with primary provider, evaluation of patient's response to treatment, examination of patient, obtaining history from patient or surrogate, ordering and review of laboratory studies, ordering and review of radiographic studies, pulse oximetry, re-evaluation of patient's condition and review of old charts   Care discussed with: admitting provider       Medications Ordered in ED Medications  sodium hypochlorite (DAKIN'S 1/4 STRENGTH) topical solution (has no administration in time range)  leptospermum manuka honey (MEDIHONEY) paste 1 Application (has no administration in time range)  lactated ringers infusion ( Intravenous New Bag/Given 11/10/22 1632)  lactated ringers bolus 1,000 mL (1,000 mLs Intravenous New Bag/Given 11/10/22 1629)  cefTRIAXone (ROCEPHIN) 2 g in sodium chloride 0.9 % 100 mL IVPB (2 g Intravenous New Bag/Given 11/10/22 1645)    ED Course/ Medical Decision Making/ A&P                             Medical Decision Making Amount and/or Complexity of Data Reviewed Labs: ordered. Radiology: ordered.  Risk OTC drugs. Prescription drug management.   This patient is a 72 y.o. male who presents to the ED for concern of fall, failure to thrive, this involves an extensive number of treatment options, and is a complaint that carries with it a high risk of complications and morbidity. The emergent differential diagnosis prior to evaluation includes, but is not limited to,  trauma, sepsis, electrolyte derangement . This is not an exhaustive differential.   Past Medical History / Co-morbidities / Social History: Hx schizophrenia, [facility]  Additional history: Chart reviewed. Pertinent results include: seen yesterday for weakness, had overall benign work-up  Physical Exam: Physical exam performed. The pertinent findings  include: saturated in urine and feces, pressure ulcers noted to bilateral buttocks. Bilateral pitting edema.   Lab Tests: I ordered, and personally interpreted labs.  The pertinent results include:  WBC 16.5 up from 13.7 1 day ago. Lactic WNL. BNP WNL   Imaging Studies: I ordered imaging studies including CT head, cervical spine, DG chest. I independently visualized and interpreted imaging which showed   CT:  1. No acute intracranial abnormality. 2. Mild soft tissue swelling along the midline parietal scalp. No underlying calvarial fracture. 3. Frothy secretions in the bilateral maxillary sinuses with an air-fluid level in the right maxillary sinus. This is new/increased compared to 04/04 24. Correlate for acute sinusitis.   DG: NAD  I agree with the radiologist interpretation.    Medications: I ordered medication including fluids, rocephin  for cellulitis. Reevaluation of the patient after these medicines showed that the patient stayed the same. I have reviewed the patients home medicines and have made adjustments as needed.    Disposition:  Patient presents today with complaints of fall. CT imaging benign. Also had bilateral pitting edema, no history of heart failure, BNP WNL, CXR unremarkable. Patient denies chest pain or shortness of breath. However, patient did arrive tachypneic and tachycardic and had WBC count 16. Code sepsis activated, likely source from patients bilateral buttock wounds. Started on fluids and antibiotics. Will require admission.   Discussed patient with hospitalist who agrees to admit  This is a shared visit with supervising physician Dr. Eloise HarmanPaterson who has independently evaluated patient & provided guidance in evaluation/management/disposition, in agreement with care   Final Clinical Impression(s) / ED Diagnoses Final diagnoses:  Fall, initial encounter  Sepsis without acute organ dysfunction, due to unspecified organism    Rx / DC Orders ED Discharge  Orders     None         Vear ClockSmoot, Emma Birchler A, PA-C 11/10/22 1732    Rondel BatonPaterson, Robert C, MD 11/13/22 [phone]

## 2022-11-10 NOTE — ED Triage Notes (Signed)
Per [facility] [place] coming [place] the [place]. Patient states he was walking and fell backwards hitting back of his head. C-collar placed en route. Ems reports pt was tachypneic en route. Patient has bilateral feet swelling with pedal edema. Patient soiled in urine. Was seen at [facility] yesterday.

## 2022-11-11 ENCOUNTER — Inpatient Hospital Stay (HOSPITAL_COMMUNITY): Payer: Medicare Other

## 2022-11-11 DIAGNOSIS — R600 Localized edema: Secondary | ICD-10-CM

## 2022-11-11 DIAGNOSIS — A419 Sepsis, unspecified organism: Secondary | ICD-10-CM | POA: Diagnosis not present

## 2022-11-11 LAB — GLUCOSE, CAPILLARY
Glucose-Capillary: 104 mg/dL — ABNORMAL HIGH (ref 70–99)
Glucose-Capillary: 87 mg/dL (ref 70–99)
Glucose-Capillary: 69 mg/dL — ABNORMAL LOW (ref 70–99)
Glucose-Capillary: 129 mg/dL — ABNORMAL HIGH (ref 70–99)
Glucose-Capillary: 133 mg/dL — ABNORMAL HIGH (ref 70–99)

## 2022-11-11 LAB — POCT I-STAT 7, (LYTES, BLD GAS, ICA,H+H)
Acid-Base Excess: 3 mmol/L — ABNORMAL HIGH (ref 0.0–2.0)
Bicarbonate: 28.8 mmol/L — ABNORMAL HIGH (ref 20.0–28.0)
Calcium, Ion: 1.28 mmol/L (ref 1.15–1.40)
HCT: 34 % — ABNORMAL LOW (ref 39.0–52.0)
Hemoglobin: 11.6 g/dL — ABNORMAL LOW (ref 13.0–17.0)
O2 Saturation: 99 %
Patient temperature: 97.4
Potassium: 3.5 mmol/L (ref 3.5–5.1)
Sodium: 136 mmol/L (ref 135–145)
TCO2: 30 mmol/L (ref 22–32)
pCO2 arterial: 45.5 mmHg (ref 32–48)
pH, Arterial: 7.406 (ref 7.35–7.45)
pO2, Arterial: 133 mmHg — ABNORMAL HIGH (ref 83–108)

## 2022-11-11 LAB — COMPREHENSIVE METABOLIC PANEL
ALT: 22 U/L (ref 0–44)
AST: 29 U/L (ref 15–41)
Albumin: 1.9 g/dL — ABNORMAL LOW (ref 3.5–5.0)
Alkaline Phosphatase: 48 U/L (ref 38–126)
Anion gap: 10 (ref 5–15)
BUN: 16 mg/dL (ref 8–23)
CO2: 25 mmol/L (ref 22–32)
Calcium: 8.5 mg/dL — ABNORMAL LOW (ref 8.9–10.3)
Chloride: 98 mmol/L (ref 98–111)
Creatinine, Ser: 1.01 mg/dL (ref 0.61–1.24)
GFR, Estimated: >60 mL/min (ref >60)
Glucose, Bld: 95 mg/dL (ref 70–99)
Potassium: 3.5 mmol/L (ref 3.5–5.1)
Sodium: 133 mmol/L — ABNORMAL LOW (ref 135–145)
Total Bilirubin: 1 mg/dL (ref 0.3–1.2)
Total Protein: 5.2 g/dL — ABNORMAL LOW (ref 6.5–8.1)

## 2022-11-11 LAB — PHOSPHORUS: Phosphorus: 3.9 mg/dL (ref 2.5–4.6)

## 2022-11-11 LAB — C-REACTIVE PROTEIN: CRP: 19 mg/dL — ABNORMAL HIGH (ref <1.0)

## 2022-11-11 LAB — BLOOD GAS, ARTERIAL
Acid-Base Excess: 3.2 mmol/L — ABNORMAL HIGH (ref 0.0–2.0)
Bicarbonate: 31.2 mmol/L — ABNORMAL HIGH (ref 20.0–28.0)
Drawn by: 51185
O2 Saturation: 89.3 %
Patient temperature: 37
pCO2 arterial: 62 mmHg — ABNORMAL HIGH (ref 32–48)
pH, Arterial: 7.31 — ABNORMAL LOW (ref 7.35–7.45)
pO2, Arterial: 61 mmHg — ABNORMAL LOW (ref 83–108)

## 2022-11-11 LAB — CBC
HCT: 36.6 % — ABNORMAL LOW (ref 39.0–52.0)
Hemoglobin: 12.5 g/dL — ABNORMAL LOW (ref 13.0–17.0)
MCH: 31.2 pg (ref 26.0–34.0)
MCHC: 34.2 g/dL (ref 30.0–36.0)
MCV: 91.3 fL (ref 80.0–100.0)
Platelets: 327 10*3/uL (ref 150–400)
RBC: 4.01 MIL/uL — ABNORMAL LOW (ref 4.22–5.81)
RDW: 13.8 % (ref 11.5–15.5)
WBC: 11.6 10*3/uL — ABNORMAL HIGH (ref 4.0–10.5)
nRBC: 0 % (ref 0.0–0.2)

## 2022-11-11 LAB — MAGNESIUM: Magnesium: 2.1 mg/dL (ref 1.7–2.4)

## 2022-11-11 LAB — CULTURE, BLOOD (ROUTINE X 2): Special Requests: ADEQUATE

## 2022-11-11 LAB — MRSA NEXT GEN BY PCR, NASAL: MRSA by PCR Next Gen: NOT DETECTED

## 2022-11-11 LAB — PROCALCITONIN: Procalcitonin: 0.39 ng/mL

## 2022-11-11 MED ORDER — FENTANYL CITRATE PF 50 MCG/ML IJ SOSY
50.0000 ug | PREFILLED_SYRINGE | INTRAMUSCULAR | Status: DC | PRN
  Administered 2022-11-11: 50 ug via INTRAVENOUS
  Administered 2022-11-11: 100 ug via INTRAVENOUS
  Administered 2022-11-12: 50 ug via INTRAVENOUS
  Filled 2022-11-11 (×4): qty 1

## 2022-11-11 MED ORDER — FENTANYL CITRATE PF 50 MCG/ML IJ SOSY: 100.0000 ug | PREFILLED_SYRINGE | Freq: Once | INTRAMUSCULAR | Status: AC

## 2022-11-11 MED ORDER — LACTATED RINGERS IV SOLN: INTRAVENOUS | Status: DC

## 2022-11-11 MED ORDER — DEXTROSE 10 % IV SOLN: INTRAVENOUS | Status: DC

## 2022-11-11 MED ORDER — CHLORHEXIDINE GLUCONATE CLOTH 2 % EX PADS
6.0000 | MEDICATED_PAD | Freq: Every day | CUTANEOUS | Status: DC
  Administered 2022-11-11 – 2022-11-26 (×16): 6 via TOPICAL

## 2022-11-11 MED ORDER — PROSOURCE TF20 ENFIT COMPATIBL EN LIQD
60.0000 mL | Freq: Every day | ENTERAL | Status: DC
  Administered 2022-11-11 – 2022-11-12 (×2): 60 mL
  Filled 2022-11-11 (×2): qty 60

## 2022-11-11 MED ORDER — FENTANYL CITRATE PF 50 MCG/ML IJ SOSY
50.0000 ug | PREFILLED_SYRINGE | Freq: Once | INTRAMUSCULAR | Status: AC
  Administered 2022-11-11: 50 ug via INTRAVENOUS

## 2022-11-11 MED ORDER — OSMOLITE 1.2 CAL PO LIQD
1000.0000 mL | ORAL | Status: DC
  Administered 2022-11-11: 1000 mL
  Filled 2022-11-11 (×2): qty 1000

## 2022-11-11 MED ORDER — HALOPERIDOL LACTATE 5 MG/ML IJ SOLN: 2.0000 mg | Freq: Four times a day (QID) | INTRAMUSCULAR | Status: DC | PRN

## 2022-11-11 NOTE — Procedures (Signed)
Cortrak  Person Inserting Tube:  Mahala Menghini, RD Tube Type:  Cortrak - 43 inches Tube Size:  10 Tube Location:  Right nare Secured by: Bridle Technique Used to Measure Tube Placement:  Marking at nare/corner of mouth Cortrak Secured At:  75 cm   Cortrak Tube Team Note:  Consult received to place a Cortrak feeding tube.   X-ray is required, abdominal x-ray has been ordered by the Cortrak team. Please confirm tube placement before using the Cortrak tube.   If the tube becomes dislodged please keep the tube and contact the Cortrak team at [email or web address] for replacement.  If after hours and replacement cannot be delayed, place a NG tube and confirm placement with an abdominal x-ray.    Mertie Clause, MS, RD, LDN Inpatient Clinical Dietitian Please see AMiON for contact information.

## 2022-11-11 NOTE — Progress Notes (Signed)
PT Cancellation Note  Patient Details Name: [name] MRN: [id number] DOB: 1950/12/31   Cancelled Treatment:    Reason Eval/Treat Not Completed: Medical issues which prohibited therapy. Pt transferred to ICU. PT will hold and follow up as appropriate/available. Thank you.   Leonie Man 11/11/2022, 10:33 AM

## 2022-11-11 NOTE — Progress Notes (Signed)
SLP Cancellation Note  Patient Details Name: [name] MRN: [id number] DOB: 08/19/1950   Cancelled treatment:       Reason Eval/Treat Not Completed: Medical issues which prohibited therapy. Pt being transferred to ICU due to decreased management of secretions, possible intubation. Will continue efforts.    Royce Macadamia 11/11/2022, 8:31 AM

## 2022-11-11 NOTE — Consult Note (Signed)
NAME:  [name], MRN:  [id number], DOB:  09-Nov-1950, LOS: 1 ADMISSION DATE:  11/10/2022, CONSULTATION DATE:  11/11/22 REFERRING MD:  Dr. Thedore Mins, CHIEF COMPLAINT:  Weakness/falls   Brief History   [name] is a 72 y.o. person living with a history of alcohol use disorder, HTN, HFpEF, schizophrenia, anxiety/depression, BPH, PTSD, tobacco use disorder, unhomed presented with generalized weakness and frequent falls.    History of present illness   72 y/o person presented with generalized weakness and frequent falls. Initially admitted to [facility] for sepsis secondary to infected bilateral stage II pressure ulcers of the buttocks. He was started on ceftriaxone. Overnight he became agitated and received ativan for this. He then developed metabolic and toxic encephalopathy with concern for his inability to protect his airway. Transferred to the ICU for further management of his hypercarbic/hypoxic respiratory failure.   Past Medical History  Alcohol use Hypertension Chronic HFpEF Schizophrenia Anxiety/depression Unhomed PTSD BPH  Significant Hospital Events   4/9 - admitted for sepsis thought to be secondary to sacral ulcers 4/10 - transferred to the ICU, BiPAP  Consults:  PCCM  Procedures:  N/A  Significant Diagnostic Tests:  4/10 - chest xray - airspace opacities in the left mid/lower lung fields and right lobes  Micro Data:  4/10 - blood cultures no growth at 24 hours 4/10 - MRSA nasal swab negative  Antimicrobials:  Day 2 ceftriaxone   Interim history/subjective:  Transferred to ICU due to worsening dyspnea. Breathing comfortably on bipap.   Objective   Blood pressure (!) 116/51, pulse 88, temperature (!) 96.4 F (35.8 C), temperature source Axillary, resp. rate 18, height 6\' 2"  (1.88 m), weight 86.2 kg, SpO2 96 %.    Vent Mode: BIPAP FiO2 (%):  [40 %-60 %] 60 % Set Rate:  [12 bmp] 12 bmp PEEP:  [5 cmH20] 5 cmH20 Plateau Pressure:  [5  cmH20] 5 cmH20  No intake or output data in the 24 hours ending 11/11/22 0926 Filed Weights   11/10/22 1123  Weight: 86.2 kg   Examination: Constitutional: ill appearing, resting comfortably HENT: normocephalic atraumatic Eyes: conjunctiva non-erythematous Neck: supple Cardiovascular: regular rate and rhythm, no m/r/g. Pulmonary/Chest: normal work of breathing on bipap, lungs clear to auscultation bilaterally Abdominal: soft, non-tender, non-distended MSK: normal bulk and tone. See images for sacral ulcers Neurological: somnolent but awakes to verbal and physical stimuli Skin: warm and dry. 1+ lower extremity edema  Assessment & Plan:   Mr. Verhagen is a 72 y/o person living with a history of alcohol use disorder, HTN, HFpEF, schizophrenia, anxiety/depression, BPH, PTSD, tobacco use disorder, unhomed presented with generalized weakness and frequent falls admitted for sepsis secondary to sacral ulcers. Transferred to the ICU for worsening mentation and concern for respiratory failure  Acute hypercarbic/hypoxic respiratory failure In setting of receiving sedating medications, do not suspect secondary to infection. Saturating well on bipap and mentation seems to have improved. Will try to to wean off as able.  -hold sedating medications  Acute encephalopathy Multifactorial in setting of acute illness and likely withdrawal from substance use. Once more awake and agitated will start phenobarbital taper for alcohol withdrawal.  -monitor mental status -treat alcohol withdrawal and schizophrenia once improves  Chronic sacral ulcers  Concern for cellulitis Concern for surrounding cellulitis and possible infected ulcers. Continue abx and follow blood cultures. CRP elevated but do not suspect osteomyelitis.  -ceftriaxone day 2/7 -continue hydrotherapy with fentanyl before therapy -follow blood cultures  Alcohol use disorder -start  phenobarbital taper once respiratory dysfunction  improves -CIWA w/o ativan  Normocytic anemia Likely in setting of acute illness and chronic ulcers. If no improvement will get blood smear, retic, and iron studies -CBC tomorrow  Hyponatremia 133. Likely hypovolemic with poor po intake.  -repeat BMP tomorrow  BPH Does not appear to be on any medications at home, will monitor urine output.   Schizophrenia -restart home zyprexa 20 mg and buspar 15 mg TID once off bipap  PTSD -hold home prazosin during acute illness   Tobacco use -encourage cessation -nicotine patches as needed  Best practice:  Diet: NPO, on bipap Pain/Anxiety/Delirium protocol: Fentanyl during hydrotherapy. DVT prophylaxis: lovenox GI prophylaxis: none at this time Glucose control: none Mobility: pending Code Status: full Disposition: Pending improvement in respiratory status, if does well and able to come off bipap can transfer back to floor.   Labs   CBC: Recent Labs  Lab 11/09/22 1420 11/10/22 1404 11/11/22 0412  WBC 13.7* 16.5* 11.6*  NEUTROABS 10.8* 13.4*  --   HGB 13.5 12.8* 12.5*  HCT 42.0 38.3* 36.6*  MCV 93.5 94.1 91.3  PLT 346 342 327    Basic Metabolic Panel: Recent Labs  Lab 11/09/22 1530 11/10/22 1404 11/11/22 0412  NA 134* 138 133*  K 4.1 3.8 3.5  CL 98 96* 98  CO2 27 27 25   GLUCOSE 84 72 95  BUN 17 18 16   CREATININE 0.85 1.22 1.01  CALCIUM 8.4* 8.9 8.5*   GFR: Estimated Creatinine Clearance: 79.1 mL/min (by C-G formula based on SCr of 1.01 mg/dL). Recent Labs  Lab 11/09/22 1420 11/10/22 1404 11/11/22 0412  PROCALCITON  --   --  0.39  WBC 13.7* 16.5* 11.6*  LATICACIDVEN  --  1.4  --     Liver Function Tests: Recent Labs  Lab 11/09/22 1530 11/10/22 1404 11/11/22 0412  AST 37 36 29  ALT 21 23 22   ALKPHOS 48 56 48  BILITOT 1.9* 1.6* 1.0  PROT 6.2* 6.0* 5.2*  ALBUMIN 2.7* 2.4* 1.9*   No results for input(s): "LIPASE", "AMYLASE" in the last 168 hours. No results for input(s): "AMMONIA" in the last 168  hours.  ABG    Component Value Date/Time   PHART 7.31 (L) 11/11/2022 0746   PCO2ART 62 (H) 11/11/2022 0746   PO2ART 61 (L) 11/11/2022 0746   HCO3 31.2 (H) 11/11/2022 0746   TCO2 26 08/29/2013 1743   O2SAT 89.3 11/11/2022 0746     Coagulation Profile: Recent Labs  Lab 11/10/22 2018  INR 1.2    Cardiac Enzymes: Recent Labs  Lab 11/10/22 2018  CKTOTAL 163    HbA1C: Hgb A1c MFr Bld  Date/Time Value Ref Range Status  01/02/2022 03:48 PM 5.6 4.8 - 5.6 % Final    Comment:             Prediabetes: 5.7 - 6.4          Diabetes: >6.4          Glycemic control for adults with diabetes: <7.0   10/23/2020 02:25 PM 5.5 4.8 - 5.6 % Final    Comment:             Prediabetes: 5.7 - 6.4          Diabetes: >6.4          Glycemic control for adults with diabetes: <7.0     CBG: Recent Labs  Lab 11/11/22 0910  GLUCAP 104*    Review of Systems:  Unable to interpret while on bipap  Past Medical History  He,  has a past medical history of Acute urinary retention, Anxiety, Brain injury (1975), Complication of anesthesia (08/29/13), Difficult intubation (08/31/13), Head injury (1975), Manic depression, Pelvic fracture (09/21/13), Schizo affective schizophrenia, and Schizophrenia.   Surgical History    Past Surgical History:  Procedure Laterality Date   COSMETIC SURGERY Left 1975   created new ear lobe   DRESSING CHANGE UNDER ANESTHESIA Left 08/31/2013   Procedure: DRESSING CHANGE UNDER ANESTHESIA for right ankle and left thigh;  Surgeon: Budd Palmer, MD;  Location: [facility] OR;  Service: Orthopedics;  Laterality: Left;   I & D EXTREMITY Bilateral 08/30/2013   Procedure: IRRIGATION AND DEBRIDEMENT with closure Left Thigh wound, Irrigation and debridement Right Ankle ;  Surgeon: Harvie Junior, MD;  Location: [facility] OR;  Service: Orthopedics;  Laterality: Bilateral;   I & D EXTREMITY Left 09/26/2013   Procedure: LEFT IRRIGATION AND DEBRIDEMENT Robby Sermon;  Surgeon: Budd Palmer,  MD;  Location[facility];  Service: Orthopedics;  Laterality: Left;   I & D EXTREMITY Left 10/02/2013   Procedure: IRRIGATION AND DEBRIDEMENT EXTREMITY;  Surgeon: Budd Palmer, MD;  Location: [facility] OR;  Service: Orthopedics;  Laterality: Left;   INCISION AND DRAINAGE OF WOUND Left 09/21/2013   Procedure: IRRIGATION AND DEBRIDEMENT SOFT TISSUE WOUND WITH LARGE WOUND VAC PLACEMENT;  Surgeon: Budd Palmer, MD;  Location: [facility] OR;  Service: Orthopedics;  Laterality: Left;   MANDIBLE FRACTURE SURGERY  1975   SACRO-ILIAC PINNING Left 08/31/2013   Procedure: LEFT SACRO-ILIAC PINNING;  Surgeon: Budd Palmer, MD;  Location: [facility] OR;  Service: Orthopedics;  Laterality: Left;   SKIN GRAFT     SKIN GRAFT Left 10/31/2013   left thigh      DR HANDY    SKIN SPLIT GRAFT Left 10/02/2013   Procedure: SKIN GRAFT SPLIT THICKNESS;  Surgeon: Budd Palmer, MD;  Location: [facility] OR;  Service: Orthopedics;  Laterality: Left;   SKIN SPLIT GRAFT Left 10/31/2013   Procedure: LEFT THIGH SKIN GRAFT SPLIT THICKNESS;  Surgeon: Budd Palmer, MD;  Location: [facility] OR;  Service: Orthopedics;  Laterality: Left;     Social History   reports that he has been smoking cigars. He has never used smokeless tobacco. He reports current alcohol use of about 6.0 standard drinks of alcohol per week. He reports that he does not use drugs.   Family History   His family history includes Bone cancer in his father; Diabetes in his mother; Obsessive Compulsive Disorder in his father. There is no history of Colon cancer, Rectal cancer, Liver cancer, or Pancreatic cancer.   Allergies Allergies  Allergen Reactions   Penicillins Other (See Comments)    Unsure of reaction, had allergic reaction as a child and has not taken since then     Home Medications  Prior to Admission medications   Medication Sig Start Date End Date Taking? Authorizing Provider  bisoprolol (ZEBETA) 5 MG tablet Take 1 tablet (5 mg total) by mouth daily. 07/13/22   Claiborne Rigg,  NP  busPIRone (BUSPAR) 15 MG tablet Take 1 tablet (15 mg total) by mouth 3 (three) times daily. 08/27/22   Park Pope, MD  ezetimibe (ZETIA) 10 MG tablet Take 1 tablet (10 mg total) by mouth daily. 10/05/22   Chandrasekhar, Mahesh A, MD  lisinopril (ZESTRIL) 5 MG tablet Take 1 tablet (5 mg total) by mouth daily. To lower blood pressure 08/18/22  Claiborne Rigg, NP  mupirocin ointment (BACTROBAN) 2 % Apply 1 Application topically 2 (two) times daily. 05/18/22   Claiborne Rigg, NP  OLANZapine (ZYPREXA) 20 MG tablet Take 1 tablet (20 mg total) by mouth at bedtime. 08/27/22   Park Pope, MD  prazosin (MINIPRESS) 1 MG capsule Take 1 capsule (1 mg total) by mouth at bedtime. 08/27/22   Park Pope, MD  rosuvastatin (CRESTOR) 40 MG tablet Take 1 tablet (40 mg total) by mouth daily. 03/17/22   Sharlene Dory, NP    Thalia Bloodgood DO  Internal Medicine Resident PGY-3 [place]  Pager: [phone]

## 2022-11-11 NOTE — Progress Notes (Signed)
Physical Therapy Wound Treatment Patient Details  Name: [name] MRN: [id number] Date of Birth: November 09, 1950  Today's Date: 11/11/2022 Time: 1100-1215 Time Calculation (min): 75 min  Subjective  Subjective Assessment Subjective: Pt on BiPAP and not attempting to verbally respond to therapist Patient and Family Stated Goals: None stated Date of Onset:  (Unknown) Prior Treatments: Dressing changes  Pain Score:  Pt premedicated with IV pain meds, and required additional IV pain meds during debridement.   Wound Assessment  Wound / Incision (Open or Dehisced) 11/10/22 Other (Comment) Buttocks Left Pressure wound (Active)  Wound Image   11/11/22 1500  Dressing Type Foam - Lift dressing to assess site every shift;Gauze (Comment);Dakin's-soaked gauze;Barrier Film (skin prep);Moist to moist 11/11/22 1500  Dressing Changed Changed 11/11/22 1500  Dressing Status Clean, Dry, Intact 11/11/22 1500  Dressing Change Frequency Daily 11/11/22 1500  Site / Wound Assessment Black;Brown 11/11/22 1500  % Wound base Red or Granulating 0% 11/11/22 1500  % Wound base Yellow/Fibrinous Exudate 0% 11/11/22 1500  % Wound base Black/Eschar 100% 11/11/22 1500  % Wound base Other/Granulation Tissue (Comment) 0% 11/11/22 1500  Peri-wound Assessment Erythema (blanchable) 11/11/22 1500  Wound Length (cm) 9 cm 11/11/22 1100  Wound Width (cm) 8.2 cm 11/11/22 1100  Wound Depth (cm) 0.1 cm 11/11/22 1100  Wound Volume (cm^3) 7.38 cm^3 11/11/22 1100  Wound Surface Area (cm^2) 73.8 cm^2 11/11/22 1100  Tunneling (cm) 0 11/11/22 1500  Undermining (cm) 0 11/11/22 1500  Closure None 11/11/22 1500  Drainage Amount Minimal 11/11/22 1500  Drainage Description Serosanguineous 11/11/22 1500  Treatment Debridement (Selective);Irrigation;Other (Comment) 11/11/22 1500     Wound / Incision (Open or Dehisced) 11/10/22 Other (Comment) Buttocks Right Pressure Wound (Active)  Wound Image   11/11/22 1500  Dressing Type Foam -  Lift dressing to assess site every shift;Gauze (Comment);Dakin's-soaked gauze;Barrier Film (skin prep);Moist to moist 11/11/22 1500  Dressing Changed Changed 11/11/22 1500  Dressing Status Clean, Dry, Intact 11/11/22 1500  Dressing Change Frequency Daily 11/11/22 1500  Site / Wound Assessment Black;Brown;Bleeding 11/11/22 1500  % Wound base Red or Granulating 0% 11/11/22 1500  % Wound base Yellow/Fibrinous Exudate 0% 11/11/22 1500  % Wound base Black/Eschar 100% 11/11/22 1500  Peri-wound Assessment Erythema (blanchable) 11/11/22 1500  Wound Length (cm) 7 cm 11/11/22 1100  Wound Width (cm) 6 cm 11/11/22 1100  Wound Depth (cm) 0.1 cm 11/11/22 1100  Wound Volume (cm^3) 4.2 cm^3 11/11/22 1100  Wound Surface Area (cm^2) 42 cm^2 11/11/22 1100  Tunneling (cm) 0 11/11/22 1500  Undermining (cm) 0 11/11/22 1500  Margins Unattached edges (unapproximated) 11/11/22 1500  Closure None 11/11/22 1500  Drainage Amount Minimal 11/11/22 1500  Drainage Description Purulent 11/11/22 1500  Treatment Debridement (Selective);Irrigation;Other (Comment) 11/11/22 1500   Selective Debridement (non-excisional) Selective Debridement (non-excisional) - Location: Bilateral buttocks Selective Debridement (non-excisional) - Tools Used: Forceps, Scalpel Selective Debridement (non-excisional) - Tissue Removed: Eschar    Wound Assessment and Plan  Wound Therapy - Assess/Plan/Recommendations Wound Therapy - Clinical Statement: This patient presents to wound therapy with bilateral buttock wounds. Debridement initiated. This patient will benefit from continued wound therapy for selective removal of unviable tissue, to decrease bioburden, and promote wound bed healing. Wound Therapy - Functional Problem List: Decreased tolerance for OOB and positioning due to wounds Factors Delaying/Impairing Wound Healing: Infection - systemic/local, Immobility Hydrotherapy Plan: Debridement, Dressing change, Patient/family  education Wound Therapy - Frequency: 2X / week Wound Therapy - Follow Up Recommendations: dressing changes by RN  Wound Therapy Goals-  Improve the function of patient's integumentary system by progressing the wound(s) through the phases of wound healing (inflammation - proliferation - remodeling) by: Wound Therapy Goals - Improve the function of patient's integumentary system by progressing the wound(s) through the phases of wound healing by: Decrease Necrotic Tissue to: 20% Decrease Necrotic Tissue - Progress: Goal set today Increase Granulation Tissue to: 80% Increase Granulation Tissue - Progress: Goal set today Goals/treatment plan/discharge plan were made with and agreed upon by patient/family: No, Patient unable to participate in goals/treatment/discharge plan and family unavailable Time For Goal Achievement: 7 days Wound Therapy - Potential for Goals: Good  Goals will be updated until maximal potential achieved or discharge criteria met.  Discharge criteria: when goals achieved, discharge from hospital, MD decision/surgical intervention, no progress towards goals, refusal/missing three consecutive treatments without notification or medical reason.  GP     Charges PT Wound Care Charges $Wound Debridement up to 20 cm: < or equal to 20 cm $ Wound Debridement each add'l 20 sqcm: 5 $PT Hydrotherapy Visit: 2 Visits       Marylynn Pearson 11/11/2022, 3:34 PM  Conni Slipper, PT, DPT Acute Rehabilitation Services Secure Chat Preferred Office: [phone]

## 2022-11-11 NOTE — Consult Note (Signed)
WOC consult placed for buttocks.  WOC made a visit to this patient while in Emergency Room yesterday, wrote a note and orders for wound care.  Also ordered PT Hydrotherapy evaluation.  Please see consult note 11/10/2022.    Thank you,    Priscella Mann MSN, RN-BC, [employer] [phone]

## 2022-11-11 NOTE — Progress Notes (Signed)
Lower extremity venous bilateral attempted. Patient being transferred to ICU. Vascular lab can attempt again as schedule and patient condition permits.   Jean Rosenthal, RDMS, RVT

## 2022-11-11 NOTE — Plan of Care (Signed)
72 y.o. male with medical history significant of alcohol abuse, HTN and chronic HFpEF, schizophrenia, anxiety/depression, homeless, presented with generalized weakness and frequent falls, was admitted overnight for generalized weakness, he was found to have sepsis likely from bilateral stage II pressure ulcers on his buttocks with cellulitis, overnight he became agitated and received Ativan after which he has developed metabolic and toxic encephalopathy, evidence of CO2 retention.  Currently 2 unresponsive for BiPAP on the floor, will move him to ICU, hopefully he can avoid intubation.  Once he comes out of ICU [facility] will resume care.  Case discussed with Dr. Myrla Halsted.

## 2022-11-11 NOTE — Progress Notes (Signed)
Brief Nutrition Note  Consult received for enteral/tube feeding initiation and management.  Adult Enteral Nutrition Protocol initiated. Full assessment to follow.  Cortrak tube in place with tip located in proximal duodenum per xray imaging.   Admitting Dx: Fall, initial encounter [W19.XXXA] Sepsis [A41.9] Sepsis without acute organ dysfunction, due to unspecified organism [A41.9]  Labs:  Recent Labs  Lab 11/09/22 1530 11/10/22 1404 11/11/22 0412 11/11/22 1332  NA 134* 138 133* 136  K 4.1 3.8 3.5 3.5  CL 98 96* 98  --   CO2 27 27 25   --   BUN 17 18 16   --   CREATININE 0.85 1.22 1.01  --   CALCIUM 8.4* 8.9 8.5*  --   GLUCOSE 84 72 95  --      Mertie Clause, MS, RD, LDN Inpatient Clinical Dietitian Please see [facility] for contact information.

## 2022-11-12 DIAGNOSIS — A419 Sepsis, unspecified organism: Secondary | ICD-10-CM | POA: Diagnosis not present

## 2022-11-12 LAB — BASIC METABOLIC PANEL
Anion gap: 7 (ref 5–15)
BUN: 18 mg/dL (ref 8–23)
CO2: 32 mmol/L (ref 22–32)
Calcium: 8.5 mg/dL — ABNORMAL LOW (ref 8.9–10.3)
Chloride: 100 mmol/L (ref 98–111)
Creatinine, Ser: 1.28 mg/dL — ABNORMAL HIGH (ref 0.61–1.24)
GFR, Estimated: >60 mL/min (ref >60)
Glucose, Bld: 105 mg/dL — ABNORMAL HIGH (ref 70–99)
Potassium: 3.6 mmol/L (ref 3.5–5.1)
Sodium: 139 mmol/L (ref 135–145)

## 2022-11-12 LAB — CBC WITH DIFFERENTIAL/PLATELET
Abs Immature Granulocytes: 0.05 10*3/uL (ref 0.00–0.07)
Basophils Absolute: 0 10*3/uL (ref 0.0–0.1)
Basophils Relative: 0 %
Eosinophils Absolute: 0.1 10*3/uL (ref 0.0–0.5)
Eosinophils Relative: 1 %
HCT: 47 % (ref 39.0–52.0)
Hemoglobin: 15 g/dL (ref 13.0–17.0)
Immature Granulocytes: 1 %
Lymphocytes Relative: 12 %
Lymphs Abs: 1 10*3/uL (ref 0.7–4.0)
MCH: 29.9 pg (ref 26.0–34.0)
MCHC: 31.9 g/dL (ref 30.0–36.0)
MCV: 93.8 fL (ref 80.0–100.0)
Monocytes Absolute: 0.6 10*3/uL (ref 0.1–1.0)
Monocytes Relative: 7 %
Neutro Abs: 6.9 10*3/uL (ref 1.7–7.7)
Neutrophils Relative %: 79 %
Platelets: 251 10*3/uL (ref 150–400)
RBC: 5.01 MIL/uL (ref 4.22–5.81)
RDW: 13.9 % (ref 11.5–15.5)
WBC: 8.7 10*3/uL (ref 4.0–10.5)
nRBC: 0 % (ref 0.0–0.2)

## 2022-11-12 LAB — GLUCOSE, CAPILLARY
Glucose-Capillary: 111 mg/dL — ABNORMAL HIGH (ref 70–99)
Glucose-Capillary: 132 mg/dL — ABNORMAL HIGH (ref 70–99)
Glucose-Capillary: 154 mg/dL — ABNORMAL HIGH (ref 70–99)
Glucose-Capillary: 161 mg/dL — ABNORMAL HIGH (ref 70–99)
Glucose-Capillary: 90 mg/dL (ref 70–99)
Glucose-Capillary: 98 mg/dL (ref 70–99)

## 2022-11-12 LAB — PHOSPHORUS
Phosphorus: 3.9 mg/dL (ref 2.5–4.6)
Phosphorus: 3.5 mg/dL (ref 2.5–4.6)

## 2022-11-12 LAB — MAGNESIUM
Magnesium: 2.2 mg/dL (ref 1.7–2.4)
Magnesium: 2.2 mg/dL (ref 1.7–2.4)

## 2022-11-12 LAB — CULTURE, BLOOD (ROUTINE X 2): Culture: NO GROWTH

## 2022-11-12 MED ORDER — PROSOURCE TF20 ENFIT COMPATIBL EN LIQD
60.0000 mL | Freq: Two times a day (BID) | ENTERAL | Status: DC
  Administered 2022-11-12 – 2022-11-20 (×16): 60 mL
  Filled 2022-11-12 (×13): qty 60

## 2022-11-12 MED ORDER — POTASSIUM CHLORIDE 10 MEQ/100ML IV SOLN
10.0000 meq | INTRAVENOUS | Status: AC
  Administered 2022-11-12 (×4): 10 meq via INTRAVENOUS
  Filled 2022-11-12: qty 100

## 2022-11-12 MED ORDER — PRAZOSIN HCL 1 MG PO CAPS
1.0000 mg | ORAL_CAPSULE | Freq: Every day | ORAL | Status: DC
  Administered 2022-11-12: 1 mg via ORAL
  Filled 2022-11-12 (×2): qty 1

## 2022-11-12 MED ORDER — BUSPIRONE HCL 5 MG PO TABS
15.0000 mg | ORAL_TABLET | Freq: Three times a day (TID) | ORAL | Status: DC
  Administered 2022-11-12 – 2022-11-18 (×21): 15 mg via ORAL
  Filled 2022-11-12 (×8): qty 3
  Filled 2022-11-12: qty 1
  Filled 2022-11-12 (×12): qty 3

## 2022-11-12 MED ORDER — POTASSIUM CHLORIDE 10 MEQ/50ML IV SOLN: 10.0000 meq | INTRAVENOUS | Status: DC

## 2022-11-12 MED ORDER — THIAMINE MONONITRATE 100 MG PO TABS
100.0000 mg | ORAL_TABLET | Freq: Every day | ORAL | Status: DC
  Administered 2022-11-12 – 2022-11-18 (×7): 100 mg via ORAL
  Filled 2022-11-12 (×7): qty 1

## 2022-11-12 MED ORDER — OSMOLITE 1.5 CAL PO LIQD
1000.0000 mL | ORAL | Status: DC
  Administered 2022-11-12 – 2022-11-19 (×8): 1000 mL
  Filled 2022-11-12 (×14): qty 1000

## 2022-11-12 MED ORDER — JUVEN PO PACK
1.0000 | PACK | Freq: Two times a day (BID) | ORAL | Status: DC
  Administered 2022-11-12 – 2022-11-20 (×16): 1
  Filled 2022-11-12 (×15): qty 1

## 2022-11-12 MED ORDER — OLANZAPINE 5 MG PO TABS
20.0000 mg | ORAL_TABLET | Freq: Every day | ORAL | Status: DC
  Administered 2022-11-12 – 2022-11-18 (×7): 20 mg via ORAL
  Filled 2022-11-12: qty 2
  Filled 2022-11-12 (×7): qty 4

## 2022-11-12 MED ORDER — FUROSEMIDE 10 MG/ML IJ SOLN
40.0000 mg | Freq: Once | INTRAMUSCULAR | Status: AC
  Administered 2022-11-12: 40 mg via INTRAVENOUS
  Filled 2022-11-12: qty 4

## 2022-11-12 NOTE — Consult Note (Addendum)
NAME:  [name], MRN:  [id number], DOB:  [birth year], LOS: 2 ADMISSION DATE:  11/10/2022, CONSULTATION DATE:  11/11/22 REFERRING MD:  Dr. Thedore Mins, CHIEF COMPLAINT:  Weakness/falls   Brief History   [name] is a 72 y.o. person living with a history of alcohol use disorder, HTN, HFpEF, schizophrenia, anxiety/depression, BPH, PTSD, tobacco use disorder, unhomed presented with generalized weakness and frequent falls.    History of present illness   72 y/o person presented with generalized weakness and frequent falls. Initially admitted to triad hospitalist service for sepsis secondary to infected bilateral stage II pressure ulcers of the buttocks. He was started on ceftriaxone. Overnight he became agitated and received ativan for this. He then developed metabolic and toxic encephalopathy with concern for his inability to protect his airway. Transferred to the ICU for further management of his hypercarbic/hypoxic respiratory failure.   Past Medical History  Alcohol use Hypertension Chronic HFpEF Schizophrenia Anxiety/depression Unhomed PTSD BPH  Significant Hospital Events   4/9 - admitted for sepsis thought to be secondary to sacral ulcers 4/10 - transferred to the ICU, BiPAP 4/10 - off bipap, on nasal cannula   Consults:  PCCM  Procedures:  N/A  Significant Diagnostic Tests:  4/10 - chest xray - airspace opacities in the left mid/lower lung fields and right lobes  Micro Data:  4/10 - blood cultures no growth at 24 hours 4/10 - MRSA nasal swab negative  Antimicrobials:  Day 3 ceftriaxone   Interim history/subjective:  Able to titrate off of bipap overnight and breathing well on nasal cannula. Alert and oriented this morning, feels better.   Objective   Blood pressure 111/61, pulse 81, temperature 97.6 F (36.4 C), temperature source Axillary, resp. rate (!) 25, height 6\' 2"  (1.88 m), weight 96.6 kg, SpO2 99 %.    Vent Mode: BIPAP FiO2 (%):  [30 %-60 %] 30  % Set Rate:  [12 bmp] 12 bmp PEEP:  [5 cmH20] 5 cmH20 Plateau Pressure:  [5 cmH20-6 cmH20] 6 cmH20   Intake/Output Summary (Last 24 hours) at 11/12/2022 0733 Last data filed at 11/12/2022 0200 Gross per 24 hour  Intake 1811.17 ml  Output 2250 ml  Net -438.83 ml   Filed Weights   11/10/22 1123 11/12/22 0500  Weight: 86.2 kg 96.6 kg   Examination: Constitutional: comfortable HENT: normocephalic atraumatic Eyes: conjunctiva non-erythematous Neck: supple Cardiovascular: regular rate and rhythm, no m/r/g. Pulmonary/Chest: normal work of breathing on nasal cannula Abdominal: soft, non-tender, non-distended MSK: normal bulk and tone. 1+ edema lower extremities Neurological: alert and oriented x 3 and following commands Skin: warm and dry.  Assessment & Plan:   [name] is a 72 y/o person living with a history of alcohol use disorder, HTN, HFpEF, schizophrenia, anxiety/depression, BPH, PTSD, tobacco use disorder, unhomed presented with generalized weakness and frequent falls admitted for sepsis secondary to sacral ulcers. Transferred to the ICU for worsening mentation and concern for respiratory failure  Acute hypercarbic/hypoxic respiratory failure Resolved respiratory failure. Likely iatrogenic from sedating medications. Significant weight gain in last two days, hypervolemic on exam. Will give diuretics. Not on home oxygen - lasix 40 mg once, monitor weights and respiratory status  Hx of HFpEF Significantly hypervolemic today, will give dose of lasix and monitor weights and electrolytes. -lasix 40 mg  -not on SGLT-2i  AKI Baseline Cr under 1.0. 1.28 today. Likely in setting of hypervolemia. Continue to diurese and monitor renal function.   Urinary retention Significant urinary retention overnight, may have  contributed to encephalopathy. Will need frequent bladder scans and foley if needed.   Acute encephalopathy Resolved, in setting of receiving ativan and respiratory  failure  Chronic sacral ulcers  Concern for cellulitis Concern for surrounding cellulitis and possible infected ulcers. Continue abx and follow blood cultures.  -ceftriaxone day 3/7 -continue hydrotherapy will need pain regimen before treatments -follow blood cultures  Alcohol use disorder Denies continued heavy alcohol use. Can keep on CIWA and start librium taper if need be on the floor.   Normocytic anemia Anemia resolved.   Electrolyte abnormalities Hyponatremia resolved. Will give potassium with diuretics today.  -repeat BMP tomorrow  BPH Does not appear to be on any medications at home. Acute urinary retention yesterday. Monitor bladder scans and foley if need be. Can start treating if need be.   Schizophrenia -restart home zyprexa 20 mg and buspar 15 mg TID today  PTSD -restart prazosin if blood pressures stabilize  Tobacco use -encourage cessation -nicotine patches as needed  Hypoglycemia Feeding tube placed yesterday to off put hypoglycemia while on bipap. Now that awake and alert if able to eat/drink can remove feeding tube and start diet.   Best practice:  Diet: Cortrak Pain/Anxiety/Delirium protocol: Fentanyl during hydrotherapy. DVT prophylaxis: lovenox GI prophylaxis: none at this time Glucose control: none Mobility: pending Code Status: full Disposition: transfer back to progressive today for ongoing treatment Labs   CBC: Recent Labs  Lab 11/09/22 1420 11/10/22 1404 11/11/22 0412 11/11/22 1332 11/12/22 0548  WBC 13.7* 16.5* 11.6*  --  8.7  NEUTROABS 10.8* 13.4*  --   --  6.9  HGB 13.5 12.8* 12.5* 11.6* 15.0  HCT 42.0 38.3* 36.6* 34.0* 47.0  MCV 93.5 94.1 91.3  --  93.8  PLT 346 342 327  --  251     Basic Metabolic Panel: Recent Labs  Lab 11/09/22 1530 11/10/22 1404 11/11/22 0412 11/11/22 1332 11/11/22 1649 11/12/22 0548  NA 134* 138 133* 136  --  139  K 4.1 3.8 3.5 3.5  --  3.6  CL 98 96* 98  --   --  100  CO2 27 27 25   --   --   32  GLUCOSE 84 72 95  --   --  105*  BUN 17 18 16   --   --  18  CREATININE 0.85 1.22 1.01  --   --  1.28*  CALCIUM 8.4* 8.9 8.5*  --   --  8.5*  MG  --   --   --   --  2.1 2.2  PHOS  --   --   --   --  3.9 3.9    GFR: Estimated Creatinine Clearance: 62.4 mL/min (A) (by C-G formula based on SCr of 1.28 mg/dL (H)). Recent Labs  Lab 11/09/22 1420 11/10/22 1404 11/11/22 0412 11/12/22 0548  PROCALCITON  --   --  0.39  --   WBC 13.7* 16.5* 11.6* 8.7  LATICACIDVEN  --  1.4  --   --      Liver Function Tests: Recent Labs  Lab 11/09/22 1530 11/10/22 1404 11/11/22 0412  AST 37 36 29  ALT 21 23 22   ALKPHOS 48 56 48  BILITOT 1.9* 1.6* 1.0  PROT 6.2* 6.0* 5.2*  ALBUMIN 2.7* 2.4* 1.9*    No results for input(s): "LIPASE", "AMYLASE" in the last 168 hours. No results for input(s): "AMMONIA" in the last 168 hours.  ABG    Component Value Date/Time   PHART 7.406 11/11/2022  1332   PCO2ART 45.5 11/11/2022 1332   PO2ART 133 (H) 11/11/2022 1332   HCO3 28.8 (H) 11/11/2022 1332   TCO2 30 11/11/2022 1332   O2SAT 99 11/11/2022 1332     Coagulation Profile: Recent Labs  Lab 11/10/22 2018  INR 1.2     Cardiac Enzymes: Recent Labs  Lab 11/10/22 2018  CKTOTAL 163     HbA1C: Hgb A1c MFr Bld  Date/Time Value Ref Range Status  01/02/2022 03:48 PM 5.6 4.8 - 5.6 % Final    Comment:             Prediabetes: 5.7 - 6.4          Diabetes: >6.4          Glycemic control for adults with diabetes: <7.0   10/23/2020 02:25 PM 5.5 4.8 - 5.6 % Final    Comment:             Prediabetes: 5.7 - 6.4          Diabetes: >6.4          Glycemic control for adults with diabetes: <7.0     CBG: Recent Labs  Lab 11/11/22 1506 11/11/22 1901 11/11/22 2311 11/12/22 0255 11/12/22 0723  GLUCAP 69* 129* 133* 132* 111*     Review of Systems:   Unable to interpret while on bipap  Past Medical History  He,  has a past medical history of Acute urinary retention, Anxiety, Brain injury  (1975), Complication of anesthesia (08/29/13), Difficult intubation (08/31/13), Head injury (1975), Manic depression, Pelvic fracture (09/21/13), Schizo affective schizophrenia, and Schizophrenia.   Surgical History    Past Surgical History:  Procedure Laterality Date   COSMETIC SURGERY Left 1975   created new ear lobe   DRESSING CHANGE UNDER ANESTHESIA Left 08/31/2013   Procedure: DRESSING CHANGE UNDER ANESTHESIA for right ankle and left thigh;  Surgeon: Budd PalmerMichael H Handy, MD;  Location: [facility] OR;  Service: Orthopedics;  Laterality: Left;   I & D EXTREMITY Bilateral 08/30/2013   Procedure: IRRIGATION AND DEBRIDEMENT with closure Left Thigh wound, Irrigation and debridement Right Ankle ;  Surgeon: Harvie JuniorJohn L Graves, MD;  Location: [facility] OR;  Service: Orthopedics;  Laterality: Bilateral;   I & D EXTREMITY Left 09/26/2013   Procedure: LEFT IRRIGATION AND DEBRIDEMENT Robby SermonEXTREMITY/WOUND VAC;  Surgeon: Budd PalmerMichael H Handy, MD;  [facility];  Service: Orthopedics;  Laterality: Left;   I & D EXTREMITY Left 10/02/2013   Procedure: IRRIGATION AND DEBRIDEMENT EXTREMITY;  Surgeon: Budd PalmerMichael H Handy, MD;  Location: [facility] OR;  Service: Orthopedics;  Laterality: Left;   INCISION AND DRAINAGE OF WOUND Left 09/21/2013   Procedure: IRRIGATION AND DEBRIDEMENT SOFT TISSUE WOUND WITH LARGE WOUND VAC PLACEMENT;  Surgeon: Budd PalmerMichael H Handy, MD;  Location: [facility] OR;  Service: Orthopedics;  Laterality: Left;   MANDIBLE FRACTURE SURGERY  1975   SACRO-ILIAC PINNING Left 08/31/2013   Procedure: LEFT SACRO-ILIAC PINNING;  Surgeon: Budd PalmerMichael H Handy, MD;  [facility];  Service: Orthopedics;  Laterality: Left;   SKIN GRAFT     SKIN GRAFT Left 10/31/2013   left thigh      DR HANDY    SKIN SPLIT GRAFT Left 10/02/2013   Procedure: SKIN GRAFT SPLIT THICKNESS;  Surgeon: Budd PalmerMichael H Handy, MD;  [facility];  Service: Orthopedics;  Laterality: Left;   SKIN SPLIT GRAFT Left 10/31/2013   Procedure: LEFT THIGH SKIN GRAFT SPLIT THICKNESS;  Surgeon: Budd PalmerMichael H  Handy, MD;  [facility];  Service:  Orthopedics;  Laterality: Left;     Social History   reports that he has been smoking cigars. He has never used smokeless tobacco. He reports current alcohol use of about 6.0 standard drinks of alcohol per week. He reports that he does not use drugs.   Family History   His family history includes Bone cancer in his father; Diabetes in his mother; Obsessive Compulsive Disorder in his father. There is no history of Colon cancer, Rectal cancer, Liver cancer, or Pancreatic cancer.   Allergies Allergies  Allergen Reactions   Penicillins Other (See Comments)    Unsure of reaction, had allergic reaction as a child and has not taken since then     Home Medications  Prior to Admission medications   Medication Sig Start Date End Date Taking? Authorizing Provider  bisoprolol (ZEBETA) 5 MG tablet Take 1 tablet (5 mg total) by mouth daily. 07/13/22   Claiborne Rigg, NP  busPIRone (BUSPAR) 15 MG tablet Take 1 tablet (15 mg total) by mouth 3 (three) times daily. 08/27/22   Park Pope, MD  ezetimibe (ZETIA) 10 MG tablet Take 1 tablet (10 mg total) by mouth daily. 10/05/22   Chandrasekhar, Mahesh A, MD  lisinopril (ZESTRIL) 5 MG tablet Take 1 tablet (5 mg total) by mouth daily. To lower blood pressure 08/18/22   Claiborne Rigg, NP  mupirocin ointment (BACTROBAN) 2 % Apply 1 Application topically 2 (two) times daily. 05/18/22   Claiborne Rigg, NP  OLANZapine (ZYPREXA) 20 MG tablet Take 1 tablet (20 mg total) by mouth at bedtime. 08/27/22   Park Pope, MD  prazosin (MINIPRESS) 1 MG capsule Take 1 capsule (1 mg total) by mouth at bedtime. 08/27/22   Park Pope, MD  rosuvastatin (CRESTOR) 40 MG tablet Take 1 tablet (40 mg total) by mouth daily. 03/17/22   Sharlene Dory, NP    Thalia Bloodgood DO  Internal Medicine Resident PGY-3 [place]  Pager: [phone]

## 2022-11-12 NOTE — Evaluation (Signed)
Clinical/Bedside Swallow Evaluation Patient Details  Name: [name] MRN: [id number] Date of Birth: 08-Jan-1951  Today's Date: 11/12/2022 Time: SLP Start Time (ACUTE ONLY): 1230 SLP Stop Time (ACUTE ONLY): 1252 SLP Time Calculation (min) (ACUTE ONLY): 22 min  Past Medical History:  Past Medical History:  Diagnosis Date   Acute urinary retention    Anxiety    Brain injury 1975   Complication of anesthesia 08/29/13   difficulty due to large tongue, anterior larynx and limited opening   Difficult intubation 08/31/13   Head injury 1975   "Brain Stem Contusion"   Manic depression    Pelvic fracture 09/21/13   Inferior Pubic Ramus, Left Superior Ramus   Schizo affective schizophrenia    Schizophrenia    Past Surgical History:  Past Surgical History:  Procedure Laterality Date   COSMETIC SURGERY Left 1975   created new ear lobe   DRESSING CHANGE UNDER ANESTHESIA Left 08/31/2013   Procedure: DRESSING CHANGE UNDER ANESTHESIA for right ankle and left thigh;  Surgeon: Budd Palmer, MD;  Location: [facility] OR;  Service: Orthopedics;  Laterality: Left;   I & D EXTREMITY Bilateral 08/30/2013   Procedure: IRRIGATION AND DEBRIDEMENT with closure Left Thigh wound, Irrigation and debridement Right Ankle ;  Surgeon: Harvie Junior, MD;  Location: [facility] OR;  Service: Orthopedics;  Laterality: Bilateral;   I & D EXTREMITY Left 09/26/2013   Procedure: LEFT IRRIGATION AND DEBRIDEMENT Robby Sermon;  Surgeon: Budd Palmer, MD;  Location[facility] OR;  Service: Orthopedics;  Laterality: Left;   I & D EXTREMITY Left 10/02/2013   Procedure: IRRIGATION AND DEBRIDEMENT EXTREMITY;  Surgeon: Budd Palmer, MD;  Location: [facility] OR;  Service: Orthopedics;  Laterality: Left;   INCISION AND DRAINAGE OF WOUND Left 09/21/2013   Procedure: IRRIGATION AND DEBRIDEMENT SOFT TISSUE WOUND WITH LARGE WOUND VAC PLACEMENT;  Surgeon: Budd Palmer, MD;  Location: [facility];  Service: Orthopedics;  Laterality: Left;   MANDIBLE FRACTURE  SURGERY  1975   SACRO-ILIAC PINNING Left 08/31/2013   Procedure: LEFT SACRO-ILIAC PINNING;  Surgeon: Budd Palmer, MD;  Location: [facility] OR;  Service: Orthopedics;  Laterality: Left;   SKIN GRAFT     SKIN GRAFT Left 10/31/2013   left thigh      DR HANDY    SKIN SPLIT GRAFT Left 10/02/2013   Procedure: SKIN GRAFT SPLIT THICKNESS;  Surgeon: Budd Palmer, MD;  Location: [facility] OR;  Service: Orthopedics;  Laterality: Left;   SKIN SPLIT GRAFT Left 10/31/2013   Procedure: LEFT THIGH SKIN GRAFT SPLIT THICKNESS;  Surgeon: Budd Palmer, MD;  Location: [facility] OR;  Service: Orthopedics;  Laterality: Left;   HPI:  [name] is a 72 y.o. male with medical history significant of alcohol abuse, HTN and chronic HFpEF, schizophrenia, anxiety/depression, homeless, presented with generalized weakness and frequent falls on 11/10/22.   Patient reported that for the last 2 to 3 days has been feeling generalized weakness, with frequent falls and as a result he has been very much bedbound for the last 2 days and not been eating or drinking much, further made him very weak.  He came to the ED for multiple complaints including cough, bilateral shoulder pain, bilateral leg weakness, and wound on his buttocks.  TF placed d/t hypoglycemia/AMS; BSE generated to assess swallow function.    Assessment / Plan / Recommendation  Clinical Impression  Pt seen for clinical swallowing evaluation with various consistencies and a cognitive-based oropharyngeal dysphagia observed d/t decreased  sustained attention impacted by mental illness dx with impulsivity noted throughout CSE.  Pt adequately orally prepared boluses, but would attempt to speak during consumption which resulted in immediate or delayed cough with thin liquids via spoon/cup and/or ice chips.  Nectar-thickened liquids did not result in coughing during consumption.  Impaired mastication with solids noted d/t poor oral hygiene and also impacted by impulsivity impacting judgment with  bite size.  Pt required min-mod verbal cues to follow safety precautions during intake.  Recommend initiate a conservative diet of Dysphagia 2(minced)/nectar-thickened liquids with no straws, FULL precautions and intermittent A with meals.  Medications whole/puree for safety reasons.  ST will f/u for diet tolerance/education during acute stay.  Thank you for this consult. SLP Visit Diagnosis: Dysphagia, oropharyngeal phase (R13.12)    Aspiration Risk  Mild aspiration risk;Moderate aspiration risk    Diet Recommendation   Dysphagia 2(minced)/nectar-thickened liquids   Medication Administration: Whole meds with puree    Other  Recommendations Oral Care Recommendations: Oral care BID;Staff/trained caregiver to provide oral care Caregiver Recommendations: Avoid jello, ice cream, thin soups, popsicles    Recommendations for follow up therapy are one component of a multi-disciplinary discharge planning process, led by the attending physician.  Recommendations may be updated based on patient status, additional functional criteria and insurance authorization.  Follow up Recommendations Follow physician's recommendations for discharge plan and follow up therapies      Assistance Recommended at Discharge    Functional Status Assessment Patient has had a recent decline in their functional status and demonstrates the ability to make significant improvements in function in a reasonable and predictable amount of time.  Frequency and Duration min 2x/week  1 week       Prognosis Prognosis for improved oropharyngeal function: Good Barriers to Reach Goals: Cognitive deficits      Swallow Study   General Date of Onset: 11/10/22 HPI: [name] is a 72 y.o. male with medical history significant of alcohol abuse, HTN and chronic HFpEF, schizophrenia, anxiety/depression, homeless, presented with generalized weakness and frequent falls.     Patient reported that for the last 2 to 3 days has been  feeling generalized weakness, with frequent falls and as a result he has been very much bedbound for the last 2 days and not been eating or drinking much, further made him very weak.  He came to the ED for the first time last night for multiple complaints including cough bilateral shoulder pain bilateral leg weakness, and wound on his buttocks.  TF placed d/t hypoglycemia/AMS; BSE generated to assess swallow function. Type of Study: Bedside Swallow Evaluation Previous Swallow Assessment: n/a Diet Prior to this Study: NPO Temperature Spikes Noted: No Respiratory Status: Room air History of Recent Intubation: No Behavior/Cognition: Alert;Cooperative;Distractible;Impulsive Oral Cavity Assessment: Dry Oral Care Completed by SLP: Yes Oral Cavity - Dentition: Adequate natural dentition;Missing dentition Vision: Functional for self-feeding Self-Feeding Abilities: Able to feed self;Needs assist Patient Positioning: Upright in bed Baseline Vocal Quality: Other (comment) (dysarthric) Volitional Cough: Strong Volitional Swallow: Able to elicit    Oral/Motor/Sensory Function Overall Oral Motor/Sensory Function: Generalized oral weakness Lingual ROM: Reduced right;Reduced left Lingual Symmetry: Within Functional Limits   Ice Chips Ice chips: Impaired Presentation: Spoon Pharyngeal Phase Impairments: Cough - Immediate   Thin Liquid Thin Liquid: Impaired Presentation: Cup;Spoon Pharyngeal  Phase Impairments: Cough - Immediate;Cough - Delayed    Nectar Thick Nectar Thick Liquid: Impaired Presentation: Cup Oral phase functional implications: Other (comment) (inattention/decreased sustained attention) Pharyngeal Phase Impairments: Other (  comments) (impulsive)   Honey Thick Honey Thick Liquid: Not tested   Puree Puree: Impaired Presentation: Spoon Pharyngeal Phase Impairments: Other (comments) (impulsive)   Solid     Solid: Impaired Presentation: Self Fed Oral Phase Impairments: Impaired  mastication Oral Phase Functional Implications: Prolonged oral transit;Impaired mastication Other Comments: Impulsive      Pat Jaclynne Baldo,M.S., CCC-SLP 11/12/2022,1:10 PM

## 2022-11-12 NOTE — Progress Notes (Signed)
Initial Nutrition Assessment  DOCUMENTATION CODES:  Not applicable  INTERVENTION:  Adjust TF to the following via cortrak tube: Osmolite 1.5 at 65 ml/h (1560 ml per day) Start at 50 and advance to goal in 8h Prosource TF20 60 ml BID Provides 2500 kcal, 138 gm protein, 1189 ml free water daily 1 packet Juven BID, each packet provides 95 calories, 2.5 grams of protein (collagen), and 9.8 grams of carbohydrate (3 grams sugar); also contains 7 grams of L-arginine and L-glutamine, 300 mg vitamin C, 15 mg vitamin E, 1.2 mcg vitamin B-12, 9.5 mg zinc, 200 mg calcium, and 1.5 g  Calcium Beta-hydroxy-Beta-methylbutyrate to support wound healing  NUTRITION DIAGNOSIS:  Increased nutrient needs related to wound healing as evidenced by estimated needs.  GOAL:  Patient will meet greater than or equal to 90% of their needs  MONITOR:  TF tolerance, Skin, I & O's, Diet advancement, Labs  REASON FOR ASSESSMENT:  Consult Enteral/tube feeding initiation and management  ASSESSMENT:  Pt with hx of HTN, CHF schizophrenia, homelessness, and EtOH abuse presented to ED with weakness and with a recent fall. Pt with sepsis on admission related to large wound to the buttocks.  4/10 - cortrak placed (post-pyloric)  Pt resting in bed at the time of assessment. Cortrak tube was placed yesterday after pt began having hypoglycemia. Currently running at goal rate of 58mL/h per protocol. Still requiring D10. Pt able to come off of BiPAP yesterday and is more alert today. SLP to evaluate swallowing. If pt able to meet his nutrition needs, attending ok with removing cortrak.   Would be in favor of leaving cortrak until pt has demonstrated consistent adequate intake. Has significantly elevated nutrition needs due to wound healing. Underwent hydrotherapy yesterday.   If pt does have diet advanced, would benefit from double protein portions on tray to provide adequate nutrition. Will also add juven to provide increase  micronutrients for wound healing.   Intake/Output Summary (Last 24 hours) at 11/12/2022 1129 Last data filed at 11/12/2022 1000 Gross per 24 hour  Intake 1788.63 ml  Output 3700 ml  Net -1911.37 ml  Net IO Since Admission: -1,026.3 mL [11/12/22 1129]  Nutritionally Relevant Medications: Scheduled Meds:  ezetimibe  10 mg Oral Daily   PROSource TF20  60 mL Per Tube Daily   multivitamin with minerals  1 tablet Oral Daily   rosuvastatin  40 mg Oral Daily   thiamine  100 mg Intravenous Daily   Continuous Infusions:  cefTRIAXone (ROCEPHIN)  IV Stopped (11/11/22 1626)   dextrose 30 mL/hr at 11/12/22 0200   feeding supplement (OSMOLITE 1.2 CAL) 50 mL/hr at 11/12/22 0200   PRN Meds: ondansetron  Labs Reviewed: Creatinine 1.28 CBG ranges from 69-133 mg/dL over the last 24 hours  NUTRITION - FOCUSED PHYSICAL EXAM: Flowsheet Row Most Recent Value  Orbital Region No depletion  Upper Arm Region No depletion  Thoracic and Lumbar Region No depletion  Buccal Region No depletion  Temple Region No depletion  Clavicle Bone Region No depletion  Clavicle and Acromion Bone Region Mild depletion  Scapular Bone Region Mild depletion  Dorsal Hand No depletion  Patellar Region No depletion  Anterior Thigh Region No depletion  Posterior Calf Region No depletion  Edema (RD Assessment) None  Hair Reviewed  [matted]  Eyes Unable to assess  [sleeping]  Mouth Unable to assess  [bipap]  Skin Reviewed  Nails Reviewed  [long]    Diet Order:   Diet Order  Diet NPO time specified Except for: Sips with Meds  Diet effective now                   EDUCATION NEEDS:  Not appropriate for education at this time  Skin:  Skin Assessment: Reviewed RN Assessment Undergoing hydrotherapy for buttocks wounds L buttock 9 cms x 7 cms  R buttock 5 cms x 9 cms  Bilateral knees partial thickness, likely traumatic; L knee 3 cms x 2 cms R knee 2 cms x 3 cms   Last BM:  4/9  Height:  Ht  Readings from Last 1 Encounters:  11/10/22 6\' 2"  (1.88 m)    Weight:  Wt Readings from Last 1 Encounters:  11/12/22 96.6 kg    Ideal Body Weight:  86.4 kg  BMI:  Body mass index is 27.34 kg/m.  Estimated Nutritional Needs:  Kcal:  2500-2800 kcal/d Protein:  125-150 g/d Fluid:  2.5L/d    Greig Castilla, RD, LDN Clinical Dietitian RD pager # available in [facility]  After hours/weekend pager # available in [facility]

## 2022-11-12 NOTE — Progress Notes (Signed)
  RE:  [name]       Date of Birth:  02-Aug-2051     Date:   11/12/22       To Whom It May Concern:  Please be advised that the above-named patient will require a short-term nursing home stay - anticipated 30 days or less for rehabilitation and strengthening.  The plan is for return home.                 MD signature                Date
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
## 2022-11-13 ENCOUNTER — Inpatient Hospital Stay (HOSPITAL_COMMUNITY): Payer: Medicare Other

## 2022-11-13 DIAGNOSIS — A419 Sepsis, unspecified organism: Secondary | ICD-10-CM | POA: Diagnosis not present

## 2022-11-13 DIAGNOSIS — L98419 Non-pressure chronic ulcer of buttock with unspecified severity: Secondary | ICD-10-CM | POA: Diagnosis not present

## 2022-11-13 DIAGNOSIS — W19XXXA Unspecified fall, initial encounter: Secondary | ICD-10-CM

## 2022-11-13 LAB — COMPREHENSIVE METABOLIC PANEL
ALT: 18 U/L (ref 0–44)
AST: 21 U/L (ref 15–41)
Albumin: 1.6 g/dL — ABNORMAL LOW (ref 3.5–5.0)
Alkaline Phosphatase: 41 U/L (ref 38–126)
Anion gap: 7 (ref 5–15)
BUN: 22 mg/dL (ref 8–23)
CO2: 32 mmol/L (ref 22–32)
Calcium: 8.1 mg/dL — ABNORMAL LOW (ref 8.9–10.3)
Chloride: 98 mmol/L (ref 98–111)
Creatinine, Ser: 0.95 mg/dL (ref 0.61–1.24)
GFR, Estimated: >60 mL/min (ref >60)
Glucose, Bld: 141 mg/dL — ABNORMAL HIGH (ref 70–99)
Potassium: 3.8 mmol/L (ref 3.5–5.1)
Sodium: 137 mmol/L (ref 135–145)
Total Bilirubin: 0.5 mg/dL (ref 0.3–1.2)
Total Protein: 4.8 g/dL — ABNORMAL LOW (ref 6.5–8.1)

## 2022-11-13 LAB — MAGNESIUM: Magnesium: 1.9 mg/dL (ref 1.7–2.4)

## 2022-11-13 LAB — CBC
HCT: 33.3 % — ABNORMAL LOW (ref 39.0–52.0)
Hemoglobin: 11 g/dL — ABNORMAL LOW (ref 13.0–17.0)
MCH: 30.8 pg (ref 26.0–34.0)
MCHC: 33 g/dL (ref 30.0–36.0)
MCV: 93.3 fL (ref 80.0–100.0)
Platelets: 283 10*3/uL (ref 150–400)
RBC: 3.57 MIL/uL — ABNORMAL LOW (ref 4.22–5.81)
RDW: 13.7 % (ref 11.5–15.5)
WBC: 11 10*3/uL — ABNORMAL HIGH (ref 4.0–10.5)
nRBC: 0 % (ref 0.0–0.2)

## 2022-11-13 LAB — GLUCOSE, CAPILLARY
Glucose-Capillary: 108 mg/dL — ABNORMAL HIGH (ref 70–99)
Glucose-Capillary: 165 mg/dL — ABNORMAL HIGH (ref 70–99)

## 2022-11-13 LAB — PHOSPHORUS: Phosphorus: 2.5 mg/dL (ref 2.5–4.6)

## 2022-11-13 LAB — CULTURE, BLOOD (ROUTINE X 2)

## 2022-11-13 MED ORDER — MIDODRINE HCL 5 MG PO TABS
10.0000 mg | ORAL_TABLET | Freq: Three times a day (TID) | ORAL | Status: DC
  Administered 2022-11-13 – 2022-11-18 (×18): 10 mg via ORAL
  Filled 2022-11-13 (×19): qty 2

## 2022-11-13 MED ORDER — LACTATED RINGERS IV BOLUS
500.0000 mL | Freq: Once | INTRAVENOUS | Status: AC
  Administered 2022-11-13: 500 mL via INTRAVENOUS

## 2022-11-13 MED ORDER — METRONIDAZOLE 500 MG/100ML IV SOLN
500.0000 mg | Freq: Two times a day (BID) | INTRAVENOUS | Status: DC
  Administered 2022-11-13 – 2022-11-20 (×15): 500 mg via INTRAVENOUS
  Filled 2022-11-13 (×15): qty 100

## 2022-11-13 MED ORDER — MORPHINE SULFATE (PF) 2 MG/ML IV SOLN
1.0000 mg | Freq: Every day | INTRAVENOUS | Status: DC | PRN
  Administered 2022-11-15 – 2022-11-21 (×5): 1 mg via INTRAVENOUS
  Filled 2022-11-13 (×5): qty 1

## 2022-11-13 NOTE — Progress Notes (Signed)
Speech Language Pathology Treatment: Dysphagia  Patient Details Name: [name] MRN: [id number] DOB: [birth year] Today's Date: 11/13/2022 Time: 0034-9179 SLP Time Calculation (min) (ACUTE ONLY): 35 min  Assessment / Plan / Recommendation Clinical Impression  Patient today demonstrates severe dysarthria causing him to be intelligible only single words or short phrases expression.  His language is intact however.  Provided patient with toothbrushing and making brushed his which improved his intelligibility.  He endorses a history of dysphagia causing him to cough more with food and liquid, stating he does not masticate well.  Today patient observed with single ice chips, thin water,  thin cranberry juice, nectar thick cranberry juice and applesauce.  Initially patient tolerated p.o.'s including approximately 2 ounces of water approximately 3 ounces of cranberry juice container of applesauce.  However upon swallowing) and juice via straw coughing episode, suspect this is due to premature spillage of liquid into airway due to poor lingual control.  Suspect patient's pharyngeal swallow is intact but cannot warrantee this.  Spoke with Dr. Jerral Ralph who approved MBS, and contacted radiology department to schedule.  Was advised to call later due to fluoroscopy tech not being present.  Will modify diet to full liquid nectar thick only allowing teaspoons of thin water and single ice chips pending MBS.  Patient admits to history of pneumonia and some weight loss which she attributes more to lack of access to food rather than dysphagia.  He has not required Heimlich maneuver past and denies issues swallowing pills.  Patient informed and agreeable to plan for MBS today as xray is able to schedule.     HPI HPI: HUGHES KRAPP is a 72 y.o. male with medical history significant of alcohol abuse, HTN and chronic HFpEF, schizophrenia, anxiety/depression, homeless, presented with generalized weakness and frequent falls.      Patient reported that for the last 2 to 3 days has been feeling generalized weakness, with frequent falls and as a result he has been very much bedbound for the last 2 days and not been eating or drinking much, further made him very weak.  He came to the ED for the first time last night for multiple complaints including cough bilateral shoulder pain bilateral leg weakness, and wound on his buttocks.  TF placed d/t hypoglycemia/AMS; BSE generated to assess swallow function.      SLP Plan  MBS;Continue with current plan of care      Recommendations for follow up therapy are one component of a multi-disciplinary discharge planning process, led by the attending physician.  Recommendations may be updated based on patient status, additional functional criteria and insurance authorization.    Recommendations  Diet recommendations: Nectar-thick liquid (nectar liquids and single ice chips/tsps thin water only pending mbs) Liquids provided via: Cup;Straw Medication Administration: Whole meds with puree Supervision: Full supervision/cueing for compensatory strategies Compensations: Slow rate;Small sips/bites Postural Changes and/or Swallow Maneuvers: Seated upright 90 degrees;Upright 30-60 min after meal                  Oral care BID;Staff/trained caregiver to provide oral care     Dysphagia, oropharyngeal phase (R13.12)     MBS;Continue with current plan of care   Rolena Infante, MS [facility] SLP Acute Rehab Services Office [phone]   Chales Abrahams  11/13/2022, 8:54 AM

## 2022-11-13 NOTE — Procedures (Addendum)
Modified Barium Swallow Study  Patient Details  Name: [name] MRN: [id number] Date of Birth: 20-Sep-1950  Today's Date: 11/13/2022  Modified Barium Swallow completed.  Full report located under Chart Review in the Imaging Section.  History of Present Illness [name] is a 72 y.o. male with medical history significant of alcohol abuse, HTN and chronic HFpEF, schizophrenia, anxiety/depression, homeless, presented with generalized weakness and frequent falls.     Patient reported that for the last 2 to 3 days has been feeling generalized weakness, with frequent falls and as a result he has been very much bedbound for the last 2 days and not been eating or drinking much, further made him very weak.  He came to the ED for the first time last night for multiple complaints including cough bilateral shoulder pain bilateral leg weakness, and wound on his buttocks.  TF placed d/t hypoglycemia/AMS; BSE generated to assess swallow function.   Clinical Impression Pt presents with a moderate oropharyngeal dysphagia c/b premature spillage, delayed swallow initiation, decreased base of tongue retraction, minimal epiglottic inversion, incomplete laryngeal closure, decreased pharyngeal stripping wave, reduced UES openining, and diminished sensation. These deficits resulted in frequent penetration and inconsistent aspiration of all consistencies of liquids.  Aspiration was inconsistently sensed.  Pt was unable to clear penetration/aspiration with reflexive cough.  Cued cough attempted during an episode of silent aspiration of NTL, however pt declined to complete maneuver stating that he didn't need to cough.  There was deep penetration of puree after the swallow to the level of the vocal folds that was cleared with spontaneous secondary swallow. There was epiglottic approximation to posterior pharyngeal wall without inversion.  Epiglottis did not appear to reach horizontal position. Pt with neck extension  and cortack that may have impacted inversion, but epiglottis also appears somewhat inflexible. Suspect some deficits may be chronic; however, given pt's acute illness he may have lost some compensatory ability. There was a protrusion in cervical esophagus which appeared c/w a CP bar (see image below), which did not prevent bolus passage.  There was retention of pill in esophagus during sweep, which was cleared by following pill with spoon full of honey thick liquid. This assessment is not diagnostic of esophagea dysphagia/dysmotility, but consider further workup of esophagus if pt c/o feeling of stasis once PO intake can be safely resumed. Pt intermittently follows directions, and may benefit for a trial of swallowing therapy to determine if he can participate in exercise program.    Recommend pt be NPO at present with alternate means of nutrition, hydration, and medication. Pt may have ice chips in moderation, after good oral care, when fully awake/alert, with upright positioning and direct supervision.  Possible CP Bar:   Factors that may increase risk of adverse event in presence of aspiration [employer]aks [employer]ance Coots 2021): Poor general health and/or compromised immunity;Presence of tubes (ETT, trach, NG, etc.) (psychiatric history, social situation)  Swallow Evaluation Recommendations Recommendations: NPO Medication Administration: Via alternative means Swallowing strategies  : Slow rate;Small bites/sips Oral care recommendations: Oral care QID (4x/day);Oral care before ice chips/water Recommended consults: Consider esophageal assessment (if indicated after safe PO intake resumes)      Kerrie Pleasure , MA, CCC-SLP Acute Rehabilitation Services Office: [phone]  11/13/2022,1:00 PM

## 2022-11-13 NOTE — Progress Notes (Addendum)
Physical Therapy Wound Treatment Patient Details  Name: [name] MRN: [id number] Date of Birth: 1951-05-23  Today's Date: 11/13/2022 Time: 4098-1191 Time Calculation (min): 29 min  Subjective  Subjective Assessment Subjective: Pt asking for cigarettes Patient and Family Stated Goals: None stated Date of Onset:  (Unknown) Prior Treatments: Dressing changes  Pain Score:  Pt with soft BP and sleeping. Did not premedicate. Pt with some pain with initial removal of dressing.  Wound Assessment  Wound / Incision (Open or Dehisced) 11/10/22 Other (Comment) Buttocks Left Pressure wound (Active)  Dressing Type Foam - Lift dressing to assess site every shift;Gauze (Comment);Dakin's-soaked gauze;Barrier Film (skin prep);Moist to moist 11/13/22 1607  Dressing Changed Changed 11/13/22 1607  Dressing Status Clean, Dry, Intact 11/13/22 1607  Dressing Change Frequency Daily 11/13/22 1607  Site / Wound Assessment Black;Brown 11/13/22 1607  % Wound base Red or Granulating 0% 11/13/22 1607  % Wound base Yellow/Fibrinous Exudate 0% 11/13/22 1607  % Wound base Black/Eschar 100% 11/13/22 1607  % Wound base Other/Granulation Tissue (Comment) 0% 11/13/22 1607  Peri-wound Assessment Erythema (blanchable) 11/13/22 1607  Wound Length (cm) 9 cm 11/11/22 1100  Wound Width (cm) 8.2 cm 11/11/22 1100  Wound Depth (cm) 0.1 cm 11/11/22 1100  Wound Volume (cm^3) 7.38 cm^3 11/11/22 1100  Wound Surface Area (cm^2) 73.8 cm^2 11/11/22 1100  Tunneling (cm) 0 11/11/22 1500  Undermining (cm) 0 11/11/22 1500  Margins Unattached edges (unapproximated) 11/13/22 1607  Closure None 11/13/22 1607  Drainage Amount Minimal 11/13/22 1607  Drainage Description Serosanguineous 11/13/22 1607  Treatment Debridement (Selective);Irrigation;Other (Comment) 11/13/22 1607     Wound / Incision (Open or Dehisced) 11/10/22 Other (Comment) Buttocks Right Pressure Wound (Active)  Dressing Type Foam - Lift dressing to assess site  every shift;Gauze (Comment);Dakin's-soaked gauze;Barrier Film (skin prep);Moist to moist 11/13/22 1607  Dressing Changed Changed 11/13/22 1607  Dressing Status Clean, Dry, Intact 11/13/22 1607  Dressing Change Frequency Daily 11/13/22 1607  Site / Wound Assessment Bleeding;Black;Yellow 11/13/22 1607  % Wound base Red or Granulating 40% 11/13/22 1607  % Wound base Yellow/Fibrinous Exudate 55% 11/13/22 1607  % Wound base Black/Eschar 5% 11/13/22 1607  Peri-wound Assessment Erythema (blanchable) 11/13/22 1607  Wound Length (cm) 7 cm 11/11/22 1100  Wound Width (cm) 6 cm 11/11/22 1100  Wound Depth (cm) 0.1 cm 11/11/22 1100  Wound Volume (cm^3) 4.2 cm^3 11/11/22 1100  Wound Surface Area (cm^2) 42 cm^2 11/11/22 1100  Tunneling (cm) 0 11/11/22 1500  Undermining (cm) 0 11/11/22 1500  Margins Unattached edges (unapproximated) 11/13/22 1607  Closure None 11/13/22 1607  Drainage Amount Minimal 11/13/22 1607  Drainage Description Serosanguineous 11/13/22 1607  Treatment Debridement (Selective);Irrigation;Other (Comment) 11/13/22 1607   Selective Debridement (non-excisional) Selective Debridement (non-excisional) - Location: Bilateral buttocks Selective Debridement (non-excisional) - Tools Used: Forceps, Scalpel, Scissors Selective Debridement (non-excisional) - Tissue Removed: Eschar and slough. Crosshatched lt buttock wound    Wound Assessment and Plan  Wound Therapy - Assess/Plan/Recommendations Wound Therapy - Clinical Statement: Rt buttock wound with significant progress with removal of nonviable tissue. Lt buttock wound still covered by adherent eschar. This patient will benefit from continued wound therapy for selective removal of unviable tissue, to decrease bioburden, and promote wound bed healing. Wound Therapy - Functional Problem List: Decreased tolerance for OOB and positioning due to wounds Factors Delaying/Impairing Wound Healing: Infection - systemic/local, Immobility Hydrotherapy  Plan: Debridement, Dressing change, Patient/family education Wound Therapy - Frequency: 2X / week Wound Therapy - Follow Up Recommendations: dressing changes by RN  Wound Therapy Goals- Improve the function of patient's integumentary system by progressing the wound(s) through the phases of wound healing (inflammation - proliferation - remodeling) by: Wound Therapy Goals - Improve the function of patient's integumentary system by progressing the wound(s) through the phases of wound healing by: Decrease Necrotic Tissue to: 20% Decrease Necrotic Tissue - Progress: Progressing toward goal Increase Granulation Tissue to: 80% Increase Granulation Tissue - Progress: Progressing toward goal  Goals will be updated until maximal potential achieved or discharge criteria met.  Discharge criteria: when goals achieved, discharge from hospital, MD decision/surgical intervention, no progress towards goals, refusal/missing three consecutive treatments without notification or medical reason.  GP     Charges PT Wound Care Charges $Wound Debridement up to 20 cm: < or equal to 20 cm $ Wound Debridement each add'l 20 sqcm: 4 $PT Hydrotherapy Visit: 2 Visits       Angelina Ok [facility] 11/13/2022, 4:20 PM Skip Mayer PT Acute [employer] [phone]

## 2022-11-13 NOTE — Progress Notes (Signed)
I was asked to assess [name] due to his hypotension.  0250-98.64F, HR 99, 70/42 (51), RR 17 with sats 92% on RA.  Pt is alert, oriented to person, place and situation. Denies SOB. Only pain in his buttocks. Dr. Warrick Parisian ordered LR 500 cc bolus and is infusing.  0300- HR 100, 94/43 (58), RR 26 with sats 92% on RA.  Net neg approx. -4500

## 2022-11-13 NOTE — Progress Notes (Signed)
eLink Physician-Brief Progress Note Patient Name: [name] DOB: 05-Jul-1951 MRN: [id number]   Date of Service  11/13/2022  HPI/Events of Note  BP 82/43 after receiving Prazosin, most recent BNP from 3 days ago was 62.  eICU Interventions  LR 500 ml iv fluid bolus ordered, Prazosin discontinued.        Thomasene Lot Merion Grimaldo 11/13/2022, 2:39 AM

## 2022-11-13 NOTE — TOC Progression Note (Signed)
Transition of Care [facility]) - Progression Note    Patient Details  Name: [name] MRN: [id number] Date of Birth: 1951-04-29  Transition of Care [facility]) CM/SW Contact  [name], LCSW Phone Number: 11/13/2022, 3:54 PM  Clinical Narrative:    CSW following. Currently no SNF bed offers so CSW will send out referral again once Cortrak is removed. CSW uploading clinicals for [facility].   Expected Discharge Plan: Skilled Nursing Facility Barriers to Discharge: Continued Medical Work up, SNF Pending bed offer  Expected Discharge Plan and Services In-house Referral: Clinical Social Work   Post Acute Care Choice: Skilled Nursing Facility Living arrangements for the past 2 months: Homeless Shelter                                       Social Determinants of Health (SDOH) Interventions SDOH Screenings   Food Insecurity: Food Insecurity Present (11/11/2022)  Housing: High Risk (11/11/2022)  Transportation Needs: Unmet Transportation Needs (11/11/2022)  Utilities: At Risk (11/11/2022)  Alcohol Screen: Low Risk  (03/20/2022)  Depression (PHQ2-9): Low Risk  (08/27/2022)  Recent Concern: Depression (PHQ2-9) - Medium Risk (06/01/2022)  Financial Resource Strain: Medium Risk (03/18/2022)  Physical Activity: Sufficiently Active (03/20/2022)  Social Connections: Socially Isolated (03/20/2022)  Stress: No Stress Concern Present (03/20/2022)  Tobacco Use: High Risk (11/10/2022)    Readmission Risk Interventions     No data to display

## 2022-11-13 NOTE — Care Management Important Message (Signed)
Important Message  Patient Details  Name: [name] MRN: [id number] Date of Birth: [birth year]   Medicare Important Message Given:  Yes     Dorena Bodo 11/13/2022, 2:44 PM

## 2022-11-13 NOTE — Congregational Nurse Program (Addendum)
PROGRESS NOTE        PATIENT DETAILS Name: [name] Age: 72 y.o. Sex: male Date of Birth: November 26, 1950 Admit Date: 11/10/2022 Admitting Physician Emeline General, MD DPO:[email or web address], Shea Stakes, NP  Brief Summary: Patient is a 72 y.o.  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  Hospital course complicated by acute hypoxic and hypercarbic respiratory failure after getting IV Ativan requiring ICU transfer.  See below for further details.  Significant events: 4/9 >> admit to [facility]-sepsis thought to be secondary to sacral ulcers 4/10>> confused-given IV Ativan-then developed encephalopathy with hypercarbia/hypoxia -transferred to the ICU, BiPAP.  CXR with possible aspiration 4/12>> improved-transfer back to [facility].  Significant studies: 4/4>> x-ray left shoulder: Subacute/remote left humerus shaft fracture with anterior displacement. 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  Liquefied hematoma.  Bone infarct. 4/9>> CT head: No acute intracranial abnormality 4/9>> CT C-spine: No fracture 4/9>> x-ray pelvis: No fracture/dislocation-healed left-sided pubic rami fracture 4/10>> CXR: Patchy opacities in left mid/lower lung fields. 4/10>> lower extremity Doppler: No DVT.  Significant microbiology data: 4/9>> blood culture: No growth  Procedures: 4/10>>Cortrak  Consults: None  Subjective: Lying comfortably in bed-denies any chest pain or shortness of breath.  Objective: Vitals: Blood pressure (!) 94/43, pulse 91, temperature 98.4 F (36.9 C), resp. rate  15, height 6\' 2"  (1.88 m), weight 96.4 kg, SpO2 (!) 87 %.   Exam: Gen Exam:Alert awake-not in any distress.  Dysarthria which appears to be chronic.  Disheveled/frail HEENT:atraumatic, normocephalic Chest: B/L clear to auscultation anteriorly CVS:S1S2 regular Abdomen:soft non tender, non distended Extremities:+edema Neurology: Moving all 4 extremities. Skin: no rash  Pertinent Labs/Radiology:    Latest Ref Rng & Units 11/13/2022    7:11 AM 11/12/2022    5:48 AM 11/11/2022    1:32 PM  CBC  WBC 4.0 - 10.5 K/uL 11.0  8.7    Hemoglobin 13.0 - 17.0 g/dL 14.4  31.5  40.0   Hematocrit 39.0 - 52.0 % 33.3  47.0  34.0   Platelets 150 - 400 K/uL 283  251      Lab Results  Component Value Date   NA 137 11/13/2022   K 3.8 11/13/2022   CL 98 11/13/2022   CO2 32 11/13/2022      Assessment/Plan: Sepsis due to sacral decubitus ulcers Sepsis physiology overall better-slightly hypotensive overnight but asymptomatic Continue Rocephin Cultures negative so far PT to start hydrotherapy today Start midodrine as BP remains soft  Acute metabolic encephalopathy Acute hypercarbic and hypoxic respiratory failure Thought to be due to Ativan-and possible aspiration pneumonia Overall improved-either on room air or on minimal amount of oxygen.   Mentation much better-awake/alert this morning Avoid benzodiazepines as much as possible Continue Rocephin-add Flagyl for aspiration PNA  Aspiration pneumonia Likely occurred when he developed hypercarbic/hypoxic respiratory failure in the setting of Ativan use Rocephin/Flagyl SLP following  Acute urinary retention History of BPH Foley catheter in place ResumePrazosin blood pressure permits Voiding trial when able-currently not stable.  Oropharyngeal dysphagia Likely due to debility/deconditioning  Has prior history of TBI Cortrak tube in place-NG feeds ongoing SLP following with plans for possible MBS  HTN BP soft Continue to hold all  antihypertensives  Chronic HFpEF Some lower extremity edema otherwise compensated Holding diuretics/beta-blockers today  HLD Statin/Zetia  Schizophrenia/PTSD Continue Zyprexa/BuSpar Resume prazosin when BP permits  History of EtOH use Unclear if he still consumes alcohol Watch for withdrawal symptoms  Tobacco abuse Transdermal nicotine  Debility-deconditioning PT/OT eval-probable SNF  Homelessness Social worker evaluation  Nutrition Status: Nutrition Problem: Increased nutrient needs Etiology: wound healing Signs/Symptoms: estimated needs Interventions: Refer to RD note for recommendations  BMI: Estimated body mass index is 27.29 kg/m as calculated from the following:   Height as of this encounter: 6\' 2"  (1.88 m).   Weight as of this encounter: 96.4 kg.   Code status:   Code Status: Full Code   DVT Prophylaxis: enoxaparin (LOVENOX) injection 40 mg Start: 11/10/22 1800   Family Communication: None at bedside   Disposition Plan: Status is: Inpatient Remains inpatient appropriate because: Severity of illness   Planned Discharge Destination:Skilled nursing facility   Diet: Diet Order             Diet full liquid Room service appropriate? Yes; Fluid consistency: Nectar Thick  Diet effective now                     Antimicrobial agents: Anti-infectives (From admission, onward)    Start     Dose/Rate Route Frequency Ordered Stop   11/13/22 1000  metroNIDAZOLE (FLAGYL) IVPB 500 mg        500 mg 100 mL/hr over 60 Minutes Intravenous Every 12 hours 11/13/22 0840     11/10/22 1615  cefTRIAXone (ROCEPHIN) 2 g in sodium chloride 0.9 % 100 mL IVPB        2 g 200 mL/hr over 30 Minutes Intravenous Every 24 hours 11/10/22 1612 11/17/22 1614        MEDICATIONS: Scheduled Meds:  busPIRone  15 mg Oral TID   Chlorhexidine Gluconate Cloth  6 each Topical Daily   enoxaparin (LOVENOX) injection  40 mg Subcutaneous Q24H   ezetimibe  10 mg Oral Daily    feeding supplement (PROSource TF20)  60 mL Per Tube BID   leptospermum manuka honey  1 Application Topical Daily   midodrine  10 mg Oral Q8H   multivitamin with minerals  1 tablet Oral Daily   nutrition supplement (JUVEN)  1 packet Per Tube BID BM   OLANZapine  20 mg Oral QHS   rosuvastatin  40 mg Oral Daily   thiamine  100 mg Oral Daily   Continuous Infusions:  cefTRIAXone (ROCEPHIN)  IV Stopped (11/12/22 1720)   feeding supplement (OSMOLITE 1.5 CAL) 65 mL/hr at 11/12/22 1805   metronidazole     PRN Meds:.morphine injection, [DISCONTINUED] ondansetron **OR** ondansetron (ZOFRAN) IV   I have personally reviewed following labs and imaging studies  LABORATORY DATA: CBC: Recent Labs  Lab 11/09/22 1420 11/10/22 1404 11/11/22 0412 11/11/22 1332 11/12/22 0548 11/13/22 0711  WBC 13.7* 16.5* 11.6*  --  8.7 11.0*  NEUTROABS 10.8* 13.4*  --   --  6.9  --   HGB 13.5 12.8* 12.5* 11.6* 15.0 11.0*  HCT 42.0 38.3* 36.6* 34.0* 47.0 33.3*  MCV 93.5 94.1 91.3  --  93.8 93.3  PLT 346 342 327  --  251 283    Basic Metabolic Panel: Recent Labs  Lab 11/09/22 1530 11/10/22 1404 11/11/22 0412 11/11/22 1332  11/11/22 1649 11/12/22 0548 11/12/22 1650 11/13/22 0537 11/13/22 0711  NA 134* 138 133* 136  --  139  --   --  137  K 4.1 3.8 3.5 3.5  --  3.6  --   --  3.8  CL 98 96* 98  --   --  100  --   --  98  CO2 27 27 25   --   --  32  --   --  32  GLUCOSE 84 72 95  --   --  105*  --   --  141*  BUN 17 18 16   --   --  18  --   --  22  CREATININE 0.85 1.22 1.01  --   --  1.28*  --   --  0.95  CALCIUM 8.4* 8.9 8.5*  --   --  8.5*  --   --  8.1*  MG  --   --   --   --  2.1 2.2 2.2 1.9  --   PHOS  --   --   --   --  3.9 3.9 3.5 2.5  --     GFR: Estimated Creatinine Clearance: 84.1 mL/min (by C-G formula based on SCr of 0.95 mg/dL).  Liver Function Tests: Recent Labs  Lab 11/09/22 1530 11/10/22 1404 11/11/22 0412 11/13/22 0711  AST 37 36 29 21  ALT 21 23 22 18   ALKPHOS 48 56 48 41   BILITOT 1.9* 1.6* 1.0 0.5  PROT 6.2* 6.0* 5.2* 4.8*  ALBUMIN 2.7* 2.4* 1.9* 1.6*   No results for input(s): "LIPASE", "AMYLASE" in the last 168 hours. No results for input(s): "AMMONIA" in the last 168 hours.  Coagulation Profile: Recent Labs  Lab 11/10/22 2018  INR 1.2    Cardiac Enzymes: Recent Labs  Lab 11/10/22 2018  CKTOTAL 163    BNP (last 3 results) No results for input(s): "PROBNP" in the last 8760 hours.  Lipid Profile: No results for input(s): "CHOL", "HDL", "LDLCALC", "TRIG", "CHOLHDL", "LDLDIRECT" in the last 72 hours.  Thyroid Function Tests: No results for input(s): "TSH", "T4TOTAL", "FREET4", "T3FREE", "THYROIDAB" in the last 72 hours.  Anemia Panel: No results for input(s): "VITAMINB12", "FOLATE", "FERRITIN", "TIBC", "IRON", "RETICCTPCT" in the last 72 hours.  Urine analysis:    Component Value Date/Time   COLORURINE AMBER (A) 11/09/2022 1510   APPEARANCEUR CLEAR 11/09/2022 1510   LABSPEC 1.026 11/09/2022 1510   PHURINE 5.0 11/09/2022 1510   GLUCOSEU NEGATIVE 11/09/2022 1510   HGBUR NEGATIVE 11/09/2022 1510   BILIRUBINUR NEGATIVE 11/09/2022 1510   KETONESUR NEGATIVE 11/09/2022 1510   PROTEINUR 30 (A) 11/09/2022 1510   NITRITE NEGATIVE 11/09/2022 1510   LEUKOCYTESUR NEGATIVE 11/09/2022 1510    Sepsis Labs: Lactic Acid, Venous    Component Value Date/Time   LATICACIDVEN 1.4 11/10/2022 1404    MICROBIOLOGY: Recent Results (from the past 240 hour(s))  Blood culture (routine x 2)     Status: None (Preliminary result)   Collection Time: 11/10/22 11:55 AM   Specimen: BLOOD  Result Value Ref Range Status   Specimen Description BLOOD BLOOD LEFT FOREARM  Final   Special Requests   Final    BOTTLES DRAWN AEROBIC AND ANAEROBIC Blood Culture adequate volume   Culture   Final    NO GROWTH 3 DAYS Performed at [facility], [address] [place], [place] [address]    Report Status PENDING  Incomplete  Blood culture (routine x 2)  Status: None (Preliminary result)   Collection Time: 11/10/22  2:04 PM   Specimen: BLOOD  Result Value Ref Range Status   Specimen Description BLOOD LEFT ANTECUBITAL  Final   Special Requests   Final    BOTTLES DRAWN AEROBIC AND ANAEROBIC Blood Culture adequate volume   Culture   Final    NO GROWTH 3 DAYS Performed at [facility], [address] [place], [place] [address]    Report Status PENDING  Incomplete  MRSA Next Gen by PCR, Nasal     Status: None   Collection Time: 11/11/22  9:16 AM   Specimen: Nasal Mucosa; Nasal Swab  Result Value Ref Range Status   MRSA by PCR Next Gen NOT DETECTED NOT DETECTED Final    Comment: (NOTE) The GeneXpert MRSA Assay (FDA approved for NASAL specimens only), is one component of a comprehensive MRSA colonization surveillance program. It is not intended to diagnose MRSA infection nor to guide or monitor treatment for MRSA infections. Test performance is not FDA approved in patients less than 13 years old. Performed at [facility], [address] [place], [place] [address]     RADIOLOGY STUDIES/RESULTS: VAS [place] LOWER EXTREMITY VENOUS (DVT)  Result Date: 11/11/2022  Lower Venous DVT Study Patient Name:  [name]  Date of Exam:   11/11/2022 Medical Rec #: [id number]        Accession #:    [id number] Date of Birth: [birth year]        Patient Gender: M Patient Age:   69 years Exam Location:  [facility] Procedure:      VAS [place] LOWER EXTREMITY VENOUS (DVT) Referring Phys: Mikey College --------------------------------------------------------------------------------  Indications: Bilateral lower extremity edema.  Risk Factors: Heart failure. Limitations: Patient positioning- right lateral decubitus in setting of pressure ulcers. Comparison Study: No prior studies. Performing Technologist: Jean Rosenthal RDMS, RVT  Examination Guidelines: A complete evaluation includes B-mode imaging, spectral Doppler, color Doppler, and power Doppler as  needed of all accessible portions of each vessel. Bilateral testing is considered an integral part of a complete examination. Limited examinations for reoccurring indications may be performed as noted. The reflux portion of the exam is performed with the patient in reverse Trendelenburg.  +---------+---------------+---------+-----------+----------+--------------+ RIGHT    CompressibilityPhasicitySpontaneityPropertiesThrombus Aging +---------+---------------+---------+-----------+----------+--------------+ CFV      Full           Yes      Yes                                 +---------+---------------+---------+-----------+----------+--------------+ SFJ      Full                                                        +---------+---------------+---------+-----------+----------+--------------+ FV Prox  Full                                                        +---------+---------------+---------+-----------+----------+--------------+ FV Mid   Full                                                        +---------+---------------+---------+-----------+----------+--------------+  FV DistalFull                                                        +---------+---------------+---------+-----------+----------+--------------+ PFV      Full                                                        +---------+---------------+---------+-----------+----------+--------------+ POP      Full           Yes      Yes                                 +---------+---------------+---------+-----------+----------+--------------+ PTV      Full                                                        +---------+---------------+---------+-----------+----------+--------------+ PERO     Full                                                        +---------+---------------+---------+-----------+----------+--------------+ Gastroc  Full                                                         +---------+---------------+---------+-----------+----------+--------------+   +--------+---------------+---------+-----------+----------------+-------------+ LEFT    CompressibilityPhasicitySpontaneityProperties      Thrombus                                                                 Aging         +--------+---------------+---------+-----------+----------------+-------------+ CFV     Full           Yes      Yes                                      +--------+---------------+---------+-----------+----------------+-------------+ SFJ     Full                                                             +--------+---------------+---------+-----------+----------------+-------------+ FV Prox Full                                                             +--------+---------------+---------+-----------+----------------+-------------+  FV Mid  Full                               Posterior                                                                approach                      +--------+---------------+---------+-----------+----------------+-------------+ FV      Full                               Posterior                     Distal                                     approach                      +--------+---------------+---------+-----------+----------------+-------------+ PFV     Full                                                             +--------+---------------+---------+-----------+----------------+-------------+ POP     Full           Yes      Yes                                      +--------+---------------+---------+-----------+----------------+-------------+ PTV     Full                                                             +--------+---------------+---------+-----------+----------------+-------------+ PERO    Full                                                              +--------+---------------+---------+-----------+----------------+-------------+     Summary: RIGHT: - There is no evidence of deep vein thrombosis in the lower extremity.  - No cystic structure found in the popliteal fossa.  LEFT: - There is no evidence of deep vein thrombosis in the lower extremity. However, portions of this examination were limited- see technologist comments above.  - No cystic structure found in the popliteal fossa.  *See table(s) above for measurements and observations. Electronically signed by Coral Else MD on 11/11/2022 at 6:35:51 PM.    Final    DG Abd 1 View  Result Date: 11/11/2022  CLINICAL DATA:  OG tube placement EXAM: ABDOMEN - 1 VIEW COMPARISON:  None Available. FINDINGS: See same day chest radiograph for thoracic findings. Weighted enteric tube courses below diaphragm and likely terminates in the proximal duodenum (D1). Nonobstructive bowel gas pattern. No acute osseous abnormality. IMPRESSION: Weighted enteric tube likely terminates in the proximal duodenum (D1). Electronically Signed   By: Lorenza Cambridge M.D.   On: 11/11/2022 15:58     LOS: 3 days   Jeoffrey Massed, MD  [facility]    To contact the attending provider between 7A-7P or the covering provider during after hours 7P-7A, please log into the web site [email or web address] and access using universal [place] password for that web site. If you do not have the password, please call the hospital operator.  11/13/2022, 9:49 AM

## 2022-11-13 NOTE — Progress Notes (Signed)
eLink Physician-Brief Progress Note Patient Name: [name] DOB: 10/11/1950 MRN: [id number]   Date of Service  11/13/2022  HPI/Events of Note  Patient having frequent, copious watery stools.  eICU Interventions  Flexiseal ordered.        Thomasene Lot Azizi Bally 11/13/2022, 4:14 AM

## 2022-11-14 DIAGNOSIS — W19XXXA Unspecified fall, initial encounter: Secondary | ICD-10-CM | POA: Diagnosis not present

## 2022-11-14 DIAGNOSIS — Z789 Other specified health status: Secondary | ICD-10-CM

## 2022-11-14 DIAGNOSIS — L98419 Non-pressure chronic ulcer of buttock with unspecified severity: Secondary | ICD-10-CM | POA: Diagnosis not present

## 2022-11-14 DIAGNOSIS — A419 Sepsis, unspecified organism: Secondary | ICD-10-CM | POA: Diagnosis not present

## 2022-11-14 LAB — CBC
HCT: 33.9 % — ABNORMAL LOW (ref 39.0–52.0)
Hemoglobin: 11.4 g/dL — ABNORMAL LOW (ref 13.0–17.0)
MCH: 31.1 pg (ref 26.0–34.0)
MCHC: 33.6 g/dL (ref 30.0–36.0)
MCV: 92.6 fL (ref 80.0–100.0)
Platelets: 314 10*3/uL (ref 150–400)
RBC: 3.66 MIL/uL — ABNORMAL LOW (ref 4.22–5.81)
RDW: 13.8 % (ref 11.5–15.5)
WBC: 11.3 10*3/uL — ABNORMAL HIGH (ref 4.0–10.5)
nRBC: 0 % (ref 0.0–0.2)

## 2022-11-14 LAB — BASIC METABOLIC PANEL
Anion gap: 10 (ref 5–15)
BUN: 26 mg/dL — ABNORMAL HIGH (ref 8–23)
CO2: 31 mmol/L (ref 22–32)
Calcium: 8.6 mg/dL — ABNORMAL LOW (ref 8.9–10.3)
Chloride: 95 mmol/L — ABNORMAL LOW (ref 98–111)
Creatinine, Ser: 0.85 mg/dL (ref 0.61–1.24)
GFR, Estimated: >60 mL/min (ref >60)
Glucose, Bld: 140 mg/dL — ABNORMAL HIGH (ref 70–99)
Potassium: 4.3 mmol/L (ref 3.5–5.1)
Sodium: 136 mmol/L (ref 135–145)

## 2022-11-14 LAB — GLUCOSE, CAPILLARY
Glucose-Capillary: 140 mg/dL — ABNORMAL HIGH (ref 70–99)
Glucose-Capillary: 99 mg/dL (ref 70–99)
Glucose-Capillary: 147 mg/dL — ABNORMAL HIGH (ref 70–99)
Glucose-Capillary: 132 mg/dL — ABNORMAL HIGH (ref 70–99)
Glucose-Capillary: 140 mg/dL — ABNORMAL HIGH (ref 70–99)
Glucose-Capillary: 162 mg/dL — ABNORMAL HIGH (ref 70–99)

## 2022-11-14 LAB — CULTURE, BLOOD (ROUTINE X 2)

## 2022-11-14 LAB — MAGNESIUM: Magnesium: 2.1 mg/dL (ref 1.7–2.4)

## 2022-11-14 MED ORDER — PRAZOSIN HCL 1 MG PO CAPS
1.0000 mg | ORAL_CAPSULE | Freq: Every day | ORAL | Status: DC
  Administered 2022-11-14 – 2022-11-18 (×5): 1 mg via ORAL
  Filled 2022-11-14 (×6): qty 1

## 2022-11-14 MED ORDER — FUROSEMIDE 20 MG PO TABS
20.0000 mg | ORAL_TABLET | Freq: Every day | ORAL | Status: DC
  Administered 2022-11-14 – 2022-11-22 (×9): 20 mg
  Filled 2022-11-14 (×9): qty 1
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
## 2022-11-15 DIAGNOSIS — L0390 Cellulitis, unspecified: Secondary | ICD-10-CM

## 2022-11-15 DIAGNOSIS — Z789 Other specified health status: Secondary | ICD-10-CM | POA: Diagnosis not present

## 2022-11-15 DIAGNOSIS — A419 Sepsis, unspecified organism: Secondary | ICD-10-CM | POA: Diagnosis not present

## 2022-11-15 DIAGNOSIS — L98419 Non-pressure chronic ulcer of buttock with unspecified severity: Secondary | ICD-10-CM | POA: Diagnosis not present

## 2022-11-15 LAB — GLUCOSE, CAPILLARY
Glucose-Capillary: 115 mg/dL — ABNORMAL HIGH (ref 70–99)
Glucose-Capillary: 128 mg/dL — ABNORMAL HIGH (ref 70–99)
Glucose-Capillary: 141 mg/dL — ABNORMAL HIGH (ref 70–99)

## 2022-11-15 LAB — MAGNESIUM: Magnesium: 2.2 mg/dL (ref 1.7–2.4)

## 2022-11-15 LAB — CULTURE, BLOOD (ROUTINE X 2)
Culture: NO GROWTH
Special Requests: ADEQUATE
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
## 2022-11-15 NOTE — TOC Progression Note (Signed)
Transition of Care [facility]) - Progression Note    Patient Details  Name: [name] MRN: [id number] Date of Birth: 04-May-1951  Transition of Care [facility]) CM/SW Contact  Jimmy Picket, [place] Phone Number: 11/15/2022, 9:11 AM  Clinical Narrative:     Pt has no bed offers at this time.   Expected Discharge Plan: Skilled Nursing Facility Barriers to Discharge: Continued Medical Work up, SNF Pending bed offer  Expected Discharge Plan and Services In-house Referral: Clinical Social Work   Post Acute Care Choice: Skilled Nursing Facility Living arrangements for the past 2 months: Homeless Shelter                                       Social Determinants of Health (SDOH) Interventions SDOH Screenings   Food Insecurity: Food Insecurity Present (11/11/2022)  Housing: High Risk (11/11/2022)  Transportation Needs: Unmet Transportation Needs (11/11/2022)  Utilities: At Risk (11/11/2022)  Alcohol Screen: Low Risk  (03/20/2022)  Depression (PHQ2-9): Low Risk  (08/27/2022)  Recent Concern: Depression (PHQ2-9) - Medium Risk (06/01/2022)  Financial Resource Strain: Medium Risk (03/18/2022)  Physical Activity: Sufficiently Active (03/20/2022)  Social Connections: Socially Isolated (03/20/2022)  Stress: No Stress Concern Present (03/20/2022)  Tobacco Use: High Risk (11/10/2022)    Readmission Risk Interventions     No data to display

## 2022-11-16 DIAGNOSIS — Z789 Other specified health status: Secondary | ICD-10-CM | POA: Diagnosis not present

## 2022-11-16 DIAGNOSIS — W19XXXA Unspecified fall, initial encounter: Secondary | ICD-10-CM | POA: Diagnosis not present

## 2022-11-16 DIAGNOSIS — L98419 Non-pressure chronic ulcer of buttock with unspecified severity: Secondary | ICD-10-CM | POA: Diagnosis not present

## 2022-11-16 DIAGNOSIS — A419 Sepsis, unspecified organism: Secondary | ICD-10-CM | POA: Diagnosis not present

## 2022-11-16 LAB — GLUCOSE, CAPILLARY
Glucose-Capillary: 107 mg/dL — ABNORMAL HIGH (ref 70–99)
Glucose-Capillary: 110 mg/dL — ABNORMAL HIGH (ref 70–99)
Glucose-Capillary: 116 mg/dL — ABNORMAL HIGH (ref 70–99)
Glucose-Capillary: 129 mg/dL — ABNORMAL HIGH (ref 70–99)
Glucose-Capillary: 132 mg/dL — ABNORMAL HIGH (ref 70–99)

## 2022-11-16 MED ORDER — OSMOLITE 1.2 CAL PO LIQD: 1000.0000 mL | ORAL | Status: DC
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
## 2022-11-16 NOTE — Progress Notes (Signed)
Speech Language Pathology Treatment: Dysphagia  Patient Details Name: [name] MRN: [id number] DOB: 10-Mar-1951 Today's Date: 11/16/2022 Time: 0712-1975 SLP Time Calculation (min) (ACUTE ONLY): 15 min  Assessment / Plan / Recommendation Clinical Impression  Pt seen for swallowing therapy.  Pt awake, alert, pleasant, participative.  Complaining of dry mouth but oral mucosa is pink and wet.  Pt tolerated puree trials (deep, but transient penetration of puree on MBS 4/12), and there was cough on 1 of 10 trials of thin liquid by spoon during effortful swallow exercise.  Introduced swallowing exercises to pt he was unable to execute mendelssohn maneuver but completed all other exercises as noted below with excellent participation.  Discussed results of MBS with pt and plan for treatment and repeat assessment later this week.  Pt shares that his dad had trouble swallowing as well.  Recommend pt remain NPO with alternate means of nutrition, hydration, and medication. Pt may have ice chips in moderation, after good oral care, when fully awake/alert, with upright positioning and direct supervision.  Swallowing Exercises Effortful Swallow Reps: 10 Effort/Accuracy: presumed good, unable to assess Comments: Given with small amounts of water by spoon  CTAR Reps: 10 Effort/Accuracy: Excellent  [name] Reps: 10, with breaks Effort: Excellent Accuracy: Good Comments: Fatigued across repetitions  Mendelssohn Reps: attempt x3, pt unable to execute    HPI HPI: [name] is a 72 y.o. male who presented with generalized weakness and frequent falls.    Patient reported that for the last 2 to 3 days has been feeling generalized weakness, with frequent falls and as a result he has been very much bedbound for the last 2 days and not been eating or drinking much, further made him very weak.  He came to the ED for the first time last night for multiple complaints including cough bilateral shoulder  pain bilateral leg weakness, and wound on his buttocks.  TF placed d/t hypoglycemia/AMS; BSE generated to assess swallow function. Pt with medical history significant of alcohol abuse, HTN and chronic HFpEF, schizophrenia, anxiety/depression, homeless, TBI 1975 with residual dysarthria      SLP Plan  Continue with current plan of care      Recommendations for follow up therapy are one component of a multi-disciplinary discharge planning process, led by the attending physician.  Recommendations may be updated based on patient status, additional functional criteria and insurance authorization.    Recommendations  Diet recommendations: NPO Medication Administration: Via alternative means                  Oral care QID;Oral care prior to ice chip/H20;Staff/trained caregiver to provide oral care     Dysphagia, oropharyngeal phase (R13.12)     Continue with current plan of care     Kerrie Pleasure, MA, CCC-SLP Acute Rehabilitation Services Office: [phone] 11/16/2022, 10:19 AM

## 2022-11-17 ENCOUNTER — Encounter: Payer: Medicare Other | Admitting: Nurse Practitioner

## 2022-11-17 DIAGNOSIS — W19XXXA Unspecified fall, initial encounter: Secondary | ICD-10-CM | POA: Diagnosis not present

## 2022-11-17 DIAGNOSIS — A419 Sepsis, unspecified organism: Secondary | ICD-10-CM | POA: Diagnosis not present

## 2022-11-17 DIAGNOSIS — Z789 Other specified health status: Secondary | ICD-10-CM | POA: Diagnosis not present

## 2022-11-17 DIAGNOSIS — L98419 Non-pressure chronic ulcer of buttock with unspecified severity: Secondary | ICD-10-CM | POA: Diagnosis not present

## 2022-11-17 LAB — BASIC METABOLIC PANEL
Anion gap: 5 (ref 5–15)
BUN: 26 mg/dL — ABNORMAL HIGH (ref 8–23)
CO2: 30 mmol/L (ref 22–32)
Calcium: 8 mg/dL — ABNORMAL LOW (ref 8.9–10.3)
Chloride: 100 mmol/L (ref 98–111)
Creatinine, Ser: 0.87 mg/dL (ref 0.61–1.24)
GFR, Estimated: >60 mL/min (ref >60)
Glucose, Bld: 188 mg/dL — ABNORMAL HIGH (ref 70–99)
Potassium: 3.7 mmol/L (ref 3.5–5.1)
Sodium: 135 mmol/L (ref 135–145)

## 2022-11-17 LAB — CBC
HCT: 34.9 % — ABNORMAL LOW (ref 39.0–52.0)
Hemoglobin: 11.5 g/dL — ABNORMAL LOW (ref 13.0–17.0)
MCH: 30.4 pg (ref 26.0–34.0)
MCHC: 33 g/dL (ref 30.0–36.0)
MCV: 92.3 fL (ref 80.0–100.0)
Platelets: 322 K/uL (ref 150–400)
RBC: 3.78 MIL/uL — ABNORMAL LOW (ref 4.22–5.81)
RDW: 13.8 % (ref 11.5–15.5)
WBC: 12.1 K/uL — ABNORMAL HIGH (ref 4.0–10.5)
nRBC: 0 % (ref 0.0–0.2)

## 2022-11-17 LAB — GLUCOSE, CAPILLARY
Glucose-Capillary: 194 mg/dL — ABNORMAL HIGH (ref 70–99)
Glucose-Capillary: 153 mg/dL — ABNORMAL HIGH (ref 70–99)
Glucose-Capillary: 151 mg/dL — ABNORMAL HIGH (ref 70–99)
Glucose-Capillary: 147 mg/dL — ABNORMAL HIGH (ref 70–99)
Glucose-Capillary: 166 mg/dL — ABNORMAL HIGH (ref 70–99)

## 2022-11-17 NOTE — TOC Progression Note (Signed)
Transition of Care [facility]) - Progression Note    Patient Details  Name: [name] MRN: [id number] Date of Birth: 15-May-1951  [facility] CM/SW Contact  [name], LCSW Phone Number: 11/17/2022, 12:09 PM  Clinical Narrative:    Pasrr has gone to Level II in person review. Will continue to follow for SNF referrals.    Expected Discharge Plan: Skilled Nursing Facility Barriers to Discharge: Continued Medical Work up, SNF Pending bed offer  Expected Discharge Plan and Services In-house Referral: Clinical Social Work   Post Acute Care Choice: Skilled Nursing Facility Living arrangements for the past 2 months: Homeless Shelter                                       Social Determinants of Health (SDOH) Interventions SDOH Screenings   Food Insecurity: Food Insecurity Present (11/11/2022)  Housing: High Risk (11/11/2022)  Transportation Needs: Unmet Transportation Needs (11/11/2022)  Utilities: At Risk (11/11/2022)  Alcohol Screen: Low Risk  (03/20/2022)  Depression (PHQ2-9): Low Risk  (08/27/2022)  Recent Concern: Depression (PHQ2-9) - Medium Risk (06/01/2022)  Financial Resource Strain: Medium Risk (03/18/2022)  Physical Activity: Sufficiently Active (03/20/2022)  Social Connections: Socially Isolated (03/20/2022)  Stress: No Stress Concern Present (03/20/2022)  Tobacco Use: High Risk (11/10/2022)    Readmission Risk Interventions     No data to display

## 2022-11-17 NOTE — Progress Notes (Signed)
OT Cancellation Note  Patient Details Name: [name] MRN: [id number] DOB: [birth year]   Cancelled Treatment:    Reason Eval/Treat Not Completed: Patient at procedure or test/ unavailable. Pt attempted x2 first getting hydrotherapy, then speaking with other hospital staff. OT will continue to follow acutely.   Evern Bio [name] 11/17/2022, 12:44 PM  [name] OTR/L Acute Rehabilitation Services Office: [phone]
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
## 2022-11-17 NOTE — Progress Notes (Signed)
Physical Therapy Wound Treatment Patient Details  Name: [name] MRN: [id number] Date of Birth: May 04, 1951  Today's Date: 11/17/2022 Time: 4098-1191 Time Calculation (min): 50 min  Subjective  Subjective Assessment Patient and Family Stated Goals: None stated Date of Onset:  (Unknown) Prior Treatments: Dressing changes  Pain Score:   Some discomfort with removal of dressing and debridement of left buttock wound. Pt unable to quantify  Wound Assessment     Wound / Incision (Open or Dehisced) 11/10/22 Other (Comment) Buttocks Left Pressure wound (Active)  Wound Image   11/17/22 1119  Dressing Type Gauze (Comment);Foam - Lift dressing to assess site every shift;Barrier Film (skin prep) 11/17/22 1119  Dressing Changed Changed 11/17/22 1119  Dressing Status Clean, Dry, Intact 11/17/22 1119  Dressing Change Frequency Twice a day 11/17/22 1119  Site / Wound Assessment Yellow;Black;Brown;Red 11/17/22 1119  % Wound base Red or Granulating 10% 11/17/22 1119  % Wound base Yellow/Fibrinous Exudate 10% 11/17/22 1119  % Wound base Black/Eschar 80% 11/17/22 1119  % Wound base Other/Granulation Tissue (Comment) 0% 11/13/22 1607  Peri-wound Assessment Erythema (blanchable) 11/17/22 1119  Wound Length (cm) 9.8 cm 11/17/22 1119  Wound Width (cm) 8 cm 11/17/22 1119  Wound Depth (cm) 0.1 cm 11/17/22 1119  Wound Volume (cm^3) 7.84 cm^3 11/17/22 1119  Wound Surface Area (cm^2) 78.4 cm^2 11/17/22 1119  Tunneling (cm) 0 11/11/22 1500  Undermining (cm) 0 11/11/22 1500  Margins Unattached edges (unapproximated) 11/17/22 1119  Closure None 11/17/22 1119  Drainage Amount Minimal 11/17/22 1119  Drainage Description Serous 11/17/22 1119  Treatment Debridement (Selective);Irrigation 11/17/22 1119     Wound / Incision (Open or Dehisced) 11/10/22 Other (Comment) Buttocks Right Pressure Wound (Active)  Wound Image  11/11/22 1500  Dressing Type Gauze (Comment);Foam - Lift dressing to assess site every  shift;Barrier Film (skin prep) 11/17/22 1119  Dressing Changed Changed 11/17/22 1119  Dressing Status Clean, Dry, Intact 11/17/22 1119  Dressing Change Frequency Daily 11/17/22 1119  Site / Wound Assessment Yellow;Red 11/17/22 1119  % Wound base Red or Granulating 85% 11/17/22 1119  % Wound base Yellow/Fibrinous Exudate 15% 11/17/22 1119  % Wound base Black/Eschar 0% 11/17/22 1119  Peri-wound Assessment Erythema (blanchable) 11/17/22 1119  Wound Length (cm) 6.6 cm 11/17/22 1119  Wound Width (cm) 6 cm 11/17/22 1119  Wound Depth (cm) 0.1 cm 11/17/22 1119  Wound Volume (cm^3) 3.96 cm^3 11/17/22 1119  Wound Surface Area (cm^2) 39.6 cm^2 11/17/22 1119  Tunneling (cm) 0 11/11/22 1500  Undermining (cm) 0 11/11/22 1500  Margins Unattached edges (unapproximated) 11/17/22 1119  Closure None 11/17/22 1119  Drainage Amount Minimal 11/17/22 1119  Drainage Description Serosanguineous 11/17/22 1119  Treatment Debridement (Selective);Irrigation 11/17/22 1119   Selective Debridement (non-excisional) Selective Debridement (non-excisional) - Location: Bilateral buttocks Selective Debridement (non-excisional) - Tools Used: Forceps, Scalpel Selective Debridement (non-excisional) - Tissue Removed: Eschar and slough    Wound Assessment and Plan  Wound Therapy - Assess/Plan/Recommendations Wound Therapy - Clinical Statement: Lt buttock wound with significant progress with removal of most of the black eschar.  This patient will benefit from continued wound therapy for selective removal of unviable tissue, to decrease bioburden, and promote wound bed healing. Wound Therapy - Functional Problem List: Decreased tolerance for OOB and positioning due to wounds Factors Delaying/Impairing Wound Healing: Infection - systemic/local, Immobility Hydrotherapy Plan: Debridement, Dressing change, Patient/family education Wound Therapy - Frequency: 2X / week Wound Therapy - Follow Up Recommendations: dressing changes by  RN  Wound Therapy Goals- Improve the  function of patient's integumentary system by progressing the wound(s) through the phases of wound healing (inflammation - proliferation - remodeling) by: Wound Therapy Goals - Improve the function of patient's integumentary system by progressing the wound(s) through the phases of wound healing by: Decrease Necrotic Tissue to: 20% Decrease Necrotic Tissue - Progress: Progressing toward goal Increase Granulation Tissue to: 80% Increase Granulation Tissue - Progress: Progressing toward goal Goals/treatment plan/discharge plan were made with and agreed upon by patient/family: No, Patient unable to participate in goals/treatment/discharge plan and family unavailable Time For Goal Achievement: 7 days Wound Therapy - Potential for Goals: Good  Goals will be updated until maximal potential achieved or discharge criteria met.  Discharge criteria: when goals achieved, discharge from hospital, MD decision/surgical intervention, no progress towards goals, refusal/missing three consecutive treatments without notification or medical reason.  GP     Charges PT Wound Care Charges $Wound Debridement up to 20 cm: < or equal to 20 cm $ Wound Debridement each add'l 20 sqcm: 5 $PT Hydrotherapy Visit: 2 Visits      Jerolyn Center, PT Acute Rehabilitation Services  Office [phone]   Zena Amos 11/17/2022, 11:41 AM

## 2022-11-18 DIAGNOSIS — L0390 Cellulitis, unspecified: Secondary | ICD-10-CM | POA: Diagnosis not present

## 2022-11-18 DIAGNOSIS — Z789 Other specified health status: Secondary | ICD-10-CM | POA: Diagnosis not present

## 2022-11-18 DIAGNOSIS — L98419 Non-pressure chronic ulcer of buttock with unspecified severity: Secondary | ICD-10-CM | POA: Diagnosis not present

## 2022-11-18 DIAGNOSIS — A419 Sepsis, unspecified organism: Secondary | ICD-10-CM | POA: Diagnosis not present

## 2022-11-18 LAB — GLUCOSE, CAPILLARY
Glucose-Capillary: 124 mg/dL — ABNORMAL HIGH (ref 70–99)
Glucose-Capillary: 136 mg/dL — ABNORMAL HIGH (ref 70–99)
Glucose-Capillary: 139 mg/dL — ABNORMAL HIGH (ref 70–99)
Glucose-Capillary: 153 mg/dL — ABNORMAL HIGH (ref 70–99)
Glucose-Capillary: 168 mg/dL — ABNORMAL HIGH (ref 70–99)
Glucose-Capillary: 175 mg/dL — ABNORMAL HIGH (ref 70–99)

## 2022-11-18 MED ORDER — OLANZAPINE 5 MG PO TABS
20.0000 mg | ORAL_TABLET | Freq: Every day | ORAL | Status: DC
  Administered 2022-11-19 – 2022-11-22 (×4): 20 mg
  Filled 2022-11-18 (×4): qty 4

## 2022-11-18 MED ORDER — THIAMINE MONONITRATE 100 MG PO TABS
100.0000 mg | ORAL_TABLET | Freq: Every day | ORAL | Status: DC
  Administered 2022-11-19 – 2022-11-22 (×4): 100 mg
  Filled 2022-11-18 (×4): qty 1

## 2022-11-18 MED ORDER — ROSUVASTATIN CALCIUM 20 MG PO TABS
40.0000 mg | ORAL_TABLET | Freq: Every day | ORAL | Status: DC
  Administered 2022-11-19 – 2022-11-22 (×4): 40 mg
  Filled 2022-11-18 (×4): qty 2

## 2022-11-18 MED ORDER — ADULT MULTIVITAMIN W/MINERALS CH
1.0000 | ORAL_TABLET | Freq: Every day | ORAL | Status: DC
  Administered 2022-11-19 – 2022-11-22 (×4): 1
  Filled 2022-11-18 (×4): qty 1

## 2022-11-18 MED ORDER — PRAZOSIN HCL 1 MG PO CAPS
1.0000 mg | ORAL_CAPSULE | Freq: Every day | ORAL | Status: DC
  Administered 2022-11-19 – 2022-11-22 (×4): 1 mg
  Filled 2022-11-18 (×4): qty 1

## 2022-11-18 MED ORDER — BUSPIRONE HCL 5 MG PO TABS
15.0000 mg | ORAL_TABLET | Freq: Three times a day (TID) | ORAL | Status: DC
  Administered 2022-11-19 – 2022-11-22 (×12): 15 mg
  Filled 2022-11-18 (×12): qty 3

## 2022-11-18 MED ORDER — EZETIMIBE 10 MG PO TABS
10.0000 mg | ORAL_TABLET | Freq: Every day | ORAL | Status: DC
  Administered 2022-11-19 – 2022-11-22 (×4): 10 mg
  Filled 2022-11-18 (×4): qty 1

## 2022-11-18 MED ORDER — MIDODRINE HCL 5 MG PO TABS
10.0000 mg | ORAL_TABLET | Freq: Three times a day (TID) | ORAL | Status: DC
  Administered 2022-11-19 – 2022-11-22 (×12): 10 mg
  Filled 2022-11-18 (×12): qty 2
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
## 2022-11-18 NOTE — Plan of Care (Signed)
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
## 2022-11-18 NOTE — Progress Notes (Signed)
Mobility Specialist - Progress Note   11/18/22 1139  Mobility  Activity Transferred from chair to bed  Level of Assistance +2 (takes two people)  Assistive Device Front wheel walker  Activity Response Tolerated well  Mobility Referral Yes  $Mobility charge 1 Mobility   Pt was received in chair and needing assistance back to bed. Pt was ModA +2 to stand from chair and pivot to bed. No complaints throughout. Pt was returned to bed with all needs met. RN present.   Alda Lea  Mobility Specialist Please contact via Special educational needs teacher or Rehab office at [phone]

## 2022-11-18 NOTE — Progress Notes (Signed)
Speech Language Pathology Treatment: Dysphagia  Patient Details Name: [name] MRN: [id number] DOB: 21-Apr-1951 Today's Date: 11/18/2022 Time: 0950-1003 SLP Time Calculation (min) (ACUTE ONLY): 13 min  Assessment / Plan / Recommendation Clinical Impression  Pt seen for ongoing dysphagia management.  SLP set up suction in room and provided oral care prior to PO trials. Pt tolerated ice chips with no clinical s/s of aspiration. There was slightly delayed cough with spoon of thin liquid.  Pt denied feeling of water going down the wrong way which had endorsed during session earlier this week.  Cough was subjectively less wet sounding as well.  Pt states he has not been doing swallowing exercises independently.  Today he completed 5 reps of effortful swallow with ice chip before refusing exercises because they are tiring him out.  Pt's encephalopathy has resolved and he is improving medically.  Recommend repeat instrumental assessment to determine if there has been interval improvement in swallow function.  MBS planned for next date.  Recommend pt remain NPO with alternate means of nutrition, hydration, and medication. Pt may have ice chips in moderation, after good oral care, when fully awake/alert, with upright positioning and direct supervision.     HPI HPI: [name] is a 72 y.o. male who presented with generalized weakness and frequent falls.    Patient reported that for the last 2 to 3 days has been feeling generalized weakness, with frequent falls and as a result he has been very much bedbound for the last 2 days and not been eating or drinking much, further made him very weak.  He came to the ED for the first time last night for multiple complaints including cough bilateral shoulder pain bilateral leg weakness, and wound on his buttocks.  TF placed d/t hypoglycemia/AMS; BSE generated to assess swallow function. Pt with medical history significant of alcohol abuse, HTN and chronic HFpEF,  schizophrenia, anxiety/depression, homeless, TBI 1975 with residual dysarthria      SLP Plan  MBS      Recommendations for follow up therapy are one component of a multi-disciplinary discharge planning process, led by the attending physician.  Recommendations may be updated based on patient status, additional functional criteria and insurance authorization.    Recommendations  Diet recommendations: NPO Medication Administration: Via alternative means                  Oral care QID;Oral care prior to ice chip/H20;Staff/trained caregiver to provide oral care   Intermittent Supervision/Assistance Dysphagia, oropharyngeal phase (R13.12)     MBS     Kerrie Pleasure, MA, CCC-SLP Acute Rehabilitation Services Office: [phone] 11/18/2022, 10:10 AM
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
## 2022-11-19 ENCOUNTER — Inpatient Hospital Stay (HOSPITAL_COMMUNITY): Payer: Medicare Other

## 2022-11-19 DIAGNOSIS — A419 Sepsis, unspecified organism: Secondary | ICD-10-CM | POA: Diagnosis not present

## 2022-11-19 DIAGNOSIS — L0390 Cellulitis, unspecified: Secondary | ICD-10-CM | POA: Diagnosis not present

## 2022-11-19 DIAGNOSIS — L98419 Non-pressure chronic ulcer of buttock with unspecified severity: Secondary | ICD-10-CM | POA: Diagnosis not present

## 2022-11-19 DIAGNOSIS — Z789 Other specified health status: Secondary | ICD-10-CM | POA: Diagnosis not present

## 2022-11-19 LAB — GLUCOSE, CAPILLARY
Glucose-Capillary: 115 mg/dL — ABNORMAL HIGH (ref 70–99)
Glucose-Capillary: 137 mg/dL — ABNORMAL HIGH (ref 70–99)
Glucose-Capillary: 170 mg/dL — ABNORMAL HIGH (ref 70–99)
Glucose-Capillary: 183 mg/dL — ABNORMAL HIGH (ref 70–99)
Glucose-Capillary: 260 mg/dL — ABNORMAL HIGH (ref 70–99)
Glucose-Capillary: 288 mg/dL — ABNORMAL HIGH (ref 70–99)

## 2022-11-19 MED ORDER — FINASTERIDE 5 MG PO TABS
5.0000 mg | ORAL_TABLET | Freq: Every day | ORAL | Status: DC
  Administered 2022-11-19 – 2022-11-26 (×8): 5 mg via ORAL
  Filled 2022-11-19 (×8): qty 1
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
## 2022-11-19 NOTE — Plan of Care (Signed)

## 2022-11-19 NOTE — Procedures (Signed)
Modified Barium Swallow Study  Patient Details  Name: [name] MRN: [id number] Date of Birth: 1951-05-02  Today's Date: 11/19/2022  Modified Barium Swallow completed.  Full report located under Chart Review in the Imaging Section.  History of Present Illness [name] is a 72 y.o. male who presented with generalized weakness and frequent falls.    Patient reported that for the last 2 to 3 days has been feeling generalized weakness, with frequent falls and as a result he has been very much bedbound for the last 2 days and not been eating or drinking much, further made him very weak.  He came to the ED for the first time last night for multiple complaints including cough bilateral shoulder pain bilateral leg weakness, and wound on his buttocks.  TF placed d/t hypoglycemia/AMS; BSE generated to assess swallow function. Pt with medical history significant of alcohol abuse, HTN and chronic HFpEF, schizophrenia, anxiety/depression, homeless, TBI 1975 with residual dysarthria   Clinical Impression Pt presents with a moderate, but improving, oropharyngeal dysphagia c/b decreased base of tongue retraction, significantly reduced epiglottic inversion, reduced laryngeal elevation, decreased hyoid excursion, incomplete laryngeal closure, decreased pharyngeal stripping wave, reduced UES opening 2/2 presumed C-P bar, and diminished sensation.  These deficits resulted in silent aspiration of thin and nectar thick liquids during the swallow.  Pt exhibited reflexive cough x1 with aspiration of thin liquid.  Cued cough did not clear aspiration.  There was trace, transient penetration of honey thick liquids by cup and spoon.  Pt expericing discomfort while seated upright in chair, so additional compensatory strategies were not trialed with thinner consistencies.  Pt's impaired epiglottic inversion is a significant driving factor of his deficits.  It is possible that placement of cortrak is marginally affecting  epiglottic movement.  Epiglottis reaches horizontal but with no inversion.  It appears very curled and stiff in imaging.  Would recommend repeat assessment after removal of NG when appropriate to see if there is any increased deflection.  Given silent aspiration of liquids, pt would need repeat objective study to advance those consistencies. There was no penetration of puree and regular solids. With pill simulation there was stasis of tablet in vallecula and laryngeal vestibule.  A cued cough helped clear tablet from laryngeal vestibule.  Pt was eventually able to bring tablet up from the vallecula to expectorate.  Medications should be given crushed or via alternate means.  Pt was partially sensate to penetration of tablet and stated it was stuck in his throat.  He later asked if it had cleared.  Visual feedback provided with fluoroscopy screen in real time.  Esophageal sweep was not completed during today's assessment, but CP bar like project was still visible in cervical esophagus and pt with retention of contrast during prior MBS. Consider esophageal assessment.  Recommend regular texture diet with honey thick liquids by cup.    Recommend regular texture diet with Honey Thick/ Moderated Thick liquids.  Factors that may increase risk of adverse event in presence of aspiration Rubye Oaks & Clearance Coots 2021): Presence of tubes (ETT, trach, NG, etc.)  Swallow Evaluation Recommendations Recommendations: PO diet PO Diet Recommendation: Regular;Moderately thick liquids (Level 3, honey thick) Liquid Administration via: Cup;No straw Medication Administration: Crushed with puree (or via alternate means) Supervision: Staff to assist with self-feeding Swallowing strategies  : Slow rate;Small bites/sips Postural changes: Position pt fully upright for meals (as tolerated) Oral care recommendations: Oral care QID (4x/day);Oral care before ice chips/water Recommended consults: Consider esophageal assessment Caregiver  Recommendations: Avoid jello, ice cream, thin soups, popsicles;Have oral suction available      Kerrie Pleasure, MA, CCC-SLP Acute Rehabilitation Services Office: [phone] 11/19/2022,10:40 AM

## 2022-11-19 NOTE — Progress Notes (Signed)
Nutrition Follow-up  DOCUMENTATION CODES:   Not applicable  INTERVENTION:   Recommend continuing with enteral nutrition support at this time until pt can consistently show PO intake to meet >60% of estimated needs. Osmolite 1.5 at 65 ml/h (1560 ml per day) Prosource TF20 60 ml BID Provides 2500 kcal, 138 gm protein, 1189 ml free water daily Continue 1 packet Juven BID, each packet provides 95 calories, 2.5 grams of protein (collagen), and 9.8 grams of carbohydrate (3 grams sugar); also contains 7 grams of L-arginine and L-glutamine, 300 mg vitamin C, 15 mg vitamin E, 1.2 mcg vitamin B-12, 9.5 mg zinc, 200 mg calcium, and 1.5 g  Calcium Beta-hydroxy-Beta-methylbutyrate to support wound healing Continue Multivitamin w/ minerals daily Encourage good PO intake   NUTRITION DIAGNOSIS:   Increased nutrient needs related to wound healing as evidenced by estimated needs. - Ongoing  GOAL:   Patient will meet greater than or equal to 90% of their needs - Ongoing  MONITOR:   TF tolerance, Skin, I & O's, Diet advancement, Labs  REASON FOR ASSESSMENT:   Consult Enteral/tube feeding initiation and management  ASSESSMENT:   Pt with hx of HTN, CHF schizophrenia, homelessness, and EtOH abuse presented to ED with weakness and with a recent fall. Pt with sepsis on admission related to large wound to the buttocks.  4/10 - Cortrak placed 4/18 - MBS; diet advanced to regular/honey thick liquids  Pt being cleaned up by staff during RD visit. Pt shares that he is going to have chicken tenders for lunch. Explained monitoring PO intake to assess for adequate intake and removal of Cortrak; pt agreeable to plan. Tube feeds running at goal at time of assessment.   Discussed with MD.   Medications reviewed and include: Lasix, MVI, Thiamine, IV antibiotics  Labs reviewed.   Diet Order:   Diet Order             Diet regular Fluid consistency: Honey Thick  Diet effective now                    EDUCATION NEEDS:   Not appropriate for education at this time  Skin:  Skin Assessment: Reviewed RN Assessment Per WOC note on 4/09:  1. L buttock 9 cms x 7 cms 100% black leathery devitalized tissue  2.  R buttock 5 cms x 9 cms 100% black leathery devitalized tissue  3.  Bilateral knees partial thickness, likely traumatic; L knee 3 cms x 2 cms R knee 2 cms x 3 cms 100% pink and moist   Last BM:  4/17  Height:  Ht Readings from Last 1 Encounters:  11/10/22  (1.88 m)   Weight:  Wt Readings from Last 1 Encounters:  11/19/22 83.9 kg   Ideal Body Weight:  86.4 kg  BMI:  Body mass index is 23.75 kg/m.  Estimated Nutritional Needs:  Kcal:  2500-2800 kcal/d Protein:  125-150 g/d Fluid:  2.5L/d   Kirby Crigler RD, LDN Clinical Dietitian See [facility] for contact information.
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
## 2022-11-20 DIAGNOSIS — L98419 Non-pressure chronic ulcer of buttock with unspecified severity: Secondary | ICD-10-CM | POA: Diagnosis not present

## 2022-11-20 DIAGNOSIS — Z789 Other specified health status: Secondary | ICD-10-CM | POA: Diagnosis not present

## 2022-11-20 DIAGNOSIS — A419 Sepsis, unspecified organism: Secondary | ICD-10-CM | POA: Diagnosis not present

## 2022-11-20 DIAGNOSIS — L0390 Cellulitis, unspecified: Secondary | ICD-10-CM | POA: Diagnosis not present

## 2022-11-20 LAB — BASIC METABOLIC PANEL
Anion gap: 9 (ref 5–15)
BUN: 20 mg/dL (ref 8–23)
CO2: 27 mmol/L (ref 22–32)
Calcium: 8 mg/dL — ABNORMAL LOW (ref 8.9–10.3)
Chloride: 98 mmol/L (ref 98–111)
Creatinine, Ser: 0.79 mg/dL (ref 0.61–1.24)
GFR, Estimated: >60 mL/min (ref >60)
Glucose, Bld: 187 mg/dL — ABNORMAL HIGH (ref 70–99)
Potassium: 3.7 mmol/L (ref 3.5–5.1)
Sodium: 134 mmol/L — ABNORMAL LOW (ref 135–145)

## 2022-11-20 LAB — GLUCOSE, CAPILLARY
Glucose-Capillary: 201 mg/dL — ABNORMAL HIGH (ref 70–99)
Glucose-Capillary: 165 mg/dL — ABNORMAL HIGH (ref 70–99)
Glucose-Capillary: 105 mg/dL — ABNORMAL HIGH (ref 70–99)
Glucose-Capillary: 158 mg/dL — ABNORMAL HIGH (ref 70–99)
Glucose-Capillary: 199 mg/dL — ABNORMAL HIGH (ref 70–99)

## 2022-11-20 MED ORDER — ACETAMINOPHEN 325 MG PO TABS: 650.0000 mg | ORAL_TABLET | Freq: Four times a day (QID) | ORAL | Status: DC | PRN

## 2022-11-20 MED ORDER — SIMETHICONE 80 MG PO CHEW: 80.0000 mg | CHEWABLE_TABLET | Freq: Four times a day (QID) | ORAL | Status: DC | PRN

## 2022-11-20 NOTE — Progress Notes (Signed)
RT note. BIPAP QHS, refusing and not requiring at this time. RT will continue to monitor and place on patient if needed later.

## 2022-11-20 NOTE — Plan of Care (Signed)

## 2022-11-20 NOTE — Progress Notes (Signed)
Speech Language Pathology Treatment: Dysphagia  Patient Details Name: [name] MRN: [id number] DOB: 08/02/1951 Today's Date: 11/20/2022 Time: 1191-4782 SLP Time Calculation (min) (ACUTE ONLY): 25 min  Assessment / Plan / Recommendation Clinical Impression  Patient seen to assess tolerance of p.o. diet and to continue to implement swallowing exercises to maximize rehab potential. Patient this morning reports he has not been doing his exercises stating he just feels tired and he did not sleep well last night. He was able to demonstrate effortful swallow exercise to this therapist however. SLP reviewed modified barium swallow study with patient using fluoroscopy with. He recalled barium tablet lodging in vallecular space with ability to expectorate it and adequate sensation. He did not sense barium retention otherwise. SLP advised patient continue use to expectoration to strengthen his swallow. Observed patient consumed [place] toast, bacon, nectar thick cola, honey thick cola, honey thick orange juice. Prolonged but effective mastication noted and patient used liquids to moisten bolus to help with transit which was effective. He did demonstrate overt cough after swallowing nectar thick soda concerning for aspiration. Again patient endorses long-term history of dysphagia, question due to prior TBI. Intake of breakfast appeared good as he consumed all of his honey thick liquid *approx 12 ounces* one half of his omelette, 1/2 of his [place] toast, and nearly all of his bacon. When asked to strengthen his cough patient comments at times he will cough so strongly that he will feel like he cannot breathe - thus advised to cough strongly when needed to clear airway. Patient is making excellent progress with tolerance of p.o. diet.   HPI   [name] is a 72 y.o. male who presented with generalized weakness and frequent falls.    Patient reported that for the last 2 to 3 days has been feeling generalized  weakness, with frequent falls and as a result he has been very much bedbound for the last 2 days and not been eating or drinking much, further made him very weak.  He came to the ED for the first time last night for multiple complaints including cough bilateral shoulder pain bilateral leg weakness, and wound on his buttocks.  TF placed d/t hypoglycemia/AMS; BSE generated to assess swallow function. 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  Recommendations may be updated based on patient status, additional functional criteria and insurance authorization.    Recommendations  Diet recommendations: Dysphagia 3 (mechanical soft);Honey-thick liquid;Other(comment) (ice) Liquids provided via: Cup Medication Administration: Crushed with puree Supervision: Full supervision/cueing for compensatory strategies Compensations: Slow rate;Small sips/bites                  Oral care QID;Oral care prior to ice chip/H20;Staff/trained caregiver to provide oral care   Intermittent Supervision/Assistance Dysphagia, oropharyngeal phase (R13.12)         Rolena Infante, MS [facility] SLP Acute Rehab Services Office [phone]   Chales Abrahams  11/20/2022, 8:55 AM

## 2022-11-20 NOTE — Progress Notes (Signed)
Physical Therapy Wound Treatment Patient Details  Name: [name] MRN: [id number] Date of Birth: 1950-11-09  Today's Date: 11/20/2022 Time: 1050-1150 Time Calculation (min): 60 min  Subjective  Subjective Assessment Subjective: Pt pleasant and agreeable to wound therapy Patient and Family Stated Goals: None stated Date of Onset:  (Unknown) Prior Treatments: Dressing changes  Pain Score:  Pt required IV pain meds prior to treatment. Very painful even with removal of dressings.   Wound Assessment  Wound / Incision (Open or Dehisced) 11/10/22 Other (Comment) Buttocks Left Pressure wound (Active)  Dressing Type Foam - Lift dressing to assess site every shift;Gauze (Comment);Santyl;Barrier Film (skin prep) 11/20/22 1400  Dressing Changed Changed 11/20/22 1400  Dressing Status Clean, Dry, Intact 11/20/22 1400  Dressing Change Frequency Daily 11/20/22 1400  Site / Wound Assessment Yellow;Black;Brown;Red 11/20/22 1400  % Wound base Red or Granulating 20% 11/20/22 1400  % Wound base Yellow/Fibrinous Exudate 20% 11/20/22 1400  % Wound base Black/Eschar 60% 11/20/22 1400  % Wound base Other/Granulation Tissue (Comment) 0% 11/20/22 1400  Peri-wound Assessment Erythema (blanchable) 11/20/22 1400  Wound Length (cm) 9.8 cm 11/17/22 1119  Wound Width (cm) 8 cm 11/17/22 1119  Wound Depth (cm) 0.1 cm 11/17/22 1119  Wound Volume (cm^3) 7.84 cm^3 11/17/22 1119  Wound Surface Area (cm^2) 78.4 cm^2 11/17/22 1119  Tunneling (cm) 0 11/11/22 1500  Undermining (cm) 0 11/11/22 1500  Margins Unattached edges (unapproximated) 11/20/22 1400  Closure None 11/20/22 1400  Drainage Amount Minimal 11/20/22 1400  Drainage Description Serosanguineous 11/20/22 1400  Treatment Debridement (Selective);Irrigation;Packing (Dry gauze) 11/20/22 1400     Wound / Incision (Open or Dehisced) 11/10/22 Other (Comment) Buttocks Right Pressure Wound (Active)  Dressing Type Foam - Lift dressing to assess site every  shift;Gauze (Comment);Honey;Barrier Film (skin prep) 11/20/22 1400  Dressing Changed Changed 11/20/22 1400  Dressing Status Clean, Dry, Intact 11/20/22 1400  Dressing Change Frequency Daily 11/20/22 1400  Site / Wound Assessment Yellow;Red 11/20/22 1400  % Wound base Red or Granulating 95% 11/20/22 1400  % Wound base Yellow/Fibrinous Exudate 5% 11/20/22 1400  % Wound base Black/Eschar 0% 11/20/22 1400  % Wound base Other/Granulation Tissue (Comment) 0% 11/20/22 1400  Peri-wound Assessment Erythema (blanchable) 11/20/22 1400  Wound Length (cm) 6.6 cm 11/17/22 1119  Wound Width (cm) 6 cm 11/17/22 1119  Wound Depth (cm) 0.1 cm 11/17/22 1119  Wound Volume (cm^3) 3.96 cm^3 11/17/22 1119  Wound Surface Area (cm^2) 39.6 cm^2 11/17/22 1119  Tunneling (cm) 0 11/11/22 1500  Undermining (cm) 0 11/11/22 1500  Margins Unattached edges (unapproximated) 11/20/22 1400  Closure None 11/20/22 1400  Drainage Amount Minimal 11/20/22 1400  Drainage Description Serosanguineous 11/20/22 1400  Treatment Irrigation;Packing (Dry gauze) 11/20/22 1400   Selective Debridement (non-excisional) Selective Debridement (non-excisional) - Location: Bilateral buttocks Selective Debridement (non-excisional) - Tools Used: Forceps, Scalpel, Scissors Selective Debridement (non-excisional) - Tissue Removed: Eschar and slough    Wound Assessment and Plan  Wound Therapy - Assess/Plan/Recommendations Wound Therapy - Clinical Statement: Progressing with debridement of the L buttock. Right buttock clean with very minimal slough present. Will sign off on R buttock and continue with treatment of the L side. This patient will benefit from continued wound therapy for selective removal of unviable tissue, to decrease bioburden, and promote wound bed healing. Wound Therapy - Functional Problem List: Decreased tolerance for OOB and positioning due to wounds Factors Delaying/Impairing Wound Healing: Infection - systemic/local,  Immobility Hydrotherapy Plan: Debridement, Dressing change, Patient/family education Wound Therapy - Frequency: 2X /  week Wound Therapy - Follow Up Recommendations: dressing changes by RN  Wound Therapy Goals- Improve the function of patient's integumentary system by progressing the wound(s) through the phases of wound healing (inflammation - proliferation - remodeling) by: Wound Therapy Goals - Improve the function of patient's integumentary system by progressing the wound(s) through the phases of wound healing by: Decrease Necrotic Tissue to: 20% Decrease Necrotic Tissue - Progress: Progressing toward goal Increase Granulation Tissue to: 80% Increase Granulation Tissue - Progress: Progressing toward goal Goals/treatment plan/discharge plan were made with and agreed upon by patient/family: No, Patient unable to participate in goals/treatment/discharge plan and family unavailable Time For Goal Achievement: 7 days Wound Therapy - Potential for Goals: Good  Goals will be updated until maximal potential achieved or discharge criteria met.  Discharge criteria: when goals achieved, discharge from hospital, MD decision/surgical intervention, no progress towards goals, refusal/missing three consecutive treatments without notification or medical reason.  GP     Charges PT Wound Care Charges $Wound Debridement up to 20 cm: < or equal to 20 cm $ Wound Debridement each add'l 20 sqcm: 3 $PT Hydrotherapy Visit: 1 Visit       Marylynn Pearson 11/20/2022, 3:04 PM  Conni Slipper, PT, DPT Acute Rehabilitation Services Secure Chat Preferred Office: [phone]
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
## 2022-11-20 NOTE — Progress Notes (Signed)
Refused cpap, says he does not wear one.
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
## 2022-11-20 NOTE — TOC Progression Note (Signed)
Transition of Care [facility]) - Progression Note    Patient Details  Name: [name] MRN: [id number] Date of Birth: 12-13-1950  [facility]) CM/SW Contact  [name], LCSW Phone Number: 11/20/2022, 4:06 PM  Clinical Narrative:    CSW faxed out referrals to SNFs as no bed offers currently. CSW placed pasrr on Fl2.    Expected Discharge Plan: Skilled Nursing Facility Barriers to Discharge: Continued Medical Work up, SNF Pending bed offer  Expected Discharge Plan and Services In-house Referral: Clinical Social Work   Post Acute Care Choice: Skilled Nursing Facility Living arrangements for the past 2 months: Homeless Shelter                                       Social Determinants of Health (SDOH) Interventions SDOH Screenings   Food Insecurity: Food Insecurity Present (11/11/2022)  Housing: High Risk (11/11/2022)  Transportation Needs: Unmet Transportation Needs (11/11/2022)  Utilities: At Risk (11/11/2022)  Alcohol Screen: Low Risk  (03/20/2022)  Depression (PHQ2-9): Low Risk  (08/27/2022)  Recent Concern: Depression (PHQ2-9) - Medium Risk (06/01/2022)  Financial Resource Strain: Medium Risk (03/18/2022)  Physical Activity: Sufficiently Active (03/20/2022)  Social Connections: Socially Isolated (03/20/2022)  Stress: No Stress Concern Present (03/20/2022)  Tobacco Use: High Risk (11/10/2022)    Readmission Risk Interventions     No data to display

## 2022-11-21 DIAGNOSIS — A419 Sepsis, unspecified organism: Secondary | ICD-10-CM | POA: Diagnosis not present

## 2022-11-21 LAB — BASIC METABOLIC PANEL
Anion gap: 6 (ref 5–15)
BUN: 17 mg/dL (ref 8–23)
CO2: 28 mmol/L (ref 22–32)
Calcium: 7.9 mg/dL — ABNORMAL LOW (ref 8.9–10.3)
Chloride: 99 mmol/L (ref 98–111)
Creatinine, Ser: 0.9 mg/dL (ref 0.61–1.24)
GFR, Estimated: >60 mL/min (ref >60)
Glucose, Bld: 100 mg/dL — ABNORMAL HIGH (ref 70–99)
Potassium: 3.5 mmol/L (ref 3.5–5.1)
Sodium: 133 mmol/L — ABNORMAL LOW (ref 135–145)

## 2022-11-21 LAB — CBC
HCT: 34.2 % — ABNORMAL LOW (ref 39.0–52.0)
Hemoglobin: 11.4 g/dL — ABNORMAL LOW (ref 13.0–17.0)
MCH: 30.3 pg (ref 26.0–34.0)
MCHC: 33.3 g/dL (ref 30.0–36.0)
MCV: 91 fL (ref 80.0–100.0)
Platelets: 358 10*3/uL (ref 150–400)
RBC: 3.76 MIL/uL — ABNORMAL LOW (ref 4.22–5.81)
RDW: 13.9 % (ref 11.5–15.5)
WBC: 14.1 10*3/uL — ABNORMAL HIGH (ref 4.0–10.5)
nRBC: 0 % (ref 0.0–0.2)

## 2022-11-21 LAB — GLUCOSE, CAPILLARY
Glucose-Capillary: 109 mg/dL — ABNORMAL HIGH (ref 70–99)
Glucose-Capillary: 118 mg/dL — ABNORMAL HIGH (ref 70–99)
Glucose-Capillary: 132 mg/dL — ABNORMAL HIGH (ref 70–99)
Glucose-Capillary: 91 mg/dL (ref 70–99)

## 2022-11-21 NOTE — Progress Notes (Signed)
Refused cpap

## 2022-11-21 NOTE — Progress Notes (Signed)
Refused cpap, does not use.

## 2022-11-21 NOTE — Progress Notes (Signed)
   11/21/22 0740  Chest Physiotherapy Tx  CPT Delivery Source Flutter valve  $ Flutter Green Yes  CPT Duration 10  CPT Chest Site Full range  Post-Treatment Respirations 18  Cough Non-productive;Weak  Position Supine  CPT Treatment Tolerance Tolerated well
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
## 2022-11-22 DIAGNOSIS — A419 Sepsis, unspecified organism: Secondary | ICD-10-CM | POA: Diagnosis not present

## 2022-11-22 MED ORDER — EZETIMIBE 10 MG PO TABS
10.0000 mg | ORAL_TABLET | Freq: Every day | ORAL | Status: DC
  Administered 2022-11-23 – 2022-11-26 (×4): 10 mg via ORAL
  Filled 2022-11-22 (×4): qty 1

## 2022-11-22 MED ORDER — OLANZAPINE 5 MG PO TABS
20.0000 mg | ORAL_TABLET | Freq: Every day | ORAL | Status: DC
  Administered 2022-11-23 – 2022-11-25 (×3): 20 mg via ORAL
  Filled 2022-11-22 (×3): qty 4

## 2022-11-22 MED ORDER — ADULT MULTIVITAMIN W/MINERALS CH
1.0000 | ORAL_TABLET | Freq: Every day | ORAL | Status: DC
  Administered 2022-11-23 – 2022-11-26 (×4): 1 via ORAL
  Filled 2022-11-22 (×4): qty 1

## 2022-11-22 MED ORDER — MIDODRINE HCL 5 MG PO TABS
10.0000 mg | ORAL_TABLET | Freq: Three times a day (TID) | ORAL | Status: DC
  Administered 2022-11-23 – 2022-11-26 (×10): 10 mg via ORAL
  Filled 2022-11-22 (×10): qty 2

## 2022-11-22 MED ORDER — HYDROCODONE-ACETAMINOPHEN 5-325 MG PO TABS
1.0000 | ORAL_TABLET | Freq: Three times a day (TID) | ORAL | Status: DC | PRN
  Administered 2022-11-22 – 2022-11-26 (×8): 1 via ORAL
  Filled 2022-11-22 (×9): qty 1

## 2022-11-22 MED ORDER — BUSPIRONE HCL 5 MG PO TABS
15.0000 mg | ORAL_TABLET | Freq: Three times a day (TID) | ORAL | Status: DC
  Administered 2022-11-23 – 2022-11-26 (×10): 15 mg via ORAL
  Filled 2022-11-22 (×10): qty 3

## 2022-11-22 MED ORDER — THIAMINE MONONITRATE 100 MG PO TABS
100.0000 mg | ORAL_TABLET | Freq: Every day | ORAL | Status: DC
  Administered 2022-11-23 – 2022-11-26 (×4): 100 mg via ORAL
  Filled 2022-11-22 (×4): qty 1

## 2022-11-22 MED ORDER — GERHARDT'S BUTT CREAM
TOPICAL_CREAM | Freq: Three times a day (TID) | CUTANEOUS | Status: DC
  Administered 2022-11-23 – 2022-11-26 (×4): 1 via TOPICAL
  Filled 2022-11-22: qty 1

## 2022-11-22 MED ORDER — ROSUVASTATIN CALCIUM 20 MG PO TABS
40.0000 mg | ORAL_TABLET | Freq: Every day | ORAL | Status: DC
  Administered 2022-11-23 – 2022-11-26 (×4): 40 mg via ORAL
  Filled 2022-11-22 (×4): qty 2

## 2022-11-22 MED ORDER — PRAZOSIN HCL 1 MG PO CAPS
1.0000 mg | ORAL_CAPSULE | Freq: Every day | ORAL | Status: DC
  Administered 2022-11-23 – 2022-11-24 (×2): 1 mg via ORAL
  Filled 2022-11-22 (×2): qty 1

## 2022-11-22 MED ORDER — JUVEN PO PACK
1.0000 | PACK | Freq: Two times a day (BID) | ORAL | Status: DC
  Administered 2022-11-23 – 2022-11-24 (×3): 1 via ORAL
  Filled 2022-11-22 (×3): qty 1

## 2022-11-22 NOTE — Progress Notes (Signed)
Mobility Specialist - Progress Note   11/22/22 1117  Mobility  Activity Refused mobility   Pt refused mobility stating that he was have pain in his bottom and did not want to get up. Will continue to follow.   Alda Lea  Mobility Specialist Please contact via Special educational needs teacher or Rehab office at [phone]
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
## 2022-11-22 NOTE — Plan of Care (Signed)
  Problem: Education: Goal: Knowledge of General Education information will improve Description Including pain rating scale, medication(s)/side effects and non-pharmacologic comfort measures Outcome: Progressing   Problem: Health Behavior/Discharge Planning: Goal: Ability to manage health-related needs will improve Outcome: Progressing   

## 2022-11-23 DIAGNOSIS — A419 Sepsis, unspecified organism: Secondary | ICD-10-CM | POA: Diagnosis not present

## 2022-11-23 MED ORDER — LOPERAMIDE HCL 2 MG PO CAPS
2.0000 mg | ORAL_CAPSULE | Freq: Four times a day (QID) | ORAL | Status: DC | PRN
  Administered 2022-11-23: 2 mg via ORAL
  Filled 2022-11-23: qty 1

## 2022-11-23 NOTE — TOC Progression Note (Signed)
Transition of Care [facility]) - Progression Note    Patient Details  Name: [name] MRN: [id number] Date of Birth: 09/13/1950  Transition of Care [facility] CM/SW Contact  [name], LCSW Phone Number: 11/23/2022, 8:28 AM  Clinical Narrative:    CSW requested [facility] SNFs review patient.    Expected Discharge Plan: Skilled Nursing Facility Barriers to Discharge: Continued Medical Work up, SNF Pending bed offer  Expected Discharge Plan and Services In-house Referral: Clinical Social Work   Post Acute Care Choice: Skilled Nursing Facility Living arrangements for the past 2 months: Homeless Shelter                                       Social Determinants of Health (SDOH) Interventions SDOH Screenings   Food Insecurity: Food Insecurity Present (11/11/2022)  Housing: High Risk (11/11/2022)  Transportation Needs: Unmet Transportation Needs (11/11/2022)  Utilities: At Risk (11/11/2022)  Alcohol Screen: Low Risk  (03/20/2022)  Depression (PHQ2-9): Low Risk  (08/27/2022)  Recent Concern: Depression (PHQ2-9) - Medium Risk (06/01/2022)  Financial Resource Strain: Medium Risk (03/18/2022)  Physical Activity: Sufficiently Active (03/20/2022)  Social Connections: Socially Isolated (03/20/2022)  Stress: No Stress Concern Present (03/20/2022)  Tobacco Use: High Risk (11/10/2022)    Readmission Risk Interventions     No data to display

## 2022-11-23 NOTE — Consult Note (Addendum)
WOC Nurse Consult Note: Reason for Consult:Patient inquiring about hydrotherapy for pressure injury Wound type:pressure Pressure Injury POA: Yes Patient is on a Tuesday/Friday schedule for hydrotherapy with PT. Bedside RN inquiring regarding alternate plan for indwelling bowel management system in anticipation of discharge to SNF. I offer the external fecal management pouch, [facility] [place] [phone] for the team's consideration.   A pressure redistribution chair cushion is ordered today for use when OOB in chair. He may take this with him at time of discharge.  WOC nursing team will not follow, but will remain available to this patient, the nursing and medical teams.  Please re-consult if needed.  Thank you for inviting [place] to participate in this patient's Plan of Care.  Ladona Mow, MSN, RN, CNS, GNP, Leda Min, [employer], [employer] phone:  [phone]

## 2022-11-23 NOTE — Plan of Care (Signed)
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
## 2022-11-23 NOTE — Progress Notes (Signed)
Speech Language Pathology Treatment: Dysphagia  Patient Details Name: [name] MRN: [id number] DOB: [birth year] Today's Date: 11/23/2022 Time: 1207-1229 SLP Time Calculation (min) (ACUTE ONLY): 22 min  Assessment / Plan / Recommendation Clinical Impression  Pt seen for ongoing dysphagia management.  RN reports no adverse events on PO diet.  Pt reports doing well with POs. Cortrak has been removed. Pt tolerated honey thick liquid, puree, and regular solids with no clinical s/s of aspiration.  Pt completed exercises as noted below.  He fatigued across trials and seemed slightly weaker with swallow exercises today as compared to last week.    Recommend continuing regular texture diet with honey thick liquids.   Swallowing Exercises  Effortful swallow Reps: 10 Effort/Accuracy: Seemingly good  CTAR Reps: 10 Effort/Accuracy: Good Comments: Fatigued across trials  [name] Reps: 5 Effort/Accuracy: Good Comments: Required break to complete 5, fatigued across trials.     HPI HPI: [name] is a 72 y.o. male who presented with generalized weakness and frequent falls.    Patient reported that for the last 2 to 3 days has been feeling generalized weakness, with frequent falls and as a result he has been very much bedbound for the last 2 days and not been eating or drinking much, further made him very weak.  He came to the ED for the first time last night for multiple complaints including cough bilateral shoulder pain bilateral leg weakness, and wound on his buttocks.  TF placed d/t hypoglycemia/AMS; BSE generated to assess swallow function. Pt with medical history significant of alcohol abuse, HTN and chronic HFpEF, schizophrenia, anxiety/depression, homeless, TBI 1975 with residual dysarthria      SLP Plan  Continue with current plan of care      Recommendations for follow up therapy are one component of a multi-disciplinary discharge planning process, led by the attending  physician.  Recommendations may be updated based on patient status, additional functional criteria and insurance authorization.    Recommendations  Diet recommendations: Regular;Honey-thick liquid Liquids provided via: Cup Medication Administration: Crushed with puree Supervision: Intermittent supervision to cue for compensatory strategies Compensations: Slow rate;Small sips/bites Postural Changes and/or Swallow Maneuvers: Seated upright 90 degrees;Upright 30-60 min after meal                  Oral care BID;Oral care prior to ice chip/H20;Staff/trained caregiver to provide oral care   Intermittent Supervision/Assistance Dysphagia, oropharyngeal phase (R13.12)     Continue with current plan of care     Kerrie Pleasure, MA, CCC-SLP Acute Rehabilitation Services Office: [phone] 11/23/2022, 12:40 PM

## 2022-11-23 NOTE — Progress Notes (Signed)
Mobility Specialist - Progress Note   11/23/22 1035  Mobility  Activity Transferred from bed to chair  Level of Assistance Minimal assist, patient does 75% or more  Assistive Device Front wheel walker  Activity Response Tolerated well  Mobility Referral Yes  $Mobility charge 1 Mobility   Pt was received in bed and agreeable to mobility. Pt c/o buttock pain rating it a 3/10. Pt was left in chair with all needs met and chair alarm on.   Alda Lea  Mobility Specialist Please contact via Special educational needs teacher or Rehab office at [phone]

## 2022-11-23 NOTE — Progress Notes (Signed)
Mobility Specialist - Progress Note   11/23/22 1154  Mobility  Activity Transferred from chair to bed  Level of Assistance Minimal assist, patient does 75% or more  Assistive Device Front wheel walker  Activity Response Tolerated well  Mobility Referral Yes  $Mobility charge 1 Mobility   Pt was received in chair and requesting to get back to bed. No complaints throughout transfer. Pt was left in bed with all needs met and bed alarm on.   Alda Lea  Mobility Specialist Please contact via Special educational needs teacher or Rehab office at [phone]

## 2022-11-23 NOTE — Plan of Care (Signed)

## 2022-11-23 NOTE — Progress Notes (Signed)
Pt refusing nocturnal BIPAP @ this time. No unit. No distress on RA. RT to follow.

## 2022-11-23 NOTE — Progress Notes (Signed)
PT Cancellation Note  Patient Details Name: [name] MRN: [id number] DOB: 1950-09-25   Cancelled Treatment:    Reason Eval/Treat Not Completed: RN starting wound care. Will follow up for PT treatment as schedule permits.  Ina Homes, PT, DPT Acute Rehabilitation Services  Personal: Secure Chat Rehab Office: [phone]  Malachy Chamber 11/23/2022, 4:43 PM

## 2022-11-24 DIAGNOSIS — A419 Sepsis, unspecified organism: Secondary | ICD-10-CM | POA: Diagnosis not present

## 2022-11-24 MED ORDER — MORPHINE SULFATE (PF) 2 MG/ML IV SOLN
1.0000 mg | Freq: Two times a day (BID) | INTRAVENOUS | Status: DC | PRN
  Administered 2022-11-24 – 2022-11-26 (×3): 1 mg via INTRAVENOUS
  Filled 2022-11-24 (×3): qty 1

## 2022-11-24 NOTE — Progress Notes (Addendum)
Nutrition Follow-up  DOCUMENTATION CODES:   Not applicable  INTERVENTION:   Discontinue Juven Continue Multivitamin w/ minerals daily Encourage good PO intake  Magic cup TID with meals, each supplement provides 290 kcal and 9 grams of protein  NUTRITION DIAGNOSIS:   Increased nutrient needs related to wound healing as evidenced by estimated needs. - Ongoing  GOAL:   Patient will meet greater than or equal to 90% of their needs - Ongoing  MONITOR:   PO intake, Supplement acceptance, Labs, I & O's  REASON FOR ASSESSMENT:   Consult Enteral/tube feeding initiation and management  ASSESSMENT:   Pt with hx of HTN, CHF schizophrenia, homelessness, and EtOH abuse presented to ED with weakness and with a recent fall. Pt with sepsis on admission related to large wound to the buttocks.  4/10 - Cortrak placed 4/18 - MBS; diet advanced to regular/honey thick liquids 4/19 - Cortrak removed  Pt laying in bed. Reports that he has been eating ok, but not eating as much due to concern for having a bowel movement. Reports that he is drinking ok, states he is drinking cola, tea, and orange juice. Happy Cortrak was removed.   Ongoing discharge planning occurring.   Meal Intakes 4/18-4/22: 50-100% x 5 meals   Medications reviewed and include: MVI, Thiamine Labs reviewed.   Diet Order:   Diet Order             Diet regular Fluid consistency: Honey Thick; Fluid restriction: 1200 mL Fluid  Diet effective now                   EDUCATION NEEDS:   No education needs have been identified at this time  Skin:  Skin Assessment: Reviewed RN Assessment Per WOC note on 4/09:  1. L buttock 9 cms x 7 cms 100% black leathery devitalized tissue  2.  R buttock 5 cms x 9 cms 100% black leathery devitalized tissue  3.  Bilateral knees partial thickness, likely traumatic; L knee 3 cms x 2 cms R knee 2 cms x 3 cms 100% pink and moist   Last BM:  4/23  Height:  Ht Readings from Last 1  Encounters:  11/10/22  (1.88 m)   Weight:  Wt Readings from Last 1 Encounters:  11/22/22 84.1 kg   Ideal Body Weight:  86.4 kg  BMI:  Body mass index is 23.8 kg/m.  Estimated Nutritional Needs:  Kcal:  2500-2800 kcal/d Protein:  125-150 g/d Fluid:  2.5L/d   Kirby Crigler RD, LDN Clinical Dietitian See [facility] for contact information.

## 2022-11-24 NOTE — Progress Notes (Signed)
Physical Therapy Wound Treatment Patient Details  Name: [name] MRN: [id number] Date of Birth: 10-Sep-1950  Today's Date: 11/24/2022 Time: 4098-1191 Time Calculation (min): 38 min  Subjective  Subjective Assessment Subjective: Pt pleasant and agreeable to wound therapy - asking for more pain meds Patient and Family Stated Goals: None stated Date of Onset:  (Unknown) Prior Treatments: Dressing changes  Pain Score:  Increased pain this session as IV pain meds d/c'd since last wound therapy session.   Wound Assessment  Wound / Incision (Open or Dehisced) 11/10/22 Other (Comment) Buttocks Left Pressure wound (Active)  Wound Image   11/24/22 1317  Dressing Type Foam - Lift dressing to assess site every shift;Gauze (Comment);Honey;Barrier Film (skin prep) 11/24/22 1317  Dressing Changed Changed 11/24/22 1317  Dressing Status Clean, Dry, Intact 11/24/22 1317  Dressing Change Frequency Daily 11/24/22 1317  Site / Wound Assessment Red;Yellow 11/24/22 1317  % Wound base Red or Granulating 60% 11/24/22 1317  % Wound base Yellow/Fibrinous Exudate 40% 11/24/22 1317  % Wound base Black/Eschar 0% 11/24/22 1317  % Wound base Other/Granulation Tissue (Comment) 0% 11/24/22 1317  Peri-wound Assessment Erythema (blanchable);Pink;Purple 11/24/22 1317  Wound Length (cm) 9.8 cm 11/24/22 1317  Wound Width (cm) 8 cm 11/24/22 1317  Wound Depth (cm) 3.5 cm 11/24/22 1317  Wound Volume (cm^3) 274.4 cm^3 11/24/22 1317  Wound Surface Area (cm^2) 78.4 cm^2 11/24/22 1317  Tunneling (cm) 0 11/24/22 1317  Undermining (cm) 0 11/24/22 1317  Margins Unattached edges (unapproximated) 11/24/22 1317  Closure None 11/24/22 1317  Drainage Amount Minimal 11/24/22 1317  Drainage Description Serosanguineous 11/24/22 1317  Non-staged Wound Description Full thickness 11/24/22 1317  Treatment Debridement (Selective);Irrigation;Packing (Dry gauze) 11/24/22 1317      Selective Debridement (non-excisional) Selective  Debridement (non-excisional) - Location: Bilateral buttocks Selective Debridement (non-excisional) - Tools Used: Forceps, Scalpel, Scissors Selective Debridement (non-excisional) - Tissue Removed: slough    Wound Assessment and Plan  Wound Therapy - Assess/Plan/Recommendations Wound Therapy - Clinical Statement: Progressing with debridement of the L buttock. Wound bed significantly improved this session. This patient will benefit from continued wound therapy for selective removal of unviable tissue, to decrease bioburden, and promote wound bed healing. Wound Therapy - Functional Problem List: Decreased tolerance for OOB and positioning due to wounds Factors Delaying/Impairing Wound Healing: Infection - systemic/local, Immobility Hydrotherapy Plan: Debridement, Dressing change, Patient/family education Wound Therapy - Frequency: 2X / week Wound Therapy - Follow Up Recommendations: dressing changes by RN  Wound Therapy Goals- Improve the function of patient's integumentary system by progressing the wound(s) through the phases of wound healing (inflammation - proliferation - remodeling) by: Wound Therapy Goals - Improve the function of patient's integumentary system by progressing the wound(s) through the phases of wound healing by: Decrease Necrotic Tissue to: 20% Decrease Necrotic Tissue - Progress: Progressing toward goal Increase Granulation Tissue to: 80% Increase Granulation Tissue - Progress: Progressing toward goal Goals/treatment plan/discharge plan were made with and agreed upon by patient/family: No, Patient unable to participate in goals/treatment/discharge plan and family unavailable Time For Goal Achievement: 7 days Wound Therapy - Potential for Goals: Good  Goals will be updated until maximal potential achieved or discharge criteria met.  Discharge criteria: when goals achieved, discharge from hospital, MD decision/surgical intervention, no progress towards goals, refusal/missing  three consecutive treatments without notification or medical reason.  GP     Charges PT Wound Care Charges $Wound Debridement up to 20 cm: < or equal to 20 cm $ Wound Debridement each add'l 20  sqcm: 1 $PT Hydrotherapy Dressing: 1 dressing $PT Hydrotherapy Visit: 1 Visit       Marylynn Pearson 11/24/2022, 1:24 PM  Conni Slipper, PT, DPT Acute Rehabilitation Services Secure Chat Preferred Office: [phone]
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
## 2022-11-25 DIAGNOSIS — A419 Sepsis, unspecified organism: Secondary | ICD-10-CM | POA: Diagnosis not present

## 2022-11-25 LAB — BASIC METABOLIC PANEL
Anion gap: 8 (ref 5–15)
BUN: 15 mg/dL (ref 8–23)
CO2: 30 mmol/L (ref 22–32)
Calcium: 8.5 mg/dL — ABNORMAL LOW (ref 8.9–10.3)
Chloride: 100 mmol/L (ref 98–111)
Creatinine, Ser: 0.84 mg/dL (ref 0.61–1.24)
GFR, Estimated: >60 mL/min (ref >60)
Glucose, Bld: 118 mg/dL — ABNORMAL HIGH (ref 70–99)
Potassium: 3.7 mmol/L (ref 3.5–5.1)
Sodium: 138 mmol/L (ref 135–145)

## 2022-11-25 LAB — CORTISOL: Cortisol, Plasma: 5.8 ug/dL

## 2022-11-25 LAB — TSH: TSH: 1.803 u[IU]/mL (ref 0.350–4.500)

## 2022-11-25 MED ORDER — LACTATED RINGERS IV SOLN
INTRAVENOUS | Status: AC
  Administered 2022-11-25: 125 mL/h via INTRAVENOUS
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
## 2022-11-25 NOTE — Progress Notes (Signed)
Mobility Specialist - Progress Note   11/25/22 1211  Mobility  Activity Refused mobility   Pt refused mobility d/t just having a wound dressing change and being in pain. Will follow up as time permits.   Alda Lea  Mobility Specialist Please contact via Special educational needs teacher or Rehab office at [phone]

## 2022-11-26 ENCOUNTER — Encounter (HOSPITAL_COMMUNITY): Payer: Medicare Other | Admitting: Student

## 2022-11-26 DIAGNOSIS — A419 Sepsis, unspecified organism: Secondary | ICD-10-CM | POA: Diagnosis not present

## 2022-11-26 MED ORDER — EZETIMIBE 10 MG PO TABS: 10.0000 mg | ORAL_TABLET | Freq: Every day | ORAL | 3 refills | Status: DC

## 2022-11-26 MED ORDER — FINASTERIDE 5 MG PO TABS: 5.0000 mg | ORAL_TABLET | Freq: Every day | ORAL | Status: DC

## 2022-11-26 MED ORDER — ACETAMINOPHEN 325 MG PO TABS: 650.0000 mg | ORAL_TABLET | Freq: Four times a day (QID) | ORAL | Status: DC | PRN

## 2022-11-26 MED ORDER — VITAMIN B-1 100 MG PO TABS: 100.0000 mg | ORAL_TABLET | Freq: Every day | ORAL | Status: DC

## 2022-11-26 MED ORDER — MIDODRINE HCL 10 MG PO TABS: 10.0000 mg | ORAL_TABLET | Freq: Three times a day (TID) | ORAL | Status: DC

## 2022-11-26 NOTE — Progress Notes (Signed)
This nurse called report to a [place], D LPN at [facility] where patient will transfer to. All questions answered at this time.

## 2022-11-26 NOTE — TOC Progression Note (Signed)
Transition of Care [facility]) - Progression Note    Patient Details  Name: [name] MRN: [id number] Date of Birth: 1951-03-22  [facility]) CM/SW Contact  [name], LCSW Phone Number: 11/26/2022, 9:48 AM  Clinical Narrative:    CSW met with patient who is very pleasant. He is in agreement with discharge to [facility] today for rehab. CSW explained process of what would happen with his SSI check in the event he needs to stay past what Medicare covers and he is in agreement with that as well if needed. CSW will arrange non emergency ambulance transport.    Expected Discharge Plan: Skilled Nursing Facility Barriers to Discharge: Barriers Resolved  Expected Discharge Plan and Services In-house Referral: Clinical Social Work   Post Acute Care Choice: Skilled Nursing Facility Living arrangements for the past 2 months: Homeless Shelter                                       Social Determinants of Health (SDOH) Interventions SDOH Screenings   Food Insecurity: Food Insecurity Present (11/11/2022)  Housing: High Risk (11/11/2022)  Transportation Needs: Unmet Transportation Needs (11/11/2022)  Utilities: At Risk (11/11/2022)  Alcohol Screen: Low Risk  (03/20/2022)  Depression (PHQ2-9): Low Risk  (08/27/2022)  Recent Concern: Depression (PHQ2-9) - Medium Risk (06/01/2022)  Financial Resource Strain: Medium Risk (03/18/2022)  Physical Activity: Sufficiently Active (03/20/2022)  Social Connections: Socially Isolated (03/20/2022)  Stress: No Stress Concern Present (03/20/2022)  Tobacco Use: High Risk (11/10/2022)    Readmission Risk Interventions     No data to display

## 2022-11-26 NOTE — TOC Transition Note (Signed)
Transition of Care [facility]) - CM/SW Discharge Note   Patient Details  Name: [name] MRN: [id number] Date of Birth: 02-13-1951  Transition of Care [facility]) CM/SW Contact:  [name], LCSW Phone Number: 11/26/2022, 1:38 PM   Clinical Narrative:    Patient will DC to: [facility] Anticipated DC date: 11/26/22 Family notified: Pt declined Transport by: Sharin Mons   Per MD patient ready for DC to [facility]. RN to call report prior to discharge [phone] room 120A). RN, patient, patient's family, and facility notified of DC. Discharge Summary and FL2 sent to facility. DC packet on chart. Ambulance transport requested for patient.   CSW will sign off for now as social work intervention is no longer needed. Please consult [place] again if new needs arise.     Final next level of care: Skilled Nursing Facility Barriers to Discharge: Barriers Resolved   Patient Goals and CMS Choice CMS Medicare.gov Compare Post Acute Care list provided to:: Patient Choice offered to / list presented to : Patient  Discharge Placement     Existing PASRR number confirmed : 11/26/22          Patient chooses bed at:  [facility]) Patient to be transferred to facility by: [facility] Name of family member notified: Pt declined Patient and family notified of of transfer: 11/26/22  Discharge Plan and Services Additional resources added to the After Visit Summary for   In-house Referral: Clinical Social Work   Post Acute Care Choice: Skilled Nursing Facility                               Social Determinants of Health (SDOH) Interventions SDOH Screenings   Food Insecurity: Food Insecurity Present (11/11/2022)  Housing: High Risk (11/11/2022)  Transportation Needs: Unmet Transportation Needs (11/11/2022)  Utilities: At Risk (11/11/2022)  Alcohol Screen: Low Risk  (03/20/2022)  Depression (PHQ2-9): Low Risk  (08/27/2022)  Recent Concern: Depression (PHQ2-9) - Medium Risk (06/01/2022)   Financial Resource Strain: Medium Risk (03/18/2022)  Physical Activity: Sufficiently Active (03/20/2022)  Social Connections: Socially Isolated (03/20/2022)  Stress: No Stress Concern Present (03/20/2022)  Tobacco Use: High Risk (11/10/2022)     Readmission Risk Interventions     No data to display

## 2022-11-26 NOTE — Discharge Instructions (Addendum)
[place] assistance programs Crisis assistance programs   -[facility] Coordinated Entry Program. If you are experiencing homelessness in [place], [place], your first point of contact should be Coordinated Entry. You can reach Coordinated Entry by calling (336) 553-2716 or by emailing [email or web address].  Community access points: [place] [facility] ([address], [place]) every Tuesday from 9am-10am. [facility] ([address], [place]) every Wednesday from 8am-9am.   -The [facility] [place] [facility] (336-271-5959) offers several services to local families, as funding allows. The Emergency Assistance Program (EAP), which they administer, provides household goods, free food, clothing, and financial aid to people in need in the [place] area. The EAP program does have some qualification, and counselors will interview clients for financial assistance by written referral only. Referrals need to be made by the Department of Social Services or by other EAP approved human services agencies or charities in the area.  -[facility] [place], which can be reached at (336) 885-0191, offers emergency assistance programs for those in need of help, such as food, rent assistance, a soup kitchen, shelter, and clothing. They are based in [place] but provide a number of services to those that qualify for assistance.   -[place] Department of Social Services may be able to offer temporary financial assistance and cash grants for paying rent and utilities, Help may be provided for local county residents who may be experiencing personal crisis when other resources, including government programs, are not available. Call (336) 845-7756  -[place] [facility] is a United [employer] funded nonprofit agency, The organization can offer emergency assistance for paying rent, electric bills, utilities,  food, household products and furniture. They offer extensive emergency and transitional housing for families, children and single women, and also run a Boy's and Girl's Club. Thrift Shops, Clothing Banks, and other aid offered too. [address], [place], [place] [address], (336) 881-5400  -[place] Low Income Energy Assistance Program -- This is offered for [place] families. The federal government created Low Income Energy Assistance Program provides a one-time cash grant payment to help eligible low-income families pay their electric and heating bills. [address], [place], [place] [address], (336) 641-3000  -[place] Emergency Assistance -- A program offers emergency utility and rent funds for greater [place] area residents. The program can also provide counseling and referrals to charities and government programs. Also provides food and a free meal program that serves lunch Mondays - Saturdays and dinner seven days per week to individuals in the community. [address], [place], [place] [address], (336) 885-0191  -[facility] - Offers affordable apartment and housing communities across      [place] and [place]. The low income and seniors can access public housing, rental assistance to qualified applicants, and apply for the section 8 rent subsidy program. Other programs include Family Self-Sufficiency and Neighborhood Resource Centers. [address], [place], [place] [address], dial (336) 275-8501.  -[facility] provides transitional housing to veterans and the disabled. Clients will also access other services too, including assistance in applying for Disability, life skills classes, case management, and assistance in finding permanent housing. [address], [place], [place] [address], call (336) 275-8585  -[facility] [place] Transitional Housing through [facility] is  for people who were just evicted or that are formerly homeless. The non-profit will also help then gain self-sufficiency, find a home or apartment   to live in, and also provides information on rent assistance when needed. Phone (336) 286-6401  -The [place] Weatherization Assistance Program helps low income, elderly, or disabled residents in seven counties in the [place] ([place], [place], [place], [place], [place], [place], [place], and [place]) save energy and reduce their utility bills by improving energy efficiency. Phone 336-904-0338.  -[facility] is located in the [place] in the [address], [address], [address], [place], [place] [address]. Parking is in the rear of the building. Phone: 336-691-9521   General Email: info@gso[facility].org  [facility] provides free housing counseling assistance in locating affordable rental housing or housing with support services for families and individuals in crisis and the chronically homeless. We provide potential resources for other housing needs like utilities. Our trained counselors also work with clients on budgeting and financial literacy in effort to empower them to take control of their financial situations. [facility] collaborates with homeless service providers and other stakeholders as part of the [place] COC (Continuum of Care). The (COC) is a regional/local planning body that coordinates housing and services funding for homeless families and individuals. The role of [facility] in the COC is through housing counseling to work with people we serve on diversion strategies for those that are at imminent risk of becoming homeless. We also work with the Coordinated Assessment/Entry Specialist who attempts to find temporary solutions and/or connects the people to Housing First, Rapid Re-housing or transitional housing programs. Our Homelessness Prevention Housing Counselors  meet with clients on business days (Monday-Fridays, except scheduled holidays) from 8:30 am to 4:30 pm.  Legal assistance for evictions, foreclosure, and more -If you need free legal advice on civil issues, such as foreclosures, evictions, utility bill disconnection, government programs, domestic issues and more, [facility] [place] ([facility]) is a nonprofit law firm that provides free legal services and counsel to lower income people, seniors, disabled, and others, The goal is to ensure everyone has access to justice and fair representation. Call them at 866-219-5262.  -[employer] [facility] [employer] [facility] can provide info about obtaining legal assistance with evictions. Phone 336-334-3731.  Training Services  The [employer] offers job and employment training. Resources are focused on helping students obtain the skills and experiences that are necessary to compete in today's challenging and tight job market. The non-profit faith-based community action agency offers internship trainings as well as classroom instruction. Classes are tailored to meet the needs of people in the [place] region. [place], [place] [address], (336) 691-5780  Foreclosure prevention/Debt Services [facility] [place] Consumer Credit Counseling Service inludes debt and foreclosure prevention programs for local families. This includes money management, financial advice, budget review and development of a written action plan with a nationally certified non-profit consumer credit counselor to help solve specific individual financial problems. In addition, housing and mortgage counselors can also provide pre- and post-purchase homeownership counseling, default resolution counseling (to prevent foreclosure) and reverse mortgage counseling. A Debt Management Program allows people and families with a high level of credit card or medical debt to consolidate and repay consumer debt and  loans to creditors and rebuild positive credit ratings and scores. Contact (336) 373-8882.  Community clinics in [place] -[facility]: [address], [place], [address]. (336) [phone].  -[facility] [place] [facility]: [address], [place], [address]. (336) [phone].  -[place] [facility] offers medical care through a group   of doctors, pharmacies and other healthcare related agencies that offer services for low income, uninsured adults in [place]. Also offers adult Dental care and assistance with applying for an [employer]. Call (769)101-4828.   [facility]. This center provides low-cost health care to those without health insurance. Services offered include an onsite pharmacy. Phone (601) 842-9401. [address], [address], [place].  -Medication Assistance Program serves as a link between pharmaceutical companies and patients to provide low cost or free prescription medications. This service is available for residents who meet certain income restrictions and have no insurance coverage. PLEASE CALL 330-617-6689 Ginette Otto) OR (506)[phone] ([place])  -One Step Further: Materials engineer, [employer] Support & Nutrition Program, [employer]. Call 706-097-8339/ 646-383-0832.  Food pantry and assistance -[facility]: [address], [place] [address]. Phone (304)064-4965  -[facility]: [address] [place], [place] [address]. 807-675-7758.  -[place] Food Finder: https://findfood.BargainContractor.si  FLEEING VIOLENCE:  -Family Services of the [place]- 24/7 Crisis line (726)708-9716) -[facility] Centers: (336) 641-SAFE 651-565-6300)   [place] 2-1-1 is another useful way to locate resources in the community. Visit ShedSizes.ch to find service information online. If you need  additional assistance, 2-1-1 Referral Specialists are available 24 hours a day, every day by dialing 2-1-1 or 973-284-2420 from any phone. The call is free, confidential, and available in any language.  Affordable Housing Search  http://www.nchousingsearch.org  [facility])   M-F 8a-3p [address] [place], [place] [address] ([phone] Services include: laundry, barbering, support groups, case management, phone & computer access, showers, AA/NA mtgs, mental health/substance abuse nurse, job skills class, disability information, VA assistance, spiritual classes, etc. Winter Shelter available when temperatures are less than 32 degrees.   HOMELESS SHELTERS [facility] at [facility]- Call [phone] ext. 347 or ext. 336. Located at [address], [place], [place] [address]  [facility]- Call [phone]. Located at [address] [place] [address].  [facility] [employer]. Call [phone]. Office located at [address] [place] [address].  [facility] through [facility] [phone].  [facility]- Call ([phone]) [phone]. Located at [address] [place], [place], [place] [address].  [facility]For Pregnant mothers. Call [phone]. Located at [address] [place], [address].  [facility]For men in [place]. Call [phone].  [facility] [employer] for [employer] 919-743-0574. Office located at [address] [place], [address].  [employer] be agreeable to help with chores. Call [phone] ext. 5000.  Men's: [address] , [place] [address]. Women's: [facility]  [address] [place], [place] [address]  Crisis Services [facility] Crisis Management- [phone]  [facility] [address] [place], [place] [address]. Phone:  [phone]    Follow with Primary MD Claiborne Rigg, NP in 7 days   Get CBC, CMP, 2 view Chest X ray -  checked next visit with your primary MD or SNF MD   Activity: As tolerated with Full fall precautions use walker/cane & assistance as needed  Disposition SNF  Diet: Heart Healthy with honey thick liquids with feeding assistance and aspiration precautions.   Special Instructions: If you have smoked or chewed Tobacco  in the last 2 yrs  please stop smoking, stop any regular Alcohol  and or any Recreational drug use.  On your next visit with your primary care physician please Get Medicines reviewed and adjusted.  Please request your Prim.MD to go over all Hospital Tests and Procedure/Radiological results at the follow up, please get all Hospital records sent to your Prim MD by signing hospital release before you go home.  If you experience worsening of your admission symptoms, develop shortness of breath, life threatening emergency, suicidal or homicidal thoughts you must seek medical attention immediately by calling 911 or calling your MD immediately  if symptoms less severe.  You Must read complete instructions/literature along with all the possible adverse reactions/side effects for all the Medicines you take and that have been prescribed to you. Take any new Medicines after you have completely understood and accpet all the possible adverse reactions/side effects.
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
## 2022-11-26 NOTE — Discharge Summary (Signed)
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 ability. There was a protrusion in cervical esophagus which appeared c/w a CP bar (see image below), but this did not prevent bolus passage.  There was retention of pill in esophagus during sweep, which was cleared by following pill with spoon full of honey thick liquid. This assessment is not diagnostic of esophagea dysphagia/dysmotility, but consider further workup of esophagus if pt c/o feeling of stasis once PO intake can be safely resumed. Pt intermittently follows directions, and may benefit for a trial of swallowing therapy to determine if he can participate in exercise program.  Recommend pt be NPO at present with alternate means of nutrition, hydration, and medication. Pt may have ice chips in moderation, after good oral care, when fully awake/alert, with upright positioning and direct supervision. Possible CP Bar: Factors that  may increase risk of adverse event in presence of aspiration Rubye Oaks & Clearance Coots 2021): Factors that may increase risk of adverse event in presence of aspiration Rubye Oaks & Clearance Coots 2021): Poor  general health and/or compromised immunity; Presence of tubes (ETT, trach, NG, etc.) (psychiatric history, social situation) Recommendations/Plan: Swallowing Evaluation Recommendations Swallowing Evaluation Recommendations Recommendations: NPO Medication Administration: Via alternative means Swallowing strategies  : Slow rate; Small bites/sips Oral care recommendations: Oral care QID (4x/day); Oral care before ice chips/water Recommended consults: Consider esophageal assessment (if indicated after safe PO intake resumes) Caregiver Recommendations: Avoid jello, ice cream, thin soups, popsicles Treatment Plan Treatment Plan Treatment recommendations: Therapy as outlined in treatment plan below Follow-up recommendations: -- (continue ST at next level of care) Functional status assessment: Patient has had a recent decline in their functional status and demonstrates the ability to make significant improvements in function in a reasonable and predictable amount of time. Treatment frequency: Min 2x/week Treatment duration: 2 weeks Interventions: Oropharyngeal exercises; Trials of upgraded texture/liquids Recommendations Recommendations for follow up therapy are one component of a multi-disciplinary discharge planning process, led by the attending physician.  Recommendations may be updated based on patient status, additional functional criteria and insurance authorization. Assessment: Orofacial Exam: Orofacial Exam Oral Cavity - Dentition: Adequate natural dentition; Missing dentition Anatomy: No data recorded Boluses Administered: Boluses Administered Boluses Administered: Thin liquids (Level 0); Mildly thick liquids (Level 2, nectar thick); Moderately thick liquids (Level 3, honey thick); Solid; Puree  Oral Impairment Domain: Oral  Impairment Domain Lip Closure: No labial escape Tongue control during bolus hold: Posterior escape of less than half of bolus Bolus preparation/mastication: Slow prolonged chewing/mashing with complete recollection Bolus transport/lingual motion: Brisk tongue motion Oral residue: Trace residue lining oral structures Location of oral residue : Tongue; Palate Initiation of pharyngeal swallow : Pyriform sinuses  Pharyngeal Impairment Domain: Pharyngeal Impairment Domain Soft palate elevation: No bolus between soft palate (SP)/pharyngeal wall (PW) Laryngeal elevation: Partial superior movement of thyroid cartilage/partial approximation of arytenoids to epiglottic petiole Anterior hyoid excursion: Partial anterior movement Epiglottic movement: No inversion Laryngeal vestibule closure: Incomplete, narrow column air/contrast in laryngeal vestibule Pharyngeal stripping wave : Present - diminished Pharyngeal contraction (A/P view only): N/A Tongue base retraction: Wide column of contrast or air between tongue base and PPW Pharyngeal residue: Collection of residue within or on pharyngeal structures Location of pharyngeal residue: Valleculae  Esophageal Impairment Domain: Esophageal Impairment Domain Esophageal clearance upright position: Esophageal retention Pill: Esophageal Impairment Domain Esophageal clearance upright position: Esophageal retention Penetration/Aspiration Scale Score: Penetration/Aspiration Scale Score 1.  Material does not enter airway: Solid; Pill 4.  Material enters airway, CONTACTS cords then ejected out: Puree 7.  Material enters airway, passes BELOW cords and not ejected out despite cough attempt by patient: Moderately thick liquids (Level 3, honey thick); Thin liquids (Level 0) 8.  Material enters airway, passes BELOW cords without attempt by patient to eject out (silent aspiration) : Mildly thick liquids (Level 2, nectar thick) Compensatory Strategies: Compensatory Strategies Compensatory  strategies: Yes Straw: Ineffective Ineffective Straw: Mildly thick liquid (Level 2, nectar thick) Effortful swallow: Ineffective Other(comment): Ineffective (Cued cough) Ineffective Other(comment): Mildly thick liquid (Level 2, nectar thick) (declined to execute)   General Information: No data recorded Diet Prior to this Study: NPO   No data recorded  No data recorded  No data recorded  No data recorded Behavior/Cognition: Alert; Cooperative; Distractible; Impulsive No data recorded No data recorded No data recorded Volitional Swallow: Able to elicit Exam Limitations: No limitations Goal Planning: Prognosis for improved oropharyngeal function: Fair Barriers to Reach Goals: -- (Social situation) No data recorded Patient/Family Stated Goal: "I'm  hungry" Consulted and agree with results and recommendations: Patient; Physician  Pain: Pain Assessment Pain Assessment: No/denies pain Faces Pain Scale: 4 Facial Expression: 0 Body Movements: 0 Muscle Tension: 0 Compliance with ventilator (intubated pts.): N/A Vocalization (extubated pts.): 0 CPOT Total: 0 Pain Location: buttocks Pain Descriptors / Indicators: Sore; Discomfort Pain Intervention(s): Monitored during session; Repositioned End of Session: Start Time:SLP Start Time (ACUTE ONLY): 0805 Stop Time: SLP Stop Time (ACUTE ONLY): 0840 Time Calculation:SLP Time Calculation (min) (ACUTE ONLY): 35 min Charges: SLP Evaluations $ SLP Speech Visit: 1 Visit SLP Evaluations $BSS Swallow: 1 Procedure $Swallowing Treatment: 1 Procedure SLP visit diagnosis: SLP Visit Diagnosis: Dysphagia, oropharyngeal phase (R13.12) Past Medical History: Past Medical History: Diagnosis Date  Acute urinary retention   Anxiety   Brain injury 1975  Complication of anesthesia 08/29/13  difficulty due to large tongue, anterior larynx and limited opening  Difficult intubation 08/31/13  Head injury 1975  "Brain Stem Contusion"  Manic depression   Pelvic fracture 09/21/13  Inferior Pubic Ramus, Left Superior  [place]  Schizo affective schizophrenia   Schizophrenia  Past Surgical History: Past Surgical History: Procedure Laterality Date  COSMETIC SURGERY Left 1975  created new ear lobe  DRESSING CHANGE UNDER ANESTHESIA Left 08/31/2013  Procedure: DRESSING CHANGE UNDER ANESTHESIA for right ankle and left thigh;  Surgeon: Budd Palmer, MD;  Location: [facility] OR;  Service: Orthopedics;  Laterality: Left;  I & D EXTREMITY Bilateral 08/30/2013  Procedure: IRRIGATION AND DEBRIDEMENT with closure Left Thigh wound, Irrigation and debridement Right Ankle ;  Surgeon: Harvie Junior, MD;  Location: [facility] OR;  Service: Orthopedics;  Laterality: Bilateral;  I & D EXTREMITY Left 09/26/2013  Procedure: LEFT IRRIGATION AND DEBRIDEMENT Robby Sermon;  Surgeon: Budd Palmer, MD;  Location: [facility] OR;  Service: Orthopedics;  Laterality: Left;  I & D EXTREMITY Left 10/02/2013  Procedure: IRRIGATION AND DEBRIDEMENT EXTREMITY;  Surgeon: Budd Palmer, MD;  Location: [facility] OR;  Service: Orthopedics;  Laterality: Left;  INCISION AND DRAINAGE OF WOUND Left 09/21/2013  Procedure: IRRIGATION AND DEBRIDEMENT SOFT TISSUE WOUND WITH LARGE WOUND VAC PLACEMENT;  Surgeon: Budd Palmer, MD;  Location: [facility] OR;  Service: Orthopedics;  Laterality: Left;  MANDIBLE FRACTURE SURGERY  1975  SACRO-ILIAC PINNING Left 08/31/2013  Procedure: LEFT SACRO-ILIAC PINNING;  Surgeon: Budd Palmer, MD;  Location: [facility] OR;  Service: Orthopedics;  Laterality: Left;  SKIN GRAFT    SKIN GRAFT Left 10/31/2013  left thigh      DR HANDY   SKIN SPLIT GRAFT Left 10/02/2013  Procedure: SKIN GRAFT SPLIT THICKNESS;  Surgeon: Budd Palmer, MD;  Location: [facility] OR;  Service: Orthopedics;  Laterality: Left;  SKIN SPLIT GRAFT Left 10/31/2013  Procedure: LEFT THIGH SKIN GRAFT SPLIT THICKNESS;  Surgeon: Budd Palmer, MD;  Location: [facility] OR;  Service: Orthopedics;  Laterality: Left; Leigh E Borum 11/13/2022, 1:04 PM  VAS [place] LOWER EXTREMITY VENOUS (DVT)  Result Date: 11/11/2022  Lower Venous DVT Study  Patient Name:  HANSFORD HIRT  Date of Exam:   11/11/2022 Medical Rec #: [id number]        Accession #:    [id number] Date of Birth: 27-Jun-1951        Patient Gender: M Patient Age:   29 years Exam Location:  [facility] Procedure:      VAS [place] LOWER EXTREMITY VENOUS (DVT) Referring Phys: Mikey College --------------------------------------------------------------------------------  Indications: Bilateral lower extremity edema.  Risk Factors: Heart failure. Limitations: Patient positioning- right lateral decubitus in setting of pressure ulcers. Comparison Study: No prior studies.  Performing Technologist: Jean Rosenthal RDMS, RVT  Examination Guidelines: A complete evaluation includes B-mode imaging, spectral Doppler, color Doppler, and power Doppler as needed of all accessible portions of each vessel. Bilateral testing is considered an integral part of a complete examination. Limited examinations for reoccurring indications may be performed as noted. The reflux portion of the exam is performed with the patient in reverse Trendelenburg.  +---------+---------------+---------+-----------+----------+--------------+ RIGHT    CompressibilityPhasicitySpontaneityPropertiesThrombus Aging +---------+---------------+---------+-----------+----------+--------------+ CFV      Full           Yes      Yes                                 +---------+---------------+---------+-----------+----------+--------------+ SFJ      Full                                                        +---------+---------------+---------+-----------+----------+--------------+ FV Prox  Full                                                        +---------+---------------+---------+-----------+----------+--------------+ FV Mid   Full                                                        +---------+---------------+---------+-----------+----------+--------------+ FV DistalFull                                                         +---------+---------------+---------+-----------+----------+--------------+ PFV      Full                                                        +---------+---------------+---------+-----------+----------+--------------+ POP      Full           Yes      Yes                                 +---------+---------------+---------+-----------+----------+--------------+ PTV      Full                                                        +---------+---------------+---------+-----------+----------+--------------+ PERO     Full                                                        +---------+---------------+---------+-----------+----------+--------------+  Gastroc  Full                                                        +---------+---------------+---------+-----------+----------+--------------+   +--------+---------------+---------+-----------+----------------+-------------+ LEFT    CompressibilityPhasicitySpontaneityProperties      Thrombus                                                                 Aging         +--------+---------------+---------+-----------+----------------+-------------+ CFV     Full           Yes      Yes                                      +--------+---------------+---------+-----------+----------------+-------------+ SFJ     Full                                                             +--------+---------------+---------+-----------+----------------+-------------+ FV Prox Full                                                             +--------+---------------+---------+-----------+----------------+-------------+ FV Mid  Full                               Posterior                                                                approach                      +--------+---------------+---------+-----------+----------------+-------------+ FV      Full                                Posterior                     Distal                                     approach                      +--------+---------------+---------+-----------+----------------+-------------+ PFV     Full                                                             +--------+---------------+---------+-----------+----------------+-------------+  POP     Full           Yes      Yes                                      +--------+---------------+---------+-----------+----------------+-------------+ PTV     Full                                                             +--------+---------------+---------+-----------+----------------+-------------+ PERO    Full                                                             +--------+---------------+---------+-----------+----------------+-------------+     Summary: RIGHT: - There is no evidence of deep vein thrombosis in the lower extremity.  - No cystic structure found in the popliteal fossa.  LEFT: - There is no evidence of deep vein thrombosis in the lower extremity. However, portions of this examination were limited- see technologist comments above.  - No cystic structure found in the popliteal fossa.  *See table(s) above for measurements and observations. Electronically signed by Coral Else MD on 11/11/2022 at 6:35:51 PM.    Final    DG Abd 1 View  Result Date: 11/11/2022 CLINICAL DATA:  OG tube placement EXAM: ABDOMEN - 1 VIEW COMPARISON:  None Available. FINDINGS: See same day chest radiograph for thoracic findings. Weighted enteric tube courses below diaphragm and likely terminates in the proximal duodenum (D1). Nonobstructive bowel gas pattern. No acute osseous abnormality. IMPRESSION: Weighted enteric tube likely terminates in the proximal duodenum (D1). Electronically Signed   By: Lorenza Cambridge M.D.   On: 11/11/2022 15:58   DG Chest Port 1 View  Result Date: 11/11/2022 CLINICAL DATA:  Shortness of breath EXAM: PORTABLE CHEST 1  VIEW COMPARISON:  CXR 11/10/22 FINDINGS: No pleural effusion. No pneumothorax. Normal cardiac and mediastinal contours. There is interval increase in patchy airspace opacities in the left mid and lower lung fields, as well as the right lower lobe. No radiographically apparent displaced rib fractures. Visualized upper abdomen is unremarkable. Redemonstrated is a chronic appearing left proximal humeral fracture. IMPRESSION: Interval increase in patchy airspace opacities in the left mid and lower lung fields, as well as the right lower lobe. Findings are worrisome for infection. Electronically Signed   By: Lorenza Cambridge M.D.   On: 11/11/2022 07:45   DG Pelvis 1-2 Views  Result Date: 11/10/2022 CLINICAL DATA:  Cellulitis EXAM: PELVIS - 1-2 VIEW COMPARISON:  Pelvis x-ray 08/31/2013 FINDINGS: There are healed left-sided pubic rami fractures. Left sacroiliac screws are again seen. There is no acute fracture, dislocation or cortical erosion. Joint spaces are maintained. IMPRESSION: 1. No acute fracture or dislocation. 2. Healed left-sided pubic rami fractures. Electronically Signed   By: Darliss Cheney M.D.   On: 11/10/2022 19:01   CT Cervical Spine Wo Contrast  Result Date: 11/10/2022 CLINICAL DATA:  Neck trauma EXAM: CT HEAD WITHOUT CONTRAST CT CERVICAL SPINE WITHOUT CONTRAST TECHNIQUE: Multidetector CT imaging of the head and cervical spine  was performed following the standard protocol without intravenous contrast. Multiplanar CT image reconstructions of the cervical spine were also generated. RADIATION DOSE REDUCTION: This exam was performed according to the departmental dose-optimization program which includes automated exposure control, adjustment of the mA and/or kV according to patient size and/or use of iterative reconstruction technique. COMPARISON:  CT Head and C Spine 11/05/22 FINDINGS: CT HEAD FINDINGS Brain: No evidence of acute infarction, hemorrhage, hydrocephalus, extra-axial collection or mass lesion/mass  effect. Vascular: No hyperdense vessel or unexpected calcification. Skull: Mild soft tissue swelling along the midline parietal scalp. Negative for fracture or focal lesion. Sinuses/Orbits: No middle ear or mastoid effusion. Impacted cerumen in bilateral EACs. Frothy secretions in the bilateral maxillary sinuses with an air-fluid level in the right. This is new/increased compared to 04/04 24. There is a likely chronic fracture of the lateral wall of the left maxillary sinus. Other: None. CT CERVICAL SPINE FINDINGS Alignment: Trace anterolisthesis of C5 on C6 and C6 on C7. Skull base and vertebrae: No acute fracture. No primary bone lesion or focal pathologic process. Degenerative endplate changes in the lower cervical spine. Soft tissues and spinal canal: No prevertebral fluid or swelling. No visible canal hematoma. Disc levels:  No evidence of high-grade spinal canal stenosis. Upper chest: Negative. Other: None IMPRESSION: CT HEAD: 1. No acute intracranial abnormality. 2. Mild soft tissue swelling along the midline parietal scalp. No underlying calvarial fracture. 3. Frothy secretions in the bilateral maxillary sinuses with an air-fluid level in the right maxillary sinus. This is new/increased compared to 04/04 24. Correlate for acute sinusitis. CT CERVICAL SPINE: No acute fracture or traumatic listhesis. Electronically Signed   By: Lorenza Cambridge M.D.   On: 11/10/2022 15:09   CT Head Wo Contrast  Result Date: 11/10/2022 CLINICAL DATA:  Neck trauma EXAM: CT HEAD WITHOUT CONTRAST CT CERVICAL SPINE WITHOUT CONTRAST TECHNIQUE: Multidetector CT imaging of the head and cervical spine was performed following the standard protocol without intravenous contrast. Multiplanar CT image reconstructions of the cervical spine were also generated. RADIATION DOSE REDUCTION: This exam was performed according to the departmental dose-optimization program which includes automated exposure control, adjustment of the mA and/or kV  according to patient size and/or use of iterative reconstruction technique. COMPARISON:  CT Head and C Spine 11/05/22 FINDINGS: CT HEAD FINDINGS Brain: No evidence of acute infarction, hemorrhage, hydrocephalus, extra-axial collection or mass lesion/mass effect. Vascular: No hyperdense vessel or unexpected calcification. Skull: Mild soft tissue swelling along the midline parietal scalp. Negative for fracture or focal lesion. Sinuses/Orbits: No middle ear or mastoid effusion. Impacted cerumen in bilateral EACs. Frothy secretions in the bilateral maxillary sinuses with an air-fluid level in the right. This is new/increased compared to 04/04 24. There is a likely chronic fracture of the lateral wall of the left maxillary sinus. Other: None. CT CERVICAL SPINE FINDINGS Alignment: Trace anterolisthesis of C5 on C6 and C6 on C7. Skull base and vertebrae: No acute fracture. No primary bone lesion or focal pathologic process. Degenerative endplate changes in the lower cervical spine. Soft tissues and spinal canal: No prevertebral fluid or swelling. No visible canal hematoma. Disc levels:  No evidence of high-grade spinal canal stenosis. Upper chest: Negative. Other: None IMPRESSION: CT HEAD: 1. No acute intracranial abnormality. 2. Mild soft tissue swelling along the midline parietal scalp. No underlying calvarial fracture. 3. Frothy secretions in the bilateral maxillary sinuses with an air-fluid level in the right maxillary sinus. This is new/increased compared to 04/04 24. Correlate for acute sinusitis.  CT CERVICAL SPINE: No acute fracture or traumatic listhesis. Electronically Signed   By: Lorenza Cambridge M.D.   On: 11/10/2022 15:09   DG Chest Portable 1 View  Result Date: 11/10/2022 CLINICAL DATA:  Fall. EXAM: PORTABLE CHEST 1 VIEW COMPARISON:  November 05, 2022. FINDINGS: The heart size and mediastinal contours are within normal limits. Both lungs are clear. Old proximal left humeral fracture is noted. IMPRESSION: No active  disease. Electronically Signed   By: Lupita Raider M.D.   On: 11/10/2022 12:41   CT Shoulder Left Wo Contrast  Result Date: 11/05/2022 CLINICAL DATA:  Fall with left shoulder trauma. Remote history August 2021 surgical neck fracture left humerus. EXAM: CT OF THE LEFT SHOULDER WITHOUT CONTRAST TECHNIQUE: Multidetector CT imaging of the left shoulder was performed according to the standard protocol. RADIATION DOSE REDUCTION: This exam was performed according to the departmental dose-optimization program which includes automated exposure control, adjustment of the mA and/or kV according to patient size and/or use of iterative reconstruction technique. COMPARISON:  Left shoulder series 01/08/2021 and today. FINDINGS: Bones/Joint/Cartilage There is mild skeletal osteopenia. 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 Compared with 01/08/2021, there is increased anterior and medial displacement of the main distal fracture fragment up to 1 shaft width, with moderate posterior angulation of the distal fragment as well. The distal fragment displacement could be due to the recent trauma, but no new fracture has become apparent. There is no humeral head dislocation. Again noted is a 2.5 by 1 x 1.1 cm irregular sclerotic lesion in the proximal humeral shaft, which likely either represents an enchondroma or bone infarct and is unchanged in gross appearance. Elsewhere no primary pathologic process is seen. The [facility] joint is intact. There is narrowing at the inferior glenohumeral joint but no more than previously. There is no loss of the acromiohumeral space. Ligaments Suboptimally assessed by CT. Muscles and Tendons The rotator cuff muscles demonstrate partial atrophy, greatest in the supraspinatus where the atrophy is approximately 60%. The rotator cuff tendons are not well seen. There is probably at least a partial  supraspinatus tendon tear based on the location of the fracture. Other rotator cuff tendons are grossly intact. No muscular hematoma is seen. Soft tissues There is a fluid collection in between the proximal humeral fracture margins, measuring 5.1 cm AP, up to 3.9 cm transverse, craniocaudal axis difficult to measure but at least 6 cm. The Hounsfield density is 16.5. This is probably a liquefied hematoma. No acute hematoma is suspected.  There is no axillary adenopathy. The visualized mediastinum is free of adenopathy. Visualized portions of the left lung are clear. IMPRESSION: 1. 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 2. No new fracture is seen. 3. Osteopenia and degenerative change. 4. 5.1 x 3.9 x 6 cm fluid collection in between the proximal humeral fracture margins, probably a liquefied hematoma. 5. Partial atrophy of the rotator cuff muscles, greatest in the supraspinatus with the tendons are not well seen. There is probably at least a partial supraspinatus tendon tear based on the location of the fracture. 6. 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 Electronically Signed   By:  Almira Bar M.D.   On: 11/05/2022 03:33   CT Head Wo Contrast  Result Date: 11/05/2022 CLINICAL DATA:  Fall EXAM: CT HEAD WITHOUT CONTRAST CT CERVICAL SPINE WITHOUT CONTRAST TECHNIQUE: Multidetector CT imaging of the head and cervical spine was performed following the standard protocol without intravenous contrast. Multiplanar CT image reconstructions of the cervical spine were also generated. RADIATION DOSE REDUCTION: This exam was performed according to the departmental dose-optimization program which includes automated  exposure control, adjustment of the mA and/or kV according to patient size and/or use of iterative reconstruction technique. COMPARISON:  08/29/2013 CT head and cervical FINDINGS: CT HEAD FINDINGS Brain: No evidence of acute infarct, hemorrhage, mass, mass effect, or midline shift. No hydrocephalus or extra-axial fluid collection. Vascular: No hyperdense vessel. Skull: Negative for fracture or focal lesion. Sinuses/Orbits: Mucosal thickening in the ethmoid air cells and maxillary sinuses. No acute finding in the orbits. Small left periorbital and infraorbital hematoma. Other: The mastoid air cells are well aerated. CT CERVICAL SPINE FINDINGS Alignment: No traumatic listhesis. Trace anterolisthesis of C5 on C6 and C7 on T1 appears facet mediated. Dextrocurvature of the cervical spine, which may be positional. Skull base and vertebrae: No acute fracture. No primary bone lesion or focal pathologic process. Soft tissues and spinal canal: No prevertebral fluid or swelling. No visible canal hematoma. Disc levels: Degenerative changes in the cervical spine. No significant spinal canal stenosis. Upper chest: Negative. IMPRESSION: 1. No acute intracranial process. 2. Small left periorbital and infraorbital hematoma. 3. No acute fracture or traumatic listhesis in the cervical spine. Electronically Signed   By: Wiliam Ke M.D.   On: 11/05/2022 02:39   CT Cervical Spine Wo Contrast  Result Date: 11/05/2022 CLINICAL DATA:  Fall EXAM: CT HEAD WITHOUT CONTRAST CT CERVICAL SPINE WITHOUT CONTRAST TECHNIQUE: Multidetector CT imaging of the head and cervical spine was performed following the standard protocol without intravenous contrast. Multiplanar CT image reconstructions of the cervical spine were also generated. RADIATION DOSE REDUCTION: This exam was performed according to the departmental dose-optimization program which includes automated exposure control, adjustment of the mA and/or kV according to patient size and/or  use of iterative reconstruction technique. COMPARISON:  08/29/2013 CT head and cervical FINDINGS: CT HEAD FINDINGS Brain: No evidence of acute infarct, hemorrhage, mass, mass effect, or midline shift. No hydrocephalus or extra-axial fluid collection. Vascular: No hyperdense vessel. Skull: Negative for fracture or focal lesion. Sinuses/Orbits: Mucosal thickening in the ethmoid air cells and maxillary sinuses. No acute finding in the orbits. Small left periorbital and infraorbital hematoma. Other: The mastoid air cells are well aerated. CT CERVICAL SPINE FINDINGS Alignment: No traumatic listhesis. Trace anterolisthesis of C5 on C6 and C7 on T1 appears facet mediated. Dextrocurvature of the cervical spine, which may be positional. Skull base and vertebrae: No acute fracture. No primary bone lesion or focal pathologic process. Soft tissues and spinal canal: No prevertebral fluid or swelling. No visible canal hematoma. Disc levels: Degenerative changes in the cervical spine. No significant spinal canal stenosis. Upper chest: Negative. IMPRESSION: 1. No acute intracranial process. 2. Small left periorbital and infraorbital hematoma. 3. No acute fracture or traumatic listhesis in the cervical spine. Electronically Signed   By: Wiliam Ke M.D.   On: 11/05/2022 02:39   DG Chest 2 View  Result Date: 11/05/2022 CLINICAL DATA:  Fall, left shoulder pain EXAM: CHEST - 2 VIEW COMPARISON:  08/26/2013 FINDINGS: Lungs are clear. No pneumothorax or pleural effusion. Cardiac size within normal limits. Pulmonary vascularity is  normal. Remote nonunited fracture of the surgical neck of the left humerus noted. No acute fracture identified. IMPRESSION: 1. No active cardiopulmonary disease. Electronically Signed   By: Helyn Numbers M.D.   On: 11/05/2022 02:21   DG Shoulder Left  Result Date: 11/05/2022 CLINICAL DATA:  Fall, prior humeral fracture. EXAM: LEFT SHOULDER - 2+ VIEW COMPARISON:  01/08/2021 FINDINGS: Subacute to remote  fracture of the surgical neck of the left humerus is again identified. Imaging is limited by suboptimal positioning with all 3 images representing mild variations of a transscapular Y-view. Despite this, there is now 1 shaft with anterior displacement and override of the humeral shaft in relation to the humeral head, new since prior examination though the acuity of this is uncertain. No definite acute fracture. No dislocation. IMPRESSION: 1. Subacute to remote fracture of the surgical neck of the left humerus with 1 shaft with anterior displacement and override of the humeral shaft in relation to the humeral head. No definite acute fracture identified though imaging is limited by suboptimal positioning. If indicated, CT imaging may be more helpful for further evaluation. Electronically Signed   By: Helyn Numbers M.D.   On: 11/05/2022 02:20    Micro Results    No results found for this or any previous visit (from the past 240 hour(s)).  Today   Subjective    Ja Ohman today has no headache,no chest abdominal pain,no new weakness tingling or numbness, feels much better wants to go home today.    Objective   Blood pressure 116/72, pulse 97, temperature 98.7 F (37.1 C), temperature source Oral, resp. rate 14, height 6\' 2"  (1.88 m), weight 84.1 kg, SpO2 95 %.   Intake/Output Summary (Last 24 hours) at 11/26/2022 1041 Last data filed at 11/26/2022 0824 Gross per 24 hour  Intake 3193.17 ml  Output 1250 ml  Net 1943.17 ml    Exam  Awake Alert, No new F.N deficits,    [place].AT,PERRAL Supple Neck,   Symmetrical Chest wall movement, Good air movement bilaterally, CTAB RRR,No Gallops,   +ve B.Sounds, Abd Soft, Non tender,  No Cyanosis, Clubbing or edema, foley    Data Review   Recent Labs  Lab 11/21/22 0317  WBC 14.1*  HGB 11.4*  HCT 34.2*  PLT 358  MCV 91.0  MCH 30.3  MCHC 33.3  RDW 13.9    Recent Labs  Lab 11/20/22 0526 11/21/22 0317 11/25/22 0929  NA 134* 133* 138   K 3.7 3.5 3.7  CL 98 99 100  CO2 27 28 30   ANIONGAP 9 6 8   GLUCOSE 187* 100* 118*  BUN 20 17 15   CREATININE 0.79 0.90 0.84  TSH  --   --  1.803  CALCIUM 8.0* 7.9* 8.5*    Total Time in preparing paper work, data evaluation and todays exam - 35 minutes  Signature  -    Susa Raring M.D on 11/26/2022 at 10:41 AM   -  To page go to [email or web address]

## 2022-12-15 ENCOUNTER — Emergency Department (HOSPITAL_COMMUNITY): Payer: Medicare Other

## 2022-12-15 ENCOUNTER — Other Ambulatory Visit: Payer: Self-pay

## 2022-12-15 ENCOUNTER — Inpatient Hospital Stay (HOSPITAL_COMMUNITY)
Admission: EM | Admit: 2022-12-15 | Discharge: 2022-12-23 | DRG: 698 | Disposition: A | Discharge status: Skilled Nursing Facility | Payer: Medicare Other | Attending: Pulmonary Disease | Admitting: Pulmonary Disease

## 2022-12-15 DIAGNOSIS — N400 Enlarged prostate without lower urinary tract symptoms: Secondary | ICD-10-CM | POA: Diagnosis present

## 2022-12-15 DIAGNOSIS — Z79899 Other long term (current) drug therapy: Secondary | ICD-10-CM

## 2022-12-15 DIAGNOSIS — I5032 Chronic diastolic (congestive) heart failure: Secondary | ICD-10-CM | POA: Diagnosis present

## 2022-12-15 DIAGNOSIS — D696 Thrombocytopenia, unspecified: Secondary | ICD-10-CM | POA: Diagnosis present

## 2022-12-15 DIAGNOSIS — F319 Bipolar disorder, unspecified: Secondary | ICD-10-CM | POA: Diagnosis present

## 2022-12-15 DIAGNOSIS — A4102 Sepsis due to Methicillin resistant Staphylococcus aureus: Secondary | ICD-10-CM | POA: Diagnosis present

## 2022-12-15 DIAGNOSIS — E860 Dehydration: Secondary | ICD-10-CM | POA: Diagnosis present

## 2022-12-15 DIAGNOSIS — R739 Hyperglycemia, unspecified: Secondary | ICD-10-CM

## 2022-12-15 DIAGNOSIS — N170 Acute kidney failure with tubular necrosis: Secondary | ICD-10-CM | POA: Diagnosis not present

## 2022-12-15 DIAGNOSIS — E876 Hypokalemia: Secondary | ICD-10-CM | POA: Diagnosis present

## 2022-12-15 DIAGNOSIS — N179 Acute kidney failure, unspecified: Secondary | ICD-10-CM | POA: Diagnosis not present

## 2022-12-15 DIAGNOSIS — R6520 Severe sepsis without septic shock: Secondary | ICD-10-CM | POA: Diagnosis not present

## 2022-12-15 DIAGNOSIS — F1021 Alcohol dependence, in remission: Secondary | ICD-10-CM

## 2022-12-15 DIAGNOSIS — E782 Mixed hyperlipidemia: Secondary | ICD-10-CM | POA: Diagnosis present

## 2022-12-15 DIAGNOSIS — R6521 Severe sepsis with septic shock: Secondary | ICD-10-CM | POA: Diagnosis present

## 2022-12-15 DIAGNOSIS — B9562 Methicillin resistant Staphylococcus aureus infection as the cause of diseases classified elsewhere: Secondary | ICD-10-CM | POA: Insufficient documentation

## 2022-12-15 DIAGNOSIS — G9340 Encephalopathy, unspecified: Secondary | ICD-10-CM

## 2022-12-15 DIAGNOSIS — E871 Hypo-osmolality and hyponatremia: Secondary | ICD-10-CM | POA: Diagnosis not present

## 2022-12-15 DIAGNOSIS — G9341 Metabolic encephalopathy: Secondary | ICD-10-CM | POA: Diagnosis present

## 2022-12-15 DIAGNOSIS — Z6823 Body mass index (BMI) 23.0-23.9, adult: Secondary | ICD-10-CM

## 2022-12-15 DIAGNOSIS — F17210 Nicotine dependence, cigarettes, uncomplicated: Secondary | ICD-10-CM | POA: Diagnosis not present

## 2022-12-15 DIAGNOSIS — J9601 Acute respiratory failure with hypoxia: Secondary | ICD-10-CM | POA: Diagnosis not present

## 2022-12-15 DIAGNOSIS — F4310 Post-traumatic stress disorder, unspecified: Secondary | ICD-10-CM | POA: Diagnosis not present

## 2022-12-15 DIAGNOSIS — A419 Sepsis, unspecified organism: Secondary | ICD-10-CM

## 2022-12-15 DIAGNOSIS — T83511A Infection and inflammatory reaction due to indwelling urethral catheter, initial encounter: Secondary | ICD-10-CM | POA: Diagnosis present

## 2022-12-15 DIAGNOSIS — Z87820 Personal history of traumatic brain injury: Secondary | ICD-10-CM

## 2022-12-15 DIAGNOSIS — F259 Schizoaffective disorder, unspecified: Secondary | ICD-10-CM | POA: Diagnosis present

## 2022-12-15 DIAGNOSIS — N320 Bladder-neck obstruction: Secondary | ICD-10-CM | POA: Diagnosis present

## 2022-12-15 DIAGNOSIS — I10 Essential (primary) hypertension: Secondary | ICD-10-CM | POA: Diagnosis present

## 2022-12-15 DIAGNOSIS — Z781 Physical restraint status: Secondary | ICD-10-CM

## 2022-12-15 DIAGNOSIS — T8383XA Hemorrhage of genitourinary prosthetic devices, implants and grafts, initial encounter: Secondary | ICD-10-CM | POA: Diagnosis present

## 2022-12-15 DIAGNOSIS — D638 Anemia in other chronic diseases classified elsewhere: Secondary | ICD-10-CM | POA: Diagnosis present

## 2022-12-15 DIAGNOSIS — K7460 Unspecified cirrhosis of liver: Secondary | ICD-10-CM | POA: Diagnosis present

## 2022-12-15 DIAGNOSIS — F0393 Unspecified dementia, unspecified severity, with mood disturbance: Secondary | ICD-10-CM | POA: Diagnosis present

## 2022-12-15 DIAGNOSIS — J9602 Acute respiratory failure with hypercapnia: Secondary | ICD-10-CM | POA: Diagnosis not present

## 2022-12-15 DIAGNOSIS — R7881 Bacteremia: Secondary | ICD-10-CM

## 2022-12-15 DIAGNOSIS — I110 Hypertensive heart disease with heart failure: Secondary | ICD-10-CM | POA: Diagnosis present

## 2022-12-15 DIAGNOSIS — F419 Anxiety disorder, unspecified: Secondary | ICD-10-CM | POA: Diagnosis present

## 2022-12-15 DIAGNOSIS — R578 Other shock: Secondary | ICD-10-CM | POA: Diagnosis not present

## 2022-12-15 DIAGNOSIS — Y846 Urinary catheterization as the cause of abnormal reaction of the patient, or of later complication, without mention of misadventure at the time of the procedure: Secondary | ICD-10-CM | POA: Diagnosis present

## 2022-12-15 DIAGNOSIS — R1311 Dysphagia, oral phase: Secondary | ICD-10-CM | POA: Diagnosis present

## 2022-12-15 DIAGNOSIS — L89150 Pressure ulcer of sacral region, unstageable: Secondary | ICD-10-CM | POA: Diagnosis present

## 2022-12-15 DIAGNOSIS — F209 Schizophrenia, unspecified: Secondary | ICD-10-CM | POA: Diagnosis present

## 2022-12-15 DIAGNOSIS — R1313 Dysphagia, pharyngeal phase: Secondary | ICD-10-CM | POA: Diagnosis present

## 2022-12-15 DIAGNOSIS — N3091 Cystitis, unspecified with hematuria: Secondary | ICD-10-CM | POA: Diagnosis not present

## 2022-12-15 DIAGNOSIS — F17290 Nicotine dependence, other tobacco product, uncomplicated: Secondary | ICD-10-CM | POA: Diagnosis present

## 2022-12-15 DIAGNOSIS — E440 Moderate protein-calorie malnutrition: Secondary | ICD-10-CM | POA: Diagnosis present

## 2022-12-15 DIAGNOSIS — A4902 Methicillin resistant Staphylococcus aureus infection, unspecified site: Secondary | ICD-10-CM | POA: Diagnosis not present

## 2022-12-15 DIAGNOSIS — Z880 Allergy status to penicillin: Secondary | ICD-10-CM

## 2022-12-15 DIAGNOSIS — D649 Anemia, unspecified: Secondary | ICD-10-CM | POA: Diagnosis not present

## 2022-12-15 DIAGNOSIS — Z833 Family history of diabetes mellitus: Secondary | ICD-10-CM

## 2022-12-15 DIAGNOSIS — Z9181 History of falling: Secondary | ICD-10-CM

## 2022-12-15 LAB — CBC WITH DIFFERENTIAL/PLATELET
Abs Immature Granulocytes: 0 10*3/uL (ref 0.00–0.07)
Basophils Absolute: 0.2 10*3/uL — ABNORMAL HIGH (ref 0.0–0.1)
Basophils Relative: 1 %
Eosinophils Absolute: 0 10*3/uL (ref 0.0–0.5)
Eosinophils Relative: 0 %
HCT: 41.2 % (ref 39.0–52.0)
Hemoglobin: 13.1 g/dL (ref 13.0–17.0)
Lymphocytes Relative: 5 %
Lymphs Abs: 0.9 10*3/uL (ref 0.7–4.0)
MCH: 29.2 pg (ref 26.0–34.0)
MCHC: 31.8 g/dL (ref 30.0–36.0)
MCV: 91.8 fL (ref 80.0–100.0)
Monocytes Absolute: 0.7 10*3/uL (ref 0.1–1.0)
Monocytes Relative: 4 %
Neutro Abs: 16.2 10*3/uL — ABNORMAL HIGH (ref 1.7–7.7)
Neutrophils Relative %: 90 %
Platelets: 160 10*3/uL (ref 150–400)
RBC: 4.49 MIL/uL (ref 4.22–5.81)
RDW: 15 % (ref 11.5–15.5)
WBC: 18 10*3/uL — ABNORMAL HIGH (ref 4.0–10.5)
nRBC: 0 % (ref 0.0–0.2)
nRBC: 0 /100 WBC

## 2022-12-15 LAB — COMPREHENSIVE METABOLIC PANEL
ALT: 43 U/L (ref 0–44)
AST: 72 U/L — ABNORMAL HIGH (ref 15–41)
Albumin: 2.6 g/dL — ABNORMAL LOW (ref 3.5–5.0)
Alkaline Phosphatase: 70 U/L (ref 38–126)
Anion gap: 17 — ABNORMAL HIGH (ref 5–15)
BUN: 26 mg/dL — ABNORMAL HIGH (ref 8–23)
CO2: 21 mmol/L — ABNORMAL LOW (ref 22–32)
Calcium: 8.3 mg/dL — ABNORMAL LOW (ref 8.9–10.3)
Chloride: 96 mmol/L — ABNORMAL LOW (ref 98–111)
Creatinine, Ser: 1.77 mg/dL — ABNORMAL HIGH (ref 0.61–1.24)
GFR, Estimated: 41 mL/min — ABNORMAL LOW (ref >60)
Glucose, Bld: 168 mg/dL — ABNORMAL HIGH (ref 70–99)
Potassium: 3.8 mmol/L (ref 3.5–5.1)
Sodium: 134 mmol/L — ABNORMAL LOW (ref 135–145)
Total Bilirubin: 2 mg/dL — ABNORMAL HIGH (ref 0.3–1.2)
Total Protein: 6.6 g/dL (ref 6.5–8.1)

## 2022-12-15 LAB — URINALYSIS, ROUTINE W REFLEX MICROSCOPIC
Bilirubin Urine: NEGATIVE
Glucose, UA: NEGATIVE mg/dL
Ketones, ur: NEGATIVE mg/dL
Nitrite: NEGATIVE
Protein, ur: 100 mg/dL — AB
Specific Gravity, Urine: 1.016 (ref 1.005–1.030)
pH: 6 (ref 5.0–8.0)

## 2022-12-15 LAB — PROTIME-INR
INR: 1.1 (ref 0.8–1.2)
Prothrombin Time: 13.9 seconds (ref 11.4–15.2)

## 2022-12-15 LAB — GLUCOSE, CAPILLARY
Glucose-Capillary: 134 mg/dL — ABNORMAL HIGH (ref 70–99)
Glucose-Capillary: 103 mg/dL — ABNORMAL HIGH (ref 70–99)
Glucose-Capillary: 111 mg/dL — ABNORMAL HIGH (ref 70–99)

## 2022-12-15 LAB — LACTIC ACID, PLASMA
Lactic Acid, Venous: 4 mmol/L (ref 0.5–1.9)
Lactic Acid, Venous: 2 mmol/L (ref 0.5–1.9)
Lactic Acid, Venous: 2.3 mmol/L (ref 0.5–1.9)
Lactic Acid, Venous: 1.9 mmol/L (ref 0.5–1.9)

## 2022-12-15 LAB — APTT: aPTT: 36 seconds (ref 24–36)

## 2022-12-15 LAB — CULTURE, BLOOD (ROUTINE X 2)

## 2022-12-15 LAB — CORTISOL: Cortisol, Plasma: 34.7 ug/dL

## 2022-12-15 LAB — MRSA NEXT GEN BY PCR, NASAL: MRSA by PCR Next Gen: DETECTED — AB

## 2022-12-15 LAB — ETHANOL: Alcohol, Ethyl (B): <10 mg/dL (ref <10)

## 2022-12-15 LAB — PROCALCITONIN: Procalcitonin: 10.18 ng/mL

## 2022-12-15 LAB — TSH: TSH: 2.776 u[IU]/mL (ref 0.350–4.500)

## 2022-12-15 LAB — AMMONIA: Ammonia: 17 umol/L (ref 9–35)

## 2022-12-15 MED ORDER — MUPIROCIN 2 % EX OINT
1.0000 | TOPICAL_OINTMENT | Freq: Two times a day (BID) | CUTANEOUS | Status: AC
  Administered 2022-12-16 – 2022-12-20 (×9): 1 via NASAL
  Filled 2022-12-15 (×4): qty 22

## 2022-12-15 MED ORDER — THIAMINE HCL 100 MG/ML IJ SOLN
100.0000 mg | Freq: Every day | INTRAMUSCULAR | Status: DC
  Administered 2022-12-15: 100 mg via INTRAVENOUS
  Filled 2022-12-15: qty 2

## 2022-12-15 MED ORDER — VANCOMYCIN HCL 1500 MG/300ML IV SOLN
1500.0000 mg | Freq: Once | INTRAVENOUS | Status: AC
  Administered 2022-12-15: 1500 mg via INTRAVENOUS
  Filled 2022-12-15: qty 300

## 2022-12-15 MED ORDER — MIDODRINE HCL 5 MG PO TABS
10.0000 mg | ORAL_TABLET | Freq: Three times a day (TID) | ORAL | Status: DC
  Administered 2022-12-15 (×2): 10 mg via ORAL
  Filled 2022-12-15 (×2): qty 2

## 2022-12-15 MED ORDER — IOHEXOL 350 MG/ML SOLN
60.0000 mL | Freq: Once | INTRAVENOUS | Status: AC | PRN
  Administered 2022-12-15: 60 mL via INTRAVENOUS

## 2022-12-15 MED ORDER — POLYETHYLENE GLYCOL 3350 17 G PO PACK: 17.0000 g | PACK | Freq: Every day | ORAL | Status: DC | PRN

## 2022-12-15 MED ORDER — ACETAMINOPHEN 650 MG RE SUPP
650.0000 mg | Freq: Four times a day (QID) | RECTAL | Status: DC | PRN
  Administered 2022-12-16: 650 mg via RECTAL
  Filled 2022-12-15: qty 1

## 2022-12-15 MED ORDER — ACETAMINOPHEN 500 MG PO TABS
1000.0000 mg | ORAL_TABLET | Freq: Once | ORAL | Status: AC
  Administered 2022-12-15: 1000 mg via ORAL
  Filled 2022-12-15: qty 2

## 2022-12-15 MED ORDER — SODIUM CHLORIDE 0.9 % IV SOLN
250.0000 mL | INTRAVENOUS | Status: DC
  Administered 2022-12-15 – 2022-12-19 (×2): 250 mL via INTRAVENOUS

## 2022-12-15 MED ORDER — CHLORHEXIDINE GLUCONATE CLOTH 2 % EX PADS
6.0000 | MEDICATED_PAD | Freq: Every day | CUTANEOUS | Status: AC
  Administered 2022-12-16 – 2022-12-20 (×5): 6 via TOPICAL

## 2022-12-15 MED ORDER — DOCUSATE SODIUM 100 MG PO CAPS: 100.0000 mg | ORAL_CAPSULE | Freq: Two times a day (BID) | ORAL | Status: DC | PRN

## 2022-12-15 MED ORDER — HEPARIN SODIUM (PORCINE) 5000 UNIT/ML IJ SOLN
5000.0000 [IU] | Freq: Three times a day (TID) | INTRAMUSCULAR | Status: DC
  Administered 2022-12-15 – 2022-12-16 (×4): 5000 [IU] via SUBCUTANEOUS
  Filled 2022-12-15 (×4): qty 1

## 2022-12-15 MED ORDER — LACTATED RINGERS IV SOLN: INTRAVENOUS | Status: DC

## 2022-12-15 MED ORDER — SODIUM CHLORIDE 0.9 % IV SOLN: 250.0000 mL | INTRAVENOUS | Status: DC

## 2022-12-15 MED ORDER — VANCOMYCIN HCL IN DEXTROSE 1-5 GM/200ML-% IV SOLN: 1000.0000 mg | Freq: Once | INTRAVENOUS | Status: DC

## 2022-12-15 MED ORDER — LACTATED RINGERS IV SOLN: INTRAVENOUS | Status: AC

## 2022-12-15 MED ORDER — ADULT MULTIVITAMIN W/MINERALS CH
1.0000 | ORAL_TABLET | Freq: Every day | ORAL | Status: DC
  Administered 2022-12-15 – 2022-12-18 (×4): 1 via ORAL
  Filled 2022-12-15 (×4): qty 1

## 2022-12-15 MED ORDER — ONDANSETRON HCL 4 MG/2ML IJ SOLN: 4.0000 mg | Freq: Four times a day (QID) | INTRAMUSCULAR | Status: DC | PRN

## 2022-12-15 MED ORDER — ACETAMINOPHEN 325 MG PO TABS
650.0000 mg | ORAL_TABLET | Freq: Four times a day (QID) | ORAL | Status: DC | PRN
  Administered 2022-12-15 – 2022-12-18 (×5): 650 mg via ORAL
  Filled 2022-12-15 (×5): qty 2

## 2022-12-15 MED ORDER — SODIUM CHLORIDE 0.9 % IV SOLN
2.0000 g | Freq: Once | INTRAVENOUS | Status: AC
  Administered 2022-12-15: 2 g via INTRAVENOUS
  Filled 2022-12-15: qty 12.5

## 2022-12-15 MED ORDER — LACTATED RINGERS IV BOLUS (SEPSIS)
1000.0000 mL | Freq: Once | INTRAVENOUS | Status: AC
  Administered 2022-12-15: 1000 mL via INTRAVENOUS

## 2022-12-15 MED ORDER — VANCOMYCIN HCL 1250 MG/250ML IV SOLN
1250.0000 mg | INTRAVENOUS | Status: DC
  Filled 2022-12-15: qty 250

## 2022-12-15 MED ORDER — ALBUTEROL SULFATE (2.5 MG/3ML) 0.083% IN NEBU: 2.5000 mg | INHALATION_SOLUTION | RESPIRATORY_TRACT | Status: DC | PRN

## 2022-12-15 MED ORDER — SODIUM CHLORIDE 0.9% FLUSH: 10.0000 mL | INTRAVENOUS | Status: DC | PRN

## 2022-12-15 MED ORDER — METRONIDAZOLE 500 MG/100ML IV SOLN
500.0000 mg | Freq: Once | INTRAVENOUS | Status: AC
  Administered 2022-12-15: 500 mg via INTRAVENOUS
  Filled 2022-12-15: qty 100

## 2022-12-15 MED ORDER — SODIUM CHLORIDE 0.9% FLUSH
10.0000 mL | Freq: Two times a day (BID) | INTRAVENOUS | Status: DC
  Administered 2022-12-15 – 2022-12-23 (×15): 10 mL

## 2022-12-15 MED ORDER — ONDANSETRON HCL 4 MG PO TABS: 4.0000 mg | ORAL_TABLET | Freq: Four times a day (QID) | ORAL | Status: DC | PRN

## 2022-12-15 MED ORDER — PHENYLEPHRINE HCL-NACL 20-0.9 MG/250ML-% IV SOLN: 25.0000 ug/min | INTRAVENOUS | Status: DC

## 2022-12-15 MED ORDER — SODIUM CHLORIDE 0.9 % IV SOLN
2.0000 g | Freq: Two times a day (BID) | INTRAVENOUS | Status: DC
  Administered 2022-12-15 – 2022-12-16 (×2): 2 g via INTRAVENOUS
  Filled 2022-12-15 (×2): qty 12.5

## 2022-12-15 MED ORDER — FOLIC ACID 5 MG/ML IJ SOLN
1.0000 mg | Freq: Every day | INTRAMUSCULAR | Status: DC
  Administered 2022-12-15 – 2022-12-16 (×2): 1 mg via INTRAVENOUS
  Filled 2022-12-15 (×2): qty 0.2

## 2022-12-15 MED ORDER — LACTATED RINGERS IV BOLUS
500.0000 mL | Freq: Once | INTRAVENOUS | Status: AC
  Administered 2022-12-15: 500 mL via INTRAVENOUS

## 2022-12-15 MED ORDER — ENOXAPARIN SODIUM 40 MG/0.4ML IJ SOSY: 40.0000 mg | PREFILLED_SYRINGE | INTRAMUSCULAR | Status: DC

## 2022-12-15 MED ORDER — LACTATED RINGERS IV BOLUS
1000.0000 mL | Freq: Once | INTRAVENOUS | Status: AC
  Administered 2022-12-15: 1000 mL via INTRAVENOUS

## 2022-12-15 MED ORDER — NOREPINEPHRINE 4 MG/250ML-% IV SOLN
0.0000 ug/min | INTRAVENOUS | Status: DC
  Administered 2022-12-15 (×2): 2 ug/min via INTRAVENOUS
  Administered 2022-12-16 (×2): 20 ug/min via INTRAVENOUS
  Administered 2022-12-16: 16 ug/min via INTRAVENOUS
  Filled 2022-12-15 (×5): qty 250

## 2022-12-15 NOTE — Sepsis Progress Note (Signed)
Notified provider and bedside nurse of need to order and administer fluid bolus, pt needs 2520 cc.

## 2022-12-15 NOTE — ED Notes (Signed)
ED TO INPATIENT HANDOFF REPORT  ED Nurse Name and Phone #: Sheilah Mins [phone] Dot Lanes, Paramedic  S Name/Age/Gender [name] 72 y.o. male Room/Bed: TRAAC/TRAAC  Code Status   Code Status: Prior  Home/SNF/Other Nursing Home Patient oriented to: self, place, time, and situation Is this baseline? Yes   Triage Complete: Triage complete  Chief Complaint Sepsis [facility]) [A41.9]  Triage Note From [facility]. Sent here for AMS and weakness. Fall a few days ago. Chronic foley in place.   Allergies Allergies  Allergen Reactions   Penicillins Other (See Comments)    Unsure of reaction, had allergic reaction as a child and has not taken since then    Level of Care/Admitting Diagnosis ED Disposition     ED Disposition  Admit   Condition  --   Comment  Hospital Area: [facility] [[id number]]  Level of Care: Progressive [102]  Admit to Progressive based on following criteria: MULTISYSTEM THREATS such as stable sepsis, metabolic/electrolyte imbalance with or without encephalopathy that is responding to early treatment.  May admit patient to [facility] or [facility] if equivalent level of care is available:: No  Covid Evaluation: Asymptomatic - no recent exposure (last 10 days) testing not required  Diagnosis: Sepsis [facility]) [[id number]]  Admitting Physician: Maretta Bees [[id number]]  Attending Physician: Maretta Bees [[id number]]  Bed request comments: prefer 5 west  Certification:: I certify this patient will need inpatient services for at least 2 midnights  Estimated Length of Stay: 4          B Medical/Surgery History Past Medical History:  Diagnosis Date   Acute urinary retention    Anxiety    Brain injury (HCC) 1975   Complication of anesthesia 08/29/13   difficulty due to large tongue, anterior larynx and limited opening   Difficult intubation 08/31/13   Head injury 1975   "Brain Stem Contusion"   Manic depression (HCC)    Pelvic fracture  (HCC) 09/21/13   Inferior Pubic Ramus, Left Superior Ramus   Schizo affective schizophrenia (HCC)    Schizophrenia (HCC)    Past Surgical History:  Procedure Laterality Date   COSMETIC SURGERY Left 1975   created new ear lobe   DRESSING CHANGE UNDER ANESTHESIA Left 08/31/2013   Procedure: DRESSING CHANGE UNDER ANESTHESIA for right ankle and left thigh;  Surgeon: Budd Palmer, MD;  Location: [facility] OR;  Service: Orthopedics;  Laterality: Left;   I & D EXTREMITY Bilateral 08/30/2013   Procedure: IRRIGATION AND DEBRIDEMENT with closure Left Thigh wound, Irrigation and debridement Right Ankle ;  Surgeon: Harvie Junior, MD;  Location: [facility] OR;  Service: Orthopedics;  Laterality: Bilateral;   I & D EXTREMITY Left 09/26/2013   Procedure: LEFT IRRIGATION AND DEBRIDEMENT Robby Sermon;  Surgeon: Budd Palmer, MD;  Location: [facility] OR;  Service: Orthopedics;  Laterality: Left;   I & D EXTREMITY Left 10/02/2013   Procedure: IRRIGATION AND DEBRIDEMENT EXTREMITY;  Surgeon: Budd Palmer, MD;  Location: [facility] OR;  Service: Orthopedics;  Laterality: Left;   INCISION AND DRAINAGE OF WOUND Left 09/21/2013   Procedure: IRRIGATION AND DEBRIDEMENT SOFT TISSUE WOUND WITH LARGE WOUND VAC PLACEMENT;  Surgeon: Budd Palmer, MD;  Location: [facility] OR;  Service: Orthopedics;  Laterality: Left;   MANDIBLE FRACTURE SURGERY  1975   SACRO-ILIAC PINNING Left 08/31/2013   Procedure: LEFT SACRO-ILIAC PINNING;  Surgeon: Budd Palmer, MD;  Location: [facility] OR;  Service: Orthopedics;  Laterality: Left;   SKIN GRAFT  SKIN GRAFT Left 10/31/2013   left thigh      DR HANDY    SKIN SPLIT GRAFT Left 10/02/2013   Procedure: SKIN GRAFT SPLIT THICKNESS;  Surgeon: Budd Palmer, MD;  Location: [facility] OR;  Service: Orthopedics;  Laterality: Left;   SKIN SPLIT GRAFT Left 10/31/2013   Procedure: LEFT THIGH SKIN GRAFT SPLIT THICKNESS;  Surgeon: Budd Palmer, MD;  Location: [facility] OR;  Service: Orthopedics;  Laterality: Left;     A IV  Location/Drains/Wounds Patient Lines/Drains/Airways Status     Active Line/Drains/Airways     Name Placement date Placement time Site Days   Peripheral IV 12/15/22 18 G Left Antecubital 12/15/22  1211  Antecubital  less than 1   Peripheral IV 12/15/22 20 G Anterior;Left Forearm 12/15/22  1212  Forearm  less than 1   Urethral Catheter Katrina,RN Double-lumen 16 Fr. 12/15/22  1234  Double-lumen  less than 1   Wound / Incision (Open or Dehisced) 11/10/22 Skin tear Knee Anterior;Left Big scab 11/10/22  2200  Knee  35   Wound / Incision (Open or Dehisced) 11/10/22 Skin tear Knee Anterior;Right Big scab 11/10/22  2200  Knee  35   Wound / Incision (Open or Dehisced) 11/10/22 Other (Comment) Buttocks Left Pressure wound 11/10/22  2200  Buttocks  35   Wound / Incision (Open or Dehisced) 11/10/22 Other (Comment) Buttocks Right Pressure Wound 11/10/22  2200  Buttocks  35            Intake/Output Last 24 hours  Intake/Output Summary (Last 24 hours) at 12/15/2022 1611 Last data filed at 12/15/2022 1558 Gross per 24 hour  Intake 1400 ml  Output --  Net 1400 ml    Labs/Imaging Results for orders placed or performed during the hospital encounter of 12/15/22 (from the past 48 hour(s))  Comprehensive metabolic panel     Status: Abnormal   Collection Time: 12/15/22 11:54 AM  Result Value Ref Range   Sodium 134 (L) 135 - 145 mmol/L   Potassium 3.8 3.5 - 5.1 mmol/L   Chloride 96 (L) 98 - 111 mmol/L   CO2 21 (L) 22 - 32 mmol/L   Glucose, Bld 168 (H) 70 - 99 mg/dL    Comment: Glucose reference range applies only to samples taken after fasting for at least 8 hours.   BUN 26 (H) 8 - 23 mg/dL   Creatinine, Ser 8.41 (H) 0.61 - 1.24 mg/dL   Calcium 8.3 (L) 8.9 - 10.3 mg/dL   Total Protein 6.6 6.5 - 8.1 g/dL   Albumin 2.6 (L) 3.5 - 5.0 g/dL   AST 72 (H) 15 - 41 U/L   ALT 43 0 - 44 U/L   Alkaline Phosphatase 70 38 - 126 U/L   Total Bilirubin 2.0 (H) 0.3 - 1.2 mg/dL   GFR, Estimated 41 (L) >60  mL/min    Comment: (NOTE) Calculated using the CKD-EPI Creatinine Equation (2021)    Anion gap 17 (H) 5 - 15    Comment: Performed at [facility], [address], [place], [place] [address]  CBC with Differential     Status: Abnormal   Collection Time: 12/15/22 11:54 AM  Result Value Ref Range   WBC 18.0 (H) 4.0 - 10.5 K/uL   RBC 4.49 4.22 - 5.81 MIL/uL   Hemoglobin 13.1 13.0 - 17.0 g/dL   HCT 10.2 72.5 - 36.6 %   MCV 91.8 80.0 - 100.0 fL   MCH 29.2 26.0 - 34.0 pg  MCHC 31.8 30.0 - 36.0 g/dL   RDW 16.1 09.6 - 04.5 %   Platelets 160 150 - 400 K/uL   nRBC 0.0 0.0 - 0.2 %   Neutrophils Relative % 90 %   Neutro Abs 16.2 (H) 1.7 - 7.7 K/uL   Lymphocytes Relative 5 %   Lymphs Abs 0.9 0.7 - 4.0 K/uL   Monocytes Relative 4 %   Monocytes Absolute 0.7 0.1 - 1.0 K/uL   Eosinophils Relative 0 %   Eosinophils Absolute 0.0 0.0 - 0.5 K/uL   Basophils Relative 1 %   Basophils Absolute 0.2 (H) 0.0 - 0.1 K/uL   WBC Morphology See Note     Comment: Vaculated Neutrophils  Increased Bands. >20% Bands    nRBC 0 0 /100 WBC   Abs Immature Granulocytes 0.00 0.00 - 0.07 K/uL    Comment: Performed at [facility], [address], [place], [place] [address]  Protime-INR     Status: None   Collection Time: 12/15/22 11:54 AM  Result Value Ref Range   Prothrombin Time 13.9 11.4 - 15.2 seconds   INR 1.1 0.8 - 1.2    Comment: (NOTE) INR goal varies based on device and disease states. Performed at [facility], [address], [place], [place] [address]   APTT     Status: None   Collection Time: 12/15/22 11:54 AM  Result Value Ref Range   aPTT 36 24 - 36 seconds    Comment: Performed at [facility], [address] [place], [place] [address]  Lactic acid, plasma     Status: Abnormal   Collection Time: 12/15/22 12:17 PM  Result Value Ref Range   Lactic Acid, Venous 4.0 (HH) 0.5 - 1.9 mmol/L    Comment: CRITICAL RESULT CALLED TO, READ BACK BY AND VERIFIED WITH K.Kelcey Wickstrom  EMTP @1437  05.14.2024 E.AHMED Performed at [facility], [address] [place], [place] [address]   Urinalysis, Routine w reflex microscopic -Urine, Catheterized; Indwelling urinary catheter     Status: Abnormal   Collection Time: 12/15/22 12:40 PM  Result Value Ref Range   Color, Urine AMBER (A) YELLOW    Comment: BIOCHEMICALS MAY BE AFFECTED BY COLOR   APPearance CLOUDY (A) CLEAR   Specific Gravity, Urine 1.016 1.005 - 1.030   pH 6.0 5.0 - 8.0   Glucose, UA NEGATIVE NEGATIVE mg/dL   Hgb urine dipstick MODERATE (A) NEGATIVE   Bilirubin Urine NEGATIVE NEGATIVE   Ketones, ur NEGATIVE NEGATIVE mg/dL   Protein, ur 657 (A) NEGATIVE mg/dL   Nitrite NEGATIVE NEGATIVE   Leukocytes,Ua LARGE (A) NEGATIVE   RBC / HPF 0-5 0 - 5 RBC/hpf   WBC, UA 11-20 0 - 5 WBC/hpf   Bacteria, UA RARE (A) NONE SEEN   Squamous Epithelial / HPF 0-5 0 - 5 /HPF   Mucus PRESENT     Comment: Performed at [facility], [address], [place], [place] [address]  Lactic acid, plasma     Status: Abnormal   Collection Time: 12/15/22  2:44 PM  Result Value Ref Range   Lactic Acid, Venous 2.0 (HH) 0.5 - 1.9 mmol/L    Comment: CRITICAL VALUE NOTED. VALUE IS CONSISTENT WITH PREVIOUSLY REPORTED/CALLED VALUE Performed at [facility], [address] [place], [place] [address]    CT ABDOMEN PELVIS W CONTRAST  Result Date: 12/15/2022 CLINICAL DATA:  Right lower quadrant abdominal pain. EXAM: CT ABDOMEN AND PELVIS WITH CONTRAST  TECHNIQUE: Multidetector CT imaging of the abdomen and pelvis was performed using the standard protocol following bolus administration of intravenous contrast. RADIATION DOSE REDUCTION: This exam was performed according to the departmental dose-optimization program which includes automated exposure control, adjustment of the mA and/or kV according to patient size and/or use of iterative reconstruction technique. CONTRAST:  60mL OMNIPAQUE IOHEXOL 350 MG/ML SOLN COMPARISON:  12/18/2021.  FINDINGS: Lower chest: Dependent lower lobe opacities consistent with atelectasis. No acute findings. Hepatobiliary: Liver normal in size and overall attenuation. There are several low-attenuation liver masses consistent with cysts, largest 1.3 cm in the left lobe, stable from the prior CT. No other liver abnormalities. Material of increased attenuation noted in the gallbladder neck suggesting sludge or small stones. No wall thickening or inflammation. No bile duct dilation. Pancreas: Unremarkable. No pancreatic ductal dilatation or surrounding inflammatory changes. Spleen: Normal in size without focal abnormality. Adrenals/Urinary Tract: Normal adrenal glands. Kidneys normal in size, orientation and position with symmetric enhancement, but relatively delayed excretion. Small exophytic cyst, anterior upper pole the left kidney, 8 mm. No follow-up indicated. No other renal masses. No stones. Mild distension of the renal collecting systems bilaterally as well as both ureters. No renal or ureteral stones. Bladder mostly decompressed by a Foley catheter. Stomach/Bowel: Stomach is within normal limits. Appendix appears normal. No evidence of bowel wall thickening, distention, or inflammatory changes. Vascular/Lymphatic: Aortic atherosclerosis. No aneurysm. No enlarged lymph nodes. Reproductive: Enlarged prostate, 4.5 cm transversely. Other: No abdominal wall hernia or abnormality. No abdominopelvic ascites. Musculoskeletal: No acute fracture or acute finding. Previous ORIF, with screws inserted from the ileum into the sacrum across the left SI joint. Left ilium bone islands. No aggressive bone lesion. Old left parasymphyseal fracture. IMPRESSION: 1. No acute findings within the abdomen or pelvis. 2. Mild dilation of the intrarenal collecting systems and ureters, relatively symmetric and presumed to be due to bladder outlet obstruction. Bladder mostly decompressed by a Foley catheter. No renal or ureteral stones. Enlarged  prostate. 3. No bowel obstruction or inflammation. 4. Aortic atherosclerosis. Electronically Signed   By: Amie Portland M.D.   On: 12/15/2022 14:56   CT HEAD WO CONTRAST ( )  Result Date: 12/15/2022 CLINICAL DATA:  Mental status change, unknown cause. EXAM: CT HEAD WITHOUT CONTRAST TECHNIQUE: Contiguous axial images were obtained from the base of the skull through the vertex without intravenous contrast. RADIATION DOSE REDUCTION: This exam was performed according to the departmental dose-optimization program which includes automated exposure control, adjustment of the mA and/or kV according to patient size and/or use of iterative reconstruction technique. COMPARISON:  Head CT 11/10/2022. FINDINGS: Brain: No acute intracranial hemorrhage. Gray-white differentiation is preserved. No hydrocephalus or extra-axial collection. No mass effect or midline shift. Vascular: No hyperdense vessel or unexpected calcification. Skull: No calvarial fracture or suspicious bone lesion. Skull base is unremarkable. Sinuses/Orbits: Unremarkable. Other: None. IMPRESSION: No acute intracranial abnormality. Electronically Signed   By: Orvan Falconer M.D.   On: 12/15/2022 14:54   DG Chest Port 1 View  Result Date: 12/15/2022 CLINICAL DATA:  Questionable sepsis, evaluate for abnormality. EXAM: PORTABLE CHEST 1 VIEW COMPARISON:  11/11/2022. FINDINGS: Low lung volumes accentuate the pulmonary vasculature and cardiomediastinal silhouette. No consolidation or pulmonary edema. No pleural effusion or pneumothorax. IMPRESSION: No evidence of acute cardiopulmonary disease. Electronically Signed   By: Orvan Falconer M.D.   On: 12/15/2022 12:52    Pending Labs [employer] (From admission, onward)     Start     Ordered  12/15/22 1154  Blood Culture (routine x 2)  (Undifferentiated presentation (screening labs and basic nursing orders))  BLOOD CULTURE X 2,   STAT      12/15/22 1154   12/15/22 1154  Remove and replace urinary cath  (placed > 5 days) then obtain urine culture from new indwelling urinary catheter.  (Undifferentiated presentation (screening labs and basic nursing orders))  Once,   URGENT       Question:  Indication  Answer:  Sepsis   12/15/22 1154            Vitals/Pain Today's Vitals   12/15/22 1359 12/15/22 1445 12/15/22 1450 12/15/22 1559  BP:  (!) 97/51    Pulse:  (!) 114    Resp:  (!) 30    Temp:  (!) 102.1 F (38.9 C) (!) 102 F (38.9 C) (!) 100.5 F (38.1 C)  TempSrc:   Bladder Bladder  SpO2:  95%    Weight:      Height:      PainSc: 0-No pain       Isolation Precautions No active isolations  Medications Medications  lactated ringers infusion (has no administration in time range)  lactated ringers bolus 1,000 mL (0 mLs Intravenous Stopped 12/15/22 1450)  acetaminophen (TYLENOL) tablet 1,000 mg (1,000 mg Oral Given 12/15/22 1305)  ceFEPIme (MAXIPIME) 2 g in sodium chloride 0.9 % 100 mL IVPB (0 g Intravenous Stopped 12/15/22 1331)  metroNIDAZOLE (FLAGYL) IVPB 500 mg (0 mg Intravenous Stopped 12/15/22 1359)  vancomycin (VANCOREADY) IVPB 1500 mg/300 mL (0 mg Intravenous Stopped 12/15/22 1558)  iohexol (OMNIPAQUE) 350 MG/ML injection 60 mL (60 mLs Intravenous Contrast Given 12/15/22 1433)  lactated ringers bolus 1,000 mL (1,000 mLs Intravenous New Bag/Given 12/15/22 1447)  lactated ringers bolus 500 mL (500 mLs Intravenous New Bag/Given 12/15/22 1607)    Mobility non-ambulatory     Focused Assessments Neuro Assessment Handoff:  Swallow screen pass? Yes          Neuro Assessment: Exceptions to WDL Neuro Checks:      Has TPA been given? No If patient is a Neuro Trauma and patient is going to OR before floor call report to 4N Charge nurse: [phone] or [phone]   R Recommendations: See Admitting Provider Note  Report given to:   Additional Notes:

## 2022-12-15 NOTE — Sepsis Progress Note (Signed)
Elink monitoring for the code sepsis protocol.  

## 2022-12-15 NOTE — ED Notes (Signed)
Tried to call report to ICU, no answer

## 2022-12-15 NOTE — ED Triage Notes (Signed)
From [facility]. Sent here for AMS and weakness. Fall a few days ago. Chronic foley in place.

## 2022-12-15 NOTE — Progress Notes (Signed)
Pharmacy Antibiotic Note  [name] is a 72 y.o. male admitted on 12/15/2022 with sepsis.  Pharmacy has been consulted for vancomycin and cefepime dosing. Flagyl per MD.   He is presenting today with altered mental status and an elevated white blood cell count at 18k. He is febrile with a 24 hour t-max of 104.2. He has a chronic foley in place from [place].   Plan: Start Vancomycin 1500mg  IV loading dose and then begin maintenance regimen of Vancomycin 1250 q24 hours (eAUC 493, Scr 1.77, IBW, Vd 0.72)  Start cefepime 2g q12 hours  Flagyl x 1 per ED provider    Height: 6\' 2"  (188 cm) Weight: 84 kg (185 lb 3 oz) IBW/kg (Calculated) : 82.2  Temp (24hrs), Avg:101 F (38.3 C), Min:97.8 F (36.6 C), Max:104.2 F (40.1 C)  Recent Labs  Lab 12/15/22 1154  WBC 18.0*    Estimated Creatinine Clearance: 95.1 mL/min (by C-G formula based on SCr of 0.84 mg/dL).    Allergies  Allergen Reactions   Penicillins Other (See Comments)    Unsure of reaction, had allergic reaction as a child and has not taken since then    Microbiology results: 12/15/2022 BCx: sent UCx:   Sputum:   MRSA PCR:   Thank you for allowing pharmacy to be a part of this patient's care.  Blane Ohara, PharmD  PGY1 Pharmacy Resident

## 2022-12-15 NOTE — Sepsis Progress Note (Signed)
Lab called about lactic acid result from blood collected @1212 , states they are running it now
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
## 2022-12-15 NOTE — ED Provider Notes (Signed)
[place] EMERGENCY DEPARTMENT AT [facility] Provider Note   CSN: [id number] Arrival date & time: 12/15/22  1151     History  Chief Complaint  Patient presents with   Altered Mental Status    [name] is a 72 y.o. male.  HPI   72 year old male with medical history significant for schizophrenia, urinary retention with chronic indwelling Foley catheter, presenting to the emergency department from [facility] for confusion, generalized weakness.  The patient was unable to provide much of a history of present illness.  He denies any pain or complaints.  He arrived GCS 14, ABC intact.  On arrival, the patient was found to be febrile to 104.2, tachycardic heart rate 132, mildly tachypneic RR 26.  His indwelling Foley was in place draining cloudy urine.  Code sepsis was initiated on arrival.  Home Medications Prior to Admission medications   Medication Sig Start Date End Date Taking? Authorizing Provider  acetaminophen (TYLENOL) 325 MG tablet Take 2 tablets (650 mg total) by mouth every 6 (six) hours as needed for moderate pain (headache). 11/26/22   Leroy Sea, MD  busPIRone (BUSPAR) 15 MG tablet Take 1 tablet (15 mg total) by mouth 3 (three) times daily. Patient taking differently: Take 15 mg by mouth 3 (three) times daily. Take as needed for anxiety 08/27/22   Park Pope, MD  ezetimibe (ZETIA) 10 MG tablet Take 1 tablet (10 mg total) by mouth daily. 11/26/22   Leroy Sea, MD  finasteride (PROSCAR) 5 MG tablet Take 1 tablet (5 mg total) by mouth daily. 11/27/22   Leroy Sea, MD  midodrine (PROAMATINE) 10 MG tablet Take 1 tablet (10 mg total) by mouth every 8 (eight) hours. 11/26/22   Leroy Sea, MD  OLANZapine (ZYPREXA) 20 MG tablet Take 1 tablet (20 mg total) by mouth at bedtime. 08/27/22   Park Pope, MD  rosuvastatin (CRESTOR) 40 MG tablet Take 1 tablet (40 mg total) by mouth daily. 03/17/22   Sharlene Dory, NP  thiamine (VITAMIN B-1) 100 MG  tablet Take 1 tablet (100 mg total) by mouth daily. 11/27/22   Leroy Sea, MD      Allergies    Penicillins    Review of Systems   Review of Systems  Unable to perform ROS: Mental status change    Physical Exam Updated Vital Signs BP (!) 94/52   Pulse (!) 110   Temp 100.1 F (37.8 C)   Resp (!) 24   Ht 6\' 2"  (1.88 m)   Wt 84 kg   SpO2 97%   BMI 23.78 kg/m  Physical Exam Vitals and nursing note reviewed.  Constitutional:      General: He is not in acute distress.    Appearance: He is well-developed.  HENT:     Head: Normocephalic and atraumatic.     Mouth/Throat:     Mouth: Mucous membranes are dry.  Eyes:     Conjunctiva/sclera: Conjunctivae normal.  Neck:     Comments: No meningismus Cardiovascular:     Rate and Rhythm: Regular rhythm. Tachycardia present.  Pulmonary:     Effort: Pulmonary effort is normal. No respiratory distress.     Breath sounds: Normal breath sounds.  Abdominal:     Palpations: Abdomen is soft.     Tenderness: There is no abdominal tenderness.  Musculoskeletal:        General: No swelling.     Cervical back: Neck supple.  Skin:  General: Skin is warm and dry.     Capillary Refill: Capillary refill takes less than 2 seconds.  Neurological:     General: No focal deficit present.     Mental Status: He is alert and oriented to person, place, and time. Mental status is at baseline.     Cranial Nerves: No cranial nerve deficit.     Sensory: No sensory deficit.     Motor: No weakness.  Psychiatric:        Mood and Affect: Mood normal.     ED Results / Procedures / Treatments   Labs (all labs ordered are listed, but only abnormal results are displayed) Labs Reviewed  LACTIC ACID, PLASMA - Abnormal; Notable for the following components:      Result Value   Lactic Acid, Venous 2.0 (*)    All other components within normal limits  LACTIC ACID, PLASMA - Abnormal; Notable for the following components:   Lactic Acid, Venous 4.0  (*)    All other components within normal limits  COMPREHENSIVE METABOLIC PANEL - Abnormal; Notable for the following components:   Sodium 134 (*)    Chloride 96 (*)    CO2 21 (*)    Glucose, Bld 168 (*)    BUN 26 (*)    Creatinine, Ser 1.77 (*)    Calcium 8.3 (*)    Albumin 2.6 (*)    AST 72 (*)    Total Bilirubin 2.0 (*)    GFR, Estimated 41 (*)    Anion gap 17 (*)    All other components within normal limits  CBC WITH DIFFERENTIAL/PLATELET - Abnormal; Notable for the following components:   WBC 18.0 (*)    Neutro Abs 16.2 (*)    Basophils Absolute 0.2 (*)    All other components within normal limits  URINALYSIS, ROUTINE W REFLEX MICROSCOPIC - Abnormal; Notable for the following components:   Color, Urine AMBER (*)    APPearance CLOUDY (*)    Hgb urine dipstick MODERATE (*)    Protein, ur 100 (*)    Leukocytes,Ua LARGE (*)    Bacteria, UA RARE (*)    All other components within normal limits  CULTURE, BLOOD (ROUTINE X 2)  CULTURE, BLOOD (ROUTINE X 2)  URINE CULTURE  PROTIME-INR  APTT  CORTISOL  PROCALCITONIN  LACTIC ACID, PLASMA  LACTIC ACID, PLASMA  ETHANOL  RAPID URINE DRUG SCREEN, HOSP PERFORMED  AMMONIA  TSH  CBC  COMPREHENSIVE METABOLIC PANEL    EKG EKG Interpretation  Date/Time:  Tuesday Dec 15 2022 11:54:06 EDT Ventricular Rate:  132 PR Interval:  133 QRS Duration: 92 QT Interval:  331 QTC Calculation: 491 R Axis:   74 Text Interpretation: Sinus tachycardia Ventricular premature complex Aberrant complex Minimal ST depression Borderline prolonged QT interval Confirmed by Ernie Avena ([id number]) on 12/15/2022 1:02:22 PM  Radiology CT ABDOMEN PELVIS W CONTRAST  Result Date: 12/15/2022 CLINICAL DATA:  Right lower quadrant abdominal pain. EXAM: CT ABDOMEN AND PELVIS WITH CONTRAST TECHNIQUE: Multidetector CT imaging of the abdomen and pelvis was performed using the standard protocol following bolus administration of intravenous contrast. RADIATION DOSE  REDUCTION: This exam was performed according to the departmental dose-optimization program which includes automated exposure control, adjustment of the mA and/or kV according to patient size and/or use of iterative reconstruction technique. CONTRAST:  60mL OMNIPAQUE IOHEXOL 350 MG/ML SOLN COMPARISON:  12/18/2021. FINDINGS: Lower chest: Dependent lower lobe opacities consistent with atelectasis. No acute findings. Hepatobiliary: Liver normal in size and  overall attenuation. There are several low-attenuation liver masses consistent with cysts, largest 1.3 cm in the left lobe, stable from the prior CT. No other liver abnormalities. Material of increased attenuation noted in the gallbladder neck suggesting sludge or small stones. No wall thickening or inflammation. No bile duct dilation. Pancreas: Unremarkable. No pancreatic ductal dilatation or surrounding inflammatory changes. Spleen: Normal in size without focal abnormality. Adrenals/Urinary Tract: Normal adrenal glands. Kidneys normal in size, orientation and position with symmetric enhancement, but relatively delayed excretion. Small exophytic cyst, anterior upper pole the left kidney, 8 mm. No follow-up indicated. No other renal masses. No stones. Mild distension of the renal collecting systems bilaterally as well as both ureters. No renal or ureteral stones. Bladder mostly decompressed by a Foley catheter. Stomach/Bowel: Stomach is within normal limits. Appendix appears normal. No evidence of bowel wall thickening, distention, or inflammatory changes. Vascular/Lymphatic: Aortic atherosclerosis. No aneurysm. No enlarged lymph nodes. Reproductive: Enlarged prostate, 4.5 cm transversely. Other: No abdominal wall hernia or abnormality. No abdominopelvic ascites. Musculoskeletal: No acute fracture or acute finding. Previous ORIF, with screws inserted from the ileum into the sacrum across the left SI joint. Left ilium bone islands. No aggressive bone lesion. Old left  parasymphyseal fracture. IMPRESSION: 1. No acute findings within the abdomen or pelvis. 2. Mild dilation of the intrarenal collecting systems and ureters, relatively symmetric and presumed to be due to bladder outlet obstruction. Bladder mostly decompressed by a Foley catheter. No renal or ureteral stones. Enlarged prostate. 3. No bowel obstruction or inflammation. 4. Aortic atherosclerosis. Electronically Signed   By: Amie Portland M.D.   On: 12/15/2022 14:56   CT HEAD WO CONTRAST ( )  Result Date: 12/15/2022 CLINICAL DATA:  Mental status change, unknown cause. EXAM: CT HEAD WITHOUT CONTRAST TECHNIQUE: Contiguous axial images were obtained from the base of the skull through the vertex without intravenous contrast. RADIATION DOSE REDUCTION: This exam was performed according to the departmental dose-optimization program which includes automated exposure control, adjustment of the mA and/or kV according to patient size and/or use of iterative reconstruction technique. COMPARISON:  Head CT 11/10/2022. FINDINGS: Brain: No acute intracranial hemorrhage. Gray-white differentiation is preserved. No hydrocephalus or extra-axial collection. No mass effect or midline shift. Vascular: No hyperdense vessel or unexpected calcification. Skull: No calvarial fracture or suspicious bone lesion. Skull base is unremarkable. Sinuses/Orbits: Unremarkable. Other: None. IMPRESSION: No acute intracranial abnormality. Electronically Signed   By: Orvan Falconer M.D.   On: 12/15/2022 14:54   DG Chest Port 1 View  Result Date: 12/15/2022 CLINICAL DATA:  Questionable sepsis, evaluate for abnormality. EXAM: PORTABLE CHEST 1 VIEW COMPARISON:  11/11/2022. FINDINGS: Low lung volumes accentuate the pulmonary vasculature and cardiomediastinal silhouette. No consolidation or pulmonary edema. No pleural effusion or pneumothorax. IMPRESSION: No evidence of acute cardiopulmonary disease. Electronically Signed   By: Orvan Falconer M.D.   On:  12/15/2022 12:52    Procedures .Critical Care  Performed by: Ernie Avena, MD Authorized by: Ernie Avena, MD   Critical care provider statement:    Critical care time (minutes):  30   Critical care was necessary to treat or prevent imminent or life-threatening deterioration of the following conditions:  Sepsis   Critical care was time spent personally by me on the following activities:  Development of treatment plan with patient or surrogate, discussions with consultants, evaluation of patient's response to treatment, examination of patient, ordering and review of laboratory studies, ordering and review of radiographic studies, ordering and performing treatments and interventions, pulse oximetry,  re-evaluation of patient's condition and review of old charts   Care discussed with: admitting provider       Medications Ordered in ED Medications  lactated ringers infusion ( Intravenous New Bag/Given 12/15/22 1644)  vancomycin (VANCOREADY) IVPB 1250 mg/250 mL (has no administration in time range)  ceFEPIme (MAXIPIME) 2 g in sodium chloride 0.9 % 100 mL IVPB (has no administration in time range)  midodrine (PROAMATINE) tablet 10 mg (has no administration in time range)  acetaminophen (TYLENOL) tablet 650 mg (has no administration in time range)    Or  acetaminophen (TYLENOL) suppository 650 mg (has no administration in time range)  ondansetron (ZOFRAN) tablet 4 mg (has no administration in time range)    Or  ondansetron (ZOFRAN) injection 4 mg (has no administration in time range)  albuterol (PROVENTIL) (2.5 MG/3ML) 0.083% nebulizer solution 2.5 mg (has no administration in time range)  thiamine (VITAMIN B1) injection 100 mg (has no administration in time range)  multivitamin with minerals tablet 1 tablet (has no administration in time range)  docusate sodium (COLACE) capsule 100 mg (has no administration in time range)  polyethylene glycol (MIRALAX / GLYCOLAX) packet 17 g (has no  administration in time range)  heparin injection 5,000 Units (has no administration in time range)  0.9 %  sodium chloride infusion (has no administration in time range)  norepinephrine (LEVOPHED) 4mg  in (0.016 mg/mL) premix infusion (has no administration in time range)  folic acid injection 1 mg (has no administration in time range)  lactated ringers bolus 1,000 mL (0 mLs Intravenous Stopped 12/15/22 1450)  acetaminophen (TYLENOL) tablet 1,000 mg (1,000 mg Oral Given 12/15/22 1305)  ceFEPIme (MAXIPIME) 2 g in sodium chloride 0.9 % 100 mL IVPB (0 g Intravenous Stopped 12/15/22 1331)  metroNIDAZOLE (FLAGYL) IVPB 500 mg (0 mg Intravenous Stopped 12/15/22 1359)  vancomycin (VANCOREADY) IVPB 1500 mg/300 mL (0 mg Intravenous Stopped 12/15/22 1558)  iohexol (OMNIPAQUE) 350 MG/ML injection 60 mL (60 mLs Intravenous Contrast Given 12/15/22 1433)  lactated ringers bolus 1,000 mL (0 mLs Intravenous Stopped 12/15/22 1615)  lactated ringers bolus 500 mL (0 mLs Intravenous Stopped 12/15/22 1645)    ED Course/ Medical Decision Making/ A&P Clinical Course as of 12/15/22 1708  Tue Dec 15, 2022  1301 WBC(!): 18.0 [JL]  1301 Temp(!): 104.2 F (40.1 C) [JL]  1301 Pulse Rate(!): 127 [JL]  1317 Creatinine(!): 1.77 [JL]  1317 Leukocytes,Ua(!): LARGE [JL]  1317 WBC, UA: 11-20 [JL]  1318 Bacteria, UA(!): RARE [JL]  1440 Lactic Acid, Venous(!!): 4.0 [JL]    Clinical Course User Index [JL] Ernie Avena, MD                             Medical Decision Making Amount and/or Complexity of Data Reviewed Labs: ordered. Decision-making details documented in ED Course. Radiology: ordered. ECG/medicine tests: ordered.  Risk OTC drugs. Prescription drug management. Decision regarding hospitalization.    72 year old male with medical history significant for schizophrenia, urinary retention with chronic indwelling Foley catheter, presenting to the emergency department from [facility] for confusion,  generalized weakness.  The patient was unable to provide much of a history of present illness.  He denies any pain or complaints.  He arrived GCS 14, ABC intact.  On arrival, the patient was found to be febrile to 104.2, tachycardic heart rate 132, mildly tachypneic RR 26.  His indwelling Foley was in place draining cloudy urine.  Code sepsis was initiated  on arrival.  His exam is most notable for dry mucous membranes, sinus .  My immediate concern is for a life-threatening infection in this patient. Evaluation and treatment for sepsis began at @[facility] @.  The most likely infectious source is genitourinary vs respiratory vs intraabdominal. Pt without meningismus, currently denies headache or neck pain and is AAOx3.  Labs: Lactic acidosis to 4, repeat downtrending to 2, CBC with leukocytosis to 18, no anemia, urinalysis after Foley exchange with evidence of UTI with large leukocytes, 11-20 WBCs, rare bacteria.  CMP with evidence of an AKI with a serum creatinine of 1.77 baseline of 0.8.  Imaging: CXR: IMPRESSION:  No evidence of acute cardiopulmonary disease.   CT Head: IMPRESSION:  No acute intracranial abnormality.    CT Abdomen Pelvis: IMPRESSION:  1. No acute findings within the abdomen or pelvis.  2. Mild dilation of the intrarenal collecting systems and ureters,  relatively symmetric and presumed to be due to bladder outlet  obstruction. Bladder mostly decompressed by a Foley catheter. No  renal or ureteral stones. Enlarged prostate.  3. No bowel obstruction or inflammation.  4. Aortic atherosclerosis.     For treatment, the patient was given 2.5L LR for IVF and Vancomycin, Flagyl, and Cefepime  for antimicrobials.   Upon reassessment, the patient appears clinically unchanged. Lactic acid has improved, however that patient's blood pressure has trended down.  Initially engaged hospitalist medicine for admission, however, the patient needed to be escalated to an ICU level of care.  PCCM was engaged and the patient was started on low dose Levophed.   While in the ED, the patient was given: Medications  lactated ringers infusion ( Intravenous New Bag/Given 12/15/22 1644)  vancomycin (VANCOREADY) IVPB 1250 mg/250 mL (has no administration in time range)  ceFEPIme (MAXIPIME) 2 g in sodium chloride 0.9 % 100 mL IVPB (has no administration in time range)  midodrine (PROAMATINE) tablet 10 mg (has no administration in time range)  acetaminophen (TYLENOL) tablet 650 mg (has no administration in time range)    Or  acetaminophen (TYLENOL) suppository 650 mg (has no administration in time range)  ondansetron (ZOFRAN) tablet 4 mg (has no administration in time range)    Or  ondansetron (ZOFRAN) injection 4 mg (has no administration in time range)  albuterol (PROVENTIL) (2.5 MG/3ML) 0.083% nebulizer solution 2.5 mg (has no administration in time range)  thiamine (VITAMIN B1) injection 100 mg (has no administration in time range)  multivitamin with minerals tablet 1 tablet (has no administration in time range)  docusate sodium (COLACE) capsule 100 mg (has no administration in time range)  polyethylene glycol (MIRALAX / GLYCOLAX) packet 17 g (has no administration in time range)  heparin injection 5,000 Units (has no administration in time range)  0.9 %  sodium chloride infusion (has no administration in time range)  norepinephrine (LEVOPHED) 4mg  in (0.016 mg/mL) premix infusion (has no administration in time range)  folic acid injection 1 mg (has no administration in time range)  lactated ringers bolus 1,000 mL (0 mLs Intravenous Stopped 12/15/22 1450)  acetaminophen (TYLENOL) tablet 1,000 mg (1,000 mg Oral Given 12/15/22 1305)  ceFEPIme (MAXIPIME) 2 g in sodium chloride 0.9 % 100 mL IVPB (0 g Intravenous Stopped 12/15/22 1331)  metroNIDAZOLE (FLAGYL) IVPB 500 mg (0 mg Intravenous Stopped 12/15/22 1359)  vancomycin (VANCOREADY) IVPB 1500 mg/300 mL (0 mg Intravenous Stopped 12/15/22  1558)  iohexol (OMNIPAQUE) 350 MG/ML injection 60 mL (60 mLs Intravenous Contrast Given 12/15/22 1433)  lactated ringers  bolus 1,000 mL (0 mLs Intravenous Stopped 12/15/22 1615)  lactated ringers bolus 500 mL (0 mLs Intravenous Stopped 12/15/22 1645)     Larene Beach is critically-ill and requires intensive care for ongoing hemodynamic monitoring and support and further resuscitation.  Final Clinical Impression(s) / ED Diagnoses Final diagnoses:  AKI (acute kidney injury) (HCC)  Sepsis with acute renal failure without septic shock, due to unspecified organism, unspecified acute renal failure type (HCC)  Septic shock [facility])    Rx / DC Orders ED Discharge Orders     None         Ernie Avena, MD 12/15/22 1708

## 2022-12-15 NOTE — Progress Notes (Signed)
eLink Physician-Brief Progress Note Patient Name: [name] DOB: [id number] MRN: [id number]   Date of Service  12/15/2022  HPI/Events of Note  Patient unable to give consent for PICC, only listed contact is a friend and that friend could not be reached on the phone.  eICU Interventions  I have therefore authorized PICC placement under medical necessity grounds.        Thomasene Lot Dezra Mandella 12/15/2022, 9:59 PM

## 2022-12-15 NOTE — Consult Note (Signed)
NAME:  [name], MRN:  [id number], DOB:  November 23, 1950, LOS: 0 ADMISSION DATE:  12/15/2022, CONSULTATION DATE:  12/15/2022 REFERRING MD:  Dr. Jerral Ralph, CHIEF COMPLAINT:  Sepsis   History of Present Illness:  [name]. [name] is a 72 year old male with past medical history of TBI with chronic dysarthria, schizophrenia, and recent hospitalization for sepsis complicated by hypercarbia who presented to the ED from a skilled nursing facility for evaluation of altered mental status. Concern at the nursing home was for generalized weakness and worsening confusion. Attempted to call nursing facility however did not get an answer.   In the ED, he was febrile to 104, tachycardic to 134, hypotensive, and was found to have purulent urine in his foley catheter. He was given 3L of IV fluids, as well as started on empiric antibiotics. Due to persistently low MAPs after 3L, PCCM was consulted for evaluation for pressor support.   Pertinent  Medical History  HTN Schizophrenia  Anxiety/Depression TBI - chronic dysarthria  Recent hospitalization from 4/9-4/25 for sepsis complicated by hypercarbia  Significant Hospital Events: Including procedures, antibiotic start and stop dates in addition to other pertinent events   5/14 - Started on Cefepime and Vancomycin  5/14 - Started on Low Dose Levo   Interim History / Subjective:  Pt seen bedside, dysarthric, but is able to state name and that he is in the hospital.   Objective   Blood pressure (!) 86/52, pulse (!) 109, temperature 100.3 F (37.9 C), resp. rate (!) 28, height 6\' 2"  (1.88 m), weight 84 kg, SpO2 97 %.        Intake/Output Summary (Last 24 hours) at 12/15/2022 1649 Last data filed at 12/15/2022 1645 Gross per 24 hour  Intake 1900 ml  Output --  Net 1900 ml   Filed Weights   12/15/22 1155  Weight: 84 kg    Examination: General: Chronically ill-appearing male, dysarthric HENT: Atraumatic, Normocephalic, Dry mucous membranes   Lungs: Lungs clear to auscultation bilaterally, increased work of breathing Cardiovascular: Tachycardic, normal rhythm, no murmurs rubs or gallops Abdomen: Nontender, non-distended Extremities: No lower extremity edema present, has some bruising present over left knee Neuro: Moves all extremities spontneously GU: Foley in place, dark orange urine present, no suprapubic pain Skin: Non-draining stage II sacral ulcer present, appears chronic  Resolved Hospital Problem list   NA  Assessment & Plan:  Severe Sepsis  Complicated UTI  - Pt has chronic indwelling foley catheter present, replaced in the ED, evidence of UTI on UA - WBC elevated at 18.0, creatinine elevated at 1.77 - Blood cultures collected, will follow  - Starting Vanc and Cefepime for empiric coverage, will narrow as able with cultures - MAPs not maintaining despite 3L of LR  - Will initiate Low dose Levo to maintain MAPs >65 - Reassuring LA trending down (4.0-->2.0)  Acute Metabolic Encephalopathy  - Likely in the setting of severe sepsis/AKI  - CT Head negative for any acute intracranial abnormality  - Baseline of dysarthria, seems close to it - Consider SLP evaluation  AKI - 2/2 dehydration and sepsis  - CT Abdomen without hydronephrosis  - Already received 3L of IV Fluids - Will continue maintenance fluids of LR 125cc/hr  - Avoid nephrotoxic medications  Chronic Indwelling Foley Cath  Failed voiding trial with prior hospitalization - Foley Catheter inserted at that time for outpatient urology evaluation  - On finasteride at home, will hold given hypotension  - Foley maintenance - Consider voiding  trial  Chronic HFpEF  Last echo showed grade 1 diastolic dysfunction, with an EF of 60-65%. Does not seem volume overloaded on exam.   Sacral Ulcer Wound care as necessary. Was septic from this during previous hospitalization. Currently non-bleeding, no discharge present, appears to be healing well  History of  EtOH use  Will check EtOH level  Schizophrenia/PTSD  Takes Zyprexa/Buspar, and can resume once encephalopathy is resolved.    Best Practice (right click and "Reselect all SmartList Selections" daily)   Diet/type: NPO DVT prophylaxis: prophylactic heparin  GI prophylaxis: N/A Lines: N/A Foley:  Yes, and it is still needed Code Status:  full code Last date of multidisciplinary goals of care discussion attempted to call facility for clarification however no answer Labs   CBC: Recent Labs  Lab 12/15/22 1154  WBC 18.0*  NEUTROABS 16.2*  HGB 13.1  HCT 41.2  MCV 91.8  PLT 160    Basic Metabolic Panel: Recent Labs  Lab 12/15/22 1154  NA 134*  K 3.8  CL 96*  CO2 21*  GLUCOSE 168*  BUN 26*  CREATININE 1.77*  CALCIUM 8.3*   GFR: Estimated Creatinine Clearance: 45.2 mL/min (A) (by C-G formula based on SCr of 1.77 mg/dL (H)). Recent Labs  Lab 12/15/22 1154 12/15/22 1217 12/15/22 1444  WBC 18.0*  --   --   LATICACIDVEN  --  4.0* 2.0*    Liver Function Tests: Recent Labs  Lab 12/15/22 1154  AST 72*  ALT 43  ALKPHOS 70  BILITOT 2.0*  PROT 6.6  ALBUMIN 2.6*   No results for input(s): "LIPASE", "AMYLASE" in the last 168 hours. No results for input(s): "AMMONIA" in the last 168 hours.  ABG    Component Value Date/Time   PHART 7.406 11/11/2022 1332   PCO2ART 45.5 11/11/2022 1332   PO2ART 133 (H) 11/11/2022 1332   HCO3 28.8 (H) 11/11/2022 1332   TCO2 30 11/11/2022 1332   O2SAT 99 11/11/2022 1332     Coagulation Profile: Recent Labs  Lab 12/15/22 1154  INR 1.1    Cardiac Enzymes: No results for input(s): "CKTOTAL", "CKMB", "CKMBINDEX", "TROPONINI" in the last 168 hours.  HbA1C: Hgb A1c MFr Bld  Date/Time Value Ref Range Status  01/02/2022 03:48 PM 5.6 4.8 - 5.6 % Final    Comment:             Prediabetes: 5.7 - 6.4          Diabetes: >6.4          Glycemic control for adults with diabetes: <7.0   10/23/2020 02:25 PM 5.5 4.8 - 5.6 % Final     Comment:             Prediabetes: 5.7 - 6.4          Diabetes: >6.4          Glycemic control for adults with diabetes: <7.0     CBG: No results for input(s): "GLUCAP" in the last 168 hours.  Review of Systems:   Unable to ascertain given mental status  Past Medical History:  He,  has a past medical history of Acute urinary retention, Anxiety, Brain injury (HCC) (1975), Complication of anesthesia (08/29/13), Difficult intubation (08/31/13), Head injury (1975), Manic depression (HCC), Pelvic fracture (HCC) (09/21/13), Schizo affective schizophrenia (HCC), and Schizophrenia (HCC).   Surgical History:   Past Surgical History:  Procedure Laterality Date   COSMETIC SURGERY Left 1975   created new ear lobe   DRESSING CHANGE UNDER  ANESTHESIA Left 08/31/2013   Procedure: DRESSING CHANGE UNDER ANESTHESIA for right ankle and left thigh;  Surgeon: Budd Palmer, MD;  Location: [facility] OR;  Service: Orthopedics;  Laterality: Left;   I & D EXTREMITY Bilateral 08/30/2013   Procedure: IRRIGATION AND DEBRIDEMENT with closure Left Thigh wound, Irrigation and debridement Right Ankle ;  Surgeon: Harvie Junior, MD;  Location: [facility] OR;  Service: Orthopedics;  Laterality: Bilateral;   I & D EXTREMITY Left 09/26/2013   Procedure: LEFT IRRIGATION AND DEBRIDEMENT Robby Sermon;  Surgeon: Budd Palmer, MD;  Location: [facility] OR;  Service: Orthopedics;  Laterality: Left;   I & D EXTREMITY Left 10/02/2013   Procedure: IRRIGATION AND DEBRIDEMENT EXTREMITY;  Surgeon: Budd Palmer, MD;  Location: [facility] OR;  Service: Orthopedics;  Laterality: Left;   INCISION AND DRAINAGE OF WOUND Left 09/21/2013   Procedure: IRRIGATION AND DEBRIDEMENT SOFT TISSUE WOUND WITH LARGE WOUND VAC PLACEMENT;  Surgeon: Budd Palmer, MD;  Location: [facility] OR;  Service: Orthopedics;  Laterality: Left;   MANDIBLE FRACTURE SURGERY  1975   SACRO-ILIAC PINNING Left 08/31/2013   Procedure: LEFT SACRO-ILIAC PINNING;  Surgeon: Budd Palmer, MD;  Location:  [facility] OR;  Service: Orthopedics;  Laterality: Left;   SKIN GRAFT     SKIN GRAFT Left 10/31/2013   left thigh      DR HANDY    SKIN SPLIT GRAFT Left 10/02/2013   Procedure: SKIN GRAFT SPLIT THICKNESS;  Surgeon: Budd Palmer, MD;  Location: [facility] OR;  Service: Orthopedics;  Laterality: Left;   SKIN SPLIT GRAFT Left 10/31/2013   Procedure: LEFT THIGH SKIN GRAFT SPLIT THICKNESS;  Surgeon: Budd Palmer, MD;  Location: [facility] OR;  Service: Orthopedics;  Laterality: Left;     Social History:   reports that he has been smoking cigars. He has never used smokeless tobacco. He reports current alcohol use of about 6.0 standard drinks of alcohol per week. He reports that he does not use drugs.   Family History:  His family history includes Bone cancer in his father; Diabetes in his mother; Obsessive Compulsive Disorder in his father. There is no history of Colon cancer, Rectal cancer, Liver cancer, or Pancreatic cancer.   Allergies Allergies  Allergen Reactions   Penicillins Other (See Comments)    Unsure of reaction, had allergic reaction as a child and has not taken since then     Home Medications  Prior to Admission medications   Medication Sig Start Date End Date Taking? Authorizing Provider  acetaminophen (TYLENOL) 325 MG tablet Take 2 tablets (650 mg total) by mouth every 6 (six) hours as needed for moderate pain (headache). 11/26/22   Leroy Sea, MD  busPIRone (BUSPAR) 15 MG tablet Take 1 tablet (15 mg total) by mouth 3 (three) times daily. Patient taking differently: Take 15 mg by mouth 3 (three) times daily. Take as needed for anxiety 08/27/22   Park Pope, MD  ezetimibe (ZETIA) 10 MG tablet Take 1 tablet (10 mg total) by mouth daily. 11/26/22   Leroy Sea, MD  finasteride (PROSCAR) 5 MG tablet Take 1 tablet (5 mg total) by mouth daily. 11/27/22   Leroy Sea, MD  midodrine (PROAMATINE) 10 MG tablet Take 1 tablet (10 mg total) by mouth every 8 (eight) hours. 11/26/22   Leroy Sea, MD  OLANZapine (ZYPREXA) 20 MG tablet Take 1 tablet (20 mg total) by mouth at bedtime. 08/27/22   Park Pope, MD  rosuvastatin (CRESTOR) 40  MG tablet Take 1 tablet (40 mg total) by mouth daily. 03/17/22   Sharlene Dory, NP  thiamine (VITAMIN B-1) 100 MG tablet Take 1 tablet (100 mg total) by mouth daily. 11/27/22   Leroy Sea, MD     Critical care time: 40 Minutes    Olegario Messier, MD Internal Medicine Resident PGY-1 Pager: [phone]

## 2022-12-15 NOTE — Sepsis Progress Note (Signed)
Notified provider and bedside nurse of need to order and draw repeat lactic acid.  

## 2022-12-15 NOTE — Progress Notes (Signed)
Peripherally Inserted Central Catheter Placement  The IV Nurse has discussed with the patient and/or persons authorized to consent for the patient, the purpose of this procedure and the potential benefits and risks involved with this procedure.  The benefits include less needle sticks, lab draws from the catheter, and the patient may be discharged home with the catheter. Risks include, but not limited to, infection, bleeding, blood clot (thrombus formation), and puncture of an artery; nerve damage and irregular heartbeat and possibility to perform a PICC exchange if needed/ordered by physician.  Alternatives to this procedure were also discussed.  Bard Power PICC patient education guide, fact sheet on infection prevention and patient information card has been provided to patient /or left at bedside.  Medical necessity consent from Dr. Warrick Parisian.  PICC Placement Documentation  PICC Triple Lumen 12/15/22 Right Brachial 38 cm 0 cm (Active)  Indication for Insertion or Continuance of Line Vasoactive infusions 12/15/22 2246  Exposed Catheter (cm) 0 cm 12/15/22 2246  Site Assessment Clean, Dry, Intact 12/15/22 2246  Lumen #1 Status Flushed;Saline locked;Blood return noted 12/15/22 2246  Lumen #2 Status Flushed;Saline locked;Blood return noted 12/15/22 2246  Lumen #3 Status Flushed;Saline locked;Blood return noted 12/15/22 2246  Dressing Type Transparent;Securing device 12/15/22 2246  Dressing Status Antimicrobial disc in place;Clean, Dry, Intact 12/15/22 2246  Safety Lock Not Applicable 12/15/22 2246  Line Care Connections checked and tightened 12/15/22 2246  Line Adjustment (NICU/IV Team Only) No 12/15/22 2246  Dressing Intervention New dressing 12/15/22 2246  Dressing Change Due 12/22/22 12/15/22 2246       Thierno Hun, Lajean Manes 12/15/2022, 10:47 PM

## 2022-12-15 NOTE — Progress Notes (Addendum)
eLink Physician-Brief Progress Note Patient Name: [name] DOB: [birth year] MRN: [id number]   Date of Service  12/15/2022  HPI/Events of Note  Peripheral Levophed is maxed at 10 mcg.  eICU Interventions  PICC line ordered. Peripheral Phenylephrine added pending line placement.        Migdalia Dk 12/15/2022, 9:44 PM

## 2022-12-16 ENCOUNTER — Inpatient Hospital Stay (HOSPITAL_COMMUNITY): Payer: Medicare Other

## 2022-12-16 DIAGNOSIS — B9562 Methicillin resistant Staphylococcus aureus infection as the cause of diseases classified elsewhere: Secondary | ICD-10-CM

## 2022-12-16 DIAGNOSIS — G9340 Encephalopathy, unspecified: Secondary | ICD-10-CM

## 2022-12-16 DIAGNOSIS — R578 Other shock: Secondary | ICD-10-CM

## 2022-12-16 DIAGNOSIS — N179 Acute kidney failure, unspecified: Secondary | ICD-10-CM | POA: Diagnosis not present

## 2022-12-16 DIAGNOSIS — R6521 Severe sepsis with septic shock: Secondary | ICD-10-CM | POA: Diagnosis not present

## 2022-12-16 DIAGNOSIS — R7881 Bacteremia: Secondary | ICD-10-CM

## 2022-12-16 DIAGNOSIS — A419 Sepsis, unspecified organism: Secondary | ICD-10-CM

## 2022-12-16 LAB — PHOSPHORUS: Phosphorus: 2.8 mg/dL (ref 2.5–4.6)

## 2022-12-16 LAB — BLOOD CULTURE ID PANEL (REFLEXED) - BCID2

## 2022-12-16 LAB — CBC
HCT: 32.2 % — ABNORMAL LOW (ref 39.0–52.0)
Hemoglobin: 10.5 g/dL — ABNORMAL LOW (ref 13.0–17.0)
MCH: 28.5 pg (ref 26.0–34.0)
MCHC: 32.6 g/dL (ref 30.0–36.0)
MCV: 87.5 fL (ref 80.0–100.0)
Platelets: 117 10*3/uL — ABNORMAL LOW (ref 150–400)
RBC: 3.68 MIL/uL — ABNORMAL LOW (ref 4.22–5.81)
RDW: 15 % (ref 11.5–15.5)
WBC: 14.4 10*3/uL — ABNORMAL HIGH (ref 4.0–10.5)
nRBC: 0 % (ref 0.0–0.2)

## 2022-12-16 LAB — GLUCOSE, CAPILLARY
Glucose-Capillary: 122 mg/dL — ABNORMAL HIGH (ref 70–99)
Glucose-Capillary: 127 mg/dL — ABNORMAL HIGH (ref 70–99)
Glucose-Capillary: 130 mg/dL — ABNORMAL HIGH (ref 70–99)
Glucose-Capillary: 132 mg/dL — ABNORMAL HIGH (ref 70–99)
Glucose-Capillary: 146 mg/dL — ABNORMAL HIGH (ref 70–99)
Glucose-Capillary: 161 mg/dL — ABNORMAL HIGH (ref 70–99)

## 2022-12-16 LAB — COMPREHENSIVE METABOLIC PANEL
ALT: 40 U/L (ref 0–44)
AST: 78 U/L — ABNORMAL HIGH (ref 15–41)
Albumin: 1.9 g/dL — ABNORMAL LOW (ref 3.5–5.0)
Alkaline Phosphatase: 58 U/L (ref 38–126)
Anion gap: 9 (ref 5–15)
BUN: 21 mg/dL (ref 8–23)
CO2: 25 mmol/L (ref 22–32)
Calcium: 8 mg/dL — ABNORMAL LOW (ref 8.9–10.3)
Chloride: 100 mmol/L (ref 98–111)
Creatinine, Ser: 1.3 mg/dL — ABNORMAL HIGH (ref 0.61–1.24)
GFR, Estimated: 59 mL/min — ABNORMAL LOW (ref >60)
Glucose, Bld: 136 mg/dL — ABNORMAL HIGH (ref 70–99)
Potassium: 3 mmol/L — ABNORMAL LOW (ref 3.5–5.1)
Sodium: 134 mmol/L — ABNORMAL LOW (ref 135–145)
Total Bilirubin: 1.3 mg/dL — ABNORMAL HIGH (ref 0.3–1.2)
Total Protein: 5.3 g/dL — ABNORMAL LOW (ref 6.5–8.1)

## 2022-12-16 LAB — ECHOCARDIOGRAM COMPLETE
AR max vel: 2.33 cm2
AV Area VTI: 2.63 cm2
AV Area mean vel: 2.53 cm2
AV Mean grad: 3 mmHg
AV Peak grad: 5.4 mmHg
Ao pk vel: 1.16 m/s
Area-P 1/2: 8.25 cm2
Height: 74 in
S' Lateral: 2.5 cm
Weight: 2959.46 oz

## 2022-12-16 LAB — BASIC METABOLIC PANEL
Anion gap: 14 (ref 5–15)
BUN: 19 mg/dL (ref 8–23)
CO2: 22 mmol/L (ref 22–32)
Calcium: 8 mg/dL — ABNORMAL LOW (ref 8.9–10.3)
Chloride: 98 mmol/L (ref 98–111)
Creatinine, Ser: 1.1 mg/dL (ref 0.61–1.24)
GFR, Estimated: >60 mL/min (ref >60)
Glucose, Bld: 145 mg/dL — ABNORMAL HIGH (ref 70–99)
Potassium: 3.8 mmol/L (ref 3.5–5.1)
Sodium: 134 mmol/L — ABNORMAL LOW (ref 135–145)

## 2022-12-16 LAB — RAPID URINE DRUG SCREEN, HOSP PERFORMED
Amphetamines: NOT DETECTED
Barbiturates: NOT DETECTED
Benzodiazepines: NOT DETECTED
Cocaine: NOT DETECTED
Opiates: NOT DETECTED
Tetrahydrocannabinol: NOT DETECTED

## 2022-12-16 LAB — MAGNESIUM
Magnesium: 1.8 mg/dL (ref 1.7–2.4)
Magnesium: 2.1 mg/dL (ref 1.7–2.4)

## 2022-12-16 LAB — CULTURE, BLOOD (ROUTINE X 2)

## 2022-12-16 MED ORDER — BUSPIRONE HCL 15 MG PO TABS
15.0000 mg | ORAL_TABLET | Freq: Three times a day (TID) | ORAL | Status: DC
  Administered 2022-12-16 – 2022-12-18 (×7): 15 mg via ORAL
  Filled 2022-12-16 (×7): qty 1

## 2022-12-16 MED ORDER — FOLIC ACID 1 MG PO TABS
1.0000 mg | ORAL_TABLET | Freq: Every day | ORAL | Status: DC
  Administered 2022-12-17 – 2022-12-18 (×2): 1 mg via ORAL
  Filled 2022-12-16 (×3): qty 1

## 2022-12-16 MED ORDER — SODIUM CHLORIDE 0.9 % IV SOLN: 2.0000 g | Freq: Three times a day (TID) | INTRAVENOUS | Status: DC

## 2022-12-16 MED ORDER — THIAMINE MONONITRATE 100 MG PO TABS
100.0000 mg | ORAL_TABLET | Freq: Every day | ORAL | Status: DC
  Administered 2022-12-16 – 2022-12-18 (×3): 100 mg via ORAL
  Filled 2022-12-16 (×3): qty 1

## 2022-12-16 MED ORDER — ENSURE ENLIVE PO LIQD
237.0000 mL | Freq: Two times a day (BID) | ORAL | Status: DC
  Administered 2022-12-17 – 2022-12-22 (×5): 237 mL via ORAL

## 2022-12-16 MED ORDER — VASOPRESSIN 20 UNITS/100 ML INFUSION FOR SHOCK
0.0000 [IU]/min | INTRAVENOUS | Status: DC
  Administered 2022-12-16 – 2022-12-17 (×3): 0.03 [IU]/min via INTRAVENOUS
  Filled 2022-12-16 (×3): qty 100

## 2022-12-16 MED ORDER — NOREPINEPHRINE 16 MG/250ML-% IV SOLN
0.0000 ug/min | INTRAVENOUS | Status: DC
  Administered 2022-12-16: 10 ug/min via INTRAVENOUS
  Administered 2022-12-16: 23 ug/min via INTRAVENOUS
  Administered 2022-12-17: 19 ug/min via INTRAVENOUS
  Administered 2022-12-18: 14 ug/min via INTRAVENOUS
  Administered 2022-12-19: 8 ug/min via INTRAVENOUS
  Filled 2022-12-16 (×5): qty 250

## 2022-12-16 MED ORDER — POTASSIUM CHLORIDE 10 MEQ/50ML IV SOLN
10.0000 meq | INTRAVENOUS | Status: AC
  Administered 2022-12-16 (×5): 10 meq via INTRAVENOUS
  Filled 2022-12-16 (×5): qty 50

## 2022-12-16 MED ORDER — VANCOMYCIN HCL 1500 MG/300ML IV SOLN
1500.0000 mg | INTRAVENOUS | Status: DC
  Administered 2022-12-16: 1500 mg via INTRAVENOUS
  Filled 2022-12-16 (×2): qty 300

## 2022-12-16 MED ORDER — OLANZAPINE 10 MG PO TABS
20.0000 mg | ORAL_TABLET | Freq: Every day | ORAL | Status: DC
  Administered 2022-12-16 – 2022-12-17 (×2): 20 mg via ORAL
  Filled 2022-12-16 (×3): qty 2

## 2022-12-16 MED ORDER — EZETIMIBE 10 MG PO TABS
10.0000 mg | ORAL_TABLET | Freq: Every day | ORAL | Status: DC
  Administered 2022-12-16 – 2022-12-18 (×3): 10 mg via ORAL
  Filled 2022-12-16 (×3): qty 1

## 2022-12-16 MED ORDER — VASOPRESSIN 20 UNITS/100 ML INFUSION FOR SHOCK
INTRAVENOUS | Status: AC
  Administered 2022-12-16: 0.03 [IU]/min via INTRAVENOUS
  Filled 2022-12-16: qty 100

## 2022-12-16 MED ORDER — JUVEN PO PACK
1.0000 | PACK | Freq: Two times a day (BID) | ORAL | Status: DC
  Administered 2022-12-17 – 2022-12-23 (×11): 1
  Filled 2022-12-16 (×13): qty 1

## 2022-12-16 MED ORDER — THIAMINE HCL 100 MG/ML IJ SOLN
100.0000 mg | Freq: Every day | INTRAMUSCULAR | Status: DC
  Filled 2022-12-16: qty 2

## 2022-12-16 MED ORDER — PROSOURCE TF20 ENFIT COMPATIBL EN LIQD
60.0000 mL | Freq: Two times a day (BID) | ENTERAL | Status: DC
  Administered 2022-12-16 – 2022-12-22 (×11): 60 mL
  Filled 2022-12-16 (×11): qty 60

## 2022-12-16 MED ORDER — SODIUM CHLORIDE 0.9 % IV SOLN: INTRAVENOUS | Status: DC | PRN

## 2022-12-16 MED ORDER — OSMOLITE 1.5 CAL PO LIQD
1000.0000 mL | ORAL | Status: DC
  Administered 2022-12-16 – 2022-12-21 (×6): 1000 mL
  Filled 2022-12-16 (×3): qty 1000

## 2022-12-16 MED ORDER — LACTATED RINGERS IV BOLUS
1000.0000 mL | Freq: Once | INTRAVENOUS | Status: AC
  Administered 2022-12-16: 1000 mL via INTRAVENOUS

## 2022-12-16 MED ORDER — MAGNESIUM SULFATE 2 GM/50ML IV SOLN
2.0000 g | Freq: Once | INTRAVENOUS | Status: AC
  Administered 2022-12-16: 2 g via INTRAVENOUS
  Filled 2022-12-16: qty 50

## 2022-12-16 MED ORDER — DOCUSATE SODIUM 50 MG/5ML PO LIQD: 100.0000 mg | Freq: Two times a day (BID) | ORAL | Status: DC | PRN

## 2022-12-16 MED ORDER — LACTATED RINGERS IV SOLN: INTRAVENOUS | Status: AC

## 2022-12-16 MED ORDER — ROSUVASTATIN CALCIUM 20 MG PO TABS
40.0000 mg | ORAL_TABLET | Freq: Every day | ORAL | Status: DC
  Administered 2022-12-16 – 2022-12-18 (×3): 40 mg via ORAL
  Filled 2022-12-16 (×3): qty 2

## 2022-12-16 MED ORDER — MIDODRINE HCL 5 MG PO TABS
10.0000 mg | ORAL_TABLET | Freq: Three times a day (TID) | ORAL | Status: DC
  Administered 2022-12-16 – 2022-12-17 (×4): 10 mg via ORAL
  Filled 2022-12-16 (×5): qty 2

## 2022-12-16 NOTE — Procedures (Signed)
Modified Barium Swallow Study  Patient Details  Name: [name] MRN: [id number] Date of Birth: February 06, 1951  Today's Date: 12/16/2022  Modified Barium Swallow completed.  Full report located under Chart Review in the Imaging Section.  History of Present Illness Patient is a 72 y.o. male with PMH: anxiety, TBI, manic depression, schizo affective schizophrenia, dysphagia, chronic dysarthria. He was admitted last month and at time of discharge, recommended diet from SLP (following MBSx2) was Regular solids, honey thick liquids. Currently, patient presented to the hospital on 12/15/22 from SNF for evaluation of AMS. In ED, patient encephalopathic, febrile at 104F, tachycardic and had purulent urine in his Foley catheter, hypotensive. He was admitted with sepsis and is NPO awaiting SLP swallow evaluation.   Clinical Impression Patient presents with a moderate oral dysphagia and a mild pharyngeal phase dysphagia as per this MBS. As compared to most recent MBS completed one month ago, patient appears with improved swallow function. (Of note, during previous MBS, patient with Cortrak feeding tube with SLP suspecting potential impact on swallow) During today's test, patient exhibited delayed anterior to posterior transit of all boluses in oral cavity as well as mild-moderate amount of PO residuals remaining in oral cavity s/p initial swallow secondary to decreased base of tongue strength, decreased lingual ROM. During pharyngeal phase, swallow was initiated at level of vallecular sinus for all consistencies tested. (honey and nectar thick, thin, puree) Penetration above vocal cords of trace amount occured with thin and nectar thick liquids but was not consistent in occurance. Penetration to vocal cords occured with large sip of thin liquids. No aspiration observed with any of the tested barium consistencies. Barium residuals remained in vallecular sinus and pyriform sinus with all liquids but amount of  residuals increased with thicker consistencies. (worse with honey thick) Pharyngeal phase dysphagia secondary to decreased base of tongue retraction, partial laryngeal elevation, partial epiglottic inversion and partial anterior hyoid excursion. Patient did not adequately sense residuals in pharynx. No penetration or aspiration observed from residuals after the swallow was initiated or completed. Positioning did not allow for adequate view of PES or below but prior MBS did report prominent cricopharyngeal bar. SLP recommending initiating PO diet of full liquids/thin consistency and meds crushed in puree. Factors that may increase risk of adverse event in presence of aspiration Rubye Oaks & Clearance Coots 2021):    Swallow Evaluation Recommendations Recommendations: PO diet PO Diet Recommendation: Full liquid diet;Thin liquids (Level 0) Liquid Administration via: Cup;No straw Medication Administration: Crushed with puree Supervision: Staff to assist with self-feeding;Full supervision/cueing for swallowing strategies Swallowing strategies  : Slow rate;Small bites/sips;Follow solids with liquids;Check for pocketing or oral holding Postural changes: Position pt fully upright for meals Oral care recommendations: Oral care BID (2x/day);Oral care before PO      Angela Nevin, MA, CCC-SLP Speech Therapy

## 2022-12-16 NOTE — Progress Notes (Signed)
Nurse states patient having some bleeding around foley catheter that was changed out yesterday that has to be cleaned every few hours. Likely foley trauma.   Plan: -hold heparin sub cu and will do scds for dvt ppx  JD Anselm Lis [facility] 12/16/2022, 3:48 PM  Please see [email or web address] for pager details.  From 7A-7P if no response, please call [phone]. After hours, please call ELink [phone].

## 2022-12-16 NOTE — Progress Notes (Signed)
Initial Nutrition Assessment  DOCUMENTATION CODES:   Non-severe (moderate) malnutrition in context of chronic illness  INTERVENTION:   Initiate tube feeds via Cortrak once placement confirmed via x-ray: - Start Osmolite 1.5 @ 45 ml/hr and advance by 10 ml q 8 hours to goal rate of 65 ml/hr (1560 ml/day) - PROSource TF20 60 ml BID  Tube feeding regimen at goal rate provides 2500 kcal, 138 grams of protein, and 1189 ml of H2O.   - 1 packet Juven BID per tube to promote wound healing, each packet provides 95 calories, 2.5 grams of protein, and 9.8 grams of carbohydrate  - MVI with minerals daily  - Ensure Enlive po BID, each supplement provides 350 kcal and 20 grams of protein  NUTRITION DIAGNOSIS:   Moderate Malnutrition related to chronic illness (CHF, schizophrenia, TBI) as evidenced by mild fat depletion, moderate muscle depletion.  GOAL:   Patient will meet greater than or equal to 90% of their needs  MONITOR:   Supplement acceptance, PO intake, Diet advancement, Labs, Weight trends, Skin, TF tolerance  REASON FOR ASSESSMENT:   Consult Enteral/tube feeding initiation and management  ASSESSMENT:   72 year old male who presented to the ED on 5/14 with AMS. PMH of HTN, CHF, schizophrenia, homelessness, EtOH abuse, TBI, recent admission for sepsis complicated by hypercarbia. Pt admitted with severe sepsis likely cue to complicated UTI, acute metabolic encephalopathy, AKI, MRSA bacteremia.  05/15 - MBS with recommendations for full liquid diet, Cortrak placement (x-ray pending)  Discussed pt with RN and during ICU rounds. Diet was just advanced to full liquids this AM after MBS. Per RN, pt not eating anything and states that he does not want anything. CCM PA placed order for Cortrak and consult for enteral nutrition initiation and management.  Met with pt at beside. Pt unable to provide any meaningful diet and weight history at this time. Pt meets criteria for moderate  malnutrition based on NFPE.  Pt with non-pitting edema to BUE and mild pitting edema to BLE.  Medications reviewed and include: folic acid, MVI with minerals, zyprexa, thiamine, IV abx, levophed @ 10 mcg/min, vasopressin @ 0.03 units/min IVF: LR @ 150 ml/hr  Labs reviewed: sodium 134, potassium 3.0, creatinine 1.30, WBC 14.4, platelets 117 CBG's: 103-134 x 24 hours  UOP: 1475 ml x 24 hours I/O's: +4.5 L since admit  NUTRITION - FOCUSED PHYSICAL EXAM:  Flowsheet Row Most Recent Value  Orbital Region Mild depletion  Upper Arm Region Mild depletion  Thoracic and Lumbar Region Mild depletion  Buccal Region No depletion  Temple Region Mild depletion  Clavicle Bone Region Mild depletion  Clavicle and Acromion Bone Region Moderate depletion  Scapular Bone Region Moderate depletion  Dorsal Hand Mild depletion  Patellar Region Mild depletion  Anterior Thigh Region Mild depletion  Posterior Calf Region Mild depletion  Edema (RD Assessment) Mild  Hair Reviewed  Eyes Reviewed  Mouth Reviewed  [poor dentition]  Skin Reviewed  Nails Reviewed       Diet Order:   Diet Order             Diet full liquid Room service appropriate? Yes; Fluid consistency: Thin  Diet effective now                   EDUCATION NEEDS:   Not appropriate for education at this time  Skin:  Skin Assessment: Skin Integrity Issues: Stage III: bilateral buttocks (previously unstageable per WOC note) Other: skin tears bilateral knees  Last BM:  12/16/22 multiple type 6 and type 7  Height:   Ht Readings from Last 1 Encounters:  12/15/22 6\' 2"  (1.88 m)    Weight:   Wt Readings from Last 1 Encounters:  12/16/22 83.9 kg    BMI:  Body mass index is 23.75 kg/m.  Estimated Nutritional Needs:   Kcal:  2400-2600  Protein:  125-145 grams  Fluid:  >2.2 L    Mertie Clause, MS, RD, LDN Inpatient Clinical Dietitian Please see AMiON for contact information.

## 2022-12-16 NOTE — Progress Notes (Signed)
[facility] ADULT ICU REPLACEMENT PROTOCOL   The patient does apply for the [facility] Adult ICU Electrolyte Replacment Protocol based on the criteria listed below:   1.Exclusion criteria: TCTS, ECMO, Dialysis, and Myasthenia Gravis patients 2. Is GFR >/= 30 ml/min? Yes.    Patient's GFR today is 59 3. Is SCr </= 2? Yes.   Patient's SCr is 1.30 mg/dL 4. Did SCr increase >/= 0.5 in 24 hours? No. 5.Pt's weight >40kg  Yes.   6. Abnormal electrolyte(s): potassium 3.0  7. Electrolytes replaced per protocol 8.  Call MD STAT for K+ </= 2.5, Phos </= 1, or Mag </= 1 Physician:  protocol  Melvern Banker 12/16/2022 6:26 AM

## 2022-12-16 NOTE — Procedures (Signed)
Cortrak  Person Inserting Tube:  Shakeema Lippman C, RD Tube Type:  Cortrak - 43 inches Tube Size:  10 Tube Location:  Left nare Secured by: Bridle Technique Used to Measure Tube Placement:  Marking at nare/corner of mouth Cortrak Secured At:  71 cm   Cortrak Tube Team Note:  Consult received to place a Cortrak feeding tube.   X-ray is required, abdominal x-ray has been ordered by the Cortrak team. Please confirm tube placement before using the Cortrak tube.   If the tube becomes dislodged please keep the tube and contact the Cortrak team at [email or web address] for replacement.  If after hours and replacement cannot be delayed, place a NG tube and confirm placement with an abdominal x-ray.    [name] Owczarzak P., RD, LDN, CNSC See AMiON for contact information    

## 2022-12-16 NOTE — Consult Note (Signed)
[facility]    Date of Admission:  12/15/2022   Total days of inpatient antibiotics 2        Reason for Consult: MRSA    Principal Problem:   Sepsis (HCC) Active Problems:   Schizophrenia (HCC)   Mixed hyperlipidemia   Essential hypertension   Alcohol dependence in remission (HCC)   PTSD (post-traumatic stress disorder)   AKI (acute kidney injury) (HCC)   Encephalopathy acute   Septic shock [facility])   Assessment: 72 year old male with history of dementia, schizophrenia, recent admission for a sepsis due to sacral decubitus ulcers treated with 7 days antibiotics (4/9-4/25).  Course complicated by acute hypoxic hypercapnic respiratory failure after getting IV Ativan requiring ICU transfer and ultimately sent from SNF for altered mental status found to have:  #MRSA bacteremia #Sacral wounds # Previous ORIF screws and ileum and into sacrum across the left SI joint - Presented with temp of 104, WBC 18 K.  CT abdomen pelvis showed no acute abnormalities.  Has history noted purulent drainage from Foley catheter, decompressed on imaging.  He was admitted for sepsis secondary to UTI.  ID engaged as cultures grew MRSA.  Foley catheter was placed at last admission as he failed voiding trial. - PICC placed on 5/14 - Blood cultures on 5/14 grew MRSA -I was able to assess the sacral wound today, there was a Phlexy tube placed earlier,  fecal matter could easily enter wound given location.  I suspect that the port of entry was likely due to left decubitus wound leading to bacteremia as a tunnels.  Although there are no acute findings on imaging and surrounding tissue sacral wound did not look erythematous.  I examined his skin, did not see signs of thrombophlebitis. Recommendations:  -Repeat blood cultures - Pull PICC - TTE - DC cefepime as clinical picture can be attributed to MRSA bacteremia rather than a UTI.  Appearance of urine not indicative of UTI, no  concerning finding on imaging. - Continue vancomycin - Engage wound care for sacral wound  -Orthopantogram due to poor dentition #History of schizophrenia/PTSD - Management per primary   #Acute metabolic encephalopathy - CT head negative -remote history of TBI   #Remote history of left shoulder fracture after mechanical fall in August 2021 ------------------------------------------------------------------------------------------------ Antibiotics: Vancomycin and cefepime 5/14-  Cultures: Blood 5/14 2/2 MRSA    HPI: [name] is a 72 y.o. male with history of TBI-chronic dysarthria, schizophrenia, left shoulder fracture with malunion following fall in 2021 with recent hospitalization on 4/9-4/25 for sepsis complicated by hypercarbia after he was given IV Ativan subsequently discharged to SNF with Foley catheter as he failed voiding trial presented to the ED from SNF for evaluation of altered mental status.  Patient is a poor historian treated per documentation was obtained after speaking to ED staff.  Staff at [facility] noticed generalized weakness and worsening confusion, patient had temp of 104 tachycardic noted purulent urine in his Foley catheter. Arrival to the ED temp 104, WBC 18. CT abdomen pelvis showed no acute findings.  Mild dilatation intrarenal collecting system and ureters relatively symmetric presumably due to bladder outlet obstruction.  Bladder multi decompressed by Foley.  Engaged as patient found to have MRSA positive blood cultures Review of Systems: Review of Systems  All other systems reviewed and are negative.   Past Medical History:  Diagnosis Date   Acute urinary retention    Anxiety  Brain injury (HCC) 1975   Complication of anesthesia 08/29/13   difficulty due to large tongue, anterior larynx and limited opening   Difficult intubation 08/31/13   Head injury 1975   "Brain Stem Contusion"   Manic depression (HCC)    Pelvic fracture (HCC)  09/21/13   Inferior Pubic Ramus, Left Superior Ramus   Schizo affective schizophrenia (HCC)    Schizophrenia (HCC)     Social History   Tobacco Use   Smoking status: Every Day    Types: Cigars   Smokeless tobacco: Never  Vaping Use   Vaping Use: Never used  Substance Use Topics   Alcohol use: Yes    Alcohol/week: 6.0 standard drinks of alcohol    Types: 6 Cans of beer per week    Comment: weekly   Drug use: No    Family History  Problem Relation Age of Onset   Diabetes Mother    Obsessive Compulsive Disorder Father    Bone cancer Father    Colon cancer Neg Hx    Rectal cancer Neg Hx    Liver cancer Neg Hx    Pancreatic cancer Neg Hx    Scheduled Meds:  busPIRone  15 mg Oral TID   Chlorhexidine Gluconate Cloth  6 each Topical Q0600   ezetimibe  10 mg Oral Daily   folic acid  1 mg Oral Daily   heparin  5,000 Units Subcutaneous Q8H   midodrine  10 mg Oral Q8H   multivitamin with minerals  1 tablet Oral Daily   mupirocin ointment  1 Application Nasal BID   OLANZapine  20 mg Oral QHS   rosuvastatin  40 mg Oral Daily   sodium chloride flush  10-40 mL Intracatheter Q12H   thiamine  100 mg Oral Daily   Continuous Infusions:  sodium chloride Stopped (12/16/22 0954)   sodium chloride     sodium chloride     lactated ringers 150 mL/hr at 12/16/22 1101   norepinephrine (LEVOPHED) Adult infusion 10 mcg/min (12/16/22 1202)   vancomycin 1,500 mg (12/16/22 1429)   vasopressin 0.03 Units/min (12/16/22 1000)   PRN Meds:.Place/Maintain arterial line **AND** sodium chloride, acetaminophen **OR** acetaminophen, albuterol, docusate, ondansetron **OR** ondansetron (ZOFRAN) IV, polyethylene glycol, sodium chloride flush Allergies  Allergen Reactions   Penicillins Other (See Comments)    Unsure of reaction, had allergic reaction as a child and has not taken since then    OBJECTIVE: Blood pressure (!) 84/59, pulse (!) 104, temperature (!) 100.8 F (38.2 C), resp. rate (!) 24,  height 6\' 2"  (1.88 m), weight 83.9 kg, SpO2 99 %.  Physical Exam Constitutional:      General: He is not in acute distress.    Appearance: He is normal weight. He is not toxic-appearing.  HENT:     Head: Normocephalic and atraumatic.     Right Ear: External ear normal.     Left Ear: External ear normal.     Nose: No congestion or rhinorrhea.     Mouth/Throat:     Mouth: Mucous membranes are moist.     Pharynx: Oropharynx is clear.  Eyes:     Extraocular Movements: Extraocular movements intact.     Conjunctiva/sclera: Conjunctivae normal.     Pupils: Pupils are equal, round, and reactive to light.  Cardiovascular:     Rate and Rhythm: Normal rate and regular rhythm.     Heart sounds: No murmur heard.    No friction rub. No gallop.  Pulmonary:  Effort: Pulmonary effort is normal.     Breath sounds: Normal breath sounds.  Abdominal:     General: Abdomen is flat. Bowel sounds are normal.     Palpations: Abdomen is soft.  Musculoskeletal:        General: No swelling.     Cervical back: Normal range of motion and neck supple.  Skin:    General: Skin is warm and dry.  Neurological:     General: No focal deficit present.     Mental Status: He is oriented to person, place, and time.  Psychiatric:        Mood and Affect: Mood normal.     Lab Results Lab Results  Component Value Date   WBC 14.4 (H) 12/16/2022   HGB 10.5 (L) 12/16/2022   HCT 32.2 (L) 12/16/2022   MCV 87.5 12/16/2022   PLT 117 (L) 12/16/2022    Lab Results  Component Value Date   CREATININE 1.30 (H) 12/16/2022   BUN 21 12/16/2022   NA 134 (L) 12/16/2022   K 3.0 (L) 12/16/2022   CL 100 12/16/2022   CO2 25 12/16/2022    Lab Results  Component Value Date   ALT 40 12/16/2022   AST 78 (H) 12/16/2022   ALKPHOS 58 12/16/2022   BILITOT 1.3 (H) 12/16/2022       Danelle Earthly, MD [facility] 12/16/2022, 2:44 PM   I have personally spent 120  minutes involved in face-to-face and non-face-to-face activities for this patient on the day of the visit. Professional time spent includes the following activities: Preparing to see the patient (review of tests), Obtaining and/or reviewing separately obtained history (admission/discharge record), Performing a medically appropriate examination and/or evaluation , Ordering medications/tests/procedures, referring and communicating with other health care professionals, Documenting clinical information in the EMR, Independently interpreting results (not separately reported), Communicating results to the patient/family/caregiver, Counseling and educating the patient/family/caregiver and Care coordination (not separately reported).

## 2022-12-16 NOTE — Progress Notes (Addendum)
NAME:  [name], MRN:  [id number], DOB:  [birth year], LOS: 1 ADMISSION DATE:  12/15/2022, CONSULTATION DATE:  12/15/2022 REFERRING MD:  Dr. Jerral Ralph, CHIEF COMPLAINT:  Sepsis   History of Present Illness:  [name] is a 72 year old male with past medical history of TBI with chronic dysarthria, schizophrenia, and recent hospitalization for sepsis complicated by hypercarbia who presented to the ED from a skilled nursing facility for evaluation of altered mental status. Concern at the nursing home was for generalized weakness and worsening confusion. Attempted to call nursing facility however did not get an answer.   In the ED, he was febrile to 104, tachycardic to 134, hypotensive, and was found to have purulent urine in his foley catheter. He was given 3L of IV fluids, as well as started on empiric antibiotics. Due to persistently low MAPs after 3L, PCCM was consulted for evaluation for pressor support.   Pertinent  Medical History  HTN Schizophrenia  Anxiety/Depression TBI - chronic dysarthria  Recent hospitalization from 4/9-4/25 for sepsis complicated by hypercarbia  Significant Hospital Events: Including procedures, antibiotic start and stop dates in addition to other pertinent events   5/14 - Started on Cefepime and Vancomycin  5/14 - Started on Low Dose Levo  5/15- bcx2 growing MRSA; on 20 levo and 0.03 vaso; picc line placed  Interim History / Subjective:   Overnight worsening bp on 20 levo and 0.03 vaso Picc line placed Patient more alert this am Asking for water Bcx2 growing mrsa  Objective   Blood pressure 111/69, pulse (!) 108, temperature (!) 100.8 F (38.2 C), resp. rate (!) 24, height 6\' 2"  (1.88 m), weight 83.9 kg, SpO2 98 %.        Intake/Output Summary (Last 24 hours) at 12/16/2022 0745 Last data filed at 12/16/2022 0700 Gross per 24 hour  Intake 5868.86 ml  Output 1475 ml  Net 4393.86 ml    Filed Weights   12/15/22 1155 12/15/22 1740  12/16/22 0500  Weight: 84 kg 83.7 kg 83.9 kg    Examination: General: chronically ill appearing male in NAD HEENT: MM pink/dry; Impact in place Neuro: AO; MAE CV: s1s2, RRR, no m/r/g PULM:  dim clear BS bilaterally; [place] 2L GI: soft, bsx4 active  Extremities: warm/dry, no edema; sacral stage II ulcer with no erythema/drainage Skin: no rashes or lesions    Resolved Hospital Problem list   NA  Assessment & Plan:  Septic shock Complicated UTI from chronic indwelling foley MRSA bacteremia P: -cont levo and vaso for map goal >65 -IV fluids -ID auto consulted; appreciate recs; cont cefepime/vanc  -f/u cultures/susceptibilities -trend wbc/fever curve -slp to perform swallow eval and resume home midodrine; may need cor track placement if fails swallow -check echo  Acute Metabolic Encephalopathy  -CT head no acute abnormality P: -mental status appears to be at baseline -cont treatment for sepsis as above -limit sedating meds  AKI Hypokalemia - CT Abdomen without hydronephrosis  P: -K repleted; will give mag as well and check mag -iv fluids -Trend BMP / urinary output -Replace electrolytes as indicated -Avoid nephrotoxic agents, ensure adequate renal perfusion  Chronic Indwelling Foley Cath  Failed voiding trial with prior hospitalization - Foley Catheter inserted at that time for outpatient urology evaluation  P: -foley changed out -monitor UOP -hold finasteride for now  Chronic HFpEF  Last echo showed grade 1 diastolic dysfunction, with an EF of 60-65%. Does not seem volume overloaded on exam.  P: -daily weights;  strict I/O's  Sacral Ulcer Wound care as necessary. Was septic from this during previous hospitalization. Currently non-bleeding, no discharge present, appears to be healing well P: -woc consult  History of EtOH use  Ethanol wnl P: -resume home thiamine  Schizophrenia/PTSD  P: -resume zyprexa and buspar when able to take  PO  Anemia Thrombocytopenia -likely sepsis related P: -trend cbc   Best Practice (right click and "Reselect all SmartList Selections" daily)   Diet/type: NPO; slp eval today DVT prophylaxis: prophylactic heparin  GI prophylaxis: N/A Lines: N/A Foley:  Yes, and it is still needed Code Status:  full code Last date of multidisciplinary goals of care discussion: 5/15 attempted to contact friend [name] over phone but no answer  Labs   CBC: Recent Labs  Lab 12/15/22 1154 12/16/22 0533  WBC 18.0* 14.4*  NEUTROABS 16.2*  --   HGB 13.1 10.5*  HCT 41.2 32.2*  MCV 91.8 87.5  PLT 160 117*     Basic Metabolic Panel: Recent Labs  Lab 12/15/22 1154 12/16/22 0533  NA 134* 134*  K 3.8 3.0*  CL 96* 100  CO2 21* 25  GLUCOSE 168* 136*  BUN 26* 21  CREATININE 1.77* 1.30*  CALCIUM 8.3* 8.0*    GFR: Estimated Creatinine Clearance: 61.5 mL/min (A) (by C-G formula based on SCr of 1.3 mg/dL (H)). Recent Labs  Lab 12/15/22 1154 12/15/22 1217 12/15/22 1444 12/15/22 1838 12/15/22 2213 12/16/22 0533  PROCALCITON  --   --   --  10.18  --   --   WBC 18.0*  --   --   --   --  14.4*  LATICACIDVEN  --  4.0* 2.0* 2.3* 1.9  --      Liver Function Tests: Recent Labs  Lab 12/15/22 1154 12/16/22 0533  AST 72* 78*  ALT 43 40  ALKPHOS 70 58  BILITOT 2.0* 1.3*  PROT 6.6 5.3*  ALBUMIN 2.6* 1.9*    No results for input(s): "LIPASE", "AMYLASE" in the last 168 hours. Recent Labs  Lab 12/15/22 2213  AMMONIA 17    ABG    Component Value Date/Time   PHART 7.406 11/11/2022 1332   PCO2ART 45.5 11/11/2022 1332   PO2ART 133 (H) 11/11/2022 1332   HCO3 28.8 (H) 11/11/2022 1332   TCO2 30 11/11/2022 1332   O2SAT 99 11/11/2022 1332     Coagulation Profile: Recent Labs  Lab 12/15/22 1154  INR 1.1     Cardiac Enzymes: No results for input(s): "CKTOTAL", "CKMB", "CKMBINDEX", "TROPONINI" in the last 168 hours.  HbA1C: Hgb A1c MFr Bld  Date/Time Value Ref Range Status   01/02/2022 03:48 PM 5.6 4.8 - 5.6 % Final    Comment:             Prediabetes: 5.7 - 6.4          Diabetes: >6.4          Glycemic control for adults with diabetes: <7.0   10/23/2020 02:25 PM 5.5 4.8 - 5.6 % Final    Comment:             Prediabetes: 5.7 - 6.4          Diabetes: >6.4          Glycemic control for adults with diabetes: <7.0     CBG: Recent Labs  Lab 12/15/22 1748 12/15/22 1937 12/15/22 2326 12/16/22 0332 12/16/22 0718  GLUCAP 134* 103* 111* 122* 127*    Review of Systems:   Unable  to ascertain given mental status  Past Medical History:  He,  has a past medical history of Acute urinary retention, Anxiety, Brain injury (HCC) (1975), Complication of anesthesia (08/29/13), Difficult intubation (08/31/13), Head injury (1975), Manic depression (HCC), Pelvic fracture (HCC) (09/21/13), Schizo affective schizophrenia (HCC), and Schizophrenia (HCC).   Surgical History:   Past Surgical History:  Procedure Laterality Date   COSMETIC SURGERY Left 1975   created new ear lobe   DRESSING CHANGE UNDER ANESTHESIA Left 08/31/2013   Procedure: DRESSING CHANGE UNDER ANESTHESIA for right ankle and left thigh;  Surgeon: Budd Palmer, MD;  Location: [facility] OR;  Service: Orthopedics;  Laterality: Left;   I & D EXTREMITY Bilateral 08/30/2013   Procedure: IRRIGATION AND DEBRIDEMENT with closure Left Thigh wound, Irrigation and debridement Right Ankle ;  Surgeon: Harvie Junior, MD;  Location: [facility] OR;  Service: Orthopedics;  Laterality: Bilateral;   I & D EXTREMITY Left 09/26/2013   Procedure: LEFT IRRIGATION AND DEBRIDEMENT Robby Sermon;  Surgeon: Budd Palmer, MD;  Location[facility] OR;  Service: Orthopedics;  Laterality: Left;   I & D EXTREMITY Left 10/02/2013   Procedure: IRRIGATION AND DEBRIDEMENT EXTREMITY;  Surgeon: Budd Palmer, MD;  Location: [facility] OR;  Service: Orthopedics;  Laterality: Left;   INCISION AND DRAINAGE OF WOUND Left 09/21/2013   Procedure: IRRIGATION AND  DEBRIDEMENT SOFT TISSUE WOUND WITH LARGE WOUND VAC PLACEMENT;  Surgeon: Budd Palmer, MD;  Location: [facility] OR;  Service: Orthopedics;  Laterality: Left;   MANDIBLE FRACTURE SURGERY  1975   SACRO-ILIAC PINNING Left 08/31/2013   Procedure: LEFT SACRO-ILIAC PINNING;  Surgeon: Budd Palmer, MD;  Location: [facility] OR;  Service: Orthopedics;  Laterality: Left;   SKIN GRAFT     SKIN GRAFT Left 10/31/2013   left thigh      DR HANDY    SKIN SPLIT GRAFT Left 10/02/2013   Procedure: SKIN GRAFT SPLIT THICKNESS;  Surgeon: Budd Palmer, MD;  Location[facility] OR;  Service: Orthopedics;  Laterality: Left;   SKIN SPLIT GRAFT Left 10/31/2013   Procedure: LEFT THIGH SKIN GRAFT SPLIT THICKNESS;  Surgeon: Budd Palmer, MD;  Location: [facility] OR;  Service: Orthopedics;  Laterality: Left;     Social History:   reports that he has been smoking cigars. He has never used smokeless tobacco. He reports current alcohol use of about 6.0 standard drinks of alcohol per week. He reports that he does not use drugs.   Family History:  His family history includes Bone cancer in his father; Diabetes in his mother; Obsessive Compulsive Disorder in his father. There is no history of Colon cancer, Rectal cancer, Liver cancer, or Pancreatic cancer.   Allergies Allergies  Allergen Reactions   Penicillins Other (See Comments)    Unsure of reaction, had allergic reaction as a child and has not taken since then     Home Medications  Prior to Admission medications   Medication Sig Start Date End Date Taking? Authorizing Provider  acetaminophen (TYLENOL) 325 MG tablet Take 2 tablets (650 mg total) by mouth every 6 (six) hours as needed for moderate pain (headache). 11/26/22   Leroy Sea, MD  busPIRone (BUSPAR) 15 MG tablet Take 1 tablet (15 mg total) by mouth 3 (three) times daily. Patient taking differently: Take 15 mg by mouth 3 (three) times daily. Take as needed for anxiety 08/27/22   Park Pope, MD  ezetimibe (ZETIA) 10 MG tablet  Take 1 tablet (10 mg total) by mouth daily.  11/26/22   Leroy Sea, MD  finasteride (PROSCAR) 5 MG tablet Take 1 tablet (5 mg total) by mouth daily. 11/27/22   Leroy Sea, MD  midodrine (PROAMATINE) 10 MG tablet Take 1 tablet (10 mg total) by mouth every 8 (eight) hours. 11/26/22   Leroy Sea, MD  OLANZapine (ZYPREXA) 20 MG tablet Take 1 tablet (20 mg total) by mouth at bedtime. 08/27/22   Park Pope, MD  rosuvastatin (CRESTOR) 40 MG tablet Take 1 tablet (40 mg total) by mouth daily. 03/17/22   Sharlene Dory, NP  thiamine (VITAMIN B-1) 100 MG tablet Take 1 tablet (100 mg total) by mouth daily. 11/27/22   Leroy Sea, MD     Critical care time: 12 Minutes    JD Anselm Lis [facility] 12/16/2022, 8:18 AM  Please see [email or web address] for pager details.  From 7A-7P if no response, please call [phone]. After hours, please call ELink [phone].

## 2022-12-16 NOTE — Evaluation (Signed)
Physical Therapy Evaluation Patient Details Name: [name] MRN: [id number] DOB: 10-04-1950 Today's Date: 12/16/2022  History of Present Illness  [name] is a 72 y.o. male who presents from SNF for AMS. Found to have severe sepsis due to complicated UTI, acute metabolic encephalopathy and AKI.  PMH includes alcohol abuse, TBI-chronic dysarthria, HTN, HFpEF, schizophrenia, anxiety/depression, homeless, left shoulder fracture with malunion, several recent admissions   Clinical Impression  [name] is 72 y.o. male admitted with above HPI and diagnosis. Patient is currently limited by functional impairments below (see PT problem list). Patient presented from SNF and is per pt was ambulatory at baseline. Per chart review pt was ambulatory last admission and on 11/26/22 ambulated ~50' with min assist/guard. This date pt requires Total Assist for rolling in bed and was greatly limited by impaired communication, cognition, and significant weakness. Patient will benefit from continued skilled PT interventions to address impairments and progress independence with mobility. Acute PT will follow and progress as able.        Recommendations for follow up therapy are one component of a multi-disciplinary discharge planning process, led by the attending physician.  Recommendations may be updated based on patient status, additional functional criteria and insurance authorization.  Follow Up Recommendations Can patient physically be transported by private vehicle: No     Assistance Recommended at Discharge Frequent or constant Supervision/Assistance  Patient can return home with the following  Two people to help with walking and/or transfers;Two people to help with bathing/dressing/bathroom;Assistance with cooking/housework;Assistance with feeding;Direct supervision/assist for medications management;Direct supervision/assist for financial management;Assist for transportation;Help with stairs or  ramp for entrance    Equipment Recommendations None recommended by PT  Recommendations for Other Services       Functional Status Assessment Patient has had a recent decline in their functional status and/or demonstrates limited ability to make significant improvements in function in a reasonable and predictable amount of time     Precautions / Restrictions Precautions Precautions: Fall Precaution Comments: left buttock wound, incontinent BMs, flexiseal Restrictions Weight Bearing Restrictions: No      Mobility  Bed Mobility Overal bed mobility: Needs Assistance Bed Mobility: Rolling Rolling: Total assist, +2 for physical assistance, +2 for safety/equipment        Pt making attempt to initiate Lt UE reaching for rail, Rt UE more limited, ultimately Total assist reuqired total assist to roll Rt/Lt for clean up due to BM and flexiseal placed by RN during roll.       Transfers                        Ambulation/Gait                  Stairs            Wheelchair Mobility    Modified Rankin (Stroke Patients Only)       Balance Overall balance assessment: Needs assistance Sitting-balance support: Feet supported Sitting balance-Leahy Scale: Poor Sitting balance - Comments: posterior bias                                     Pertinent Vitals/Pain Pain Assessment Faces Pain Scale: Hurts little more Pain Location: L shoulder and penis Pain Descriptors / Indicators: Discomfort, Grimacing Pain Intervention(s): Limited activity within patient's tolerance, Monitored during session, Repositioned    Home Living Family/patient expects to be  discharged to:: Skilled nursing facility                   Additional Comments: [facility]    Prior Function Prior Level of Function : Patient poor historian/Family not available             Mobility Comments: questionable historian and impaired communication - pt reported he was  walking at SNF ADLs Comments: Unable to detail - anticipate pt required a lot of assist at SNF     Hand Dominance   Dominant Hand: Right    Extremity/Trunk Assessment   Upper Extremity Assessment Upper Extremity Assessment: Defer to OT evaluation RUE Deficits / Details: overall WFL, globally weak LUE Deficits / Details: malunion of shoulder, painful PROM. rigid elbow and wrist, hand ROM is [facility]    Lower Extremity Assessment Lower Extremity Assessment: Generalized weakness;RLE deficits/detail;LLE deficits/detail RLE Deficits / Details: 2-/5 for hip flex, knee flex/ext, 4-/5 for dorsiflexion, 3+/5 for plantar flexion LLE Deficits / Details: 2-/5 for hip flex, knee flex/ext, 4-/5 for dorsiflexion, 3+/5 for plantar flexion    Cervical / Trunk Assessment Cervical / Trunk Assessment: Kyphotic  Communication   Communication: Expressive difficulties  Cognition Arousal/Alertness: Awake/alert Behavior During Therapy: Flat affect Overall Cognitive Status: Difficult to assess                                 General Comments: follows most simple commands, attempting to initiate mobility and answer questions and verbalize needs however he is difficult to understand        General Comments General comments (skin integrity, edema, etc.): VSS on RA, BP trending soft. Foley site bleeding    Exercises     Assessment/Plan    PT Assessment Patient needs continued PT services  PT Problem List Decreased strength;Decreased mobility;Decreased activity tolerance;Decreased balance;Decreased knowledge of use of DME;Pain;Decreased safety awareness;Decreased cognition       PT Treatment Interventions DME instruction;Therapeutic activities;Gait training;Therapeutic exercise;Patient/family education;Functional mobility training;Neuromuscular re-education;Balance training    PT Goals (Current goals can be found in the Care Plan section)  Acute Rehab PT Goals Patient Stated Goal: none  stated PT Goal Formulation: Patient unable to participate in goal setting Time For Goal Achievement: 12/30/22 Potential to Achieve Goals: Fair    Frequency Min 3X/week     Co-evaluation               AM-PAC PT "6 Clicks" Mobility  Outcome Measure Help needed turning from your back to your side while in a flat bed without using bedrails?: Total Help needed moving from lying on your back to sitting on the side of a flat bed without using bedrails?: Total Help needed moving to and from a bed to a chair (including a wheelchair)?: Total Help needed standing up from a chair using your arms (e.g., wheelchair or bedside chair)?: Total Help needed to walk in hospital room?: Total Help needed climbing 3-5 steps with a railing? : Total 6 Click Score: 6    End of Session   Activity Tolerance: Patient limited by pain (limited by BM) Patient left: in bed;with call bell/phone within reach;with bed alarm set;with nursing/sitter in room;with SCD's reapplied Nurse Communication: Mobility status PT Visit Diagnosis: Other abnormalities of gait and mobility (R26.89);Muscle weakness (generalized) (M62.81)    Time: 8295-6213 PT Time Calculation (min) (ACUTE ONLY): 31 min   Charges:   PT Evaluation $PT Eval Moderate Complexity: 1 Mod  PT Treatments $Therapeutic Activity: 8-22 mins        Wynn Maudlin, DPT Acute Rehabilitation Services Office [phone]  12/16/22 4:50 PM

## 2022-12-16 NOTE — Progress Notes (Signed)
PHARMACY - PHYSICIAN COMMUNICATION CRITICAL VALUE ALERT - BLOOD CULTURE IDENTIFICATION (BCID)  [name] is an 72 y.o. male who presented to [facility] on 12/15/2022 with a chief complaint of altered mental status.   Assessment:  MRSA 3 of 3 bottles - likely source from indwelling catheter  Name of physician (or Provider) Contacted: Ogan  Current antibiotics: Vancomycin and cefepime  Changes to prescribed antibiotics recommended:  Recommendations declined by provider due to clinical deterioration  - Can likely D/C cefepime once patient stabilizes  Results for orders placed or performed during the hospital encounter of 12/15/22  Blood Culture ID Panel (Reflexed) (Collected: 12/15/2022 12:12 PM)  Result Value Ref Range   Enterococcus faecalis NOT DETECTED NOT DETECTED   Enterococcus Faecium NOT DETECTED NOT DETECTED   Listeria monocytogenes NOT DETECTED NOT DETECTED   Staphylococcus species DETECTED (A) NOT DETECTED   Staphylococcus aureus (BCID) DETECTED (A) NOT DETECTED   Staphylococcus epidermidis NOT DETECTED NOT DETECTED   Staphylococcus lugdunensis NOT DETECTED NOT DETECTED   Streptococcus species NOT DETECTED NOT DETECTED   Streptococcus agalactiae NOT DETECTED NOT DETECTED   Streptococcus pneumoniae NOT DETECTED NOT DETECTED   Streptococcus pyogenes NOT DETECTED NOT DETECTED   A.calcoaceticus-baumannii NOT DETECTED NOT DETECTED   Bacteroides fragilis NOT DETECTED NOT DETECTED   Enterobacterales NOT DETECTED NOT DETECTED   Enterobacter cloacae complex NOT DETECTED NOT DETECTED   Escherichia coli NOT DETECTED NOT DETECTED   Klebsiella aerogenes NOT DETECTED NOT DETECTED   Klebsiella oxytoca NOT DETECTED NOT DETECTED   Klebsiella pneumoniae NOT DETECTED NOT DETECTED   Proteus species NOT DETECTED NOT DETECTED   Salmonella species NOT DETECTED NOT DETECTED   Serratia marcescens NOT DETECTED NOT DETECTED   Haemophilus influenzae NOT DETECTED NOT DETECTED   Neisseria  meningitidis NOT DETECTED NOT DETECTED   Pseudomonas aeruginosa NOT DETECTED NOT DETECTED   Stenotrophomonas maltophilia NOT DETECTED NOT DETECTED   Candida albicans NOT DETECTED NOT DETECTED   Candida auris NOT DETECTED NOT DETECTED   Candida glabrata NOT DETECTED NOT DETECTED   Candida krusei NOT DETECTED NOT DETECTED   Candida parapsilosis NOT DETECTED NOT DETECTED   Candida tropicalis NOT DETECTED NOT DETECTED   Cryptococcus neoformans/gattii NOT DETECTED NOT DETECTED   Meth resistant mecA/C and MREJ DETECTED (A) NOT DETECTED    Toniann Fail [name] 12/16/2022  1:18 AM

## 2022-12-16 NOTE — Evaluation (Signed)
Clinical/Bedside Swallow Evaluation Patient Details  Name: [name] MRN: [id number] Date of Birth: 13-Aug-1950  Today's Date: 12/16/2022 Time: SLP Start Time (ACUTE ONLY): 4098 SLP Stop Time (ACUTE ONLY): 0940 SLP Time Calculation (min) (ACUTE ONLY): 15 min  Past Medical History:  Past Medical History:  Diagnosis Date   Acute urinary retention    Anxiety    Brain injury (HCC) 1975   Complication of anesthesia 08/29/13   difficulty due to large tongue, anterior larynx and limited opening   Difficult intubation 08/31/13   Head injury 1975   "Brain Stem Contusion"   Manic depression (HCC)    Pelvic fracture (HCC) 09/21/13   Inferior Pubic Ramus, Left Superior Ramus   Schizo affective schizophrenia (HCC)    Schizophrenia (HCC)    Past Surgical History:  Past Surgical History:  Procedure Laterality Date   COSMETIC SURGERY Left 1975   created new ear lobe   DRESSING CHANGE UNDER ANESTHESIA Left 08/31/2013   Procedure: DRESSING CHANGE UNDER ANESTHESIA for right ankle and left thigh;  Surgeon: Budd Palmer, MD;  Location: [facility] OR;  Service: Orthopedics;  Laterality: Left;   I & D EXTREMITY Bilateral 08/30/2013   Procedure: IRRIGATION AND DEBRIDEMENT with closure Left Thigh wound, Irrigation and debridement Right Ankle ;  Surgeon: Harvie Junior, MD;  Location: [facility] OR;  Service: Orthopedics;  Laterality: Bilateral;   I & D EXTREMITY Left 09/26/2013   Procedure: LEFT IRRIGATION AND DEBRIDEMENT Robby Sermon;  Surgeon: Budd Palmer, MD;  Location: [facility] OR;  Service: Orthopedics;  Laterality: Left;   I & D EXTREMITY Left 10/02/2013   Procedure: IRRIGATION AND DEBRIDEMENT EXTREMITY;  Surgeon: Budd Palmer, MD;  Location: [facility] OR;  Service: Orthopedics;  Laterality: Left;   INCISION AND DRAINAGE OF WOUND Left 09/21/2013   Procedure: IRRIGATION AND DEBRIDEMENT SOFT TISSUE WOUND WITH LARGE WOUND VAC PLACEMENT;  Surgeon: Budd Palmer, MD;  Location: [facility] OR;  Service: Orthopedics;   Laterality: Left;   MANDIBLE FRACTURE SURGERY  1975   SACRO-ILIAC PINNING Left 08/31/2013   Procedure: LEFT SACRO-ILIAC PINNING;  Surgeon: Budd Palmer, MD;  Location: [facility] OR;  Service: Orthopedics;  Laterality: Left;   SKIN GRAFT     SKIN GRAFT Left 10/31/2013   left thigh      DR HANDY    SKIN SPLIT GRAFT Left 10/02/2013   Procedure: SKIN GRAFT SPLIT THICKNESS;  Surgeon: Budd Palmer, MD;  Location: [facility] OR;  Service: Orthopedics;  Laterality: Left;   SKIN SPLIT GRAFT Left 10/31/2013   Procedure: LEFT THIGH SKIN GRAFT SPLIT THICKNESS;  Surgeon: Budd Palmer, MD;  Location: [facility] OR;  Service: Orthopedics;  Laterality: Left;   HPI:  Patient is a 72 y.o. male with PMH: anxiety, TBI, manic depression, schizo affective schizophrenia, dysphagia, chronic dysarthria. He was admitted last month and at time of discharge, recommended diet from SLP (following MBSx2) was Regular solids, honey thick liquids. Currently, patient presented to the hospital on 12/15/22 from SNF for evaluation of AMS. In ED, patient encephalopathic, febrile at 104F, tachycardic and had purulent urine in his Foley catheter, hypotensive. He was admitted with sepsis and is NPO awaiting SLP swallow evaluation.    Assessment / Plan / Recommendation  Clinical Impression  Patient presents with clinical s/s of dysphagia as per this bedside swallow evaluation. Patient has h/o dysphagia as per SLP swallow evaluations (including 2 MBS) last month. At that time, patient's recommended diet was regular solids and honey thick liquids.  During today's evaluation, patient awake, alert, severe-profound dysarthria such that only minimal speech understood. Tongue very flaccid and base of tongue coming into contact with oropharynx, resulting in difficulty breathing through mouth. SLP cleared mild amount of dried secretions in oral cavity, primarily on teeth and along gumline. Of note, one left molar appeared loose. Patient able to self-manage cup of honey  thick liquids, exhibiting suspected swallow initiation delay and observed delayed throat clearing. He exhibited the same symptoms with puree (applesauce) via spoon bites. SLP recommending continue NPO, will proceed with MBS this date. SLP Visit Diagnosis: Dysphagia, oropharyngeal phase (R13.12)    Aspiration Risk  Severe aspiration risk    Diet Recommendation NPO   Medication Administration: Via alternative means    Other  Recommendations Oral Care Recommendations: Oral care QID;Staff/trained caregiver to provide oral care    Recommendations for follow up therapy are one component of a multi-disciplinary discharge planning process, led by the attending physician.  Recommendations may be updated based on patient status, additional functional criteria and insurance authorization.  Follow up Recommendations Skilled nursing-short term rehab (<3 hours/day)      Assistance Recommended at Discharge    Functional Status Assessment Patient has had a recent decline in their functional status and demonstrates the ability to make significant improvements in function in a reasonable and predictable amount of time.  Frequency and Duration min 2x/week  2 weeks       Prognosis Prognosis for improved oropharyngeal function: Fair Barriers to Reach Goals: Time post onset;Severity of deficits;Cognitive deficits      Swallow Study   General Date of Onset: 12/15/22 HPI: Patient is a 72 y.o. male with PMH: anxiety, TBI, manic depression, schizo affective schizophrenia, dysphagia, chronic dysarthria. He was admitted last month and at time of discharge, recommended diet from SLP (following MBSx2) was Regular solids, honey thick liquids. Currently, patient presented to the hospital on 12/15/22 from SNF for evaluation of AMS. In ED, patient encephalopathic, febrile at 104F, tachycardic and had purulent urine in his Foley catheter, hypotensive. He was admitted with sepsis and is NPO awaiting SLP swallow  evaluation. Type of Study: Bedside Swallow Evaluation Previous Swallow Assessment: MBSx2 last month Diet Prior to this Study: NPO Temperature Spikes Noted: Yes (100.6) Respiratory Status: Nasal cannula History of Recent Intubation: No Behavior/Cognition: Alert;Cooperative;Pleasant mood Oral Cavity Assessment: Dry;Dried secretions Oral Care Completed by SLP: Yes Oral Cavity - Dentition: Adequate natural dentition;Missing dentition Vision: Functional for self-feeding Self-Feeding Abilities: Able to feed self;Needs set up;Needs assist Patient Positioning: Upright in bed Baseline Vocal Quality: Other (comment) (speech is severe-profoundly dysarthric) Volitional Cough: Congested Volitional Swallow: Able to elicit    Oral/Motor/Sensory Function Overall Oral Motor/Sensory Function: Severe impairment Lingual ROM: Reduced left;Reduced right Lingual Symmetry: Within Functional Limits Lingual Strength: Reduced   Ice Chips Ice chips: Not tested   Thin Liquid Thin Liquid: Not tested    Nectar Thick Nectar Thick Liquid: Not tested   Honey Thick Honey Thick Liquid: Impaired Presentation: Cup Pharyngeal Phase Impairments: Suspected delayed Swallow;Throat Clearing - Delayed   Puree Puree: Impaired Pharyngeal Phase Impairments: Suspected delayed Swallow;Throat Clearing - Delayed   Solid     Solid: Not tested     Angela Nevin, MA, CCC-SLP Speech Therapy

## 2022-12-16 NOTE — Progress Notes (Signed)
Echocardiogram 2D Echocardiogram has been performed.  Warren Lacy Zakara Parkey RDCS 12/16/2022, 11:18 AM

## 2022-12-16 NOTE — Evaluation (Signed)
Occupational Therapy Evaluation Patient Details Name: [name] MRN: [id number] DOB: 29-Apr-1951 Today's Date: 12/16/2022   History of Present Illness [name] is a 72 y.o. male who presents from SNF for AMS. Found to have severe sepsis due to complicated UTI, acute metabolic encephalopathy and AKI.  PMH includes alcohol abuse, TBI-chronic dysarthria, HTN, HFpEF, schizophrenia, anxiety/depression, homeless, left shoulder fracture with malunion, several recent admissions   Clinical Impression   [name] was evaluated s/p the above admission list. He presents from SNF with unknown functional level, pt reports he was walking however he is a questionable historian. Upon evaluation he required total A +2 for bed mobility and max A for sitting EOB balance. Overall he also requires total A for LB ADLs at bed level and max A for UB ADLs. He is limited by impaired communication, painful L shoulder with decreased AROM, weakness, poor balance, decreased activity tolerance and impaired cognition. Pt will benefit from continued acute OT services and skilled inpatient follow up therapy, <3 hours/day.       Recommendations for follow up therapy are one component of a multi-disciplinary discharge planning process, led by the attending physician.  Recommendations may be updated based on patient status, additional functional criteria and insurance authorization.   Assistance Recommended at Discharge Frequent or constant Supervision/Assistance  Patient can return home with the following A lot of help with walking and/or transfers;Two people to help with walking and/or transfers;A lot of help with bathing/dressing/bathroom;Two people to help with bathing/dressing/bathroom;Assistance with cooking/housework;Assistance with feeding;Direct supervision/assist for medications management;Direct supervision/assist for financial management;Assist for transportation;Help with stairs or ramp for entrance     Functional Status Assessment  Patient has had a recent decline in their functional status and demonstrates the ability to make significant improvements in function in a reasonable and predictable amount of time.  Equipment Recommendations  None recommended by OT       Precautions / Restrictions Precautions Precautions: Fall Precaution Comments: left buttock wound, incontinent BMs Restrictions Weight Bearing Restrictions: No      Mobility Bed Mobility Overal bed mobility: Needs Assistance Bed Mobility: Sidelying to Sit, Rolling, Sit to Supine Rolling: Total assist, +2 for physical assistance, +2 for safety/equipment Sidelying to sit: Total assist, +2 for physical assistance, +2 for safety/equipment   Sit to supine: Total assist, +2 for physical assistance, +2 for safety/equipment        Transfers                   General transfer comment: unable to attempt      Balance Overall balance assessment: Needs assistance Sitting-balance support: Feet supported Sitting balance-Leahy Scale: Poor Sitting balance - Comments: posterior bias               ADL either performed or assessed with clinical judgement   ADL Overall ADL's : Needs assistance/impaired Eating/Feeding: Maximal assistance   Grooming: Maximal assistance   Upper Body Bathing: Maximal assistance   Lower Body Bathing: Total assistance;+2 for safety/equipment;+2 for physical assistance;Bed level   Upper Body Dressing : Maximal assistance;Sitting   Lower Body Dressing: Total assistance;+2 for safety/equipment;+2 for physical assistance;Bed level   Toilet Transfer: Total assistance;+2 for physical assistance;+2 for safety/equipment   Toileting- Clothing Manipulation and Hygiene: Total assistance;+2 for safety/equipment;+2 for physical assistance;Bed level       Functional mobility during ADLs: Total assistance;+2 for physical assistance;Caregiver able to provide necessary level of assistance  (bed mobility) General ADL Comments: posterior bias in sitting, impaired communicaiton,  poor sitting balance, pain, weakness, limite L shoulder AROM     Vision Baseline Vision/History: 0 No visual deficits Vision Assessment?: No apparent visual deficits     Perception Perception Perception Tested?: No   Praxis Praxis Praxis tested?: Not tested    Pertinent Vitals/Pain Pain Assessment Pain Assessment: Faces Faces Pain Scale: Hurts little more Pain Location: L shoulder and penis Pain Descriptors / Indicators: Discomfort, Grimacing Pain Intervention(s): Limited activity within patient's tolerance, Monitored during session     Hand Dominance Right   Extremity/Trunk Assessment Upper Extremity Assessment Upper Extremity Assessment: LUE deficits/detail;RUE deficits/detail RUE Deficits / Details: overall WFL, globally weak LUE Deficits / Details: malunion of shoulder, painful PROM. rigid elbow and wrist, hand ROM is [facility]   Lower Extremity Assessment Lower Extremity Assessment: Defer to PT evaluation   Cervical / Trunk Assessment Cervical / Trunk Assessment: Kyphotic   Communication Communication Communication: Expressive difficulties   Cognition Arousal/Alertness: Awake/alert Behavior During Therapy: Flat affect Overall Cognitive Status: Difficult to assess                                 General Comments: follows most simple commands, attempting to answer questions and verbalize needs however he is difficult to understand     General Comments  VSS on RA, BP trending soft. Foley site bleeding     Home Living Family/patient expects to be discharged to:: Skilled nursing facility                                 Additional Comments: [name]      Prior Functioning/Environment Prior Level of Function : Patient poor historian/Family not available             Mobility Comments: questionable historian and impaired communication - pt  reported he was walking at SNF ADLs Comments: Unable to detail - anticipate pt required a lot of assist at SNF        OT Problem List: Decreased strength;Decreased range of motion;Impaired balance (sitting and/or standing);Decreased activity tolerance;Decreased safety awareness;Impaired UE functional use;Pain;Increased edema      OT Treatment/Interventions: Self-care/ADL training;Therapeutic exercise;DME and/or AE instruction;Therapeutic activities;Patient/family education;Balance training    OT Goals(Current goals can be found in the care plan section) Acute Rehab OT Goals Patient Stated Goal: to rest OT Goal Formulation: With patient Time For Goal Achievement: 11/26/22 Potential to Achieve Goals: Good ADL Goals Pt Will Perform Grooming: with set-up Pt Will Perform Upper Body Dressing: with mod assist;sitting Pt Will Transfer to Toilet: with mod assist;stand pivot transfer;bedside commode Additional ADL Goal #1: Pt will complete bed mobility with mod A as a precursor to ADLs  OT Frequency: Min 2X/week       AM-PAC OT "6 Clicks" Daily Activity     Outcome Measure Help from another person eating meals?: A Lot Help from another person taking care of personal grooming?: A Lot Help from another person toileting, which includes using toliet, bedpan, or urinal?: Total Help from another person bathing (including washing, rinsing, drying)?: Total Help from another person to put on and taking off regular upper body clothing?: A Lot Help from another person to put on and taking off regular lower body clothing?: Total 6 Click Score: 9   End of Session Nurse Communication: Mobility status (foley bleeding)  Activity Tolerance: Patient limited by pain;Patient tolerated treatment well Patient left: in  bed;with call bell/phone within reach;with bed alarm set  OT Visit Diagnosis: Unsteadiness on feet (R26.81);Other abnormalities of gait and mobility (R26.89);Muscle weakness (generalized)  (M62.81);History of falling (Z91.81);Repeated falls (R29.6);Other symptoms and signs involving cognitive function;Adult, failure to thrive (R62.7);Pain                Time: 1255-1314 OT Time Calculation (min): 19 min Charges:  OT General Charges $OT Visit: 1 Visit OT Evaluation $OT Eval Moderate Complexity: 1 Mod  Derenda Mis, OTR/L Acute Rehabilitation Services Office [phone] Secure Chat Communication Preferred   Donia Pounds 12/16/2022, 1:45 PM

## 2022-12-16 NOTE — Progress Notes (Signed)
Arterial line placement attempted by RT x2. Positive blood return and catheter able to thread but no blood return. Upon removal of catheter, blood clot present each time. CCM MD and CCM PA notified and will attempt to place line. RT will continue to monitor and be available as needed.

## 2022-12-16 NOTE — Procedures (Signed)
Arterial Catheter Insertion Procedure Note  SULIEMAN FAYETTE  [id number]  February 18, 1951  Date:12/16/22  Time:5:23 PM    Provider Performing: Lidia Collum    Procedure: Insertion of Arterial Line (95621) with US guidance (30865)   Indication(s) Blood pressure monitoring and/or need for frequent ABGs  Consent Unable to obtain consent due to emergent nature of procedure.  Anesthesia None   Time Out Verified patient identification, verified procedure, site/side was marked, verified correct patient position, special equipment/implants available, medications/allergies/relevant history reviewed, required imaging and test results available.   Sterile Technique Maximal sterile technique including full sterile barrier drape, hand hygiene, sterile gown, sterile gloves, mask, hair covering, sterile ultrasound probe cover (if used).   Procedure Description Area of catheter insertion was cleaned with chlorhexidine and draped in sterile fashion. With real-time ultrasound guidance an arterial catheter was placed into the left radial artery.  Appropriate arterial tracings confirmed on monitor.     Complications/Tolerance None; patient tolerated the procedure well.   EBL Minimal   Specimen(s) None  JD Anselm Lis [facility] 12/16/2022, 5:23 PM  Please see [email or web address] for pager details.  From 7A-7P if no response, please call [phone]. After hours, please call ELink [phone].

## 2022-12-16 NOTE — Progress Notes (Signed)
Pharmacy Antibiotic Note  [name] is a 72 y.o. male admitted on 12/15/2022 with sepsis. He presented with altered mental status. He had a chronic foley in place from [place]. Foley catheter has been exchanged. Pharmacy has been consulted for vancomycin and cefepime dosing.  WBC trending down to 14.4 Fever curve improving with Temp down to 100.8 F Scr trending down from 1.77 to 1.3  Plan: Change Vancomycin to 1500mg  IV q24 hours (eAUC~448)     > Goal AUC 400-550    > Check vancomycin levels at steady state  Change Cefepime 2g IV q8h Monitor daily CBC, temp, SCr, and for clinical signs of improvement  F/u cultures and de-escalate antibiotics as able   Height: 6\' 2"  (188 cm) Weight: 83.9 kg (184 lb 15.5 oz) IBW/kg (Calculated) : 82.2  Temp (24hrs), Avg:102 F (38.9 C), Min:97.8 F (36.6 C), Max:104.2 F (40.1 C)  Recent Labs  Lab 12/15/22 1154 12/15/22 1217 12/15/22 1444 12/15/22 1838 12/15/22 2213 12/16/22 0533  WBC 18.0*  --   --   --   --  14.4*  CREATININE 1.77*  --   --   --   --  1.30*  LATICACIDVEN  --  4.0* 2.0* 2.3* 1.9  --      Estimated Creatinine Clearance: 61.5 mL/min (A) (by C-G formula based on SCr of 1.3 mg/dL (H)).    Allergies  Allergen Reactions   Penicillins Other (See Comments)    Unsure of reaction, had allergic reaction as a child and has not taken since then   Antimicrobials during this admission: Cefepime 5/14 >>  Vancomycin 5/14 >>  Metronidazole 5/14 x1  Microbiology results: 5/14 BCID: MRSA 5/14 Bld cx: 4/4 Staph aureus (sens pending) 5/14 MRSA PCR: detected 5/14 Ur cx: sent  Thank you for allowing pharmacy to be a part of this patient's care.  Wilburn Cornelia, PharmD, BCPS Clinical Pharmacist 12/16/2022 8:53 AM   Please refer to AMION for pharmacy phone number

## 2022-12-16 NOTE — Consult Note (Addendum)
WOC Nurse Consult Note:this wound consult is performed remotely. 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 May lift foam dressings to replace Silver daily.  Change foam dressing q3 days and prn soiling.   Patient should remain on low air loss mattress throughout hospitalization for moisture management and pressure redistribution.  WOC team will not follow patient at this time. Re-consult if further needs arise.   Thank you,    Priscella Mann MSN, RN-BC, [employer] [phone]

## 2022-12-17 ENCOUNTER — Inpatient Hospital Stay (HOSPITAL_COMMUNITY): Payer: Medicare Other

## 2022-12-17 DIAGNOSIS — R6520 Severe sepsis without septic shock: Secondary | ICD-10-CM | POA: Diagnosis not present

## 2022-12-17 DIAGNOSIS — R739 Hyperglycemia, unspecified: Secondary | ICD-10-CM

## 2022-12-17 DIAGNOSIS — G9340 Encephalopathy, unspecified: Secondary | ICD-10-CM

## 2022-12-17 DIAGNOSIS — E440 Moderate protein-calorie malnutrition: Secondary | ICD-10-CM | POA: Insufficient documentation

## 2022-12-17 DIAGNOSIS — N179 Acute kidney failure, unspecified: Secondary | ICD-10-CM | POA: Diagnosis not present

## 2022-12-17 DIAGNOSIS — R6521 Severe sepsis with septic shock: Secondary | ICD-10-CM | POA: Diagnosis not present

## 2022-12-17 DIAGNOSIS — A419 Sepsis, unspecified organism: Secondary | ICD-10-CM | POA: Diagnosis not present

## 2022-12-17 LAB — HEMOGLOBIN A1C
Hgb A1c MFr Bld: 5.9 % — ABNORMAL HIGH (ref 4.8–5.6)
Mean Plasma Glucose: 122.63 mg/dL

## 2022-12-17 LAB — BASIC METABOLIC PANEL
Anion gap: 11 (ref 5–15)
Anion gap: 9 (ref 5–15)
BUN: 18 mg/dL (ref 8–23)
BUN: 24 mg/dL — ABNORMAL HIGH (ref 8–23)
CO2: 26 mmol/L (ref 22–32)
CO2: 27 mmol/L (ref 22–32)
Calcium: 8 mg/dL — ABNORMAL LOW (ref 8.9–10.3)
Calcium: 7.8 mg/dL — ABNORMAL LOW (ref 8.9–10.3)
Chloride: 95 mmol/L — ABNORMAL LOW (ref 98–111)
Chloride: 97 mmol/L — ABNORMAL LOW (ref 98–111)
Creatinine, Ser: 0.98 mg/dL (ref 0.61–1.24)
Creatinine, Ser: 0.81 mg/dL (ref 0.61–1.24)
GFR, Estimated: >60 mL/min (ref >60)
GFR, Estimated: >60 mL/min (ref >60)
Glucose, Bld: 308 mg/dL — ABNORMAL HIGH (ref 70–99)
Glucose, Bld: 188 mg/dL — ABNORMAL HIGH (ref 70–99)
Potassium: 2.9 mmol/L — ABNORMAL LOW (ref 3.5–5.1)
Potassium: 3.4 mmol/L — ABNORMAL LOW (ref 3.5–5.1)
Sodium: 132 mmol/L — ABNORMAL LOW (ref 135–145)
Sodium: 133 mmol/L — ABNORMAL LOW (ref 135–145)

## 2022-12-17 LAB — URINE CULTURE: Culture: >=100000 — AB

## 2022-12-17 LAB — GLUCOSE, CAPILLARY
Glucose-Capillary: 201 mg/dL — ABNORMAL HIGH (ref 70–99)
Glucose-Capillary: 187 mg/dL — ABNORMAL HIGH (ref 70–99)
Glucose-Capillary: 163 mg/dL — ABNORMAL HIGH (ref 70–99)
Glucose-Capillary: 146 mg/dL — ABNORMAL HIGH (ref 70–99)
Glucose-Capillary: 158 mg/dL — ABNORMAL HIGH (ref 70–99)
Glucose-Capillary: 122 mg/dL — ABNORMAL HIGH (ref 70–99)

## 2022-12-17 LAB — PHOSPHORUS
Phosphorus: 2.1 mg/dL — ABNORMAL LOW (ref 2.5–4.6)
Phosphorus: 1.5 mg/dL — ABNORMAL LOW (ref 2.5–4.6)

## 2022-12-17 LAB — CBC
HCT: 30.7 % — ABNORMAL LOW (ref 39.0–52.0)
Hemoglobin: 10.1 g/dL — ABNORMAL LOW (ref 13.0–17.0)
MCH: 28.7 pg (ref 26.0–34.0)
MCHC: 32.9 g/dL (ref 30.0–36.0)
MCV: 87.2 fL (ref 80.0–100.0)
Platelets: 98 10*3/uL — ABNORMAL LOW (ref 150–400)
RBC: 3.52 MIL/uL — ABNORMAL LOW (ref 4.22–5.81)
RDW: 15.2 % (ref 11.5–15.5)
WBC: 15.2 10*3/uL — ABNORMAL HIGH (ref 4.0–10.5)
nRBC: 0 % (ref 0.0–0.2)

## 2022-12-17 LAB — CULTURE, BLOOD (ROUTINE X 2): Special Requests: ADEQUATE

## 2022-12-17 LAB — MAGNESIUM
Magnesium: 1.9 mg/dL (ref 1.7–2.4)
Magnesium: 2.1 mg/dL (ref 1.7–2.4)

## 2022-12-17 MED ORDER — POTASSIUM CHLORIDE 10 MEQ/50ML IV SOLN
10.0000 meq | INTRAVENOUS | Status: AC
  Administered 2022-12-17 (×2): 10 meq via INTRAVENOUS
  Filled 2022-12-17 (×2): qty 50

## 2022-12-17 MED ORDER — VANCOMYCIN HCL IN DEXTROSE 1-5 GM/200ML-% IV SOLN
1000.0000 mg | Freq: Two times a day (BID) | INTRAVENOUS | Status: DC
  Administered 2022-12-17 – 2022-12-19 (×5): 1000 mg via INTRAVENOUS
  Filled 2022-12-17 (×6): qty 200

## 2022-12-17 MED ORDER — INSULIN ASPART 100 UNIT/ML IJ SOLN
0.0000 [IU] | INTRAMUSCULAR | Status: DC
  Administered 2022-12-17: 3 [IU] via SUBCUTANEOUS
  Administered 2022-12-17: 2 [IU] via SUBCUTANEOUS

## 2022-12-17 MED ORDER — POTASSIUM CHLORIDE 10 MEQ/50ML IV SOLN: 10.0000 meq | INTRAVENOUS | Status: DC

## 2022-12-17 MED ORDER — SODIUM CHLORIDE 0.9 % IV SOLN
2.0000 g | INTRAVENOUS | Status: DC
  Administered 2022-12-17: 2 g via INTRAVENOUS
  Filled 2022-12-17: qty 20

## 2022-12-17 MED ORDER — INSULIN ASPART 100 UNIT/ML IJ SOLN
0.0000 [IU] | INTRAMUSCULAR | Status: DC
  Administered 2022-12-17 (×2): 3 [IU] via SUBCUTANEOUS
  Administered 2022-12-17 (×2): 2 [IU] via SUBCUTANEOUS
  Administered 2022-12-18: 3 [IU] via SUBCUTANEOUS
  Administered 2022-12-18 (×2): 2 [IU] via SUBCUTANEOUS
  Administered 2022-12-18 (×2): 3 [IU] via SUBCUTANEOUS
  Administered 2022-12-18 – 2022-12-19 (×2): 2 [IU] via SUBCUTANEOUS
  Administered 2022-12-19: 3 [IU] via SUBCUTANEOUS
  Administered 2022-12-19: 2 [IU] via SUBCUTANEOUS
  Administered 2022-12-19 (×2): 3 [IU] via SUBCUTANEOUS
  Administered 2022-12-19: 2 [IU] via SUBCUTANEOUS
  Administered 2022-12-20 (×2): 3 [IU] via SUBCUTANEOUS
  Administered 2022-12-20: 5 [IU] via SUBCUTANEOUS
  Administered 2022-12-21: 3 [IU] via SUBCUTANEOUS
  Administered 2022-12-21 (×2): 2 [IU] via SUBCUTANEOUS
  Administered 2022-12-21: 3 [IU] via SUBCUTANEOUS
  Administered 2022-12-21: 2 [IU] via SUBCUTANEOUS
  Administered 2022-12-21: 5 [IU] via SUBCUTANEOUS
  Administered 2022-12-21 – 2022-12-22 (×3): 3 [IU] via SUBCUTANEOUS
  Administered 2022-12-22: 5 [IU] via SUBCUTANEOUS
  Administered 2022-12-22: 3 [IU] via SUBCUTANEOUS
  Administered 2022-12-22 (×2): 5 [IU] via SUBCUTANEOUS
  Administered 2022-12-23: 2 [IU] via SUBCUTANEOUS
  Administered 2022-12-23: 3 [IU] via SUBCUTANEOUS

## 2022-12-17 MED ORDER — MIDODRINE HCL 5 MG PO TABS
20.0000 mg | ORAL_TABLET | Freq: Three times a day (TID) | ORAL | Status: DC
  Administered 2022-12-17 – 2022-12-18 (×3): 20 mg via ORAL
  Filled 2022-12-17 (×3): qty 4

## 2022-12-17 MED ORDER — POTASSIUM PHOSPHATES 15 MMOLE/5ML IV SOLN
15.0000 mmol | Freq: Once | INTRAVENOUS | Status: AC
  Administered 2022-12-17: 15 mmol via INTRAVENOUS
  Filled 2022-12-17: qty 5

## 2022-12-17 MED ORDER — IOHEXOL 350 MG/ML SOLN
75.0000 mL | Freq: Once | INTRAVENOUS | Status: AC | PRN
  Administered 2022-12-17: 75 mL via INTRAVENOUS

## 2022-12-17 MED ORDER — POTASSIUM CHLORIDE 20 MEQ PO PACK
20.0000 meq | PACK | Freq: Once | ORAL | Status: AC
  Administered 2022-12-17: 20 meq
  Filled 2022-12-17: qty 1

## 2022-12-17 MED ORDER — POTASSIUM CHLORIDE 20 MEQ PO PACK
40.0000 meq | PACK | Freq: Once | ORAL | Status: AC
  Administered 2022-12-17: 40 meq
  Filled 2022-12-17: qty 2

## 2022-12-17 MED ORDER — MAGNESIUM SULFATE 2 GM/50ML IV SOLN
2.0000 g | Freq: Once | INTRAVENOUS | Status: AC
  Administered 2022-12-17: 2 g via INTRAVENOUS
  Filled 2022-12-17: qty 50

## 2022-12-17 NOTE — Progress Notes (Signed)
Pharmacy Antibiotic Note  [name] is a 72 y.o. male admitted on 12/15/2022 with MRSA bacteremia. Patient has a chronic foley catheter that has been exchanged and sacral wounds. WBC 15.2 today and fever curve trending down. SCr improved today to around 1 with CrCl ~ 80 ml/min.     Plan: Change Vancomycin to 1000 mg every 12 hours (Predicted AUC 454 with Scr 1 and Vd 0.72)    > Goal AUC 400-550    > Check vancomycin levels at steady state  Monitor renal function, blood cultures and clinical improvement  Height: 6\' 2"  (188 cm) Weight: 86.4 kg (190 lb 7.6 oz) IBW/kg (Calculated) : 82.2  Temp (24hrs), Avg:100.4 F (38 C), Min:98.6 F (37 C), Max:101.1 F (38.4 C)  Recent Labs  Lab 12/15/22 1154 12/15/22 1217 12/15/22 1444 12/15/22 1838 12/15/22 2213 12/16/22 0533 12/16/22 1519 12/17/22 0305  WBC 18.0*  --   --   --   --  14.4*  --  15.2*  CREATININE 1.77*  --   --   --   --  1.30* 1.10 0.98  LATICACIDVEN  --  4.0* 2.0* 2.3* 1.9  --   --   --      Estimated Creatinine Clearance: 81.5 mL/min (by C-G formula based on SCr of 0.98 mg/dL).    Allergies  Allergen Reactions   Penicillins Other (See Comments)    Unsure of reaction, had allergic reaction as a child and has not taken since then   Antimicrobials during this admission: Cefepime 5/14 >> 5/15 Vancomycin 5/14 >>  Metronidazole 5/14 x1  Microbiology results: 5/15 Blood cx x 2:  5/14 BCID: MRSA 5/14 Bld cx: 4/4 Staph aureus (sens pending) 5/14 MRSA PCR: detected 5/14 Ur cx: sent  Thank you for allowing pharmacy to be a part of this patient's care.  Sharin Mons, PharmD, BCPS, BCIDP Infectious Diseases Clinical Pharmacist Phone: [phone] 12/17/2022 8:02 AM   Please refer to AMION for pharmacy phone number

## 2022-12-17 NOTE — Progress Notes (Addendum)
NAME:  ARHAN CERVANTES, MRN:  [id number], DOB:  1951/05/11, LOS: 2 ADMISSION DATE:  12/15/2022, CONSULTATION DATE:  12/15/2022 REFERRING MD:  Dr. Jerral Ralph, CHIEF COMPLAINT:  Sepsis   History of Present Illness:  Mr. Rial Sabir. Minish is a 72 year old male with past medical history of TBI with chronic dysarthria, schizophrenia, and recent hospitalization for sepsis complicated by hypercarbia who presented to the ED from a skilled nursing facility for evaluation of altered mental status. Concern at the nursing home was for generalized weakness and worsening confusion. Attempted to call nursing facility however did not get an answer.   In the ED, he was febrile to 104, tachycardic to 134, hypotensive, and was found to have purulent urine in his foley catheter. He was given 3L of IV fluids, as well as started on empiric antibiotics. Due to persistently low MAPs after 3L, PCCM was consulted for evaluation for pressor support.   Pertinent  Medical History  HTN Schizophrenia  Anxiety/Depression TBI - chronic dysarthria  Recent hospitalization from 4/9-4/25 for sepsis complicated by hypercarbia  Significant Hospital Events: Including procedures, antibiotic start and stop dates in addition to other pertinent events   5/14 - Started on Cefepime and Vancomycin  5/14 - Started on Low Dose Levo  5/15- bcx2 growing MRSA; on 20 levo and 0.03 vaso; picc line placed  Interim History / Subjective:   Echo yesterday 5/15 w/ lvef 55-60%; severely enlarged right ventricle; no evidence of endocardidtis  A-line placed yesterday but was accidentally pulled out by patient overnight Remains on 20 levo and 0.03 vaso Patient Aox3 Glucose range 146-308  Objective   Blood pressure 107/67, pulse 90, temperature 98.8 F (37.1 C), resp. rate (!) 24, height 6\' 2"  (1.88 m), weight 86.4 kg, SpO2 98 %.        Intake/Output Summary (Last 24 hours) at 12/17/2022 0721 Last data filed at 12/17/2022 0600 Gross per 24 hour   Intake 3544.79 ml  Output 2450 ml  Net 1094.79 ml    Filed Weights   12/15/22 1740 12/16/22 0500 12/17/22 0500  Weight: 83.7 kg 83.9 kg 86.4 kg    Examination: General: chronically ill appearing male in NAD HEENT: MM pink/dry; [place] in place Neuro: AO; MAE CV: s1s2, RRR, no m/r/g PULM:  dim clear BS bilaterally; [place] 3L GI: soft, bsx4 active  Extremities: warm/dry, no edema; sacral stage II ulcer poa with no erythema/drainage and dressing in place Skin: no rashes or lesions    Resolved Hospital Problem list   NA  Assessment & Plan:  Septic shock Complicated UTI from chronic indwelling foley MRSA bacteremia P: -cont levo and vaso for map goal >65 -consider placing a-line again if not able to wean -ID consulted; appreciate recs; cont vanc -will add back rocephin for possible UTI while urine culture pending -f/u cultures/susceptibilities -trend wbc/fever curve -increase midodrine 20 q8 -will need TEE when more stable  Acute Metabolic Encephalopathy  -CT head no acute abnormality P: -mental status appears to be at baseline -cont treatment for sepsis as above -limit sedating meds  AKI Hypokalemia - CT Abdomen without hydronephrosis  P: -K repleted w/ mag this am -Trend BMP / urinary output -Replace electrolytes as indicated -Avoid nephrotoxic agents, ensure adequate renal perfusion  Hyperglycemia -A1c 5/16 5.9 P: -cont ssi and cbg monitoring  Chronic Indwelling Foley Cath  Failed voiding trial with prior hospitalization - Foley Catheter inserted at that time for outpatient urology evaluation  P: -foley changed out on admission -some  bleeding appreciated 5/15 likely from foley trauma; cont to hold dvt ppx heparin subcu and consider resuming tomorrow -monitor UOP -hold finasteride for now  Chronic HFpEF  Last echo showed grade 1 diastolic dysfunction, with an EF of 60-65%. Does not seem volume overloaded on exam.; repeat echo 5/15 lvef 55-60%; severely  dilated RV P: -daily weights; strict I/O's  Sacral Ulcer Wound care as necessary. Was septic from this during previous hospitalization. Currently non-bleeding, no discharge present, appears to be healing well P: -woc consult  History of EtOH use  Ethanol wnl P: -resume home thiamine  Schizophrenia/PTSD  P: -cont home zyprexa and buspar   Anemia Thrombocytopenia -likely sepsis related P: -trend cbc    Best Practice (right click and "Reselect all SmartList Selections" daily)   Diet/type: full liquids ; slp following DVT prophylaxis: SCD; consider switching back to heparin sub cu tomorrow if no more bleeding from foley GI prophylaxis: N/A Lines: N/A Foley:  Yes, and it is still needed Code Status:  full code Last date of multidisciplinary goals of care discussion: 5/16 attempted to contact friend [name] over phone but no answer  Labs   CBC: Recent Labs  Lab 12/15/22 1154 12/16/22 0533 12/17/22 0305  WBC 18.0* 14.4* 15.2*  NEUTROABS 16.2*  --   --   HGB 13.1 10.5* 10.1*  HCT 41.2 32.2* 30.7*  MCV 91.8 87.5 87.2  PLT 160 117* 98*     Basic Metabolic Panel: Recent Labs  Lab 12/15/22 1154 12/16/22 0533 12/16/22 1519 12/16/22 1537 12/17/22 0305  NA 134* 134* 134*  --  132*  K 3.8 3.0* 3.8  --  2.9*  CL 96* 100 98  --  95*  CO2 21* 25 22  --  26  GLUCOSE 168* 136* 145*  --  308*  BUN 26* 21 19  --  18  CREATININE 1.77* 1.30* 1.10  --  0.98  CALCIUM 8.3* 8.0* 8.0*  --  8.0*  MG  --  1.8  --  2.1 1.9  PHOS  --   --   --  2.8 2.1*    GFR: Estimated Creatinine Clearance: 81.5 mL/min (by C-G formula based on SCr of 0.98 mg/dL). Recent Labs  Lab 12/15/22 1154 12/15/22 1217 12/15/22 1444 12/15/22 1838 12/15/22 2213 12/16/22 0533 12/17/22 0305  PROCALCITON  --   --   --  10.18  --   --   --   WBC 18.0*  --   --   --   --  14.4* 15.2*  LATICACIDVEN  --  4.0* 2.0* 2.3* 1.9  --   --      Liver Function Tests: Recent Labs  Lab 12/15/22 1154  12/16/22 0533  AST 72* 78*  ALT 43 40  ALKPHOS 70 58  BILITOT 2.0* 1.3*  PROT 6.6 5.3*  ALBUMIN 2.6* 1.9*    No results for input(s): "LIPASE", "AMYLASE" in the last 168 hours. Recent Labs  Lab 12/15/22 2213  AMMONIA 17     ABG    Component Value Date/Time   PHART 7.406 11/11/2022 1332   PCO2ART 45.5 11/11/2022 1332   PO2ART 133 (H) 11/11/2022 1332   HCO3 28.8 (H) 11/11/2022 1332   TCO2 30 11/11/2022 1332   O2SAT 99 11/11/2022 1332     Coagulation Profile: Recent Labs  Lab 12/15/22 1154  INR 1.1     Cardiac Enzymes: No results for input(s): "CKTOTAL", "CKMB", "CKMBINDEX", "TROPONINI" in the last 168 hours.  HbA1C: Hgb A1c MFr  Bld  Date/Time Value Ref Range Status  12/17/2022 03:05 AM 5.9 (H) 4.8 - 5.6 % Final    Comment:    (NOTE) Pre diabetes:          5.7%-6.4%  Diabetes:              >6.4%  Glycemic control for   <7.0% adults with diabetes   01/02/2022 03:48 PM 5.6 4.8 - 5.6 % Final    Comment:             Prediabetes: 5.7 - 6.4          Diabetes: >6.4          Glycemic control for adults with diabetes: <7.0     CBG: Recent Labs  Lab 12/16/22 1129 12/16/22 1513 12/16/22 1930 12/16/22 2311 12/17/22 0311  GLUCAP 130* 132* 146* 161* 201*     Review of Systems:   Unable to ascertain given mental status  Past Medical History:  He,  has a past medical history of Acute urinary retention, Anxiety, Brain injury (HCC) (1975), Complication of anesthesia (08/29/13), Difficult intubation (08/31/13), Head injury (1975), Manic depression (HCC), Pelvic fracture (HCC) (09/21/13), Schizo affective schizophrenia (HCC), and Schizophrenia (HCC).   Surgical History:   Past Surgical History:  Procedure Laterality Date   COSMETIC SURGERY Left 1975   created new ear lobe   DRESSING CHANGE UNDER ANESTHESIA Left 08/31/2013   Procedure: DRESSING CHANGE UNDER ANESTHESIA for right ankle and left thigh;  Surgeon: Budd Palmer, MD;  Location: [facility] OR;  Service:  Orthopedics;  Laterality: Left;   I & D EXTREMITY Bilateral 08/30/2013   Procedure: IRRIGATION AND DEBRIDEMENT with closure Left Thigh wound, Irrigation and debridement Right Ankle ;  Surgeon: Harvie Junior, MD;  Location: [facility] OR;  Service: Orthopedics;  Laterality: Bilateral;   I & D EXTREMITY Left 09/26/2013   Procedure: LEFT IRRIGATION AND DEBRIDEMENT Robby Sermon;  Surgeon: Budd Palmer, MD;  Location: [facility] OR;  Service: Orthopedics;  Laterality: Left;   I & D EXTREMITY Left 10/02/2013   Procedure: IRRIGATION AND DEBRIDEMENT EXTREMITY;  Surgeon: Budd Palmer, MD;  Location: [facility] OR;  Service: Orthopedics;  Laterality: Left;   INCISION AND DRAINAGE OF WOUND Left 09/21/2013   Procedure: IRRIGATION AND DEBRIDEMENT SOFT TISSUE WOUND WITH LARGE WOUND VAC PLACEMENT;  Surgeon: Budd Palmer, MD;  Location: [facility] OR;  Service: Orthopedics;  Laterality: Left;   MANDIBLE FRACTURE SURGERY  1975   SACRO-ILIAC PINNING Left 08/31/2013   Procedure: LEFT SACRO-ILIAC PINNING;  Surgeon: Budd Palmer, MD;  Location: [facility] OR;  Service: Orthopedics;  Laterality: Left;   SKIN GRAFT     SKIN GRAFT Left 10/31/2013   left thigh      DR HANDY    SKIN SPLIT GRAFT Left 10/02/2013   Procedure: SKIN GRAFT SPLIT THICKNESS;  Surgeon: Budd Palmer, MD;  Location: [facility] OR;  Service: Orthopedics;  Laterality: Left;   SKIN SPLIT GRAFT Left 10/31/2013   Procedure: LEFT THIGH SKIN GRAFT SPLIT THICKNESS;  Surgeon: Budd Palmer, MD;  Location: [facility] OR;  Service: Orthopedics;  Laterality: Left;     Social History:   reports that he has been smoking cigars. He has never used smokeless tobacco. He reports current alcohol use of about 6.0 standard drinks of alcohol per week. He reports that he does not use drugs.   Family History:  His family history includes Bone cancer in his father; Diabetes in his mother; Obsessive Compulsive Disorder  in his father. There is no history of Colon cancer, Rectal cancer, Liver cancer, or  Pancreatic cancer.   Allergies Allergies  Allergen Reactions   Penicillins Other (See Comments)    Unsure of reaction, had allergic reaction as a child and has not taken since then     Home Medications  Prior to Admission medications   Medication Sig Start Date End Date Taking? Authorizing Provider  acetaminophen (TYLENOL) 325 MG tablet Take 2 tablets (650 mg total) by mouth every 6 (six) hours as needed for moderate pain (headache). 11/26/22   Leroy Sea, MD  busPIRone (BUSPAR) 15 MG tablet Take 1 tablet (15 mg total) by mouth 3 (three) times daily. Patient taking differently: Take 15 mg by mouth 3 (three) times daily. Take as needed for anxiety 08/27/22   Park Pope, MD  ezetimibe (ZETIA) 10 MG tablet Take 1 tablet (10 mg total) by mouth daily. 11/26/22   Leroy Sea, MD  finasteride (PROSCAR) 5 MG tablet Take 1 tablet (5 mg total) by mouth daily. 11/27/22   Leroy Sea, MD  midodrine (PROAMATINE) 10 MG tablet Take 1 tablet (10 mg total) by mouth every 8 (eight) hours. 11/26/22   Leroy Sea, MD  OLANZapine (ZYPREXA) 20 MG tablet Take 1 tablet (20 mg total) by mouth at bedtime. 08/27/22   Park Pope, MD  rosuvastatin (CRESTOR) 40 MG tablet Take 1 tablet (40 mg total) by mouth daily. 03/17/22   Sharlene Dory, NP  thiamine (VITAMIN B-1) 100 MG tablet Take 1 tablet (100 mg total) by mouth daily. 11/27/22   Leroy Sea, MD     Critical care time: 48 Minutes    JD Anselm Lis [facility] 12/17/2022, 7:21 AM  Please see [email or web address] for pager details.  From 7A-7P if no response, please call [phone]. After hours, please call ELink [phone].

## 2022-12-17 NOTE — Progress Notes (Signed)
SLP Cancellation Note  Patient Details Name: [name] MRN: [id number] DOB: Aug 18, 1950   Cancelled treatment:       Reason Eval/Treat Not Completed: Fatigue/lethargy limiting ability to participate; pt has Cortrak per nursing and limited appetite.  ST will continue efforts.   Pat Ysabela Keisler,M.S., CCC-SLP 12/17/2022, 2:03 PM
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
## 2022-12-17 NOTE — Procedures (Signed)
Arterial Catheter Insertion Procedure Note  [name]  [id number]  09-17-1950  Date:12/17/22  Time:11:33 AM    Provider Performing: Cheri Fowler    Procedure: Insertion of Arterial Line (82956) with US guidance (21308)   Indication(s) Blood pressure monitoring and/or need for frequent ABGs  Consent Risks of the procedure as well as the alternatives and risks of each were explained to the patient and/or caregiver.  Consent for the procedure was obtained and is signed in the bedside chart  Anesthesia None   Time Out Verified patient identification, verified procedure, site/side was marked, verified correct patient position, special equipment/implants available, medications/allergies/relevant history reviewed, required imaging and test results available.   Sterile Technique Maximal sterile technique including full sterile barrier drape, hand hygiene, sterile gown, sterile gloves, mask, hair covering, sterile ultrasound probe cover (if used).   Procedure Description Area of catheter insertion was cleaned with chlorhexidine and draped in sterile fashion. With real-time ultrasound guidance an arterial catheter was placed into the right  Axillary  artery.  Appropriate arterial tracings confirmed on monitor.     Complications/Tolerance None; patient tolerated the procedure well.   EBL Minimal   Specimen(s) None

## 2022-12-17 NOTE — Plan of Care (Signed)

## 2022-12-17 NOTE — Inpatient Diabetes Management (Signed)
Inpatient Diabetes Program Recommendations  AACE/ADA: New Consensus Statement on Inpatient Glycemic Control (2015)  Target Ranges:  Prepandial:   less than 140 mg/dL      Peak postprandial:   less than 180 mg/dL (1-2 hours)      Critically ill patients:  140 - 180 mg/dL   Lab Results  Component Value Date   GLUCAP 163 (H) 12/17/2022   HGBA1C 5.9 (H) 12/17/2022    Review of Glycemic Control  Latest Reference Range & Units 12/16/22 23:11 12/17/22 03:11 12/17/22 07:46 12/17/22 11:32  Glucose-Capillary 70 - 99 mg/dL 829 (H) 562 (H) 130 (H) 163 (H)  (H): Data is abnormally high Diabetes history: Type 2 DM Outpatient Diabetes medications: none Current orders for Inpatient glycemic control: Novolog 0-15 units Q4H Osmolite @ 65 ml/hr Inpatient Diabetes Program Recommendations:    Consider adding Novolog 3 units Q4H for tube feed coverage (to be stopped or held in the event tube feed stopped).   Thanks, Lujean Rave, MSN, RNC-OB Diabetes Coordinator [phone] (8a-5p)

## 2022-12-17 NOTE — Progress Notes (Signed)
[facility]  Date of Admission:  12/15/2022   Total days of inpatient antibiotics 3  Principal Problem:   Sepsis (HCC) Active Problems:   Schizophrenia (HCC)   Mixed hyperlipidemia   Essential hypertension   Alcohol dependence in remission (HCC)   PTSD (post-traumatic stress disorder)   AKI (acute kidney injury) (HCC)   Encephalopathy acute   Septic shock (HCC)   Hyperglycemia   Malnutrition of moderate degree          Assessment: 72 year old male with history of dementia, schizophrenia, recent admission for a sepsis due to sacral decubitus ulcers treated with 7 days antibiotics (4/9-4/25).  Course complicated by acute hypoxic hypercapnic respiratory failure after getting IV Ativan requiring ICU transfer and ultimately sent from SNF for altered mental status found to have:   #MRSA bacteremia #Sacral wounds # Previous ORIF screws and ileum and into sacrum across the left SI joint - Presented with temp of 104, WBC 18 K.  CT abdomen pelvis showed no acute abnormalities.  Has history noted purulent drainage from Foley catheter, decompressed on imaging.  He was admitted for sepsis secondary to UTI.  ID engaged as cultures grew MRSA.  Foley catheter was placed at last admission as he failed voiding trial. - PICC placed on 5/14 - Blood cultures on 5/14 grew MRSA -I was able to assess the sacral wound today, there was a Phlexy tube placed earlier,  fecal matter could easily enter wound given location.  I suspect that the port of entry was likely due to left decubitus wound leading to bacteremia as a tunnels.  Although there are no acute findings on imaging and surrounding tissue sacral wound did not look erythematous.  I examined his skin, did not see signs of thrombophlebitis. -TTE on 5/15 technically difficult-> no vegetation Recommendations:  -Follow repeat blood cultures to ensure clearance - Pull PICC - TEE - Continue vancomycin - Engage wound  care for sacral wound -Orthopantogram cannot be done, CT maxilofacil   #History of schizophrenia/PTSD - Management per primary     #Acute metabolic encephalopathy - CT head negative -remote history of TBI     #Remote history of left shoulder fracture after mechanical fall in August 2021   Microbiology:   Antibiotics: Vancomycin and cefepime 5/14-   Cultures: Blood 5/14 2/2 MRSA 5/15   SUBJECTIVE: Resting in bed. No new compalitns Interval: Tmax 100.6 overnight, wbc 15.2k  Review of Systems: Review of Systems  All other systems reviewed and are negative.    Scheduled Meds:  busPIRone  15 mg Oral TID   Chlorhexidine Gluconate Cloth  6 each Topical Q0600   ezetimibe  10 mg Oral Daily   feeding supplement  237 mL Oral BID BM   feeding supplement (PROSource TF20)  60 mL Per Tube BID   folic acid  1 mg Oral Daily   insulin aspart  0-15 Units Subcutaneous Q4H   midodrine  20 mg Oral Q8H   multivitamin with minerals  1 tablet Oral Daily   mupirocin ointment  1 Application Nasal BID   nutrition supplement (JUVEN)  1 packet Per Tube BID BM   OLANZapine  20 mg Oral QHS   rosuvastatin  40 mg Oral Daily   sodium chloride flush  10-40 mL Intracatheter Q12H   thiamine  100 mg Oral Daily   Continuous Infusions:  sodium chloride 10 mL/hr at 12/17/22 1000   sodium chloride  sodium chloride     cefTRIAXone (ROCEPHIN)  IV     feeding supplement (OSMOLITE 1.5 CAL) 55 mL/hr at 12/17/22 1000   norepinephrine (LEVOPHED) Adult infusion 19 mcg/min (12/17/22 1000)   potassium PHOSPHATE IVPB (in mmol) 43 mL/hr at 12/17/22 1000   vancomycin Stopped (12/17/22 0918)   vasopressin 0.03 Units/min (12/17/22 1000)   PRN Meds:.Place/Maintain arterial line **AND** sodium chloride, acetaminophen **OR** acetaminophen, albuterol, docusate, ondansetron **OR** ondansetron (ZOFRAN) IV, polyethylene glycol, sodium chloride flush Allergies  Allergen Reactions   Penicillins Other (See  Comments)    Unsure of reaction, had allergic reaction as a child and has not taken since then    OBJECTIVE: Vitals:   12/17/22 1000 12/17/22 1015 12/17/22 1030 12/17/22 1045  BP: (!) 98/58 (!) 103/57 (!) 101/58 97/63  Pulse: 89 92 91 85  Resp: (!) 23 (!) 23 (!) 23 20  Temp: 99.7 F (37.6 C) 99.9 F (37.7 C) 100 F (37.8 C) 100.2 F (37.9 C)  TempSrc:      SpO2: 99% 99% 99% 99%  Weight:      Height:       Body mass index is 24.46 kg/m.  Physical Exam Constitutional:      General: He is not in acute distress.    Appearance: He is normal weight. He is not toxic-appearing.  HENT:     Head: Normocephalic and atraumatic.     Right Ear: External ear normal.     Left Ear: External ear normal.     Nose: No congestion or rhinorrhea.     Mouth/Throat:     Mouth: Mucous membranes are moist.     Pharynx: Oropharynx is clear.  Eyes:     Extraocular Movements: Extraocular movements intact.     Conjunctiva/sclera: Conjunctivae normal.     Pupils: Pupils are equal, round, and reactive to light.  Cardiovascular:     Rate and Rhythm: Normal rate and regular rhythm.     Heart sounds: No murmur heard.    No friction rub. No gallop.  Pulmonary:     Effort: Pulmonary effort is normal.     Breath sounds: Normal breath sounds.  Abdominal:     General: Abdomen is flat. Bowel sounds are normal.     Palpations: Abdomen is soft.  Musculoskeletal:        General: No swelling.     Cervical back: Normal range of motion and neck supple.  Skin:    General: Skin is warm and dry.  Neurological:     General: No focal deficit present.     Mental Status: He is oriented to person, place, and time.  Psychiatric:        Mood and Affect: Mood normal.       Lab Results Lab Results  Component Value Date   WBC 15.2 (H) 12/17/2022   HGB 10.1 (L) 12/17/2022   HCT 30.7 (L) 12/17/2022   MCV 87.2 12/17/2022   PLT 98 (L) 12/17/2022    Lab Results  Component Value Date   CREATININE 0.98  12/17/2022   BUN 18 12/17/2022   NA 132 (L) 12/17/2022   K 2.9 (L) 12/17/2022   CL 95 (L) 12/17/2022   CO2 26 12/17/2022    Lab Results  Component Value Date   ALT 40 12/16/2022   AST 78 (H) 12/16/2022   ALKPHOS 58 12/16/2022   BILITOT 1.3 (H) 12/16/2022        Danelle Earthly, MD [facility] 12/17/2022, 11:47 AM   I have personally spent 55 minutes involved in face-to-face and non-face-to-face activities for this patient on the day of the visit. Professional time spent includes the following activities: Preparing to see the patient (review of tests), Obtaining and/or reviewing separately obtained history (admission/discharge record), Performing a medically appropriate examination and/or evaluation , Ordering medications/tests/procedures, referring and communicating with other health care professionals, Documenting clinical information in the EMR, Independently interpreting results (not separately reported), Communicating results to the patient/family/caregiver, Counseling and educating the patient/family/caregiver and Care coordination (not separately reported).

## 2022-12-17 NOTE — Progress Notes (Signed)
[facility] ADULT ICU REPLACEMENT PROTOCOL   The patient does apply for the [facility] Adult ICU Electrolyte Replacment Protocol based on the criteria listed below:   1.Exclusion criteria: TCTS, ECMO, Dialysis, and Myasthenia Gravis patients 2. Is GFR >/= 30 ml/min? Yes.    Patient's GFR today is >60 3. Is SCr </= 2? Yes.   Patient's SCr is 0.98 mg/dL 4. Did SCr increase >/= 0.5 in 24 hours? No. 5.Pt's weight >40kg  Yes.   6. Abnormal electrolyte(s): potassium 2.9, phos 2.1, mag 1.9 7. Electrolytes replaced per protocol 8.  Call MD STAT for K+ </= 2.5, Phos </= 1, or Mag </= 1 Physician:  protocol  Melvern Banker 12/17/2022 5:24 AM

## 2022-12-17 NOTE — Progress Notes (Signed)
eLink Physician-Brief Progress Note Patient Name: [name] DOB: 09-25-1950 MRN: [id number]   Date of Service  12/17/2022  HPI/Events of Note  Patient with new onset anisocoria, rest of physical exam is non-focal per bedside RN.  eICU Interventions  Stat Brain CT without contrast ordered.        Migdalia Dk 12/17/2022, 9:44 PM

## 2022-12-18 ENCOUNTER — Other Ambulatory Visit: Payer: Self-pay

## 2022-12-18 DIAGNOSIS — E440 Moderate protein-calorie malnutrition: Secondary | ICD-10-CM | POA: Diagnosis not present

## 2022-12-18 DIAGNOSIS — R6521 Severe sepsis with septic shock: Secondary | ICD-10-CM | POA: Diagnosis not present

## 2022-12-18 DIAGNOSIS — N179 Acute kidney failure, unspecified: Secondary | ICD-10-CM | POA: Diagnosis not present

## 2022-12-18 DIAGNOSIS — R6520 Severe sepsis without septic shock: Secondary | ICD-10-CM | POA: Diagnosis not present

## 2022-12-18 DIAGNOSIS — A419 Sepsis, unspecified organism: Secondary | ICD-10-CM | POA: Diagnosis not present

## 2022-12-18 LAB — MAGNESIUM: Magnesium: 1.9 mg/dL (ref 1.7–2.4)

## 2022-12-18 LAB — VANCOMYCIN, PEAK
Vancomycin Pk: 18 ug/mL — ABNORMAL LOW (ref 30–40)
Vancomycin Pk: 37 ug/mL (ref 30–40)

## 2022-12-18 LAB — GLUCOSE, CAPILLARY
Glucose-Capillary: 155 mg/dL — ABNORMAL HIGH (ref 70–99)
Glucose-Capillary: 165 mg/dL — ABNORMAL HIGH (ref 70–99)
Glucose-Capillary: 136 mg/dL — ABNORMAL HIGH (ref 70–99)
Glucose-Capillary: 141 mg/dL — ABNORMAL HIGH (ref 70–99)
Glucose-Capillary: 157 mg/dL — ABNORMAL HIGH (ref 70–99)
Glucose-Capillary: 133 mg/dL — ABNORMAL HIGH (ref 70–99)

## 2022-12-18 LAB — CBC
HCT: 33.1 % — ABNORMAL LOW (ref 39.0–52.0)
Hemoglobin: 11.2 g/dL — ABNORMAL LOW (ref 13.0–17.0)
MCH: 29.1 pg (ref 26.0–34.0)
MCHC: 33.8 g/dL (ref 30.0–36.0)
MCV: 86 fL (ref 80.0–100.0)
Platelets: 96 10*3/uL — ABNORMAL LOW (ref 150–400)
RBC: 3.85 MIL/uL — ABNORMAL LOW (ref 4.22–5.81)
RDW: 15.1 % (ref 11.5–15.5)
WBC: 14.7 10*3/uL — ABNORMAL HIGH (ref 4.0–10.5)
nRBC: 0 % (ref 0.0–0.2)

## 2022-12-18 LAB — BASIC METABOLIC PANEL
Anion gap: 10 (ref 5–15)
BUN: 23 mg/dL (ref 8–23)
CO2: 29 mmol/L (ref 22–32)
Calcium: 8 mg/dL — ABNORMAL LOW (ref 8.9–10.3)
Chloride: 98 mmol/L (ref 98–111)
Creatinine, Ser: 0.74 mg/dL (ref 0.61–1.24)
GFR, Estimated: >60 mL/min (ref >60)
Glucose, Bld: 165 mg/dL — ABNORMAL HIGH (ref 70–99)
Potassium: 3.4 mmol/L — ABNORMAL LOW (ref 3.5–5.1)
Sodium: 137 mmol/L (ref 135–145)

## 2022-12-18 LAB — URINE CULTURE

## 2022-12-18 LAB — PHOSPHORUS
Phosphorus: 1.4 mg/dL — ABNORMAL LOW (ref 2.5–4.6)
Phosphorus: 2.4 mg/dL — ABNORMAL LOW (ref 2.5–4.6)

## 2022-12-18 MED ORDER — ONDANSETRON HCL 4 MG PO TABS: 4.0000 mg | ORAL_TABLET | Freq: Four times a day (QID) | ORAL | Status: DC | PRN

## 2022-12-18 MED ORDER — THIAMINE MONONITRATE 100 MG PO TABS
100.0000 mg | ORAL_TABLET | Freq: Every day | ORAL | Status: DC
  Administered 2022-12-19 – 2022-12-23 (×4): 100 mg
  Filled 2022-12-18 (×5): qty 1

## 2022-12-18 MED ORDER — POTASSIUM PHOSPHATES 15 MMOLE/5ML IV SOLN
45.0000 mmol | Freq: Once | INTRAVENOUS | Status: AC
  Administered 2022-12-18: 45 mmol via INTRAVENOUS
  Filled 2022-12-18: qty 15

## 2022-12-18 MED ORDER — ROSUVASTATIN CALCIUM 20 MG PO TABS
40.0000 mg | ORAL_TABLET | Freq: Every day | ORAL | Status: DC
  Administered 2022-12-19 – 2022-12-23 (×4): 40 mg
  Filled 2022-12-18 (×5): qty 2

## 2022-12-18 MED ORDER — ACETAMINOPHEN 325 MG PO TABS
650.0000 mg | ORAL_TABLET | Freq: Four times a day (QID) | ORAL | Status: DC | PRN
  Administered 2022-12-18 – 2022-12-19 (×4): 650 mg
  Filled 2022-12-18 (×5): qty 2

## 2022-12-18 MED ORDER — BUSPIRONE HCL 15 MG PO TABS
15.0000 mg | ORAL_TABLET | Freq: Three times a day (TID) | ORAL | Status: DC
  Administered 2022-12-18 – 2022-12-23 (×13): 15 mg
  Filled 2022-12-18 (×14): qty 1

## 2022-12-18 MED ORDER — DOCUSATE SODIUM 50 MG/5ML PO LIQD: 100.0000 mg | Freq: Two times a day (BID) | ORAL | Status: DC | PRN

## 2022-12-18 MED ORDER — MIDODRINE HCL 5 MG PO TABS
20.0000 mg | ORAL_TABLET | Freq: Three times a day (TID) | ORAL | Status: DC
  Administered 2022-12-18 – 2022-12-20 (×6): 20 mg
  Filled 2022-12-18 (×6): qty 4

## 2022-12-18 MED ORDER — ACETAMINOPHEN 650 MG RE SUPP: 650.0000 mg | Freq: Four times a day (QID) | RECTAL | Status: DC | PRN

## 2022-12-18 MED ORDER — OLANZAPINE 10 MG PO TABS
20.0000 mg | ORAL_TABLET | Freq: Every day | ORAL | Status: DC
  Administered 2022-12-18 – 2022-12-22 (×5): 20 mg
  Filled 2022-12-18 (×6): qty 2

## 2022-12-18 MED ORDER — EZETIMIBE 10 MG PO TABS
10.0000 mg | ORAL_TABLET | Freq: Every day | ORAL | Status: DC
  Administered 2022-12-19 – 2022-12-23 (×4): 10 mg
  Filled 2022-12-18 (×5): qty 1

## 2022-12-18 MED ORDER — FOLIC ACID 1 MG PO TABS
1.0000 mg | ORAL_TABLET | Freq: Every day | ORAL | Status: DC
  Administered 2022-12-19 – 2022-12-23 (×4): 1 mg
  Filled 2022-12-18 (×5): qty 1

## 2022-12-18 MED ORDER — ONDANSETRON HCL 4 MG/2ML IJ SOLN: 4.0000 mg | Freq: Four times a day (QID) | INTRAMUSCULAR | Status: DC | PRN

## 2022-12-18 MED ORDER — MAGNESIUM SULFATE 2 GM/50ML IV SOLN
2.0000 g | Freq: Once | INTRAVENOUS | Status: AC
  Administered 2022-12-18: 2 g via INTRAVENOUS
  Filled 2022-12-18: qty 50

## 2022-12-18 MED ORDER — POLYETHYLENE GLYCOL 3350 17 G PO PACK: 17.0000 g | PACK | Freq: Every day | ORAL | Status: DC | PRN

## 2022-12-18 MED ORDER — ADULT MULTIVITAMIN W/MINERALS CH
1.0000 | ORAL_TABLET | Freq: Every day | ORAL | Status: DC
  Administered 2022-12-19 – 2022-12-23 (×4): 1
  Filled 2022-12-18 (×5): qty 1

## 2022-12-18 NOTE — Progress Notes (Signed)
[facility] ADULT ICU REPLACEMENT PROTOCOL   The patient does apply for the [facility] Adult ICU Electrolyte Replacment Protocol based on the criteria listed below:   1.Exclusion criteria: TCTS, ECMO, Dialysis, and Myasthenia Gravis patients 2. Is GFR >/= 30 ml/min? Yes.    Patient's GFR today is >60 3. Is SCr </= 2? Yes.   Patient's SCr is 0.74 mg/dL 4. Did SCr increase >/= 0.5 in 24 hours? No. 5.Pt's weight >40kg  Yes.   6. Abnormal electrolyte(s):   K 3.4, Phos 1.4, Mg 1.9  7. Electrolytes replaced per protocol 8.  Call MD STAT for K+ </= 2.5, Phos </= 1, or Mag </= 1 Physician:  Shawn Stall R [place] Lindblad 12/18/2022 6:14 AM

## 2022-12-18 NOTE — Progress Notes (Signed)
NAME:  [name], MRN:  [id number], DOB:  1950/11/17, LOS: 3 ADMISSION DATE:  12/15/2022, CONSULTATION DATE:  12/15/2022 REFERRING MD:  Dr. Jerral Ralph, CHIEF COMPLAINT:  Sepsis   History of Present Illness:  [name] is a 72 year old male with past medical history of TBI with chronic dysarthria, schizophrenia, and recent hospitalization for sepsis complicated by hypercarbia who presented to the ED from a skilled nursing facility for evaluation of altered mental status. Concern at the nursing home was for generalized weakness and worsening confusion. Attempted to call nursing facility however did not get an answer.   In the ED, he was febrile to 104, tachycardic to 134, hypotensive, and was found to have purulent urine in his foley catheter. He was given 3L of IV fluids, as well as started on empiric antibiotics. Due to persistently low MAPs after 3L, PCCM was consulted for evaluation for pressor support.   Pertinent  Medical History  HTN Schizophrenia  Anxiety/Depression TBI - chronic dysarthria  Recent hospitalization from 4/9-4/25 for sepsis complicated by hypercarbia  Significant Hospital Events: Including procedures, antibiotic start and stop dates in addition to other pertinent events   5/14 - Started on Cefepime and Vancomycin  5/14 - Started on Low Dose Levo  5/15- bcx2 growing MRSA; on 20 levo and 0.03 vaso; picc line placed Echo w/ lvef 55-60%; severely enlarged right ventricle; no evidence of endocardidtis 5/17 levo down to 10. Failed swallow. Cortrak in place.  Interim History / Subjective:   Remains on 10 levo and off vaso Failed swallow evaluation this morning.  No complaints   Objective   Blood pressure 104/62, pulse 89, temperature 100.2 F (37.9 C), resp. rate 20, height 6\' 2"  (1.88 m), weight 83.1 kg, SpO2 98 %.        Intake/Output Summary (Last 24 hours) at 12/18/2022 0858 Last data filed at 12/18/2022 0800 Gross per 24 hour  Intake 3376.34 ml   Output 2775 ml  Net 601.34 ml    Filed Weights   12/16/22 0500 12/17/22 0500 12/18/22 0500  Weight: 83.9 kg 86.4 kg 83.1 kg    Examination:  General: Chronically ill appearing elderly male in NAD HEENT: [place]/AT, PERRL, no JVD Neuro: Awake, alert, oriented.  CV: RRR, no murmur PULM:  transferred upper airway sounds from tongue obstruction. Resolves with tongue protrusion.  GI: soft, bsx4 active  Extremities: no acute deformity. Sacral dressing in place.  Skin: Grossly intact.    Resolved Hospital Problem list   NA  Assessment & Plan:   Septic shock Complicated UTI from chronic indwelling foley: foley exchanged on presentation.  MRSA bacteremia P: -Wean levo for MAP goal 65 -Continue vancomycin per ID recommendations  -CTX continue for UTI -Need to DC PICC once we have lower pressor requirements.  -f/u cultures/susceptibilities -trend wbc/fever curve -midodrine 20mg  q 8 hours -will need TEE when more stable  AKI Hypokalemia - CT Abdomen without hydronephrosis  P: -Kphos -Trend BMP / urinary output -Replace electrolytes as indicated -Avoid nephrotoxic agents, ensure adequate renal perfusion  Hyperglycemia -A1c 5/16 5.9 P: -cont ssi and cbg monitoring  Chronic Indwelling Foley Cath  Failed voiding trial with prior hospitalization - Foley Catheter inserted at that time for outpatient urology evaluation  P: -foley changed out on admission -monitor UOP -hold finasteride for now  Chronic HFpEF  Last echo showed grade 1 diastolic dysfunction, with an EF of 60-65%. Does not seem volume overloaded on exam.; repeat echo 5/15 lvef 55-60%; severely  dilated RV P: -daily weights; strict I/O's  Sacral Ulcer Wound care as necessary. Was septic from this during previous hospitalization. Currently non-bleeding, no discharge present, appears to be healing well P: -WOC consult signed off  History of EtOH use  Ethanol wnl P: -resume home thiamine  Schizophrenia/PTSD   P: -cont home zyprexa and buspar   Anemia Thrombocytopenia -likely sepsis related P: -trend cbc    Best Practice (right click and "Reselect all SmartList Selections" daily)   Diet/type: full liquids ; slp following DVT prophylaxis: SCD; consider switching back to heparin sub cu tomorrow if no more bleeding from foley GI prophylaxis: N/A Lines: N/A Foley:  Yes, and it is still needed Code Status:  full code Last date of multidisciplinary goals of care discussion: 5/16 attempted to contact friend [name] over phone but no answer  Critical care time: 36 minutes     Joneen Roach, AGACNP-BC [facility]  See Amion for personal pager PCCM on call pager [phone] until 7pm. Please call Elink 7p-7a. [phone]  12/18/2022 9:03 AM

## 2022-12-18 NOTE — Progress Notes (Signed)
    [facility] has been requested to perform a transesophageal echocardiogram on [name] for MRSA bacteremia.  Per chart review, patient is a 72 year old male with a past medical history of TBI with chronic dysarthria, schizophrenia, left should fracture with malunion following a fall in 2021. Patient was recently admitted from 4/9 - 4/25 for sepsis complicated by hypercarbia. He was discharged to SNF with foley catheter. He presented to the ED on 5/14 for evaluation of altered mental status. Found to have MRSA bacteremia. Possible sources of infection include foley catheter (with purulent drainage) or sacral wounds. TEE was requested to rule out endocarditis   Echocardiogram on 12/16/22 showed EF 55-60%, no regional wall motion abnormalities, normal RV systolic function.   Patient does have severe dysarthria. However, he was alert and oriented on my examination. Speech and language pathology has been following him for dysphagia treatment. Modified varium swallow study on 5/15 showed moderate oral dysphagia and a mild pharyngeal phase dysphagia.    After careful review of history and examination, the risks and benefits of transesophageal echocardiogram have been explained including risks of esophageal damage, perforation (1:10,000 risk), bleeding, pharyngeal hematoma as well as other potential complications associated with conscious sedation including aspiration, arrhythmia, respiratory failure and death. Alternatives to treatment were discussed, questions were answered. Patient is willing to proceed.   Jonita Albee, PA-C 12/18/2022 3:56 PM

## 2022-12-18 NOTE — Progress Notes (Signed)
Pt foley continues to have blood @ site of  insertion
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
## 2022-12-18 NOTE — Progress Notes (Signed)
eLink Physician-Brief Progress Note Patient Name: [name] DOB: 1951/02/23 MRN: [id number]   Date of Service  12/18/2022  HPI/Events of Note  Patient maxed out on peripheral Norepinephrine gtt and will need central access for higher vasopressor doses.  eICU Interventions  PICC line ordered.        Migdalia Dk 12/18/2022, 9:26 PM

## 2022-12-18 NOTE — Progress Notes (Signed)
eLink Physician-Brief Progress Note Patient Name: [name] DOB: [birth year] MRN: [id number]   Date of Service  12/18/2022  HPI/Events of Note  CT head result reviewed, no acute abnormality, patient was alert & oriented x 4, no reason to suspect seizure.  eICU Interventions  Continue to monitor patient overnight with AM re-assessment by daytime PCCM attending physician.        Bisma Klett U Akshay Spang 12/18/2022, 1:08 AM

## 2022-12-18 NOTE — Progress Notes (Addendum)
[facility]  Date of Admission:  12/15/2022   Total days of inpatient antibiotics 3  Principal Problem:   Sepsis (HCC) Active Problems:   Schizophrenia (HCC)   Mixed hyperlipidemia   Essential hypertension   Alcohol dependence in remission (HCC)   PTSD (post-traumatic stress disorder)   AKI (acute kidney injury) (HCC)   Encephalopathy acute   Septic shock (HCC)   Hyperglycemia   Malnutrition of moderate degree          Assessment: 72 year old male with history of dementia, schizophrenia, recent admission for a sepsis due to sacral decubitus ulcers treated with 7 days antibiotics (4/9-4/25).  Course complicated by acute hypoxic hypercapnic respiratory failure after getting IV Ativan requiring ICU transfer and ultimately sent from SNF for altered mental status found to have:   #MRSA bacteremia #Sacral wounds # Previous ORIF screws and ileum and into sacrum across the left SI joint - Presented with temp of 104, WBC 18 K.  CT abdomen pelvis showed no acute abnormalities.  Has history noted purulent drainage from Foley catheter, decompressed on imaging.  He was admitted for sepsis secondary to UTI.  ID engaged as cultures grew MRSA.  Foley catheter was placed at last admission as he failed voiding trial. - PICC placed on 5/14 - Blood cultures on 5/14 grew MRSA, Urine CX+ MRSA(which is spill over form blood, as urine is not source of bacteremia -I was able to assess the sacral wound today, there was a Phlexy tube placed earlier,  fecal matter could easily enter wound given location.  I suspect that the port of entry was likely due to left decubitus wound leading to bacteremia as a tunnels.  Although there are no acute findings on imaging and surrounding tissue sacral wound did not look erythematous.  I examined his skin, did not see signs of thrombophlebitis. -TTE on 5/15 technically difficult-> no vegetation. CT Maxilla-facial showed no abscess.   Recommendations: - Continue vancomycin - Follow repeat blood cultures to ensure clearance - Pull PICC  - D/C ctx(it was added as pt fevered requiring pressors overnight, this likely due to burden of infection). OF note fever curbve trending down since admission -TEE Monday - Engaged wound care for sacral wound    #History of schizophrenia/PTSD - Management per primary     #Acute metabolic encephalopathy - CT head negative -remote history of TBI     #Remote history of left shoulder fracture after mechanical fall in August 2021  Dr. Renold Don is covering this weekend.  Microbiology:   Antibiotics: Vancomycin and cefepime 5/14-   Cultures: Blood 5/14 2/2 MRSA 5/15   SUBJECTIVE: Resting in bed. No new compalitns Interval: Tmax 100.3, wbc 4.7k  Review of Systems: Review of Systems  All other systems reviewed and are negative.    Scheduled Meds:  busPIRone  15 mg Oral TID   Chlorhexidine Gluconate Cloth  6 each Topical Q0600   ezetimibe  10 mg Oral Daily   feeding supplement  237 mL Oral BID BM   feeding supplement (PROSource TF20)  60 mL Per Tube BID   folic acid  1 mg Oral Daily   insulin aspart  0-15 Units Subcutaneous Q4H   midodrine  20 mg Oral Q8H   multivitamin with minerals  1 tablet Oral Daily   mupirocin ointment  1 Application Nasal BID   nutrition supplement (JUVEN)  1 packet Per Tube BID BM   OLANZapine  20 mg  Oral QHS   rosuvastatin  40 mg Oral Daily   sodium chloride flush  10-40 mL Intracatheter Q12H   thiamine  100 mg Oral Daily   Continuous Infusions:  sodium chloride 10 mL/hr at 12/18/22 0900   sodium chloride     sodium chloride     feeding supplement (OSMOLITE 1.5 CAL) 65 mL/hr at 12/18/22 0900   norepinephrine (LEVOPHED) Adult infusion 10 mcg/min (12/18/22 0900)   potassium PHOSPHATE IVPB (in mmol) 64.4 mL/hr at 12/18/22 0900   vancomycin 1,000 mg (12/18/22 0917)   PRN Meds:.Place/Maintain arterial line **AND** sodium chloride,  acetaminophen **OR** acetaminophen, albuterol, docusate, ondansetron **OR** ondansetron (ZOFRAN) IV, polyethylene glycol, sodium chloride flush Allergies  Allergen Reactions   Penicillins Other (See Comments)    Unsure of reaction, had allergic reaction as a child and has not taken since then    OBJECTIVE: Vitals:   12/18/22 0930 12/18/22 0945 12/18/22 1000 12/18/22 1132  BP:      Pulse: 93 89 85   Resp: (!) 24 (!) 22 (!) 21   Temp: 99.7 F (37.6 C) 99.5 F (37.5 C) 99.5 F (37.5 C) 99 F (37.2 C)  TempSrc:    Bladder  SpO2: 99% 97% 99%   Weight:      Height:       Body mass index is 23.52 kg/m.  Physical Exam Constitutional:      General: He is not in acute distress.    Appearance: He is normal weight. He is not toxic-appearing.  HENT:     Head: Normocephalic and atraumatic.     Right Ear: External ear normal.     Left Ear: External ear normal.     Nose: No congestion or rhinorrhea.     Mouth/Throat:     Mouth: Mucous membranes are moist.     Pharynx: Oropharynx is clear.  Eyes:     Extraocular Movements: Extraocular movements intact.     Conjunctiva/sclera: Conjunctivae normal.     Pupils: Pupils are equal, round, and reactive to light.  Cardiovascular:     Rate and Rhythm: Normal rate and regular rhythm.     Heart sounds: No murmur heard.    No friction rub. No gallop.  Pulmonary:     Effort: Pulmonary effort is normal.     Breath sounds: Normal breath sounds.  Abdominal:     General: Abdomen is flat. Bowel sounds are normal.     Palpations: Abdomen is soft.  Musculoskeletal:        General: No swelling.     Cervical back: Normal range of motion and neck supple.  Skin:    General: Skin is warm and dry.  Neurological:     General: No focal deficit present.     Mental Status: He is oriented to person, place, and time.  Psychiatric:        Mood and Affect: Mood normal.       Lab Results Lab Results  Component Value Date   WBC 14.7 (H) 12/18/2022    HGB 11.2 (L) 12/18/2022   HCT 33.1 (L) 12/18/2022   MCV 86.0 12/18/2022   PLT 96 (L) 12/18/2022    Lab Results  Component Value Date   CREATININE 0.74 12/18/2022   BUN 23 12/18/2022   NA 137 12/18/2022   K 3.4 (L) 12/18/2022   CL 98 12/18/2022   CO2 29 12/18/2022    Lab Results  Component Value Date   ALT 40 12/16/2022   AST 78 (  H) 12/16/2022   ALKPHOS 58 12/16/2022   BILITOT 1.3 (H) 12/16/2022        Danelle Earthly, MD [facility] 12/18/2022, 11:32 AM   I have personally spent 52 minutes involved in face-to-face and non-face-to-face activities for this patient on the day of the visit. Professional time spent includes the following activities: Preparing to see the patient (review of tests), Obtaining and/or reviewing separately obtained history (admission/discharge record), Performing a medically appropriate examination and/or evaluation , Ordering medications/tests/procedures, referring and communicating with other health care professionals, Documenting clinical information in the EMR, Independently interpreting results (not separately reported), Communicating results to the patient/family/caregiver, Counseling and educating the patient/family/caregiver and Care coordination (not separately reported).

## 2022-12-18 NOTE — Progress Notes (Signed)
Speech Language Pathology Treatment: Dysphagia  Patient Details Name: [name] MRN: [id number] DOB: [birth year] Today's Date: 12/18/2022 Time: 4098-1191 SLP Time Calculation (min) (ACUTE ONLY): 21 min  Assessment / Plan / Recommendation Clinical Impression  Pt seen for dysphagia tx with min verbal/visual cues provided for smaller sips d/t impulsivity which led to multiple sub-swallows and a delayed cough response.  Pt given 1/2 tsp amounts of puree/thin liquids with improvement noted and no overt s/sx of aspiration.  Pt refused solid consistency to attempt to advance d/t decreased satiety.  Speech remains dysarthric (50-75% intelligible within conversation) with repetition and increased intensity increasing intelligibility, as well as using shorter phrases or giving detail during conversation.  Pt should continue full liquids with aspiration/swallowing precautions posted within the room d/t min-mod risk for aspiration d/t hx of dysphagia and deconditioned state.  ST will continue to f/u for diet advancement/education re: safety with swallowing.     HPI HPI: Patient is a 72 y.o. male with PMH: anxiety, TBI, manic depression, schizo affective schizophrenia, dysphagia, chronic dysarthria. He was admitted last month and at time of discharge, recommended diet from SLP (following MBSx2) was Regular solids, honey thick liquids. Currently, patient presented to the hospital on 12/15/22 from SNF for evaluation of AMS. Re-assessed via MBS with full liq recommendation on 5/15.  ST f/u for diet tolerance      SLP Plan  Continue with current plan of care      Recommendations for follow up therapy are one component of a multi-disciplinary discharge planning process, led by the attending physician.  Recommendations may be updated based on patient status, additional functional criteria and insurance authorization.    Recommendations  Diet recommendations: Thin liquid (full liquids) Liquids provided via:  Cup;Teaspoon;No straw Medication Administration: Via alternative means Supervision: Staff to assist with self feeding;Full supervision/cueing for compensatory strategies Compensations: Slow rate;Small sips/bites;Clear throat intermittently Postural Changes and/or Swallow Maneuvers: Seated upright 90 degrees;Upright 30-60 min after meal                  Oral care BID;Staff/trained caregiver to provide oral care   Frequent or constant Supervision/Assistance Dysarthria and anarthria (R47.1);Dysphagia, unspecified (R13.10)     Continue with current plan of care     Pat Kavari Parrillo,M.S.,,CCC-SLP  12/18/2022, 9:30 AM

## 2022-12-19 DIAGNOSIS — R6520 Severe sepsis without septic shock: Secondary | ICD-10-CM | POA: Diagnosis not present

## 2022-12-19 DIAGNOSIS — N179 Acute kidney failure, unspecified: Secondary | ICD-10-CM | POA: Diagnosis not present

## 2022-12-19 DIAGNOSIS — A419 Sepsis, unspecified organism: Secondary | ICD-10-CM | POA: Diagnosis not present

## 2022-12-19 LAB — BASIC METABOLIC PANEL
Anion gap: 9 (ref 5–15)
BUN: 25 mg/dL — ABNORMAL HIGH (ref 8–23)
CO2: 30 mmol/L (ref 22–32)
Calcium: 7.9 mg/dL — ABNORMAL LOW (ref 8.9–10.3)
Chloride: 97 mmol/L — ABNORMAL LOW (ref 98–111)
Creatinine, Ser: 0.71 mg/dL (ref 0.61–1.24)
GFR, Estimated: >60 mL/min (ref >60)
Glucose, Bld: 164 mg/dL — ABNORMAL HIGH (ref 70–99)
Potassium: 3.5 mmol/L (ref 3.5–5.1)
Sodium: 136 mmol/L (ref 135–145)

## 2022-12-19 LAB — CBC
HCT: 33.7 % — ABNORMAL LOW (ref 39.0–52.0)
Hemoglobin: 11.1 g/dL — ABNORMAL LOW (ref 13.0–17.0)
MCH: 28.5 pg (ref 26.0–34.0)
MCHC: 32.9 g/dL (ref 30.0–36.0)
MCV: 86.4 fL (ref 80.0–100.0)
Platelets: 109 10*3/uL — ABNORMAL LOW (ref 150–400)
RBC: 3.9 MIL/uL — ABNORMAL LOW (ref 4.22–5.81)
RDW: 15.3 % (ref 11.5–15.5)
WBC: 14.9 10*3/uL — ABNORMAL HIGH (ref 4.0–10.5)
nRBC: 0 % (ref 0.0–0.2)

## 2022-12-19 LAB — CULTURE, BLOOD (ROUTINE X 2): Culture: NO GROWTH

## 2022-12-19 LAB — GLUCOSE, CAPILLARY
Glucose-Capillary: 138 mg/dL — ABNORMAL HIGH (ref 70–99)
Glucose-Capillary: 152 mg/dL — ABNORMAL HIGH (ref 70–99)
Glucose-Capillary: 159 mg/dL — ABNORMAL HIGH (ref 70–99)
Glucose-Capillary: 144 mg/dL — ABNORMAL HIGH (ref 70–99)
Glucose-Capillary: 162 mg/dL — ABNORMAL HIGH (ref 70–99)
Glucose-Capillary: 146 mg/dL — ABNORMAL HIGH (ref 70–99)

## 2022-12-19 LAB — MAGNESIUM: Magnesium: 1.9 mg/dL (ref 1.7–2.4)

## 2022-12-19 LAB — PHOSPHORUS: Phosphorus: 2.1 mg/dL — ABNORMAL LOW (ref 2.5–4.6)

## 2022-12-19 MED ORDER — SODIUM CHLORIDE 0.9 % IV SOLN
250.0000 mL | INTRAVENOUS | Status: DC
  Administered 2022-12-20: 250 mL via INTRAVENOUS

## 2022-12-19 MED ORDER — POTASSIUM PHOSPHATES 15 MMOLE/5ML IV SOLN
15.0000 mmol | Freq: Once | INTRAVENOUS | Status: AC
  Administered 2022-12-19: 15 mmol via INTRAVENOUS
  Filled 2022-12-19: qty 5

## 2022-12-19 MED ORDER — POTASSIUM CHLORIDE 20 MEQ PO PACK
20.0000 meq | PACK | Freq: Once | ORAL | Status: AC
  Administered 2022-12-19: 20 meq
  Filled 2022-12-19: qty 1

## 2022-12-19 MED ORDER — LACTATED RINGERS IV BOLUS
500.0000 mL | Freq: Once | INTRAVENOUS | Status: AC
  Administered 2022-12-19: 500 mL via INTRAVENOUS

## 2022-12-19 MED ORDER — PHENTOLAMINE MESYLATE 5 MG IJ SOLR
5.0000 mg | Freq: Once | INTRAMUSCULAR | Status: AC
  Administered 2022-12-19: 5 mg via SUBCUTANEOUS
  Filled 2022-12-19: qty 5

## 2022-12-19 MED ORDER — NITROGLYCERIN 2 % TD OINT
1.0000 [in_us] | TOPICAL_OINTMENT | Freq: Three times a day (TID) | TRANSDERMAL | Status: AC
  Administered 2022-12-19 – 2022-12-20 (×6): 1 [in_us] via TOPICAL
  Filled 2022-12-19: qty 30

## 2022-12-19 MED ORDER — VANCOMYCIN HCL 750 MG/150ML IV SOLN
750.0000 mg | Freq: Two times a day (BID) | INTRAVENOUS | Status: DC
  Administered 2022-12-19 – 2022-12-23 (×8): 750 mg via INTRAVENOUS
  Filled 2022-12-19 (×9): qty 150

## 2022-12-19 MED ORDER — GLYCOPYRROLATE 0.2 MG/ML IJ SOLN
0.1000 mg | Freq: Two times a day (BID) | INTRAMUSCULAR | Status: DC | PRN
  Administered 2022-12-19: 0.1 mg via INTRAVENOUS
  Filled 2022-12-19: qty 1

## 2022-12-19 MED ORDER — NOREPINEPHRINE 4 MG/250ML-% IV SOLN
2.0000 ug/min | INTRAVENOUS | Status: DC
  Administered 2022-12-19 – 2022-12-20 (×5): 10 ug/min via INTRAVENOUS
  Administered 2022-12-21: 4 ug/min via INTRAVENOUS
  Filled 2022-12-19 (×6): qty 250

## 2022-12-19 MED ORDER — MAGNESIUM SULFATE 2 GM/50ML IV SOLN
2.0000 g | Freq: Once | INTRAVENOUS | Status: AC
  Administered 2022-12-19: 2 g via INTRAVENOUS
  Filled 2022-12-19: qty 50

## 2022-12-19 NOTE — Progress Notes (Addendum)
Per MD remove PICC order placed during night shift. To remove art line this AM

## 2022-12-19 NOTE — Progress Notes (Signed)
NAME:  [name], MRN:  [id number], DOB:  June 14, 1951, LOS: 4 ADMISSION DATE:  12/15/2022, CONSULTATION DATE:  12/15/2022 REFERRING MD:  Dr. Jerral Ralph, CHIEF COMPLAINT:  Sepsis   History of Present Illness:  [name]. Johnes is a 72 year old male with past medical history of TBI with chronic dysarthria, schizophrenia, and recent hospitalization for sepsis complicated by hypercarbia who presented to the ED from a skilled nursing facility for evaluation of altered mental status. Concern at the nursing home was for generalized weakness and worsening confusion. Attempted to call nursing facility however did not get an answer.   In the ED, he was febrile to 104, tachycardic to 134, hypotensive, and was found to have purulent urine in his foley catheter. He was given 3L of IV fluids, as well as started on empiric antibiotics. Due to persistently low MAPs after 3L, PCCM was consulted for evaluation for pressor support.   Pertinent  Medical History  HTN Schizophrenia  Anxiety/Depression TBI - chronic dysarthria  Recent hospitalization from 4/9-4/25 for sepsis complicated by hypercarbia  Significant Hospital Events: Including procedures, antibiotic start and stop dates in addition to other pertinent events   5/14 - Started on Cefepime and Vancomycin  5/14 - Started on Low Dose Levo  5/15- bcx2 growing MRSA; on 20 levo and 0.03 vaso; picc line placed Echo w/ lvef 55-60%; severely enlarged right ventricle; no evidence of endocardidtis 5/17 levo down to 10. Failed swallow. Cortrak in place.  Interim History / Subjective:   Remains on levophed  Objective   Blood pressure 96/62, pulse (!) 109, temperature (!) 100.4 F (38 C), resp. rate 16, height 6\' 2"  (1.88 m), weight 85.7 kg, SpO2 97 %.        Intake/Output Summary (Last 24 hours) at 12/19/2022 0955 Last data filed at 12/19/2022 0800 Gross per 24 hour  Intake 2652.55 ml  Output 2500 ml  Net 152.55 ml   Filed Weights   12/17/22  0500 12/18/22 0500 12/19/22 0500  Weight: 86.4 kg 83.1 kg 85.7 kg    Examination:  Gen:      No acute distress, chronically ill-appearing HEENT:  EOMI, sclera anicteric Neck:     No masses; no thyromegaly Lungs:    Clear to auscultation bilaterally; normal respiratory effort CV:         Regular rate and rhythm; no murmurs Abd:      + bowel sounds; soft, non-tender; no palpable masses, no distension Ext:    No edema; adequate peripheral perfusion Skin:      Warm and dry; no rash Neuro: Awake, responsive.  Labs/imaging reviewed Significant for BUN/creatinine 25/0.71, phosphorus 2.1 WBC 14.9, hemoglobin 11.1, platelets 109 No new imaging  Resolved Hospital Problem list   NA  Assessment & Plan:   Septic shock present on admission Complicated UTI from chronic indwelling foley: foley exchanged on presentation.  MRSA bacteremia P: Wean pressors as tolerated.  Continue midodrine LR 500 cc bolus. Continue vancomycin per ID recommendation Discontinue ceftriaxone per ID PICC line has been removed yesterday.  Will also try to remove A-line TEE planned for Monday   AKI Hypokalemia - CT Abdomen without hydronephrosis  P: Monitor and replete lites.  Hyperglycemia -A1c 5/16 5.9 P: -cont ssi and cbg monitoring  Chronic Indwelling Foley Cath  Failed voiding trial with prior hospitalization - Foley Catheter inserted at that time for outpatient urology evaluation  P: -foley changed out on admission -monitor UOP -hold finasteride for now Continues to have  mild hematuria from Foley insertion.  Chronic HFpEF  Last echo showed grade 1 diastolic dysfunction, with an EF of 60-65%. Does not seem volume overloaded on exam.; repeat echo 5/15 lvef 55-60%; severely dilated RV P: -daily weights; strict I/O's  Sacral Ulcer present on admission Wound care as necessary. Was septic from this during previous hospitalization. Currently non-bleeding, no discharge present, appears to be healing  well P: -WOC consult signed off  History of EtOH use  Ethanol wnl P: -resume home thiamine  Schizophrenia/PTSD  P: -cont home zyprexa and buspar   Anemia Thrombocytopenia -likely sepsis related P: -trend cbc  Best Practice (right click and "Reselect all SmartList Selections" daily)   Diet/type: full liquids ; slp following DVT prophylaxis: SCD; consider switching back to heparin sub cu tomorrow if no more bleeding from foley GI prophylaxis: N/A Lines: Arterial Line Foley:  Yes, and it is still needed Code Status:  full code Last date of multidisciplinary goals of care discussion: 5/16 attempted to contact friend [name] over phone but no answer  Critical care time:    The patient is critically ill with multiple organ system failure and requires high complexity decision making for assessment and support, frequent evaluation and titration of therapies, advanced monitoring, review of radiographic studies and interpretation of complex data.   Critical Care Time devoted to patient care services, exclusive of separately billable procedures, described in this note is 35 minutes.   Chilton Greathouse MD [facility] See Amion for pager  If no response to pager , please call [phone] until 7pm After 7:00 pm call Elink  [phone] 12/19/2022, 10:08 AM

## 2022-12-19 NOTE — Progress Notes (Signed)
An USGPIV (ultrasound guided PIV) has been placed for short-term vasopressor infusion. A correctly placed ivWatch must be used when administering Vasopressors. Should this treatment be needed beyond 72 hours, central line access should be obtained.  It will be the responsibility of the bedside nurse to follow best practice to prevent extravasations.   

## 2022-12-19 NOTE — Progress Notes (Signed)
Foley continues to have bloody output at site of insertion. MD made aware and DVT held

## 2022-12-19 NOTE — Progress Notes (Signed)
Pharmacy Antibiotic Note  [name] is a 72 y.o. male admitted on 12/15/2022 with MRSA bacteremia. Patient has a chronic foley catheter that has been exchanged and sacral wounds. WBC 15.2 today and fever curve trending down. SCr improved today to around 1 with CrCl ~ 80 ml/min.   VT = 18, VP = 37 (drawn around dose given 5/17 at 21:04 PM) >> calculated AUC = 660.9 which is above goal of 400-550.  Dose given this AM.  SCr remains stable.  WBC and fever curve are trending down.   Plan: Change Vancomycin to 750 mg every 12 hours (for new calculated AUC 495)    > Goal AUC 400-550    > Check vancomycin levels at steady state  Monitor renal function, blood cultures and clinical improvement  Height: 6\' 2"  (188 cm) Weight: 85.7 kg (188 lb 15 oz) IBW/kg (Calculated) : 82.2  Temp (24hrs), Avg:100.3 F (37.9 C), Min:98.6 F (37 C), Max:101.1 F (38.4 C)  Recent Labs  Lab 12/15/22 1154 12/15/22 1217 12/15/22 1444 12/15/22 1838 12/15/22 2213 12/16/22 0533 12/16/22 1519 12/17/22 0305 12/17/22 1455 12/18/22 0152 12/18/22 0447 12/18/22 1846 12/18/22 2312 12/19/22 0509  WBC 18.0*  --   --   --   --  14.4*  --  15.2*  --  14.7*  --   --   --  14.9*  CREATININE 1.77*  --   --   --   --  1.30* 1.10 0.98 0.81  --  0.74  --   --  0.71  LATICACIDVEN  --  4.0* 2.0* 2.3* 1.9  --   --   --   --   --   --   --   --   --   VANCOPEAK  --   --   --   --   --   --   --   --   --   --   --  18* 37  --     Estimated Creatinine Clearance: 99.9 mL/min (by C-G formula based on SCr of 0.71 mg/dL).    Allergies  Allergen Reactions   Penicillins Other (See Comments)    Unsure of reaction, had allergic reaction as a child and has not taken since then   Antimicrobials during this admission: Cefepime 5/14 >> 5/15 Vancomycin 5/14 >>  Metronidazole 5/14 x1  Microbiology results: 5/15 Blood cx x 2:  5/14 BCID: MRSA 5/14 Bld cx: 4/4 Staph aureus (sens pending) 5/14 MRSA PCR: detected 5/14 Ur  cx: sent  Thank you for allowing pharmacy to be a part of this patient's care.  Sharin Mons, PharmD, BCPS, BCIDP Infectious Diseases Clinical Pharmacist Phone: [phone] 12/19/2022 12:16 PM   Please refer to AMION for pharmacy phone number

## 2022-12-19 NOTE — Progress Notes (Signed)
[facility] ADULT ICU REPLACEMENT PROTOCOL   The patient does apply for the [facility] Adult ICU Electrolyte Replacment Protocol based on the criteria listed below:   1.Exclusion criteria: TCTS, ECMO, Dialysis, and Myasthenia Gravis patients 2. Is GFR >/= 30 ml/min? Yes.    Patient's GFR today is >60 3. Is SCr </= 2? Yes.   Patient's SCr is 0.71 mg/dL 4. Did SCr increase >/= 0.5 in 24 hours? No. 5.Pt's weight >40kg  Yes.   6. Abnormal electrolyte(s):   K 3.5, Phos 2.1, Mg 1.9  7. Electrolytes replaced per protocol 8.  Call MD STAT for K+ </= 2.5, Phos </= 1, or Mag </= 1 Physician:  Shawn Stall R Ozias Dicenzo 12/19/2022 6:39 AM

## 2022-12-19 NOTE — Progress Notes (Addendum)
Physical Therapy Treatment Patient Details Name: [name] MRN: [id number] DOB: Nov 21, 1950 Today's Date: 12/19/2022   History of Present Illness [name] is a 72 y.o. male who presents from SNF for AMS. Found to have severe sepsis due to complicated UTI, acute metabolic encephalopathy and AKI.  PMH includes alcohol abuse, TBI-chronic dysarthria, HTN, HFpEF, schizophrenia, anxiety/depression, homeless, left shoulder fracture with malunion, several recent admissions    PT Comments    Pt remains on levophed and BP intermittently soft, PT session limited by this. Pt following one-step commands with increased time throughout session, remains difficult to understand and does appear to make off-topic comments. Pt placed in chair position for session for pressure relief and to promote trunk control, pt with BP drop to 97/54 (66) with reports of dizziness after approximately 5 minutes in seated position, returned to 109/53(69) with return to supine and symptoms resolved. Pt tolerated LE exercise well, and pulled to sit x3 times with max PT assist. PT to continue to follow.   Pt on RA prior to PT arrival, SPO2 88-96% on RA throughout session     Recommendations for follow up therapy are one component of a multi-disciplinary discharge planning process, led by the attending physician.  Recommendations may be updated based on patient status, additional functional criteria and insurance authorization.  Follow Up Recommendations       Assistance Recommended at Discharge Frequent or constant Supervision/Assistance  Patient can return home with the following Two people to help with walking and/or transfers;Two people to help with bathing/dressing/bathroom;Assistance with cooking/housework;Assistance with feeding;Direct supervision/assist for medications management;Direct supervision/assist for financial management;Assist for transportation;Help with stairs or ramp for entrance   Equipment  Recommendations  None recommended by PT    Recommendations for Other Services       Precautions / Restrictions Precautions Precautions: Fall Precaution Comments: left buttock wound, incontinent BMs, catheter Restrictions Weight Bearing Restrictions: No     Mobility  Bed Mobility Overal bed mobility: Needs Assistance Bed Mobility: Supine to Sit, Sit to Supine           General bed mobility comments: pull to sit in chair position - max assist for completion of trunk rise, pt with good initiation once PT assisted pt in plaecment on RUE on bedrail. pull to sit x3.    Transfers                   General transfer comment: nt - on levophed with soft BP    Ambulation/Gait                   Stairs             Wheelchair Mobility    Modified Rankin (Stroke Patients Only)       Balance Overall balance assessment: Needs assistance Sitting-balance support: Single extremity supported Sitting balance-Leahy Scale: Poor Sitting balance - Comments: can support self briefly in pull to sit, requires UE support                                    Cognition Arousal/Alertness: Awake/alert Behavior During Therapy: Flat affect Overall Cognitive Status: Difficult to assess                                 General Comments: follows most simple commands, attempting to initiate mobility and  answer questions and verbalize needs however he is difficult to understand        Exercises General Exercises - Lower Extremity Ankle Circles/Pumps: AROM, Both, 20 reps, Seated Short Arc Quad: AAROM, Both, 15 reps, Seated Hip ABduction/ADduction: AAROM, Both, 15 reps, Supine    General Comments General comments (skin integrity, edema, etc.): MAP 60s-70s throughout session, on levophed      Pertinent Vitals/Pain Pain Assessment Pain Assessment: Faces Faces Pain Scale: Hurts a little bit Pain Location: L shoulder Pain Descriptors /  Indicators: Discomfort, Grimacing Pain Intervention(s): Limited activity within patient's tolerance, Monitored during session, Repositioned    Home Living                          Prior Function            PT Goals (current goals can now be found in the care plan section) Acute Rehab PT Goals Patient Stated Goal: none stated PT Goal Formulation: Patient unable to participate in goal setting Time For Goal Achievement: 12/30/22 Potential to Achieve Goals: Fair Progress towards PT goals: Progressing toward goals    Frequency    Min 2X/week      PT Plan Current plan remains appropriate    Co-evaluation              AM-PAC PT "6 Clicks" Mobility   Outcome Measure  Help needed turning from your back to your side while in a flat bed without using bedrails?: A Lot Help needed moving from lying on your back to sitting on the side of a flat bed without using bedrails?: Total Help needed moving to and from a bed to a chair (including a wheelchair)?: Total Help needed standing up from a chair using your arms (e.g., wheelchair or bedside chair)?: Total Help needed to walk in hospital room?: Total Help needed climbing 3-5 steps with a railing? : Total 6 Click Score: 7    End of Session   Activity Tolerance: Treatment limited secondary to medical complications (Comment) (soft BP) Patient left: in bed;with call bell/phone within reach;with bed alarm set;with nursing/sitter in room (SCDs off upon PT arrival to room, left off) Nurse Communication: Mobility status PT Visit Diagnosis: Other abnormalities of gait and mobility (R26.89);Muscle weakness (generalized) (M62.81)     Time: 1610-9604 PT Time Calculation (min) (ACUTE ONLY): 20 min  Charges:  $Therapeutic Activity: 8-22 mins                     Marye Round, PT DPT Acute Rehabilitation Services Secure Chat Preferred  Office [phone]    [name] Sheliah Plane 12/19/2022, 3:34 PM

## 2022-12-19 NOTE — Progress Notes (Addendum)
eLink Physician-Brief Progress Note Patient Name: [name] DOB: 21-Nov-1950 MRN: [id number]   Date of Service  12/19/2022  HPI/Events of Note  Patient no longer has PICC Has levophed in PIV  Request for NTS  eICU Interventions  Changed to peripheral pressor order set  Orders for NTS PRN x 20 occurrences placed    11:00 PM. Request for PRN robinul for secretions. BID PRN ordered.                   Request for bilateral wrist restraints due to agitation. Ordered for 12 hours   5/19 1:25 AM Requesting melatonin for sleep. 3 mg nightly ordered  [name] 12/19/2022, 7:53 PM

## 2022-12-20 DIAGNOSIS — R6521 Severe sepsis with septic shock: Secondary | ICD-10-CM | POA: Diagnosis not present

## 2022-12-20 DIAGNOSIS — A419 Sepsis, unspecified organism: Secondary | ICD-10-CM | POA: Diagnosis not present

## 2022-12-20 LAB — GLUCOSE, CAPILLARY
Glucose-Capillary: 115 mg/dL — ABNORMAL HIGH (ref 70–99)
Glucose-Capillary: 107 mg/dL — ABNORMAL HIGH (ref 70–99)
Glucose-Capillary: 171 mg/dL — ABNORMAL HIGH (ref 70–99)
Glucose-Capillary: 156 mg/dL — ABNORMAL HIGH (ref 70–99)
Glucose-Capillary: 206 mg/dL — ABNORMAL HIGH (ref 70–99)
Glucose-Capillary: 220 mg/dL — ABNORMAL HIGH (ref 70–99)

## 2022-12-20 LAB — BASIC METABOLIC PANEL
Anion gap: 9 (ref 5–15)
BUN: 31 mg/dL — ABNORMAL HIGH (ref 8–23)
CO2: 29 mmol/L (ref 22–32)
Calcium: 8.1 mg/dL — ABNORMAL LOW (ref 8.9–10.3)
Chloride: 97 mmol/L — ABNORMAL LOW (ref 98–111)
Creatinine, Ser: 0.73 mg/dL (ref 0.61–1.24)
GFR, Estimated: >60 mL/min (ref >60)
Glucose, Bld: 132 mg/dL — ABNORMAL HIGH (ref 70–99)
Potassium: 4 mmol/L (ref 3.5–5.1)
Sodium: 135 mmol/L (ref 135–145)

## 2022-12-20 LAB — CBC
HCT: 33.3 % — ABNORMAL LOW (ref 39.0–52.0)
Hemoglobin: 10.9 g/dL — ABNORMAL LOW (ref 13.0–17.0)
MCH: 29 pg (ref 26.0–34.0)
MCHC: 32.7 g/dL (ref 30.0–36.0)
MCV: 88.6 fL (ref 80.0–100.0)
Platelets: 178 10*3/uL (ref 150–400)
RBC: 3.76 MIL/uL — ABNORMAL LOW (ref 4.22–5.81)
RDW: 15.3 % (ref 11.5–15.5)
WBC: 19.9 10*3/uL — ABNORMAL HIGH (ref 4.0–10.5)
nRBC: 0 % (ref 0.0–0.2)

## 2022-12-20 LAB — PHOSPHORUS: Phosphorus: 2 mg/dL — ABNORMAL LOW (ref 2.5–4.6)

## 2022-12-20 LAB — MAGNESIUM: Magnesium: 2 mg/dL (ref 1.7–2.4)

## 2022-12-20 MED ORDER — HYDROCORTISONE SOD SUC (PF) 100 MG IJ SOLR
100.0000 mg | Freq: Three times a day (TID) | INTRAMUSCULAR | Status: DC
  Administered 2022-12-20 – 2022-12-22 (×6): 100 mg via INTRAVENOUS
  Filled 2022-12-20 (×6): qty 2

## 2022-12-20 MED ORDER — CHLORHEXIDINE GLUCONATE CLOTH 2 % EX PADS
6.0000 | MEDICATED_PAD | Freq: Every day | CUTANEOUS | Status: DC
  Administered 2022-12-21 – 2022-12-23 (×3): 6 via TOPICAL

## 2022-12-20 MED ORDER — SODIUM PHOSPHATES 45 MMOLE/15ML IV SOLN
15.0000 mmol | Freq: Once | INTRAVENOUS | Status: AC
  Administered 2022-12-20: 15 mmol via INTRAVENOUS
  Filled 2022-12-20: qty 5

## 2022-12-20 MED ORDER — HEPARIN SODIUM (PORCINE) 5000 UNIT/ML IJ SOLN
5000.0000 [IU] | Freq: Three times a day (TID) | INTRAMUSCULAR | Status: DC
  Administered 2022-12-20 – 2022-12-23 (×11): 5000 [IU] via SUBCUTANEOUS
  Filled 2022-12-20 (×11): qty 1

## 2022-12-20 MED ORDER — MELATONIN 3 MG PO TABS
3.0000 mg | ORAL_TABLET | Freq: Every day | ORAL | Status: DC
  Administered 2022-12-20 (×2): 3 mg via ORAL
  Filled 2022-12-20 (×2): qty 1

## 2022-12-20 MED ORDER — MIDODRINE HCL 5 MG PO TABS
30.0000 mg | ORAL_TABLET | Freq: Three times a day (TID) | ORAL | Status: DC
  Administered 2022-12-20 – 2022-12-22 (×5): 30 mg
  Filled 2022-12-20 (×6): qty 6

## 2022-12-20 NOTE — Progress Notes (Signed)
[facility] ADULT ICU REPLACEMENT PROTOCOL   The patient does apply for the [facility] Adult ICU Electrolyte Replacment Protocol based on the criteria listed below:   1.Exclusion criteria: TCTS, ECMO, Dialysis, and Myasthenia Gravis patients 2. Is GFR >/= 30 ml/min? Yes.    Patient's GFR today is >60 3. Is SCr </= 2? Yes.   Patient's SCr is 0.73 mg/dL 4. Did SCr increase >/= 0.5 in 24 hours? No. 5.Pt's weight >40kg  Yes.   6. Abnormal electrolyte(s): Phos 2.0  7. Electrolytes replaced per protocol 8.  Call MD STAT for K+ </= 2.5, Phos </= 1, or Mag </= 1 Physician:  Dr Elvia Collum 12/20/2022 4:34 AM

## 2022-12-20 NOTE — Progress Notes (Signed)
MD made aware of limited access and pts increase requirement of Levo. New orders in

## 2022-12-20 NOTE — Progress Notes (Signed)
PHARMACY - PHYSICIAN COMMUNICATION CRITICAL VALUE ALERT - BLOOD CULTURE IDENTIFICATION (BCID)  [name] is an 72 y.o. male who presented to [facility] on 12/15/2022 with a chief complaint of MRSA bacteremia  Assessment:  Repeat cultures with 1 out of 4 GPC clusters. Micro not running BCID since known MRSA bacteremia.   Name of physician (or Provider) Contacted: Dr Isaiah Serge  Current antibiotics: Vancomycin   Changes to prescribed antibiotics recommended:  Patient is on recommended antibiotics - No changes needed  Results for orders placed or performed during the hospital encounter of 12/15/22  Blood Culture ID Panel (Reflexed) (Collected: 12/15/2022 12:12 PM)  Result Value Ref Range   Enterococcus faecalis NOT DETECTED NOT DETECTED   Enterococcus Faecium NOT DETECTED NOT DETECTED   Listeria monocytogenes NOT DETECTED NOT DETECTED   Staphylococcus species DETECTED (A) NOT DETECTED   Staphylococcus aureus (BCID) DETECTED (A) NOT DETECTED   Staphylococcus epidermidis NOT DETECTED NOT DETECTED   Staphylococcus lugdunensis NOT DETECTED NOT DETECTED   Streptococcus species NOT DETECTED NOT DETECTED   Streptococcus agalactiae NOT DETECTED NOT DETECTED   Streptococcus pneumoniae NOT DETECTED NOT DETECTED   Streptococcus pyogenes NOT DETECTED NOT DETECTED   A.calcoaceticus-baumannii NOT DETECTED NOT DETECTED   Bacteroides fragilis NOT DETECTED NOT DETECTED   Enterobacterales NOT DETECTED NOT DETECTED   Enterobacter cloacae complex NOT DETECTED NOT DETECTED   Escherichia coli NOT DETECTED NOT DETECTED   Klebsiella aerogenes NOT DETECTED NOT DETECTED   Klebsiella oxytoca NOT DETECTED NOT DETECTED   Klebsiella pneumoniae NOT DETECTED NOT DETECTED   Proteus species NOT DETECTED NOT DETECTED   Salmonella species NOT DETECTED NOT DETECTED   Serratia marcescens NOT DETECTED NOT DETECTED   Haemophilus influenzae NOT DETECTED NOT DETECTED   Neisseria meningitidis NOT DETECTED NOT DETECTED    Pseudomonas aeruginosa NOT DETECTED NOT DETECTED   Stenotrophomonas maltophilia NOT DETECTED NOT DETECTED   Candida albicans NOT DETECTED NOT DETECTED   Candida auris NOT DETECTED NOT DETECTED   Candida glabrata NOT DETECTED NOT DETECTED   Candida krusei NOT DETECTED NOT DETECTED   Candida parapsilosis NOT DETECTED NOT DETECTED   Candida tropicalis NOT DETECTED NOT DETECTED   Cryptococcus neoformans/gattii NOT DETECTED NOT DETECTED   Meth resistant mecA/C and MREJ DETECTED (A) NOT DETECTED    [name] 12/20/2022  10:51 AM

## 2022-12-20 NOTE — Progress Notes (Addendum)
NAME:  [name], MRN:  [id number], DOB:  02-27-1951, LOS: 5 ADMISSION DATE:  12/15/2022, CONSULTATION DATE:  12/15/2022 REFERRING MD:  Dr. Jerral Ralph, CHIEF COMPLAINT:  Sepsis   History of Present Illness:  [name] is a 72 year old male with past medical history of TBI with chronic dysarthria, schizophrenia, and recent hospitalization for sepsis complicated by hypercarbia who presented to the ED from a skilled nursing facility for evaluation of altered mental status. Concern at the nursing home was for generalized weakness and worsening confusion. Attempted to call nursing facility however did not get an answer.   In the ED, he was febrile to 104, tachycardic to 134, hypotensive, and was found to have purulent urine in his foley catheter. He was given 3L of IV fluids, as well as started on empiric antibiotics. Due to persistently low MAPs after 3L, PCCM was consulted for evaluation for pressor support.   Pertinent  Medical History  HTN Schizophrenia  Anxiety/Depression TBI - chronic dysarthria  Recent hospitalization from 4/9-4/25 for sepsis complicated by hypercarbia  Significant Hospital Events: Including procedures, antibiotic start and stop dates in addition to other pertinent events   5/14 - Started on Cefepime and Vancomycin  5/14 - Started on Low Dose Levo  5/15- bcx2 growing MRSA; on 20 levo and 0.03 vaso; picc line placed Echo w/ lvef 55-60%; severely enlarged right ventricle; no evidence of endocardidtis 5/17 levo down to 10. Failed swallow. Cortrak in place. PICC line removed  Interim History / Subjective:   Remains on peripheral Levophed.  Objective   Blood pressure (!) 115/59, pulse 90, temperature 98.8 F (37.1 C), resp. rate 18, height 6\' 2"  (1.88 m), weight 85.7 kg, SpO2 95 %.        Intake/Output Summary (Last 24 hours) at 12/20/2022 0837 Last data filed at 12/20/2022 0800 Gross per 24 hour  Intake 3017.82 ml  Output 2925 ml  Net 92.82 ml    Filed Weights   12/17/22 0500 12/18/22 0500 12/19/22 0500  Weight: 86.4 kg 83.1 kg 85.7 kg    Examination:  Gen:      No acute distress, chronically ill appearing HEENT:  EOMI, sclera anicteric Neck:     No masses; no thyromegaly Lungs:    Clear to auscultation bilaterally; normal respiratory effort CV:         Regular rate and rhythm; no murmurs Abd:      + bowel sounds; soft, non-tender; no palpable masses, no distension Ext:    No edema; adequate peripheral perfusion Skin:      Warm and dry; no rash Neuro: Awake, does not answer commands  Labs/imaging reviewed Significant for creatinine 0.73, phosphorus 2.0 WBC count increased to 19.9, hemoglobin 10.9 Platelets improved to 178  Resolved Hospital Problem list   NA  Assessment & Plan:   Septic shock present on admission Complicated UTI from chronic indwelling foley: foley exchanged on presentation.  MRSA bacteremia P: Wean pressors as tolerated.  Increase midodrine Start stress dose steroids. IVF bolus Continue vancomycin per ID recommendation Discontinued ceftriaxone per ID Line holiday over weekend. May need PICC tomorrow if he is still on pressors TEE planned for Monday  AKI Hypokalemia - CT Abdomen without hydronephrosis  P: Monitor and replete lites.  Acute metabolic encephalopathy CT head without any acute abnormality P: Vent minimize sedating medications, monitor  Hyperglycemia -A1c 5/16 5.9 P: -Cont ssi and cbg monitoring  Chronic Indwelling Foley Cath  Failed voiding trial with prior hospitalization -  Foley Catheter inserted at that time for outpatient urology evaluation  P: -foley changed out on admission -monitor UOP -hold finasteride for now Continues to have mild hematuria from Foley insertion.  Chronic HFpEF  Last echo showed grade 1 diastolic dysfunction, with an EF of 60-65%. Does not seem volume overloaded on exam.; repeat echo 5/15 lvef 55-60%; severely dilated RV P: -daily  weights; strict I/O's  Sacral Ulcer present on admission Wound care as necessary. Was septic from this during previous hospitalization. Currently non-bleeding, no discharge present, appears to be healing well P: -WOC consult signed off  History of EtOH use  Ethanol wnl P: -resume home thiamine  Schizophrenia/PTSD  P: -cont home zyprexa and buspar   Anemia Thrombocytopenia -likely sepsis related P: -trend cbc  Best Practice (right click and "Reselect all SmartList Selections" daily)   Diet/type: tubefeeds; slp following DVT prophylaxis: prophylactic heparin ; Monitor hematuria GI prophylaxis: N/A Lines: N/A Foley:  Yes, and it is still needed Code Status:  full code Last date of multidisciplinary goals of care discussion: 5/19 attempted to contact friend [name] over phone but no answer  Critical care time:    The patient is critically ill with multiple organ system failure and requires high complexity decision making for assessment and support, frequent evaluation and titration of therapies, advanced monitoring, review of radiographic studies and interpretation of complex data.   Critical Care Time devoted to patient care services, exclusive of separately billable procedures, described in this note is 35 minutes.   Chilton Greathouse MD [facility] See Amion for pager  If no response to pager , please call [phone] until 7pm After 7:00 pm call Elink  [phone] 12/20/2022, 8:37 AM

## 2022-12-21 ENCOUNTER — Inpatient Hospital Stay (HOSPITAL_COMMUNITY): Payer: Medicare Other

## 2022-12-21 ENCOUNTER — Inpatient Hospital Stay (HOSPITAL_COMMUNITY): Payer: Medicare Other | Admitting: General Practice

## 2022-12-21 ENCOUNTER — Encounter (HOSPITAL_COMMUNITY): Payer: Self-pay | Admitting: Pulmonary Disease

## 2022-12-21 ENCOUNTER — Encounter (HOSPITAL_COMMUNITY)
Admission: EM | Disposition: A | Discharge status: Skilled Nursing Facility | Payer: Self-pay | Source: Home / Self Care | Attending: Internal Medicine

## 2022-12-21 DIAGNOSIS — B9562 Methicillin resistant Staphylococcus aureus infection as the cause of diseases classified elsewhere: Secondary | ICD-10-CM | POA: Insufficient documentation

## 2022-12-21 DIAGNOSIS — R7881 Bacteremia: Secondary | ICD-10-CM

## 2022-12-21 DIAGNOSIS — F17210 Nicotine dependence, cigarettes, uncomplicated: Secondary | ICD-10-CM

## 2022-12-21 DIAGNOSIS — A419 Sepsis, unspecified organism: Secondary | ICD-10-CM | POA: Diagnosis not present

## 2022-12-21 DIAGNOSIS — A4102 Sepsis due to Methicillin resistant Staphylococcus aureus: Secondary | ICD-10-CM | POA: Diagnosis not present

## 2022-12-21 DIAGNOSIS — D649 Anemia, unspecified: Secondary | ICD-10-CM

## 2022-12-21 DIAGNOSIS — F319 Bipolar disorder, unspecified: Secondary | ICD-10-CM

## 2022-12-21 DIAGNOSIS — R6521 Severe sepsis with septic shock: Secondary | ICD-10-CM | POA: Diagnosis not present

## 2022-12-21 HISTORY — PX: TEE WITHOUT CARDIOVERSION: SHX5443

## 2022-12-21 LAB — GLUCOSE, CAPILLARY
Glucose-Capillary: 160 mg/dL — ABNORMAL HIGH (ref 70–99)
Glucose-Capillary: 139 mg/dL — ABNORMAL HIGH (ref 70–99)
Glucose-Capillary: 133 mg/dL — ABNORMAL HIGH (ref 70–99)
Glucose-Capillary: 129 mg/dL — ABNORMAL HIGH (ref 70–99)
Glucose-Capillary: 128 mg/dL — ABNORMAL HIGH (ref 70–99)
Glucose-Capillary: 170 mg/dL — ABNORMAL HIGH (ref 70–99)

## 2022-12-21 LAB — CBC
HCT: 34.7 % — ABNORMAL LOW (ref 39.0–52.0)
Hemoglobin: 11.2 g/dL — ABNORMAL LOW (ref 13.0–17.0)
MCH: 28.4 pg (ref 26.0–34.0)
MCHC: 32.3 g/dL (ref 30.0–36.0)
MCV: 87.8 fL (ref 80.0–100.0)
Platelets: 263 10*3/uL (ref 150–400)
RBC: 3.95 MIL/uL — ABNORMAL LOW (ref 4.22–5.81)
RDW: 15.1 % (ref 11.5–15.5)
WBC: 17.4 10*3/uL — ABNORMAL HIGH (ref 4.0–10.5)
nRBC: 0 % (ref 0.0–0.2)

## 2022-12-21 LAB — BASIC METABOLIC PANEL
Anion gap: 9 (ref 5–15)
BUN: 28 mg/dL — ABNORMAL HIGH (ref 8–23)
CO2: 28 mmol/L (ref 22–32)
Calcium: 8.3 mg/dL — ABNORMAL LOW (ref 8.9–10.3)
Chloride: 98 mmol/L (ref 98–111)
Creatinine, Ser: 0.74 mg/dL (ref 0.61–1.24)
GFR, Estimated: >60 mL/min (ref >60)
Glucose, Bld: 258 mg/dL — ABNORMAL HIGH (ref 70–99)
Potassium: 3.8 mmol/L (ref 3.5–5.1)
Sodium: 135 mmol/L (ref 135–145)

## 2022-12-21 LAB — CULTURE, BLOOD (ROUTINE X 2)

## 2022-12-21 LAB — ECHO TEE

## 2022-12-21 LAB — LACTIC ACID, PLASMA: Lactic Acid, Venous: 1.3 mmol/L (ref 0.5–1.9)

## 2022-12-21 SURGERY — ECHOCARDIOGRAM, TRANSESOPHAGEAL
Anesthesia: Monitor Anesthesia Care

## 2022-12-21 MED ORDER — LIDOCAINE 2% (20 MG/ML) 5 ML SYRINGE
INTRAMUSCULAR | Status: DC | PRN
  Administered 2022-12-21: 100 mg via INTRAVENOUS

## 2022-12-21 MED ORDER — MELATONIN 3 MG PO TABS
3.0000 mg | ORAL_TABLET | Freq: Every day | ORAL | Status: DC
  Administered 2022-12-21 – 2022-12-22 (×2): 3 mg
  Filled 2022-12-21 (×2): qty 1

## 2022-12-21 MED ORDER — PROPOFOL 500 MG/50ML IV EMUL
INTRAVENOUS | Status: DC | PRN
  Administered 2022-12-21: 100 ug/kg/min via INTRAVENOUS

## 2022-12-21 MED ORDER — POTASSIUM CHLORIDE 20 MEQ PO PACK
20.0000 meq | PACK | Freq: Once | ORAL | Status: AC
  Administered 2022-12-21: 20 meq
  Filled 2022-12-21: qty 1

## 2022-12-21 MED ORDER — PROPOFOL 10 MG/ML IV BOLUS
INTRAVENOUS | Status: DC | PRN
  Administered 2022-12-21: 30 mg via INTRAVENOUS
  Administered 2022-12-21: 20 mg via INTRAVENOUS

## 2022-12-21 NOTE — Progress Notes (Signed)
Patient's orientation level waxing and waning most of the night. Patient alert to self, alert to situation but inconsistent with location and time. RN unable to get consent at this time due to altered mental status.

## 2022-12-21 NOTE — Transfer of Care (Signed)
Immediate Anesthesia Transfer of Care Note  Patient: [name]  Procedure(s) Performed: TRANSESOPHAGEAL ECHOCARDIOGRAM  Patient Location: Cath Lab  Anesthesia Type:MAC  Level of Consciousness: drowsy  Airway & Oxygen Therapy: Patient Spontanous Breathing and Patient connected to nasal cannula oxygen  Post-op Assessment: Report given to RN and Post -op Vital signs reviewed and stable  Post vital signs: Reviewed and stable  Last Vitals:  Vitals Value Taken Time  BP 95/46   Temp    Pulse 65   Resp 18   SpO2 96     Last Pain:  Vitals:   12/21/22 1200  TempSrc: Axillary  PainSc:       Patients Stated Pain Goal: 0 (12/21/22 0800)  Complications: No notable events documented.

## 2022-12-21 NOTE — Anesthesia Preprocedure Evaluation (Addendum)
Anesthesia Evaluation  Patient identified by MRN, date of birth, ID band Patient awake    Reviewed: Allergy & Precautions, NPO status , Patient's Chart, lab work & pertinent test results  History of Anesthesia Complications (+) DIFFICULT AIRWAY and history of anesthetic complications (Glidescope used successfully in past)  Airway Mallampati: III  TM Distance: >3 FB Neck ROM: Full    Dental no notable dental hx.    Pulmonary Current Smoker   Pulmonary exam normal        Cardiovascular Normal cardiovascular exam     Neuro/Psych   Anxiety  Bipolar Disorder Schizophrenia     GI/Hepatic   Endo/Other    Renal/GU ARFRenal disease     Musculoskeletal   Abdominal   Peds  Hematology  (+) Blood dyscrasia (Hgb 11.2), anemia   Anesthesia Other Findings bacteremia  Reproductive/Obstetrics                             Anesthesia Physical Anesthesia Plan  ASA: 3  Anesthesia Plan: MAC   Post-op Pain Management: Minimal or no pain anticipated   Induction:   PONV Risk Score and Plan: 0 and Treatment may vary due to age or medical condition, Propofol infusion and TIVA  Airway Management Planned: Natural Airway and Nasal Cannula  Additional Equipment: None  Intra-op Plan:   Post-operative Plan:   Informed Consent: I have reviewed the patients History and Physical, chart, labs and discussed the procedure including the risks, benefits and alternatives for the proposed anesthesia with the patient or authorized representative who has indicated his/her understanding and acceptance.       Plan Discussed with: CRNA  Anesthesia Plan Comments:        Anesthesia Quick Evaluation

## 2022-12-21 NOTE — Progress Notes (Signed)
[facility]  Date of Admission:  12/15/2022     Principal Problem:   Sepsis (HCC) Active Problems:   Schizophrenia (HCC)   Mixed hyperlipidemia   Essential hypertension   Alcohol dependence in remission (HCC)   PTSD (post-traumatic stress disorder)   AKI (acute kidney injury) (HCC)   Encephalopathy acute   Septic shock (HCC)   Hyperglycemia   Malnutrition of moderate degree          Assessment: 72 year old male with history of dementia, schizophrenia, recent admission for a sepsis due to sacral decubitus ulcers treated with 7 days antibiotics (4/9-4/25/24).  Course complicated by acute hypoxic hypercapnic respiratory failure after getting IV Ativan requiring ICU transfer and ultimately readmitted from [facility] 12/15/22 for altered mental status found to have:   #MRSA bacteremia #Sacral wounds #Previous ORIF screws and ileum and into sacrum across the left SI joint #AMS acute metabolic encephalopathy resolved - Presented with temp of 104, WBC 18 K.  CT abdomen pelvis showed no acute abnormalities.  Has history noted purulent drainage from Foley catheter, decompressed on imaging.  He was admitted for sepsis secondary to UTI.  ID engaged as cultures grew MRSA.  Foley catheter was placed at last admission as he failed voiding trial. - PICC placed on 5/14 but pulled on 5/17 - Blood cultures on 5/14 grew MRSA, Urine CX+ MRSA(which is spill over form blood, as urine is not source of bacteremia -- wound doesn't appear infected but source of translocation likely - TTE on 5/15 technically difficult-> no vegetation. CT Maxilla-facial showed no abscess.  - 5/15 repeat bcx grow 1 of 2 set reported on 5/19  Recommendations: - Continue vancomycin - repeat blood cultures today - TEE today - continue wound care - I will repeat abd pelv ct with contrast this week given modererately elevated wbc and prolonged fever and concern for developing prostatic abscess --  discussed with Dr Celine Mans of pulm/ccm --> order for tomorrow 5/21    #Remote history of left shoulder fracture after mechanical fall in August 2021    Microbiology:   Antibiotics: Vancomycin and cefepime 5/14-current   Cultures: Blood 5/14 2/2 MRSA 5/15 1/2 mrsa 5/20 pending   SUBJECTIVE: Fever curved improve and afebrile since 5/19 Moderate leukocytosis still Patient still on levophed low dose On tube feed No o2 supplement requirement No complaint today outside of constipation  Review of Systems: Review of Systems  All other systems reviewed and are negative.    Scheduled Meds:  busPIRone  15 mg Per Tube TID   Chlorhexidine Gluconate Cloth  6 each Topical Daily   ezetimibe  10 mg Per Tube Daily   feeding supplement  237 mL Oral BID BM   feeding supplement (PROSource TF20)  60 mL Per Tube BID   folic acid  1 mg Per Tube Daily   heparin injection (subcutaneous)  5,000 Units Subcutaneous Q8H   hydrocortisone sod succinate (SOLU-CORTEF) inj  100 mg Intravenous Q8H   insulin aspart  0-15 Units Subcutaneous Q4H   melatonin  3 mg Per Tube QHS   midodrine  30 mg Per Tube Q8H   multivitamin with minerals  1 tablet Per Tube Daily   nutrition supplement (JUVEN)  1 packet Per Tube BID BM   OLANZapine  20 mg Per Tube QHS   rosuvastatin  40 mg Per Tube Daily   sodium chloride flush  10-40 mL Intracatheter Q12H   thiamine  100 mg  Per Tube Daily   Continuous Infusions:  sodium chloride     sodium chloride 10 mL/hr at 12/21/22 0600   feeding supplement (OSMOLITE 1.5 CAL) Stopped (12/21/22 0000)   norepinephrine (LEVOPHED) Adult infusion 4 mcg/min (12/21/22 0922)   vancomycin 750 mg (12/21/22 0837)   PRN Meds:.Place/Maintain arterial line **AND** sodium chloride, acetaminophen **OR** acetaminophen, albuterol, docusate, glycopyrrolate, ondansetron **OR** ondansetron (ZOFRAN) IV, polyethylene glycol, sodium chloride flush Allergies  Allergen Reactions   Penicillins Other (See  Comments)    Unsure of reaction, had allergic reaction as a child and has not taken since then    OBJECTIVE: Vitals:   12/21/22 0630 12/21/22 0645 12/21/22 0700 12/21/22 0800  BP: 109/66 (!) 107/58 (!) 132/56   Pulse: 97 93 61   Resp: (!) 22 (!) 23 18   Temp: 97.9 F (36.6 C) 97.9 F (36.6 C) 97.9 F (36.6 C) 97.9 F (36.6 C)  TempSrc:      SpO2: 95% 91% 95%   Weight:      Height:       Body mass index is 23.92 kg/m.  Physical exam: General/constitutional: no distress, pleasant, mumbling and trying to converse -- difficult to make out words; cooperative HEENT: Normocephalic, PER, Conj Clear, EOMI; feeding tube in place CV: rrr no mrg Lungs: clear to auscultation, normal respiratory effort Abd: Soft, Nontender Ext: no edema Skin: slight maceration left anterior knee no cellulitic change  Neuro: nonfocal -- generalized weakness MSK: no peripheral joint swelling/tenderness/warmth; back spines nontender  Gu: folley in place   Lab Results Lab Results  Component Value Date   WBC 17.4 (H) 12/21/2022   HGB 11.2 (L) 12/21/2022   HCT 34.7 (L) 12/21/2022   MCV 87.8 12/21/2022   PLT 263 12/21/2022    Lab Results  Component Value Date   CREATININE 0.74 12/21/2022   BUN 28 (H) 12/21/2022   NA 135 12/21/2022   K 3.8 12/21/2022   CL 98 12/21/2022   CO2 28 12/21/2022    Lab Results  Component Value Date   ALT 40 12/16/2022   AST 78 (H) 12/16/2022   ALKPHOS 58 12/16/2022   BILITOT 1.3 (H) 12/16/2022        Raymondo Band, MD [facility] 12/21/2022, 9:55 AM    I spent more than 50 minute reviewing data/chart, and coordinating care, providing direct face to face time providing counseling/discussing diagnostics/treatment plan with patient and treatment team

## 2022-12-21 NOTE — Progress Notes (Signed)
NAME:  [name], MRN:  [id number], DOB:  July 09, 1951, LOS: 6 ADMISSION DATE:  12/15/2022, CONSULTATION DATE:  12/15/2022 REFERRING MD:  Dr. Jerral Ralph, CHIEF COMPLAINT:  Sepsis   History of Present Illness:  [name]. Gilton is a 72 year old male with past medical history of TBI with chronic dysarthria, schizophrenia, and recent hospitalization for sepsis complicated by hypercarbia who presented to the ED from a skilled nursing facility for evaluation of altered mental status. Concern at the nursing home was for generalized weakness and worsening confusion. Attempted to call nursing facility however did not get an answer.   In the ED, he was febrile to 104, tachycardic to 134, hypotensive, and was found to have purulent urine in his foley catheter. He was given 3L of IV fluids, as well as started on empiric antibiotics. Due to persistently low MAPs after 3L, PCCM was consulted for evaluation for pressor support.   Pertinent  Medical History  HTN Schizophrenia  Anxiety/Depression TBI - chronic dysarthria  Recent hospitalization from 4/9-4/25 for sepsis complicated by hypercarbia  Significant Hospital Events: Including procedures, antibiotic start and stop dates in addition to other pertinent events   5/14 - Started on Cefepime and Vancomycin  5/14 - Started on Low Dose Levo  5/15- bcx2 growing MRSA; on 20 levo and 0.03 vaso; picc line placed Echo w/ lvef 55-60%; severely enlarged right ventricle; no evidence of endocardidtis 5/17 levo down to 10. Failed swallow. Cortrak in place. PICC line removed  Interim History / Subjective:   On 6 mcg norepinephrine and 30mg  q8 of midodrine.   Objective   Blood pressure (!) 132/56, pulse 61, temperature 97.9 F (36.6 C), resp. rate 18, height 6\' 2"  (1.88 m), weight 84.5 kg, SpO2 95 %.        Intake/Output Summary (Last 24 hours) at 12/21/2022 0809 Last data filed at 12/21/2022 0600 Gross per 24 hour  Intake 2268.27 ml  Output 3875 ml   Net -1606.73 ml   Filed Weights   12/18/22 0500 12/19/22 0500 12/21/22 0500  Weight: 83.1 kg 85.7 kg 84.5 kg    Examination:  Gen:      Acutely and chronically ill appearing HEENT:  dry mucus membranes poor dentition Lungs:    diminished, clear CV:         RRR no murmur Abd:      + bowel sounds; soft, non-tender; no palpable masses, no distension Ext:    No edema Skin:      Warm and dry; no rashes Neuro:   dysarthric, alert, oriented to person place and situation   Labs/imaging reviewed Na 135 K 3.8 Cr 0.74 WBC 17.4 Hgb 11.2  Resolved Hospital Problem list   Hypokalemia AKI Sepsis related thrombocytopenia  Assessment & Plan:   Septic shock present on admission Complicated UTI from chronic indwelling foley: foley exchanged on presentation.  MRSA bacteremia recent PICC line P: Continue midodrine, stress dose steroids. Continue vancomycin per ID recommendation On low dose pressors norepinephrine 6 mcg.  TEE planned for Monday Most recent cultures positive 5/15, will need repeat cultures at some point - can talk to ID about this today.  Acute metabolic encephalopathy History of TBI CT head without any acute abnormality P: Likely secondary to delirium and infection  Hyperglycemia -A1c 5/16 5.9 P: -Cont ssi and cbg monitoring, CBGs 140-180 at goal  Chronic Indwelling Foley Cath  Failed voiding trial with prior hospitalization - Foley Catheter inserted at that time for outpatient urology evaluation  P: -foley changed out on admission - failed voiding trial this admission  Chronic HFpEF  Last echo showed grade 1 diastolic dysfunction, with an EF of 60-65%. Does not seem volume overloaded on exam.; repeat echo 5/15 lvef 55-60%; severely dilated RV P: -daily weights; strict I/O's  Sacral Ulcer present on admission Wound care as necessary. Was septic from this during previous hospitalization. Currently non-bleeding, no discharge present, appears to be healing  well P: -WOC consult signed off  History of EtOH use  Ethanol wnl P: -resume home thiamine  Schizophrenia/PTSD  P: -cont home zyprexa and buspar   Anemia, acute on chronic -likely sepsis related, chronic disease, also had hematuria, but this has resolved P: -trend cbc, no signs of bleeding  Best Practice (right click and "Reselect all SmartList Selections" daily)   Diet/type: tubefeeds; slp following DVT prophylaxis: prophylactic heparin  GI prophylaxis: N/A Lines: N/A Foley:  Yes, and it is still needed Code Status:  full code Last date of multidisciplinary goals of care discussion: 5/19 attempted to contact friend [name] over phone but no answer. I asked him about if he had a healthcare POA- he says they have tried to get me to do this before but I'm worried that's because they want to withhold treatment on me. He says "[name] would not do me wrong" in the event he was unable to make his own medical decisions. He has no living family.   Critical care time:    The patient is critically ill due to septic shock.  Critical care was necessary to treat or prevent imminent or life-threatening deterioration.  Critical care was time spent personally by me on the following activities: development of treatment plan with patient and/or surrogate as well as nursing, discussions with consultants, evaluation of patient's response to treatment, examination of patient, obtaining history from patient or surrogate, ordering and performing treatments and interventions, ordering and review of laboratory studies, ordering and review of radiographic studies, pulse oximetry, re-evaluation of patient's condition and participation in multidisciplinary rounds.   Critical Care Time devoted to patient care services described in this note is 38 minutes. This time reflects time of care of this signee Charlott Holler . This critical care time does not reflect separately billable procedures or procedure time,  teaching time or supervisory time of PA/NP/Med student/Med Resident etc but could involve care discussion time.       Mickel [facility]as [facility] 12/21/2022 8:09 AM  Pager: see AMION  If no response to pager , please call critical care on call (see AMION) until 7pm After 7:00 pm call Elink

## 2022-12-21 NOTE — Plan of Care (Signed)
  Problem: Education: Goal: Knowledge of General Education information will improve Description: Including pain rating scale, medication(s)/side effects and non-pharmacologic comfort measures Outcome: Progressing   Problem: Health Behavior/Discharge Planning: Goal: Ability to manage health-related needs will improve Outcome: Progressing   Problem: Clinical Measurements: Goal: Ability to maintain clinical measurements within normal limits will improve Outcome: Progressing Goal: Will remain free from infection Outcome: Progressing Goal: Diagnostic test results will improve Outcome: Progressing Goal: Respiratory complications will improve Outcome: Progressing Goal: Cardiovascular complication will be avoided Outcome: Progressing   Problem: Activity: Goal: Risk for activity intolerance will decrease Outcome: Progressing   Problem: Nutrition: Goal: Adequate nutrition will be maintained Outcome: Progressing   Problem: Coping: Goal: Level of anxiety will decrease Outcome: Progressing   Problem: Elimination: Goal: Will not experience complications related to bowel motility Outcome: Progressing Goal: Will not experience complications related to urinary retention Outcome: Progressing   Problem: Pain Managment: Goal: General experience of comfort will improve Outcome: Progressing   Problem: Safety: Goal: Ability to remain free from injury will improve Outcome: Progressing   Problem: Skin Integrity: Goal: Risk for impaired skin integrity will decrease Outcome: Progressing   Problem: Education: Goal: Ability to describe self-care measures that may prevent or decrease complications (Diabetes Survival Skills Education) will improve Outcome: Progressing   Problem: Education: Goal: Individualized Educational Video(s) Outcome: Progressing   Problem: Coping: Goal: Ability to adjust to condition or change in health will improve Outcome: Progressing   Problem: Fluid  Volume: Goal: Ability to maintain a balanced intake and output will improve Outcome: Progressing   Problem: Health Behavior/Discharge Planning: Goal: Ability to identify and utilize available resources and services will improve Outcome: Progressing Goal: Ability to manage health-related needs will improve Outcome: Progressing   Problem: Metabolic: Goal: Ability to maintain appropriate glucose levels will improve Outcome: Progressing   Problem: Nutritional: Goal: Maintenance of adequate nutrition will improve Outcome: Progressing Goal: Progress toward achieving an optimal weight will improve Outcome: Progressing   Problem: Skin Integrity: Goal: Risk for impaired skin integrity will decrease Outcome: Progressing   Problem: Tissue Perfusion: Goal: Adequacy of tissue perfusion will improve Outcome: Progressing   Problem: Fluid Volume: Goal: Hemodynamic stability will improve Outcome: Progressing   Problem: Clinical Measurements: Goal: Diagnostic test results will improve Outcome: Progressing Goal: Signs and symptoms of infection will decrease Outcome: Progressing   Problem: Respiratory: Goal: Ability to maintain adequate ventilation will improve Outcome: Progressing  Patient able to make needs known helps with moving in bed patient passed swallow exam today

## 2022-12-21 NOTE — Progress Notes (Signed)
1910 patient alert x4 able to make needs known call light in reach Levo running at IX watch in pace TF restarted by day RN 12/22/22 0030 patient SP02 drops to mid 80s and rebounds while sleeping 0100 2L [place] placed on patient for desaturation while sleeping

## 2022-12-21 NOTE — Anesthesia Procedure Notes (Signed)
Procedure Name: MAC Date/Time: 12/21/2022 2:08 PM  Performed by: Maxine Glenn, CRNAPre-anesthesia Checklist: Patient identified, Emergency Drugs available, Suction available and Patient being monitored Patient Re-evaluated:Patient Re-evaluated prior to induction Oxygen Delivery Method: Nasal cannula Airway Equipment and Method: Bite block

## 2022-12-21 NOTE — Anesthesia Postprocedure Evaluation (Signed)
Anesthesia Post Note  Patient: [name]  Procedure(s) Performed: TRANSESOPHAGEAL ECHOCARDIOGRAM     Patient location during evaluation: PACU Anesthesia Type: MAC Level of consciousness: awake and alert Pain management: pain level controlled Vital Signs Assessment: post-procedure vital signs reviewed and stable Respiratory status: spontaneous breathing, nonlabored ventilation, respiratory function stable and patient connected to nasal cannula oxygen Cardiovascular status: stable and blood pressure returned to baseline Postop Assessment: no apparent nausea or vomiting Anesthetic complications: no  No notable events documented.  Last Vitals:  Vitals:   12/21/22 1435 12/21/22 1445  BP: (!) 116/51 (!) 124/49  Pulse: 73 75  Resp: 20 16  Temp:    SpO2: 95% 96%    Last Pain:  Vitals:   12/21/22 1445  TempSrc:   PainSc: 0-No pain                 Reegan Bouffard S

## 2022-12-21 NOTE — Inpatient Diabetes Management (Signed)
Inpatient Diabetes Program Recommendations  AACE/ADA: New Consensus Statement on Inpatient Glycemic Control (2015)  Target Ranges:  Prepandial:   less than 140 mg/dL      Peak postprandial:   less than 180 mg/dL (1-2 hours)      Critically ill patients:  140 - 180 mg/dL   Lab Results  Component Value Date   GLUCAP 139 (H) 12/21/2022   HGBA1C 5.9 (H) 12/17/2022    Review of Glycemic Control  Latest Reference Range & Units 12/20/22 07:11 12/20/22 11:25 12/20/22 15:12 12/20/22 19:33 12/20/22 23:28 12/21/22 03:30 12/21/22 08:01  Glucose-Capillary 70 - 99 mg/dL 696 (H) 295 (H) 284 (H) 206 (H) 220 (H) 160 (H) 139 (H)   Diabetes history: Type 2 DM Outpatient Diabetes medications: none Current orders for Inpatient glycemic control: Novolog 0-15 units Q4H Osmolite @ 65 ml/hr  Solucortef 100 mg Q8 hours A1c 5.9% on 5/16  Inpatient Diabetes Program Recommendations:    -   Consider adding Novolog 3 units Q4H for tube feed coverage (to be stopped or held in the event tube feed stopped).   Thanks, Christena Deem RN, MSN, BC-ADM Inpatient Diabetes Coordinator Team Pager [phone] (8a-5p)

## 2022-12-21 NOTE — Progress Notes (Signed)
     Transesophageal Echocardiogram Note  [name] [id number] Aug 01, 1951  Procedure: Transesophageal Echocardiogram Indications: Bacteremia  Procedure Details Consent: Obtained Time Out: Verified patient identification, verified procedure, site/side was marked, verified correct patient position, special equipment/implants available, Radiology Safety Procedures followed,  medications/allergies/relevent history reviewed, required imaging and test results available.  Performed  Medications:  Pt sedated by anesthesia with diprovan 135 mg IV total.  Normal LV function; no vegetations.   Complications: No apparent complications Patient did tolerate procedure well.  [name] Millers, MD

## 2022-12-21 NOTE — Progress Notes (Signed)
OT Cancellation Note  Patient Details Name: [name] MRN: [id number] DOB: 1951/03/10   Cancelled Treatment:    Reason Eval/Treat Not Completed: Patient at procedure or test/ unavailable (TEE)  Donia Pounds 12/21/2022, 2:12 PM

## 2022-12-21 NOTE — Progress Notes (Signed)
Occupational Therapy Treatment Patient Details Name: [name] MRN: [id number] DOB: 1951-04-06 Today's Date: 12/21/2022   History of present illness [name] is a 72 y.o. male who presents from SNF for AMS. Found to have severe sepsis due to complicated UTI, acute metabolic encephalopathy and AKI.  PMH includes alcohol abuse, TBI-chronic dysarthria, HTN, HFpEF, schizophrenia, anxiety/depression, homeless, left shoulder fracture with malunion, several recent admissions   OT comments  Pt is making fair progress towards their acute OT goals. Pt recently returned to unit from TEE and declined OOB this session therefore session completed with bed>chair position. Notable improvement in pt's cognition and functional participation this date. Pt followed most simple 1 step commands for AROM and provided PLOF history. Pt denied dizziness in upright sitting and his BP was stable. See below. OT to continue to follow acutely to facilitate progress towards established goals. Pt will continue to benefit from skilled inpatient follow up therapy, <3 hours/day.  Orthostatic BPs Supine 110/51  Supported Sitting 103/53  Sitting after 5 min 109/61  Supine after session 104/59     Recommendations for follow up therapy are one component of a multi-disciplinary discharge planning process, led by the attending physician.  Recommendations may be updated based on patient status, additional functional criteria and insurance authorization.    Assistance Recommended at Discharge Frequent or constant Supervision/Assistance  Patient can return home with the following  A lot of help with walking and/or transfers;Two people to help with walking and/or transfers;A lot of help with bathing/dressing/bathroom;Two people to help with bathing/dressing/bathroom;Assistance with cooking/housework;Assistance with feeding;Direct supervision/assist for medications management;Direct supervision/assist for financial  management;Assist for transportation;Help with stairs or ramp for entrance   Equipment Recommendations  None recommended by OT       Precautions / Restrictions Precautions Precautions: Fall Precaution Comments: left buttock wound, incontinent BMs, catheter, watch BP Restrictions Weight Bearing Restrictions: No       Mobility Bed Mobility Overal bed mobility: Needs Assistance             General bed mobility comments: dependently moved bed>chair, max/total A for adjusting in bed due to L shoulder pain    Transfers                   General transfer comment: pt declined     Balance Overall balance assessment: Needs assistance Sitting-balance support: Single extremity supported Sitting balance-Leahy Scale: Poor Sitting balance - Comments: can support self briefly in pull to sit, requires UE support                                   ADL either performed or assessed with clinical judgement   ADL Overall ADL's : Needs assistance/impaired                 General ADL Comments: self limiting to bed>chair position with stable BP.    Extremity/Trunk Assessment Upper Extremity Assessment Upper Extremity Assessment: LUE deficits/detail RUE Deficits / Details: overall WFL, globally weak LUE Deficits / Details: malunion of shoulder, painful PROM. rigid elbow and wrist, hand ROM is WFL LUE: Unable to fully assess due to pain LUE Coordination: decreased gross motor   Lower Extremity Assessment Lower Extremity Assessment: Defer to PT evaluation        Vision   Vision Assessment?: Vision impaired- to be further tested in functional context Additional Comments: pt closing R eye with R gaze, he  reports it is a "habit" denies DV   Perception Perception Perception: Not tested   Praxis Praxis Praxis: Not tested    Cognition Arousal/Alertness: Awake/alert Behavior During Therapy: Flat affect Overall Cognitive Status: No family/caregiver  present to determine baseline cognitive functioning                                 General Comments: pt following most simple commands this date and declining OOB or EOB due to recent TEE. Pt reporting that he was up and walking at [facility] and managing his BADLs        Exercises   Shoulder Instructions   General Comments BP stable with upright positional change    Pertinent Vitals/ Pain       Pain Assessment Pain Assessment: Faces Faces Pain Scale: Hurts a little bit Pain Location: L shoulder Pain Descriptors / Indicators: Discomfort, Grimacing Pain Intervention(s): Limited activity within patient's tolerance, Monitored during session         Frequency  Min 2X/week        Progress Toward Goals  OT Goals(current goals can now be found in the care plan section)  Progress towards OT goals: Progressing toward goals  Acute Rehab OT Goals Patient Stated Goal: to rest OT Goal Formulation: With patient Time For Goal Achievement: 11/26/22 Potential to Achieve Goals: Good ADL Goals Pt Will Perform Grooming: with set-up Pt Will Perform Upper Body Dressing: with mod assist;sitting Pt Will Perform Lower Body Dressing: with mod assist;sit to/from stand Pt Will Transfer to Toilet: with mod assist;stand pivot transfer;bedside commode Pt Will Perform Toileting - Clothing Manipulation and hygiene: with min guard assist;sit to/from stand Additional ADL Goal #1: Pt will complete bed mobility with mod A as a precursor to ADLs  Plan Discharge plan remains appropriate       AM-PAC OT "6 Clicks" Daily Activity     Outcome Measure   Help from another person eating meals?: A Lot Help from another person taking care of personal grooming?: A Little Help from another person toileting, which includes using toliet, bedpan, or urinal?: Total Help from another person bathing (including washing, rinsing, drying)?: Total Help from another person to put on and taking off  regular upper body clothing?: A Lot Help from another person to put on and taking off regular lower body clothing?: Total 6 Click Score: 10    End of Session    OT Visit Diagnosis: Unsteadiness on feet (R26.81);Other abnormalities of gait and mobility (R26.89);Muscle weakness (generalized) (M62.81);History of falling (Z91.81);Repeated falls (R29.6);Other symptoms and signs involving cognitive function;Adult, failure to thrive (R62.7);Pain Pain - Right/Left: Left   Activity Tolerance Patient tolerated treatment well   Patient Left in bed;with call bell/phone within reach;with bed alarm set   Nurse Communication Mobility status        Time: 0981-1914 OT Time Calculation (min): 18 min  Charges: OT General Charges $OT Visit: 1 Visit OT Treatments $Therapeutic Activity: 8-22 mins  Derenda Mis, OTR/L Acute Rehabilitation Services Office [phone] Secure Chat Communication Preferred   Donia Pounds 12/21/2022, 5:03 PM

## 2022-12-22 ENCOUNTER — Inpatient Hospital Stay (HOSPITAL_COMMUNITY): Payer: Medicare Other

## 2022-12-22 ENCOUNTER — Encounter (HOSPITAL_COMMUNITY): Payer: Self-pay | Admitting: Cardiology

## 2022-12-22 DIAGNOSIS — A419 Sepsis, unspecified organism: Secondary | ICD-10-CM | POA: Diagnosis not present

## 2022-12-22 DIAGNOSIS — A4902 Methicillin resistant Staphylococcus aureus infection, unspecified site: Secondary | ICD-10-CM

## 2022-12-22 DIAGNOSIS — R6521 Severe sepsis with septic shock: Secondary | ICD-10-CM | POA: Diagnosis not present

## 2022-12-22 LAB — VANCOMYCIN, PEAK: Vancomycin Pk: 23 ug/mL — ABNORMAL LOW (ref 30–40)

## 2022-12-22 LAB — GLUCOSE, CAPILLARY
Glucose-Capillary: 216 mg/dL — ABNORMAL HIGH (ref 70–99)
Glucose-Capillary: 183 mg/dL — ABNORMAL HIGH (ref 70–99)
Glucose-Capillary: 199 mg/dL — ABNORMAL HIGH (ref 70–99)
Glucose-Capillary: 221 mg/dL — ABNORMAL HIGH (ref 70–99)
Glucose-Capillary: 205 mg/dL — ABNORMAL HIGH (ref 70–99)

## 2022-12-22 LAB — VANCOMYCIN, TROUGH: Vancomycin Tr: 13 ug/mL — ABNORMAL LOW (ref 15–20)

## 2022-12-22 MED ORDER — MIDODRINE HCL 5 MG PO TABS
20.0000 mg | ORAL_TABLET | Freq: Three times a day (TID) | ORAL | Status: DC
  Administered 2022-12-22 – 2022-12-23 (×4): 20 mg
  Filled 2022-12-22 (×4): qty 4

## 2022-12-22 MED ORDER — IOHEXOL 9 MG/ML PO SOLN
500.0000 mL | ORAL | Status: AC
  Administered 2022-12-22 (×2): 500 mL

## 2022-12-22 MED ORDER — PROSOURCE TF20 ENFIT COMPATIBL EN LIQD
60.0000 mL | Freq: Every day | ENTERAL | Status: DC
  Administered 2022-12-23: 60 mL
  Filled 2022-12-22: qty 60

## 2022-12-22 MED ORDER — ENSURE ENLIVE PO LIQD
237.0000 mL | Freq: Three times a day (TID) | ORAL | Status: DC
  Administered 2022-12-22 – 2022-12-23 (×3): 237 mL via ORAL

## 2022-12-22 MED ORDER — OSMOLITE 1.5 CAL PO LIQD
1000.0000 mL | ORAL | Status: DC
  Administered 2022-12-22: 1000 mL

## 2022-12-22 MED ORDER — IOHEXOL 350 MG/ML SOLN
75.0000 mL | Freq: Once | INTRAVENOUS | Status: AC | PRN
  Administered 2022-12-22: 75 mL via INTRAVENOUS

## 2022-12-22 NOTE — Progress Notes (Signed)
Id brief note   5/20 tee  1. No vegetations.   2. Left ventricular ejection fraction, by estimation, is 60 to 65%. The left ventricle has normal function. The left ventricle has no regional wall motion abnormalities.   3. Right ventricular systolic function is normal. The right ventricular size is normal.   4. No left atrial/left atrial appendage thrombus was detected.   5. The mitral valve is normal in structure. Trivial mitral valve  regurgitation. No evidence of mitral stenosis.   6. The aortic valve is normal in structure. Aortic valve regurgitation is trivial. No aortic stenosis is present.   7. There is Moderate (Grade III) plaque involving the descending aorta.     Vitals: Vitals:   12/22/22 0700 12/22/22 0800  BP: (!) 117/57 (!) 113/58  Pulse: 77 78  Resp: 14 17  Temp: 97.9 F (36.6 C) 97.9 F (36.6 C)  SpO2: 94% 95%   Fever resolved Levophed titrated off last evening    A/p Mrsa bsi Clinically improving; albeit slow Sacral wounds Sacral orif  Repeat bcx 5/20 ngtd Repeat bcx 5/15 mrsa 5/14 bcx mrsa   -await repeat abd pelv ct today -continue vancomycin -plan abx duration of treatment at least 4 weeks -- transition to oral abx at least 10 days out from 5/20 (if that doesn't grow any further) -plus/minus abx tail end (will decide after ct result) -- in setting orif

## 2022-12-22 NOTE — Progress Notes (Signed)
Pharmacy Antibiotic Note  [name] is a 72 y.o. male admitted on 12/15/2022 with MRSA bacteremia. Patient has a chronic foley catheter that has been exchanged and sacral wounds. WBC 17.4 on 5/20 and patient remains afebrile. SCr~0.7 is stable with CrCl ~ 100 ml/min.   Vancomycin trough level = 13 mcg/mL Vancomycin peak level = 23 mcg/mL >> calculated AUC = 438, therapeutic   Plan: Continue Vancomycin to 750 mg every 12 hours (for new calculated AUC 438)    > Goal AUC 400-550    > Check vancomycin levels at steady state  Monitor renal function, blood cultures and clinical improvement  Height: 6\' 2"  (188 cm) Weight: 83.2 kg (183 lb 6.8 oz) IBW/kg (Calculated) : 82.2  Temp (24hrs), Avg:97.5 F (36.4 C), Min:97 F (36.1 C), Max:97.9 F (36.6 C)  Recent Labs  Lab 12/15/22 2213 12/16/22 0533 12/17/22 0305 12/17/22 1455 12/18/22 0152 12/18/22 0447 12/18/22 1846 12/18/22 2312 12/19/22 0509 12/20/22 0036 12/21/22 0027 12/22/22 1128 12/22/22 2059  WBC  --    < > 15.2*  --  14.7*  --   --   --  14.9* 19.9* 17.4*  --   --   CREATININE  --    < > 0.98 0.81  --  0.74  --   --  0.71 0.73 0.74  --   --   LATICACIDVEN 1.9  --   --   --   --   --   --   --   --   --  1.3  --   --   VANCOTROUGH  --   --   --   --   --   --   --   --   --   --   --   --  13*  VANCOPEAK  --   --   --   --   --   --    < > 37  --   --   --  23*  --    < > = values in this interval not displayed.     Estimated Creatinine Clearance: 99.9 mL/min (by C-G formula based on SCr of 0.74 mg/dL).    Allergies  Allergen Reactions   Penicillins Other (See Comments)    Unsure of reaction, had allergic reaction as a child and has not taken since then   Antimicrobials during this admission: Cefepime 5/14 >> 5/15 Metronidazole 5/14 x1 Vancomycin 5/14 >>   Microbiology results: 5/20 Bld cx x2: no growth to date <24h 5/15 Bld cx x2: 2/4 (1 set) SA (sens pending), 2nd set no growth (final) 5/14 BCID:  MRSA 5/14 Bld cx: 4/4 MRSA (S: gentamicin, tetracycline, vancomycin, clindamycin, rifampin, linezolid) 5/14 MRSA PCR: detected 5/14 Ur cx: >/=100K - MRSA (S: gentamicin, nitrofurantoin, tetracycline, vancomycin, clindamycin, rifampin, linezolid)  Thank you for allowing pharmacy to be a part of this patient's care.  Wilburn Cornelia, PharmD, BCPS Clinical Pharmacist 12/22/2022 9:53 PM   Please refer to [facility] for pharmacy phone number

## 2022-12-22 NOTE — Progress Notes (Signed)
Speech Language Pathology Treatment: Dysphagia  Patient Details Name: [name] MRN: [id number] DOB: 12/05/1950 Today's Date: 12/22/2022 Time: 0940-1000 SLP Time Calculation (min) (ACUTE ONLY): 20 min  Assessment / Plan / Recommendation Clinical Impression  Patient seen by SLP for skilled treatment focused on dysphagia goals. Patient has been NPO since TEE previous date. Orders received for re-evaluation to determine if patient could advance with diet. Patient was awake and alert, continues to be dysarthric but when he speaks very slowly, speech can be understood. He was agreeable to having some PO's. He exhibited immediate and delayed cough with thin liquids (water) via cup sips but no immediate cough with nectar thick liquids, puree solids or mechanical soft solids. He did exhibit a couple instances of delayed coughing after all PO intake completed. Based on MBS completed 5/15, coughing is likely from penetration to vocal cords that was observed with thin liquid barium. SLP recommending to initiate PO diet of Dys 2 (minced) solids and nectar thick liquids. SLP will follow for toleration and ability to advance.   HPI HPI: Patient is a 72 y.o. male with PMH: anxiety, TBI, manic depression, schizo affective schizophrenia, dysphagia, chronic dysarthria. He was admitted last month and at time of discharge, recommended diet from SLP (following MBSx2) was Regular solids, honey thick liquids. Currently, patient presented to the hospital on 12/15/22 from SNF for evaluation of AMS. Re-assessed via MBS with full liq recommendation on 5/15. Cortrak was placed due to poor PO intake. On 12/21/22 he was made NPO for TEE and remained NPO awaiting SLP .      SLP Plan  Continue with current plan of care      Recommendations for follow up therapy are one component of a multi-disciplinary discharge planning process, led by the attending physician.  Recommendations may be updated based on patient status,  additional functional criteria and insurance authorization.    Recommendations  Diet recommendations: Dysphagia 2 (fine chop);Nectar-thick liquid Liquids provided via: Cup Medication Administration: Crushed with puree Supervision: Staff to assist with self feeding;Full supervision/cueing for compensatory strategies Compensations: Slow rate;Small sips/bites;Minimize environmental distractions Postural Changes and/or Swallow Maneuvers: Seated upright 90 degrees;Upright 30-60 min after meal                  Oral care BID;Staff/trained caregiver to provide oral care   Frequent or constant Supervision/Assistance Dysphagia, unspecified (R13.10)     Continue with current plan of care     Angela Nevin, MA, CCC-SLP Speech Therapy

## 2022-12-22 NOTE — Progress Notes (Addendum)
1908 patient alert x 4 on room air tube feeding running patient able to make needs known patient incontinent of stool complete Bath given all linens changed.  2100  the 2030 vancomycin level being drawn will wait on results per pharmacy before hanging ABT

## 2022-12-22 NOTE — Plan of Care (Signed)
  Problem: Education: Goal: Knowledge of General Education information will improve Description: Including pain rating scale, medication(s)/side effects and non-pharmacologic comfort measures Outcome: Progressing   Problem: Health Behavior/Discharge Planning: Goal: Ability to manage health-related needs will improve Outcome: Progressing   Problem: Clinical Measurements: Goal: Ability to maintain clinical measurements within normal limits will improve Outcome: Progressing Goal: Will remain free from infection Outcome: Progressing Goal: Diagnostic test results will improve Outcome: Progressing Goal: Respiratory complications will improve Outcome: Progressing Goal: Cardiovascular complication will be avoided Outcome: Progressing   Problem: Activity: Goal: Risk for activity intolerance will decrease Outcome: Progressing   Problem: Nutrition: Goal: Adequate nutrition will be maintained Outcome: Progressing   Problem: Elimination: Goal: Will not experience complications related to bowel motility Outcome: Progressing Goal: Will not experience complications related to urinary retention Outcome: Progressing   Problem: Pain Managment: Goal: General experience of comfort will improve Outcome: Progressing   Problem: Safety: Goal: Ability to remain free from injury will improve Outcome: Progressing   Problem: Skin Integrity: Goal: Risk for impaired skin integrity will decrease Outcome: Progressing   Problem: Education: Goal: Ability to describe self-care measures that may prevent or decrease complications (Diabetes Survival Skills Education) will improve Outcome: Progressing Goal: Individualized Educational Video(s) Outcome: Progressing   Problem: Coping: Goal: Ability to adjust to condition or change in health will improve Outcome: Progressing   Problem: Fluid Volume: Goal: Ability to maintain a balanced intake and output will improve Outcome: Progressing   Problem:  Health Behavior/Discharge Planning: Goal: Ability to identify and utilize available resources and services will improve Outcome: Progressing Goal: Ability to manage health-related needs will improve Outcome: Progressing   Problem: Metabolic: Goal: Ability to maintain appropriate glucose levels will improve Outcome: Progressing   Problem: Nutritional: Goal: Maintenance of adequate nutrition will improve Outcome: Progressing Goal: Progress toward achieving an optimal weight will improve Outcome: Progressing   Problem: Skin Integrity: Goal: Risk for impaired skin integrity will decrease Outcome: Progressing   Problem: Tissue Perfusion: Goal: Adequacy of tissue perfusion will improve Outcome: Progressing   Problem: Fluid Volume: Goal: Hemodynamic stability will improve Outcome: Progressing   Problem: Clinical Measurements: Goal: Diagnostic test results will improve Outcome: Progressing Goal: Signs and symptoms of infection will decrease Outcome: Progressing   Problem: Respiratory: Goal: Ability to maintain adequate ventilation will improve Outcome: Progressing
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
## 2022-12-22 NOTE — Progress Notes (Addendum)
NAME:  [name], MRN:  [id number], DOB:  Feb 22, 1951, LOS: 7 ADMISSION DATE:  12/15/2022, CONSULTATION DATE:  12/15/2022 REFERRING MD:  Dr. Jerral Ralph, CHIEF COMPLAINT:  Sepsis   History of Present Illness:  [name] is a 72 year old male with past medical history of TBI with chronic dysarthria, schizophrenia, and recent hospitalization for sepsis complicated by hypercarbia who presented to the ED from a skilled nursing facility for evaluation of altered mental status. Concern at the nursing home was for generalized weakness and worsening confusion. Attempted to call nursing facility however did not get an answer.   In the ED, he was febrile to 104, tachycardic to 134, hypotensive, and was found to have purulent urine in his foley catheter. He was given 3L of IV fluids, as well as started on empiric antibiotics. Due to persistently low MAPs after 3L, PCCM was consulted for evaluation for pressor support.   Pertinent  Medical History  HTN Schizophrenia  Anxiety/Depression TBI - chronic dysarthria  Recent hospitalization from 4/9-4/25 for sepsis complicated by hypercarbia  Significant Hospital Events: Including procedures, antibiotic start and stop dates in addition to other pertinent events   5/14 - Started on Cefepime and Vancomycin  5/14 - Started on Low Dose Levo  5/15- bcx2 growing MRSA; on 20 levo and 0.03 vaso; picc line placed Echo w/ lvef 55-60%; severely enlarged right ventricle; no evidence of endocardidtis 5/17 levo down to 10. Failed swallow. Cortrak in place. PICC line removed 5/20 TEE negative for endocarditis  Interim History / Subjective:   Off pressors. This morning worried about his close he left at "[address]  Objective   Blood pressure (!) 113/58, pulse 78, temperature 97.9 F (36.6 C), resp. rate 17, height 6\' 2"  (1.88 m), weight 83.2 kg, SpO2 95 %.        Intake/Output Summary (Last 24 hours) at 12/22/2022 4098 Last data filed at  12/22/2022 0600 Gross per 24 hour  Intake 1498.28 ml  Output 1265 ml  Net 233.28 ml   Filed Weights   12/19/22 0500 12/21/22 0500 12/22/22 0500  Weight: 85.7 kg 84.5 kg 83.2 kg    Examination:  Gen:      Chronically ill appearing HEENT:  poor dentition Lungs:   diminished, clear CV:         RRR Abd:      soft Ext:    No edema Skin:      Warm and dry; no rashes Neuro:   dysarthria, non focal exam, oriented to self and situation    Labs/imaging reviewed CBG 183,   Resolved Hospital Problem list   Hypokalemia AKI Sepsis related thrombocytopenia  Assessment & Plan:   Septic shock present on admission Complicated UTI from chronic indwelling foley: foley exchanged on presentation.  MRSA bacteremia recent PICC line P: decrease midodrine back to 20 mg TID home dose, stop stress dose steroids. Continue vancomycin per ID recommendation Off pressors today. TEE negative. cultures TEE planned for Monday Most recent cultures 5/19 positive for MRSA. Id following and CT A/P planned for today  Acute metabolic encephalopathy History of TBI CT head without any acute abnormality P: Likely secondary to delirium and infection  Hyperglycemia -A1c 5/16 5.9 P: -Cont ssi and cbg monitoring, CBGs slightly above goal but we are stopping stress dose steroids so hopefully this improves  Chronic Indwelling Foley Cath  Failed voiding trial with prior hospitalization - Foley Catheter inserted at that time for outpatient urology evaluation  P: -foley changed out on admission - failed voiding trial this admission  Chronic HFpEF  Last echo showed grade 1 diastolic dysfunction, with an EF of 60-65%. Does not seem volume overloaded on exam.; repeat echo 5/15 lvef 55-60%; severely dilated RV P: -daily weights; strict I/O's  Sacral Ulcer present on admission Wound care as necessary. Was septic from this during previous hospitalization. Currently non-bleeding, no discharge present, appears to  be healing well P: -WOC consult signed off  History of EtOH use  Ethanol wnl P: -resume home thiamine  Schizophrenia/PTSD  P: -cont home zyprexa and buspar   Anemia, acute on chronic -likely sepsis related, chronic disease, also had hematuria, but this has resolved P: -trend cbc, no signs of bleeding  Best Practice (right click and "Reselect all SmartList Selections" daily)   Diet/type: tubefeeds; will reach out to SLP to see if diet can be advanced  DVT prophylaxis: prophylactic heparin  GI prophylaxis: N/A Lines: N/A Foley:  Yes, and it is still needed Code Status:  full code Last date of multidisciplinary goals of care discussion: 5/19 attempted to contact friend [name] over phone but no answer. I asked him about if he had a healthcare POA- he says they have tried to get me to do this before but I'm worried that's because they want to withhold treatment on me. He says "[name] would not do me wrong" in the event he was unable to make his own medical decisions. He has no living family.   Will transfer out of ICU today. Will sign out to [facility] to assumec are 5/22.   I spent 50 minutes in total visit time for this patient, with more than 50% spent counseling/coordinating care.  Durel Salts, MD Pulmonary and Critical Care Medicine [facility], [facility] 12/22/2022 8:37 AM Pager: see AMION  If no response to pager, please call critical care on call (see AMION) until 7pm After 7:00 pm call Elink

## 2022-12-22 NOTE — Progress Notes (Signed)
Nutrition Follow-up  DOCUMENTATION CODES:   Non-severe (moderate) malnutrition in context of chronic illness  INTERVENTION:   Transition to nocturnal tube feeds via Cortrak: - Osmolite 1.5 @ 83 ml/hr x 12 hours from 1800 to 0600 (total of 996 ml) - PROSource TF20 60 ml daily  Nocturnal tube feeding regimen provides 1574 kcal, 82 grams of protein, and 759 ml of H2O (meets 66% of minimum kcal needs and 66% of minimum protein needs).  - Continue 1 packet Juven BID per tube to promote wound healing, each packet provides 95 calories, 2.5 grams of protein, and 9.8 grams of carbohydrate   - MVI with minerals daily   - Increase Ensure Enlive po to TID, thickened to appropriate consistency, each supplement provides 350 kcal and 20 grams of protein  NUTRITION DIAGNOSIS:   Moderate Malnutrition related to chronic illness (CHF, schizophrenia, TBI) as evidenced by mild fat depletion, moderate muscle depletion.  Ongoing, being addressed via PO diet, supplements, and tube feeds  GOAL:   Patient will meet greater than or equal to 90% of their needs  Progressing  MONITOR:   Supplement acceptance, PO intake, Diet advancement, Labs, Weight trends, Skin, TF tolerance  REASON FOR ASSESSMENT:   Consult Enteral/tube feeding initiation and management  ASSESSMENT:   72 year old male who presented to the ED on 5/14 with AMS. PMH of HTN, CHF, schizophrenia, homelessness, EtOH abuse, TBI, recent admission for sepsis complicated by hypercarbia. Pt admitted with severe sepsis likely cue to complicated UTI, acute metabolic encephalopathy, AKI, MRSA bacteremia.  05/15 - MBS with recommendations for full liquid diet, Cortrak placement (x-ray pending) 05/20 - TF held at midnight and NPO for TEE, s/p TEE (negative for endocarditis), TF resumed afterwards 05/21 - diet advanced to dysphagia 2 with nectar-thick liquids  Discussed pt with RN and during ICU rounds. Per RN, pt's Cortrak would not flush when  he returned from TEE yesterday. An abdominal x-ray was obtained which showed Cortrak projecting over the gastric body. Per RN, the tube was pulled out from 71 cm at the nare to 58 cm at the nare and was then able to be flushed. Another abdominal x-ray was obtained after this which showed Cortrak projecting over the gastric body, unchanged. Tube feeds were resumed. RN held tube feeds this morning due to pt needing contrast for CT.  Discussed pt with CCM MD who reviewed imaging. Okay to use tube in currently location. Cortrak team to return to bedside tomorrow to advance tube back to original location in distal stomach.  RN reports that pt consumed 100% of an Ensure this morning. Spoke with pt at bedside. He is wondering when lunch will arrive. RD to transition pt to nocturnal tube feeds at this time. Will increase Ensure from BID to TID.  Admit weight: 84 kg Current weight: 83.2 kg  Current TF: Osmolite 1.5 @ 65 ml/hr, PROSource TF20 60 ml BID  Medications reviewed and include: Ensure Enlive BID, folic acid, SSI q 4 hours, melatonin, MVI with minerals, Juven BID, zyprexa, thiamine, IV abx  Labs from 5/20 reviewed. CBG's: 128-216 x 24 hours  UOP: 1340 ml x 24 hours I/O's: +5.1 L since admit  Diet Order:   Diet Order             DIET DYS 2 Room service appropriate? Yes; Fluid consistency: Nectar Thick  Diet effective now                   EDUCATION NEEDS:   Not appropriate  for education at this time  Skin:  Skin Assessment: Skin Integrity Issues: Stage III: bilateral buttocks (previously unstageable per WOC note) Other: skin tears bilateral knees  Last BM:  12/20/22  Height:   Ht Readings from Last 1 Encounters:  12/15/22 6\' 2"  (1.88 m)    Weight:   Wt Readings from Last 1 Encounters:  12/22/22 83.2 kg    BMI:  Body mass index is 23.55 kg/m.  Estimated Nutritional Needs:   Kcal:  2400-2600  Protein:  125-145 grams  Fluid:  >2.2 L    Mertie Clause, MS, RD,  LDN Inpatient Clinical Dietitian Please see AMiON for contact information.

## 2022-12-23 ENCOUNTER — Other Ambulatory Visit (HOSPITAL_COMMUNITY): Payer: Medicare Other

## 2022-12-23 ENCOUNTER — Inpatient Hospital Stay (HOSPITAL_COMMUNITY): Payer: Medicare Other

## 2022-12-23 ENCOUNTER — Inpatient Hospital Stay
Admit: 2022-12-23 | Discharge: 2023-01-15 | Disposition: A | Discharge status: Skilled Nursing Facility | Payer: Medicare Other

## 2022-12-23 DIAGNOSIS — N179 Acute kidney failure, unspecified: Secondary | ICD-10-CM | POA: Diagnosis not present

## 2022-12-23 DIAGNOSIS — A4102 Sepsis due to Methicillin resistant Staphylococcus aureus: Secondary | ICD-10-CM | POA: Diagnosis not present

## 2022-12-23 DIAGNOSIS — R6521 Severe sepsis with septic shock: Secondary | ICD-10-CM | POA: Diagnosis not present

## 2022-12-23 DIAGNOSIS — B9562 Methicillin resistant Staphylococcus aureus infection as the cause of diseases classified elsewhere: Secondary | ICD-10-CM | POA: Diagnosis not present

## 2022-12-23 DIAGNOSIS — A419 Sepsis, unspecified organism: Secondary | ICD-10-CM | POA: Diagnosis not present

## 2022-12-23 LAB — GLUCOSE, CAPILLARY
Glucose-Capillary: 181 mg/dL — ABNORMAL HIGH (ref 70–99)
Glucose-Capillary: 194 mg/dL — ABNORMAL HIGH (ref 70–99)
Glucose-Capillary: 128 mg/dL — ABNORMAL HIGH (ref 70–99)
Glucose-Capillary: 118 mg/dL — ABNORMAL HIGH (ref 70–99)

## 2022-12-23 LAB — CULTURE, BLOOD (ROUTINE X 2): Special Requests: ADEQUATE

## 2022-12-23 LAB — CK: Total CK: 25 U/L — ABNORMAL LOW (ref 49–397)

## 2022-12-23 MED ORDER — JUVEN PO PACK: 1.0000 | PACK | Freq: Two times a day (BID) | ORAL | 0 refills | Status: DC

## 2022-12-23 MED ORDER — FUROSEMIDE 40 MG PO TABS: 40.0000 mg | ORAL_TABLET | Freq: Every day | ORAL | Status: DC

## 2022-12-23 MED ORDER — ADULT MULTIVITAMIN W/MINERALS CH: 1.0000 | ORAL_TABLET | Freq: Every day | ORAL | Status: DC

## 2022-12-23 MED ORDER — OLANZAPINE 20 MG PO TABS: 20.0000 mg | ORAL_TABLET | Freq: Every day | ORAL | Status: AC

## 2022-12-23 MED ORDER — PROSOURCE TF20 ENFIT COMPATIBL EN LIQD: 60.0000 mL | Freq: Every day | ENTERAL | Status: DC

## 2022-12-23 MED ORDER — SODIUM CHLORIDE 0.9 % IV SOLN: 8.0000 mg/kg | Freq: Every day | INTRAVENOUS | Status: DC

## 2022-12-23 MED ORDER — OSMOLITE 1.5 CAL PO LIQD: 1000.0000 mL | ORAL | 0 refills | Status: DC

## 2022-12-23 MED ORDER — BUSPIRONE HCL 15 MG PO TABS: 15.0000 mg | ORAL_TABLET | Freq: Three times a day (TID) | ORAL | Status: DC

## 2022-12-23 MED ORDER — ACETAMINOPHEN 325 MG PO TABS: 650.0000 mg | ORAL_TABLET | Freq: Four times a day (QID) | ORAL | Status: AC | PRN

## 2022-12-23 MED ORDER — CHLORHEXIDINE GLUCONATE CLOTH 2 % EX PADS: 6.0000 | MEDICATED_PAD | Freq: Every day | CUTANEOUS | Status: DC

## 2022-12-23 MED ORDER — FOLIC ACID 1 MG PO TABS: 1.0000 mg | ORAL_TABLET | Freq: Every day | ORAL | Status: DC

## 2022-12-23 MED ORDER — MIDODRINE HCL 10 MG PO TABS: 20.0000 mg | ORAL_TABLET | Freq: Three times a day (TID) | ORAL | Status: DC

## 2022-12-23 MED ORDER — INSULIN ASPART 100 UNIT/ML IJ SOLN: 0.0000 [IU] | INTRAMUSCULAR | 11 refills | Status: DC

## 2022-12-23 MED ORDER — FUROSEMIDE 40 MG PO TABS
40.0000 mg | ORAL_TABLET | Freq: Every day | ORAL | Status: DC
  Administered 2022-12-23: 40 mg via ORAL
  Filled 2022-12-23: qty 1

## 2022-12-23 MED ORDER — EZETIMIBE 10 MG PO TABS: 10.0000 mg | ORAL_TABLET | Freq: Every day | ORAL | Status: AC

## 2022-12-23 MED ORDER — POLYETHYLENE GLYCOL 3350 17 G PO PACK: 17.0000 g | PACK | Freq: Every day | ORAL | 0 refills | Status: DC | PRN

## 2022-12-23 MED ORDER — VITAMIN B-1 100 MG PO TABS: 100.0000 mg | ORAL_TABLET | Freq: Every day | ORAL | Status: DC

## 2022-12-23 MED ORDER — ALBUTEROL SULFATE (2.5 MG/3ML) 0.083% IN NEBU: 2.5000 mg | INHALATION_SOLUTION | RESPIRATORY_TRACT | 12 refills | Status: DC | PRN

## 2022-12-23 MED ORDER — ROSUVASTATIN CALCIUM 40 MG PO TABS: 40.0000 mg | ORAL_TABLET | Freq: Every day | ORAL | Status: DC

## 2022-12-23 MED ORDER — ENSURE ENLIVE PO LIQD: 237.0000 mL | Freq: Three times a day (TID) | ORAL | 12 refills | Status: DC

## 2022-12-23 MED ORDER — MELATONIN 3 MG PO TABS: 3.0000 mg | ORAL_TABLET | Freq: Every day | ORAL | 0 refills | Status: AC

## 2022-12-23 MED ORDER — HEPARIN SODIUM (PORCINE) 5000 UNIT/ML IJ SOLN: 5000.0000 [IU] | Freq: Three times a day (TID) | INTRAMUSCULAR | Status: DC

## 2022-12-23 MED ORDER — ACETAMINOPHEN 650 MG RE SUPP: 650.0000 mg | Freq: Four times a day (QID) | RECTAL | 0 refills | Status: DC | PRN

## 2022-12-23 MED ORDER — DOCUSATE SODIUM 50 MG/5ML PO LIQD: 100.0000 mg | Freq: Two times a day (BID) | ORAL | 0 refills | Status: DC | PRN

## 2022-12-23 MED ORDER — SODIUM CHLORIDE 0.9 % IV SOLN
8.0000 mg/kg | Freq: Every day | INTRAVENOUS | Status: DC
  Filled 2022-12-23: qty 13

## 2022-12-23 NOTE — Progress Notes (Signed)
Pt discharged to [facility] with all questions answered. Report given to Chipper Herb, RN. All belongings sent with patient.

## 2022-12-23 NOTE — Progress Notes (Signed)
NAME:  [name], MRN:  [id number], DOB:  05/24/1951, LOS: 8 ADMISSION DATE:  12/15/2022, CONSULTATION DATE:  12/15/2022 REFERRING MD:  Dr. Jerral Ralph, CHIEF COMPLAINT:  Sepsis   History of Present Illness:  [name] is a 72 year old male with past medical history of TBI with chronic dysarthria, schizophrenia, and recent hospitalization for sepsis complicated by hypercarbia who presented to the ED from a skilled nursing facility for evaluation of altered mental status. Concern at the nursing home was for generalized weakness and worsening confusion. Attempted to call nursing facility however did not get an answer.   In the ED, he was febrile to 104, tachycardic to 134, hypotensive, and was found to have purulent urine in his foley catheter. He was given 3L of IV fluids, as well as started on empiric antibiotics. Due to persistently low MAPs after 3L, PCCM was consulted for evaluation for pressor support.   Pertinent  Medical History  HTN Schizophrenia  Anxiety/Depression TBI - chronic dysarthria  Recent hospitalization from 4/9-4/25 for sepsis complicated by hypercarbia  Significant Hospital Events: Including procedures, antibiotic start and stop dates in addition to other pertinent events   5/14 - Started on Cefepime and Vancomycin  5/14 - Started on Low Dose Levo  5/15- bcx2 growing MRSA; on 20 levo and 0.03 vaso; picc line placed Echo w/ lvef 55-60%; severely enlarged right ventricle; no evidence of endocardidtis 5/17 levo down to 10. Failed swallow. Cortrak in place. PICC line removed 5/20 TEE negative for endocarditis 5/22 off pressors, awake alert following commands  Interim History / Subjective:   Overall improved.   Objective   Blood pressure (!) 108/52, pulse 85, temperature 97.7 F (36.5 C), temperature source Bladder, resp. rate 13, height 6\' 2"  (1.88 m), weight 83.2 kg, SpO2 94 %.        Intake/Output Summary (Last 24 hours) at 12/23/2022 0858 Last data  filed at 12/23/2022 0700 Gross per 24 hour  Intake 4399.68 ml  Output 1750 ml  Net 2649.68 ml   Filed Weights   12/21/22 0500 12/22/22 0500 12/23/22 0450  Weight: 84.5 kg 83.2 kg 83.2 kg    Examination:  Gen:   Chronically ill-appearing elderly gentleman resting comfortably in bed HEENT: Poor dentition, NCAT Lungs: Clear to auscultation bilaterally no crackles no wheeze CV:         Regular rate rhythm, S1-S2 Abd:      Soft, nontender nondistended Ext: No significant edema Skin:      Trace lower extremity edema Neuro:   Dysarthria, history of TBI, chronic  Labs/imaging reviewed CBG 183,   Resolved Hospital Problem list   Hypokalemia AKI Sepsis related thrombocytopenia  Assessment & Plan:   Septic shock present on admission Complicated UTI from chronic indwelling foley: foley exchanged on presentation.  MRSA bacteremia recent PICC line P: Stress dose steroids stopped, off of vasopressors Continue midodrine Continue vancomycin dosing per pharmacy and ID recommendations TEE planned today. Stable for transfer from the intensive care unit pickup by [facility] tomorrow.  Acute metabolic encephalopathy History of TBI CT head without any acute abnormality P: Improved  Hyperglycemia -A1c 5/16 5.9 P: SSI with CBGs  Chronic Indwelling Foley Cath  Failed voiding trial with prior hospitalization - Foley Catheter inserted at that time for outpatient urology evaluation  P: Foley has been exchanged this admission Has failed voiding trial in the past.  Chronic HFpEF  Last echo showed grade 1 diastolic dysfunction, with an EF of 60-65%. Does not  seem volume overloaded on exam.; repeat echo 5/15 lvef 55-60%; severely dilated RV P: Continue observe daily weights, follow strict I's and O's Diuresis to maintain euvolemia There is discrepancy in the TTE versus TEE read for RV dysfunction. I will have cardiology review images. Start Lasix 40 mg daily  Sacral Ulcer present on  admission Wound care as necessary. Was septic from this during previous hospitalization. Currently non-bleeding, no discharge present, appears to be healing well P: -WOC consult signed off  History of EtOH use  Ethanol wnl P: Continue home thiamine multivitamin folic acid  Schizophrenia/PTSD  P: Continue home Zyprexa and BuSpar  Anemia, acute on chronic -likely sepsis related, chronic disease, also had hematuria, but this has resolved P: -trend cbc, no signs of bleeding  Mobility: PT OT Up in chair today.  Best Practice (right click and "Reselect all SmartList Selections" daily)   Diet/type: tubefeeds; will reach out to SLP to see if diet can be advanced  DVT prophylaxis: prophylactic heparin  GI prophylaxis: N/A Lines: N/A Foley:  Yes, and it is still needed Code Status:  full code Last date of multidisciplinary goals of care discussion: 5/19 attempted to contact friend [name] over phone but no answer. I asked him about if he had a healthcare POA- he says they have tried to get me to do this before but I'm worried that's because they want to withhold treatment on me. He says "[name] would not do me wrong" in the event he was unable to make his own medical decisions. He has no living family.   Josephine Igo, DO [facility] 12/23/2022 9:03 AM

## 2022-12-23 NOTE — TOC Transition Note (Signed)
Transition of Care [facility]) - CM/SW Discharge Note   Patient Details  Name: [name] MRN: [id number] Date of Birth: [birth year]  Transition of Care [facility]) CM/SW Contact:  Ralene Bathe, LCSWA Phone Number: 12/23/2022, 12:28 PM   Clinical Narrative:    [facility] Anticipated DC date: 12/23/2022 Family notified: Patient alert and oriented Transport by: Hospital staff   Per MD patient ready for DC to [facility] . RN to call report prior to discharge [phone] room number 5E04 receiving MD Luna Kitchens). RN, patient, and facility notified of DC. Floor RN will transport patient.   CSW will sign off for now as social work intervention is no longer needed. Please consult [place] again if new needs arise.    Final next level of care: Long Term Acute Care (LTAC) Barriers to Discharge: Barriers Resolved   Patient Goals and CMS Choice CMS Medicare.gov Compare Post Acute Care list provided to:: Patient Represenative (must comment) Choice offered to / list presented to : Patient  Discharge Placement                Patient chooses bed at:  [facility]) Patient to be transferred to facility by: hospital staff Name of family member notified: patient alert and oriented Patient and family notified of of transfer: 12/23/22  Discharge Plan and Services Additional resources added to the After Visit Summary for   In-house Referral: Clinical Social Work   Post Acute Care Choice: Skilled Nursing Facility                               Social Determinants of Health (SDOH) Interventions SDOH Screenings   Food Insecurity: Food Insecurity Present (11/11/2022)  Housing: High Risk (11/11/2022)  Transportation Needs: Unmet Transportation Needs (11/11/2022)  Utilities: At Risk (11/11/2022)  Alcohol Screen: Low Risk  (03/20/2022)  Depression (PHQ2-9): Low Risk  (08/27/2022)  Recent Concern: Depression (PHQ2-9) - Medium Risk (06/01/2022)  Financial Resource  Strain: Medium Risk (03/18/2022)  Physical Activity: Sufficiently Active (03/20/2022)  Social Connections: Socially Isolated (03/20/2022)  Stress: No Stress Concern Present (03/20/2022)  Tobacco Use: High Risk (12/22/2022)     Readmission Risk Interventions     No data to display

## 2022-12-23 NOTE — Progress Notes (Signed)
Speech Language Pathology Treatment: Dysphagia  Patient Details Name: TARIF MCMANAWAY MRN: [id number] DOB: 1951/01/29 Today's Date: 12/23/2022 Time: 1005-1020 SLP Time Calculation (min) (ACUTE ONLY): 15 min  Assessment / Plan / Recommendation Clinical Impression  Patient seen by SLP for skilled treatment focused on dysphagia goals. Per RN, he drank thickened liquids this morning but did not want to eat any of the dys 2 (minced) solids. Patient was awake and alert when SLP entered room. He told SLP that he has not had an appetite for solid foods since he got here to hospital this admission. SLP inspected oral mucosa which was moist, clean and free of secretions. He was receptive to having some nectar thick juice and dys 3(mechanical soft) solid (Fig [place]). He was able to feed self using right hand but not able to hold both items at once. Mastication of soft solid was mildly delayed but efficient overall and with full oral clearance of PO's. No overt s/s aspiration with sips of nectar thick juice, even when patient taking consecutive sips. SLP recommending continue with Dys 2 solids, nectar thick liquids and will follow for readiness to advance. Anticipate will be able to advance to mechanical soft solids this week. SLP will determine ability to advance to thin liquids at bedside versus needing an objective swallow study (MBS; most recent MBS was 12/16/22).   HPI HPI: Patient is a 72 y.o. male with PMH: anxiety, TBI, manic depression, schizo affective schizophrenia, dysphagia, chronic dysarthria. He was admitted last month and at time of discharge, recommended diet from SLP (following MBSx2) was Regular solids, honey thick liquids. Currently, patient presented to the hospital on 12/15/22 from SNF for evaluation of AMS. Re-assessed via MBS with full liq recommendation on 5/15. Cortrak was placed due to poor PO intake. On 12/21/22 he was made NPO for TEE and remained NPO awaiting SLP .      SLP Plan   Continue with current plan of care      Recommendations for follow up therapy are one component of a multi-disciplinary discharge planning process, led by the attending physician.  Recommendations may be updated based on patient status, additional functional criteria and insurance authorization.    Recommendations  Diet recommendations: Dysphagia 2 (fine chop);Nectar-thick liquid Liquids provided via: Cup;No straw Medication Administration: Crushed with puree Supervision: Staff to assist with self feeding;Full supervision/cueing for compensatory strategies;Patient able to self feed Compensations: Slow rate;Small sips/bites;Minimize environmental distractions Postural Changes and/or Swallow Maneuvers: Seated upright 90 degrees;Upright 30-60 min after meal                  Oral care BID;Staff/trained caregiver to provide oral care   Frequent or constant Supervision/Assistance Dysphagia, unspecified (R13.10)     Continue with current plan of care     Angela Nevin, MA, CCC-SLP Speech Therapy

## 2022-12-23 NOTE — Progress Notes (Signed)
Physical Therapy Treatment Patient Details Name: [name] MRN: [id number] DOB: 10/22/1950 Today's Date: 12/23/2022   History of Present Illness [name] is a 72 y.o. male who presents from SNF for AMS. Found to have severe sepsis due to complicated UTI, acute metabolic encephalopathy and AKI.  PMH includes alcohol abuse, TBI-chronic dysarthria, HTN, HFpEF, schizophrenia, anxiety/depression, homeless, left shoulder fracture with malunion, several recent admissions    PT Comments    Pt admitted with above diagnosis. Pt limited in treatment as noted that pt had BM on arrival and spent time cleaning pt with pt rolling with less assist than at last visit. Pt left in chair position in much better position than on arrival. Pt to d/c to [facility] today per nurse.  Pt currently with functional limitations due to balance and endurance deficits. Pt will benefit from acute skilled PT to increase their independence and safety with mobility to allow discharge.      Recommendations for follow up therapy are one component of a multi-disciplinary discharge planning process, led by the attending physician.  Recommendations may be updated based on patient status, additional functional criteria and insurance authorization.  Follow Up Recommendations  Can patient physically be transported by private vehicle: No    Assistance Recommended at Discharge Frequent or constant Supervision/Assistance  Patient can return home with the following Two people to help with walking and/or transfers;Two people to help with bathing/dressing/bathroom;Assistance with cooking/housework;Assistance with feeding;Direct supervision/assist for medications management;Direct supervision/assist for financial management;Assist for transportation;Help with stairs or ramp for entrance   Equipment Recommendations  None recommended by PT    Recommendations for Other Services       Precautions / Restrictions  Precautions Precautions: Fall Precaution Comments: left buttock wound, incontinent BMs, catheter, watch BP Restrictions Weight Bearing Restrictions: No     Mobility  Bed Mobility Overal bed mobility: Needs Assistance Bed Mobility: Supine to Sit, Sit to Supine Rolling: +2 for physical assistance, Mod assist, Max assist         General bed mobility comments: max/total A for adjusting in bed due to L shoulder pain.  Pt able to roll to left with mod assist and to right with max asssist with use of rail to be cleaned of BM.    Transfers                        Ambulation/Gait                   Stairs             Wheelchair Mobility    Modified Rankin (Stroke Patients Only)       Balance                                            Cognition Arousal/Alertness: Awake/alert Behavior During Therapy: Flat affect Overall Cognitive Status: No family/caregiver present to determine baseline cognitive functioning Area of Impairment: Problem solving                   Current Attention Level: Sustained   Following Commands: Follows one step commands with increased time Safety/Judgement: Decreased awareness of safety, Decreased awareness of deficits   Problem Solving: Requires verbal cues General Comments: pt following most simple commands this date and declining OOB or EOB due to active BM.  Exercises General Exercises - Lower Extremity Ankle Circles/Pumps: AROM, Both, 20 reps, Supine Heel Slides: AROM, Both, 5 reps, Supine    General Comments General comments (skin integrity, edema, etc.): VSS      Pertinent Vitals/Pain Pain Assessment Pain Assessment: No/denies pain    Home Living                          Prior Function            PT Goals (current goals can now be found in the care plan section) Acute Rehab PT Goals Patient Stated Goal: none stated Progress towards PT goals: Progressing  toward goals    Frequency    Min 3X/week      PT Plan Discharge plan needs to be updated;Frequency needs to be updated    Co-evaluation              AM-PAC PT "6 Clicks" Mobility   Outcome Measure  Help needed turning from your back to your side while in a flat bed without using bedrails?: A Lot Help needed moving from lying on your back to sitting on the side of a flat bed without using bedrails?: Total Help needed moving to and from a bed to a chair (including a wheelchair)?: Total Help needed standing up from a chair using your arms (e.g., wheelchair or bedside chair)?: Total Help needed to walk in hospital room?: Total Help needed climbing 3-5 steps with a railing? : Total 6 Click Score: 7    End of Session Equipment Utilized During Treatment: Gait belt;Oxygen Activity Tolerance: Patient limited by fatigue Patient left: in bed;with call bell/phone within reach;with bed alarm set;with SCD's reapplied Nurse Communication: Mobility status PT Visit Diagnosis: Other abnormalities of gait and mobility (R26.89);Muscle weakness (generalized) (M62.81)     Time: 9147-8295 PT Time Calculation (min) (ACUTE ONLY): 21 min  Charges:  $Therapeutic Activity: 8-22 mins                     [facility] M,PT Acute Rehab Services [phone]    Bevelyn Buckles 12/23/2022, 1:24 PM

## 2022-12-23 NOTE — Progress Notes (Signed)
Cortrak Tube Team Note:  Consult received to advance Cortrak feeding tube after partially being removed after procedure earlier in the week. At current position, tube terminates below the GE junction per XR 5/20. Re-advanced tube to 69cm for more optimal placement in the stomach. XR ordered to confirm advancement but tube is ok to use prior to new imaging.    If the tube becomes dislodged please keep the tube and contact the Cortrak team at [email or web address] for replacement.  If after hours and replacement cannot be delayed, place a NG tube and confirm placement with an abdominal x-ray.    Greig Castilla, RD, LDN Clinical Dietitian RD pager # available in AMION  After hours/weekend pager # available in [facility]

## 2022-12-23 NOTE — Progress Notes (Signed)
[facility]  Date of Admission:  12/15/2022     Principal Problem:   Sepsis (HCC) Active Problems:   Schizophrenia (HCC)   Mixed hyperlipidemia   Essential hypertension   Alcohol dependence in remission (HCC)   PTSD (post-traumatic stress disorder)   AKI (acute kidney injury) (HCC)   Encephalopathy acute   Septic shock (HCC)   Hyperglycemia   Malnutrition of moderate degree   Bacteremia          Assessment: 72 year old male with history of dementia, schizophrenia, recent admission for a sepsis due to sacral decubitus ulcers treated with 7 days antibiotics (4/9-4/25/24), orif screws in sacral area left SI joint.  Course complicated by acute hypoxic hypercapnic respiratory failure after getting IV Ativan requiring ICU transfer and ultimately readmitted from [facility] 12/15/22 for altered mental status found to have:   #MRSA bacteremia #Sacral wounds #Previous ORIF screws into sacrum across the left SI joint #AMS acute metabolic encephalopathy resolved - Presented with temp of 104, WBC 18 K.  CT abdomen pelvis showed no acute abnormalities.  Has history noted purulent drainage from Foley catheter, decompressed on imaging.  He was admitted for sepsis secondary to UTI.  ID engaged as cultures grew MRSA.  Foley catheter was placed at last admission as he failed voiding trial. - PICC placed on 5/14 but pulled on 5/17 - Blood cultures on 5/14 grew MRSA, Urine CX+ MRSA(which is spill over form blood, as urine is not source of bacteremia -- wound doesn't appear infected but source of translocation likely - TTE on 5/15 technically difficult-> no vegetation. CT Maxilla-facial showed no abscess.  - 5/15 repeat bcx grow 1 of 2 set reported on 5/19 - 5/20 tee no vegetation - 5/21 abd pelv ct repeat no abscess  - although orif hardware in sacrum, doesn't mean it'll be infected and we can observe off abx once done with treatment to see if that does become  infected  Recommendations: - can transition to daptomycin to finish 4 week course on 01/18/23 - id f/u as below to see how he does off abx and see if orif becomes infected     #Remote history of left shoulder fracture after mechanical fall in August 2021      OPAT Orders Discharge antibiotics to be given via PICC line Discharge antibiotics: Daptomycin until 01/18/23      [facility] Care Per Protocol:  Home health RN for IV administration and teaching; PICC line care and labs.    Labs weekly while on IV antibiotics: _x_ CBC with differential __ BMP _x_ CMP _x_ CRP __ ESR __ Vancomycin trough _x_ CK  _x_ Please pull PIC at completion of IV antibiotics _x_ Please leave PIC in place until doctor has seen patient or been notified  Fax weekly labs to [phone]  Clinic Follow Up Appt: 6/12 @ 4pm  @  [facility] [address], [place], [place] [address] Phone: [phone]  Microbiology:   Antibiotics: Vancomycin and cefepime 5/14-current   Cultures: Blood 5/14 2/2 MRSA 5/15 1/2 mrsa 5/20 ngtd   SUBJECTIVE: Pending dispo Off pressors Doing well No complaint  Review of Systems: Review of Systems  All other systems reviewed and are negative.    Scheduled Meds:  busPIRone  15 mg Per Tube TID   Chlorhexidine Gluconate Cloth  6 each Topical Daily   ezetimibe  10 mg Per Tube Daily   feeding supplement  237 mL  Oral TID BM   feeding supplement (OSMOLITE 1.5 CAL)  1,000 mL Per Tube Q24H   feeding supplement (PROSource TF20)  60 mL Per Tube Daily   folic acid  1 mg Per Tube Daily   furosemide  40 mg Oral Daily   heparin injection (subcutaneous)  5,000 Units Subcutaneous Q8H   insulin aspart  0-15 Units Subcutaneous Q4H   melatonin  3 mg Per Tube QHS   midodrine  20 mg Per Tube Q8H   multivitamin with minerals  1 tablet Per Tube Daily   nutrition supplement (JUVEN)  1 packet Per Tube BID BM   OLANZapine  20 mg Per Tube QHS   rosuvastatin  40 mg  Per Tube Daily   sodium chloride flush  10-40 mL Intracatheter Q12H   thiamine  100 mg Per Tube Daily   Continuous Infusions:  sodium chloride     sodium chloride Stopped (12/21/22 2352)   vancomycin 750 mg (12/23/22 0901)   PRN Meds:.Place/Maintain arterial line **AND** sodium chloride, acetaminophen **OR** acetaminophen, albuterol, docusate, glycopyrrolate, ondansetron **OR** ondansetron (ZOFRAN) IV, polyethylene glycol, sodium chloride flush Allergies  Allergen Reactions   Penicillins Other (See Comments)    Unsure of reaction, had allergic reaction as a child and has not taken since then    OBJECTIVE: Vitals:   12/23/22 0450 12/23/22 0500 12/23/22 0602 12/23/22 0700  BP:  (!) 100/57 104/64 (!) 108/52  Pulse:  98 98 85  Resp:  15 17 13   Temp:   97.7 F (36.5 C)   TempSrc:   Bladder   SpO2:  97% 95% 94%  Weight: 83.2 kg     Height:       Body mass index is 23.55 kg/m.  Physical exam: General/constitutional: no distress, pleasant, mumbling voice HEENT: Normocephalic, PER, Conj Clear, EOMI; feeding tube in place CV: rrr no mrg Lungs: clear to auscultation, normal respiratory effort Abd: Soft, Nontender Ext: no edema Skin: no rash Neuro: nonfocal -- generalized weakness MSK: no peripheral joint swelling/tenderness/warmth; back spines nontender     Lab Results Lab Results  Component Value Date   WBC 17.4 (H) 12/21/2022   HGB 11.2 (L) 12/21/2022   HCT 34.7 (L) 12/21/2022   MCV 87.8 12/21/2022   PLT 263 12/21/2022    Lab Results  Component Value Date   CREATININE 0.74 12/21/2022   BUN 28 (H) 12/21/2022   NA 135 12/21/2022   K 3.8 12/21/2022   CL 98 12/21/2022   CO2 28 12/21/2022    Lab Results  Component Value Date   ALT 40 12/16/2022   AST 78 (H) 12/16/2022   ALKPHOS 58 12/16/2022   BILITOT 1.3 (H) 12/16/2022      Imaging: Reviewed   5/20 tee  1. No vegetations.   2. Left ventricular ejection fraction, by estimation, is 60 to 65%. The left  ventricle has normal function. The left ventricle has no regional wall motion abnormalities.   3. Right ventricular systolic function is normal. The right ventricular size is normal.   4. No left atrial/left atrial appendage thrombus was detected.   5. The mitral valve is normal in structure. Trivial mitral valve  regurgitation. No evidence of mitral stenosis.   6. The aortic valve is normal in structure. Aortic valve regurgitation is trivial. No aortic stenosis is present.   7. There is Moderate (Grade III) plaque involving the descending aorta.     5/21 repeat abd pelv ct with contrast 1. Trace left basilar pleural fluid  and mild bibasilar atelectasis. 2. Interval contraction of the gallbladder with potential tiny gallstones. No evidence of cholecystitis. 3. Mild vague edema in the central mesenteric fat, more prominent compared to the prior study. This is nonspecific and is not associated with ascites or abscess. 4. Bladder decompressed by Foley catheter. Somewhat prominent appearance of the bladder wall despite decompression. This is nonspecific but may implicate chronic outlet obstruction or inflammation/cystitis. 5. Stable moderate prostate enlargement. 6. Stable degenerative disc disease at L3-4 and L5-S1. 7. 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 8. Aortic atherosclerosis.  Raymondo Band, MD [facility] 12/23/2022, 10:49 AM    I spent more than 50 minute reviewing data/chart, and coordinating care, providing direct face to face time providing counseling/discussing diagnostics/treatment plan with patient and treatment team
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
## 2022-12-23 NOTE — Discharge Summary (Signed)
Physician Discharge Summary         Patient ID: [name] MRN: [id number] DOB/AGE: Dec 14, 1950 72 y.o.  Admit date: 12/15/2022 Discharge date: 12/23/2022  Discharge Diagnoses:    Active Hospital Problems   Diagnosis Date Noted   Sepsis (HCC) 11/10/2022   Bacteremia 12/21/2022   Hyperglycemia 12/17/2022   Malnutrition of moderate degree 12/17/2022   Encephalopathy acute 12/16/2022   Septic shock (HCC) 12/16/2022   AKI (acute kidney injury) (HCC) 12/15/2022   PTSD (post-traumatic stress disorder) 08/27/2022   Alcohol dependence in remission (HCC) 05/18/2022   Essential hypertension 07/05/2021   Mixed hyperlipidemia 03/19/2021   Schizophrenia (HCC) 08/30/2013    Resolved Hospital Problems  No resolved problems to display.     Discharge summary    [name] is a 72 year old male with past medical history of TBI with chronic dysarthria, schizophrenia, and recent hospitalization 4/9 to 4/25 for sepsis due to sacral decubitus ulcer complicated by hypercarbia after IV ativan discharged to SNF with foley catheter after failed voiding trials. Presented back from SNF, [place], on 5/14 for evaluation of altered mental status and weakness. Concern at the nursing home was for generalized weakness and worsening confusion.    In the ED, he was febrile to 104, tachycardic to 134, hypotensive, and was found to have purulent urine in his foley catheter. He was given 3L of IV fluids, as well as started on empiric antibiotics. Due to persistently low MAPs after 3L, PCCM was consulted for evaluation for pressor support. Patient was transferred to the ICU and continued on vasopressor support.  Infectious disease consulted. Patient had slow but improved progression in mental status and renal function with weaning of vasopressors.  BC positive for MRSA same day PICC was placed, as well as urine culture but felt that was spill over from blood.  PICC line was discontinued. TTE on 5/15  was technically difficult but no vegetation noted.  Repeat blood cultures sent on 5/15 and 5/20.  Failed swallow eval 5/17, SLP following, and cortrak was placed and started on midodrine given ongoing norepinephrine requirement. Underwent TEE on 5/20 which was negative for vegetations.  Able to wean off norepinephrine by 5/21, remains on midodrine. Went for CT of abd/ pelvis on 5/21.  Started on dysphagia 2 diet per SLP but still requiring cortrak for full nutrition. Continues to have slow but gradual improvement.  Patient to be discharged to [facility] for continued care, nutrition, and prolonged IV antibiotics.   Discharge Plan by Active Problems    Septic shock present on admission Complicated UTI from chronic indwelling foley: foley exchanged on presentation.  MRSA bacteremia recent PICC line P: - off vasopressors 5/20, stress dose steroids stopped - continue midodrine 20mg  TID - per ID recs, to switch vancomycin to daptomycin 650mg  daily> to start 5/22 at 2000 and end on 6/17.  Will need outpatient followup with ID for further abx guidance.    Acute metabolic encephalopathy History of TBI CT head without any acute abnormality P: - continued supportive care   Hyperglycemia -A1c 5/16 5.9 P: - SSI    Chronic Indwelling Foley Cath  Failed voiding trial with prior hospitalization - Foley Catheter inserted at that time for outpatient urology evaluation  P: - Foley has been exchanged this admission - Has failed voiding trial in the past.  Will need urology followup outpt - resume PO finasteride and tamsulosin when able to take POs   Chronic HFpEF  Last  echo showed grade 1 diastolic dysfunction, with an EF of 60-65%. Does not seem volume overloaded on exam.; repeat echo 5/15 lvef 55-60%; severely dilated RV P: - Continue observe daily weights, follow strict I's and O's - Diuresis to maintain euvolemia.  Lasix 40mg  today - There is discrepancy in the TTE versus TEE  read for RV dysfunction.  Pending cardiology review.    Sacral Ulcer present on admission Wound care as necessary. Was septic from this during previous hospitalization. Currently non-bleeding, no discharge present, appears to be healing well P: - continued wound care per [facility] recs    History of EtOH use  Ethanol wnl P: - Continue home thiamine, multivitamin, and folic acid   Schizophrenia/PTSD  P: - Continue home Zyprexa and BuSpar   Anemia, acute on chronic -likely sepsis related, chronic disease, also had hematuria, but this has resolved P: -trend cbc, no signs of bleeding   Mobility: - cont PT / OT.   Up in chair today.  Dysphagia - cont cortrak till PO nutrition sufficient.  On nocturnal osmolite 1.5 cal at 74ml/ hr from 6PMto 6AM - SLP following> dysphagia 2 diet - aspiration precautions   Significant Hospital tests/ studies  5/14 CT a/p and [facility] 5/16 CT maxillofacial 5/16 CTH>  stable 5/15 TTE 5/20 TEE>   1. No vegetations.   2. Left ventricular ejection fraction, by estimation, is 60 to 65%. The left ventricle has normal function. The left ventricle has no regional wall motion abnormalities.   3. Right ventricular systolic function is normal. The right ventricular size is normal.   4. No left atrial/left atrial appendage thrombus was detected.   5. The mitral valve is normal in structure. Trivial mitral valve  regurgitation. No evidence of mitral stenosis.   6. The aortic valve is normal in structure. Aortic valve regurgitation is trivial. No aortic stenosis is present.   7. There is Moderate (Grade III) plaque involving the descending aorta.   5/21 CT a/p>  1. Trace left basilar pleural fluid and mild bibasilar atelectasis. 2. Interval contraction of the gallbladder with potential tiny gallstones. No evidence of cholecystitis. 3. Mild vague edema in the central mesenteric fat, more prominent compared to the prior study. This is nonspecific and is  not associated with ascites or abscess. 4. Bladder decompressed by Foley catheter. Somewhat prominent appearance of the bladder wall despite decompression. This is nonspecific but may implicate chronic outlet obstruction or inflammation/cystitis. 5. Stable moderate prostate enlargement. 6. Stable degenerative disc disease at L3-4 and L5-S1. 7. 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 8. Aortic atherosclerosis. Aortic Atherosclerosis  Procedures   5/15 left nare cortrak 5/14 PICC > 5/17  Culture data/antimicrobials   5/14 MRSA PCR positive  5/14 Bcx2>  MRSA  5/14 UC> MRSA (suspected spill over from [facility]) 5/15 Bcx2> 1/2 MRSA  5/20 Bcx2> ngtd x 2 days    Consults  ID Cardiology SLP    Discharge Exam: BP 107/61   Pulse (!) 105   Temp 98.2 F (36.8 C) (Bladder)   Resp 16   Ht 6\' 2"  (1.88 m)   Wt 83.2 kg   SpO2 96%   BMI 23.55 kg/m   General:  chronically ill appearing elderly male sitting upright in bed in NAD HEENT: MM pink/moist,  pupils 3/r, left nare cortrak, mild dysarthria, poor dentition Neuro: awake, oriented x 3, MAE- generalized weakness CV: rr, NSR, no murmur PULM:  non labored, room air,  clear, diminished in bases GI: soft, bs+, NT, foley Extremities: warm/dry, +1 LE edema  Skin: no rashes   Labs at discharge   Lab Results  Component Value Date   CREATININE 0.74 12/21/2022   BUN 28 (H) 12/21/2022   NA 135 12/21/2022   K 3.8 12/21/2022   CL 98 12/21/2022   CO2 28 12/21/2022   Lab Results  Component Value Date   WBC 17.4 (H) 12/21/2022   HGB 11.2 (L) 12/21/2022   HCT 34.7 (L) 12/21/2022   MCV 87.8 12/21/2022   PLT 263 12/21/2022   Lab Results  Component Value Date   ALT 40 12/16/2022   AST 78 (H) 12/16/2022   ALKPHOS 58 12/16/2022   BILITOT 1.3 (H) 12/16/2022   Lab Results  Component Value Date   INR 1.1 12/15/2022   INR 1.2 11/10/2022   INR 1.02 09/20/2013    Current  radiological studies    DG Abd Portable 1V  Result Date: 12/23/2022 CLINICAL DATA:  [id number] Encounter for feeding tube placement [id number] EXAM: PORTABLE ABDOMEN - 1 VIEW COMPARISON:  None Available. FINDINGS: Feeding tube tip overlies the region of the distal stomach or proximal duodenum. Nonobstructive upper abdominal bowel gas pattern. Left basilar subsegmental atelectasis. IMPRESSION: Feeding tube tip overlies the region of the distal stomach or proximal duodenum. Electronically Signed   By: Caprice Renshaw M.D.   On: 12/23/2022 10:27   CT ABDOMEN PELVIS W CONTRAST  Result Date: 12/22/2022 CLINICAL DATA:  MRSA sepsis and fever. EXAM: CT ABDOMEN AND PELVIS WITH CONTRAST TECHNIQUE: Multidetector CT imaging of the abdomen and pelvis was performed using the standard protocol following bolus administration of intravenous contrast. RADIATION DOSE REDUCTION: This exam was performed according to the departmental dose-optimization program which includes automated exposure control, adjustment of the mA and/or kV according to patient size and/or use of iterative reconstruction technique. CONTRAST:  75mL OMNIPAQUE IOHEXOL 350 MG/ML SOLN COMPARISON:  12/15/2022 FINDINGS: Lower chest: Trace left basilar pleural fluid and mild bibasilar atelectasis. Hepatobiliary: No focal liver abnormality is seen. Interval gallbladder contraction. Potential tiny gallstones without overt gallbladder wall thickening or biliary dilatation. Pancreas: Unremarkable. No pancreatic ductal dilatation or surrounding inflammatory changes. Spleen: Normal in size without focal abnormality. Adrenals/Urinary Tract: Suggestion of mild hydronephrosis has resolved bilaterally. The adrenal glands are normal. No evidence renal masses or calculi. The bladder is decompressed by a Foley catheter. Somewhat prominent appearance of the bladder wall despite decompression. This is nonspecific but may implicate chronic outlet obstruction or inflammation/cystitis.  Stomach/Bowel: Feeding tube extends to the level of the body of the stomach. Bowel shows no evidence of obstruction, ileus, inflammation or lesion. The appendix is normal. No free intraperitoneal air. Vascular/Lymphatic: Mild atherosclerosis of the abdominal aorta without aneurysm or dissection. No enlarged lymph nodes identified in the abdomen or pelvis. Reproductive: Stable moderate prostate enlargement. Other: Mild vague edema in the central mesenteric fat, more prominent compared to the prior study. This is nonspecific and is not associated with ascites or abscess. Musculoskeletal: Stable degenerative disc disease at L3-4 and L5-S1. Stable screw fixation across the left SI joint. Stable old left superior and inferior pubic rami fractures with nonunion of the superior ramus fracture. IMPRESSION: 1. Trace left basilar pleural fluid and mild bibasilar atelectasis. 2. Interval contraction of the gallbladder with potential tiny gallstones. No evidence of cholecystitis. 3. Mild vague edema in the central mesenteric fat, more prominent compared to the prior study. This is nonspecific and is not associated with ascites or abscess.  4. Bladder decompressed by Foley catheter. Somewhat prominent appearance of the bladder wall despite decompression. This is nonspecific but may implicate chronic outlet obstruction or inflammation/cystitis. 5. Stable moderate prostate enlargement. 6. Stable degenerative disc disease at L3-4 and L5-S1. 7. 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 8. Aortic atherosclerosis. Aortic Atherosclerosis (ICD10-I70.0). Electronically Signed   By: Irish Lack M.D.   On: 12/22/2022 15:24   DG Abd Portable 1V  Result Date: 12/21/2022 CLINICAL DATA:  Enteric catheter placement EXAM: PORTABLE ABDOMEN - 1 VIEW COMPARISON:  12/21/2022 FINDINGS: Frontal view of the lower chest and upper abdomen demonstrate enteric catheter tip projecting over  gastric body, not appreciably changed since prior exam. Stable nonspecific bowel gas pattern. IMPRESSION: 1. Enteric catheter tip projecting over the gastric body, unchanged. Electronically Signed   By: Sharlet Salina M.D.   On: 12/21/2022 18:43   DG Abd Portable 1V  Result Date: 12/21/2022 CLINICAL DATA:  Nasogastric tube placement. EXAM: PORTABLE ABDOMEN - 1 VIEW COMPARISON:  Dec 16, 2022 FINDINGS: Enteric catheter projects over the expected location of the gastric body. Nonspecific bowel gas pattern. IMPRESSION: Enteric catheter projects over the expected location of the gastric body. Electronically Signed   By: Ted Mcalpine M.D.   On: 12/21/2022 17:38   ECHO TEE  Result Date: 12/21/2022    TRANSESOPHOGEAL ECHO REPORT   Patient Name:   JONCARLO TUCCIARONE Date of Exam: 12/21/2022 Medical Rec #:  [id number]       Height:       74.0 in Accession #:    [id number]      Weight:       186.3 lb Date of Birth:  [birth year]       BSA:          2.109 m Patient Age:    71 years        BP:           104/53 mmHg Patient Gender: M               HR:           75 bpm. Exam Location:  Inpatient Procedure: Transesophageal Echo, Cardiac Doppler and Color Doppler Indications:    Bacteremia  History:        Patient has prior history of Echocardiogram examinations, most                 recent 12/16/2022. ETOH; Risk Factors:Dyslipidemia, Hypertension                 and Current Smoker.  Sonographer:    Dondra Prader RVT RCS Referring Phys: [facility] PROCEDURE: After discussion of the risks and benefits of a TEE, an informed consent was obtained from the patient. The transesophogeal probe was passed without difficulty through the esophogus of the patient. Sedation performed by different physician. The patient's vital signs; including heart rate, blood pressure, and oxygen saturation; remained stable throughout the procedure. The patient developed no complications during the procedure.  IMPRESSIONS  1. No vegetations.  2. Left  ventricular ejection fraction, by estimation, is 60 to 65%. The left ventricle has normal function. The left ventricle has no regional wall motion abnormalities.  3. Right ventricular systolic function is normal. The right ventricular size is normal.  4. No left atrial/left atrial appendage thrombus was detected.  5. The mitral valve is normal in structure. Trivial mitral valve regurgitation. No evidence of mitral stenosis.  6.  The aortic valve is normal in structure. Aortic valve regurgitation is trivial. No aortic stenosis is present.  7. There is Moderate (Grade III) plaque involving the descending aorta. FINDINGS  Left Ventricle: Left ventricular ejection fraction, by estimation, is 60 to 65%. The left ventricle has normal function. The left ventricle has no regional wall motion abnormalities. The left ventricular internal cavity size was normal in size. There is  no left ventricular hypertrophy. Right Ventricle: The right ventricular size is normal. Right ventricular systolic function is normal. Left Atrium: Left atrial size was normal in size. No left atrial/left atrial appendage thrombus was detected. Right Atrium: Right atrial size was normal in size. Pericardium: There is no evidence of pericardial effusion. Mitral Valve: The mitral valve is normal in structure. Trivial mitral valve regurgitation. No evidence of mitral valve stenosis. Tricuspid Valve: The tricuspid valve is normal in structure. Tricuspid valve regurgitation is trivial. No evidence of tricuspid stenosis. Aortic Valve: The aortic valve is normal in structure. Aortic valve regurgitation is trivial. No aortic stenosis is present. Pulmonic Valve: The pulmonic valve was normal in structure. Pulmonic valve regurgitation is trivial. No evidence of pulmonic stenosis. Aorta: The aortic root is normal in size and structure. There is moderate (Grade III) plaque involving the descending aorta. IAS/Shunts: No atrial level shunt detected by color flow  Doppler. Additional Comments: No vegetations. [name] MD Electronically signed by [name] MD Signature Date/Time: 12/21/2022/2:53:23 PM    Final    EP STUDY  Result Date: 12/21/2022 See surgical note for result.   Disposition:  [facility], [phone], accepted by Dr. Luna Kitchens   Discharge disposition: [facility] Not Defined       Discharge Instructions     Discharge wound care:   Complete by: As directed    Clean buttocks wounds with NS, apply silver hydrofiber [place] [phone]) cut to fit wound beds daily.  Cover with silicone foam. May lift foam dressing daily to replace Silver.  Change foam dressing q3 days and prn soiling.  IF SILVER IS STUCK TO WOUND BED WHEN CHANGING SOAK WITH NS FOR ATRAUMATIC REMOVAL.       Allergies as of 12/23/2022       Reactions   Penicillins Other (See Comments)   Unsure of reaction, had allergic reaction as a child and has not taken since then        Medication List     STOP taking these medications    clotrimazole-betamethasone cream Commonly known as: LOTRISONE   finasteride 5 MG tablet Commonly known as: PROSCAR   hydrOXYzine 50 MG tablet Commonly known as: ATARAX   PROTEIN PO Replaced by: feeding supplement (PROSource TF20) liquid   tamsulosin 0.4 MG Caps capsule Commonly known as: FLOMAX       TAKE these medications    acetaminophen 325 MG tablet Commonly known as: TYLENOL Place 2 tablets (650 mg total) into feeding tube every 6 (six) hours as needed for mild pain (or Fever >/= 101). What changed:  how to take this reasons to take this   acetaminophen 650 MG suppository Commonly known as: TYLENOL Place 1 suppository (650 mg total) rectally every 6 (six) hours as needed for mild pain (or Fever >/= 101). What changed: You were already taking a medication with the same name, and this prescription was added. Make sure you understand how and when to take each.    albuterol (2.5 MG/3ML) 0.083% nebulizer solution Commonly known as: PROVENTIL Take 3 mLs (  2.5 mg total) by nebulization every 2 (two) hours as needed for wheezing.   busPIRone 15 MG tablet Commonly known as: BUSPAR Place 1 tablet (15 mg total) into feeding tube 3 (three) times daily. What changed: how to take this   Chlorhexidine Gluconate Cloth 2 % Pads Apply 6 each topically daily. Start taking on: Dec 24, 2022   DAPTOmycin 650 mg in sodium chloride 0.9 % 50 mL Inject 650 mg into the vein daily at 8 pm.   docusate 50 MG/5ML liquid Commonly known as: COLACE Place 10 mLs (100 mg total) into feeding tube 2 (two) times daily as needed for mild constipation.   ezetimibe 10 MG tablet Commonly known as: ZETIA Place 1 tablet (10 mg total) into feeding tube daily. Start taking on: Dec 24, 2022 What changed: how to take this   feeding supplement (PROSource TF20) liquid Place 60 mLs into feeding tube daily. Start taking on: Dec 24, 2022 Replaces: PROTEIN PO   folic acid 1 MG tablet Commonly known as: FOLVITE Place 1 tablet (1 mg total) into feeding tube daily. Start taking on: Dec 24, 2022   heparin 5000 UNIT/ML injection Inject 1 mL (5,000 Units total) into the skin every 8 (eight) hours.   insulin aspart 100 UNIT/ML injection Commonly known as: novoLOG Inject 0-15 Units into the skin every 4 (four) hours.   melatonin 3 MG Tabs tablet Place 1 tablet (3 mg total) into feeding tube at bedtime.   midodrine 10 MG tablet Commonly known as: PROAMATINE Place 2 tablets (20 mg total) into feeding tube every 8 (eight) hours. What changed:  how much to take how to take this   multivitamin with minerals Tabs tablet Place 1 tablet into feeding tube daily. Start taking on: Dec 24, 2022   nutrition supplement (JUVEN) Pack Place 1 packet into feeding tube 2 (two) times daily between meals.   feeding supplement (OSMOLITE 1.5 CAL) Liqd Place 1,000 mLs into feeding tube daily.    feeding supplement Liqd Take 237 mLs by mouth 3 (three) times daily between meals.   OLANZapine 20 MG tablet Commonly known as: ZYPREXA Place 1 tablet (20 mg total) into feeding tube at bedtime. What changed: how to take this   polyethylene glycol 17 g packet Commonly known as: MIRALAX / GLYCOLAX Place 17 g into feeding tube daily as needed for moderate constipation. What changed:  how to take this when to take this reasons to take this   rosuvastatin 40 MG tablet Commonly known as: CRESTOR Place 1 tablet (40 mg total) into feeding tube daily. Start taking on: Dec 24, 2022 What changed: how to take this   thiamine 100 MG tablet Commonly known as: Vitamin B-1 Place 1 tablet (100 mg total) into feeding tube daily. Start taking on: Dec 24, 2022 What changed: how to take this               Discharge Care Instructions  (From admission, onward)           Start     Ordered   12/23/22 0000  Discharge wound care:       Comments: Clean buttocks wounds with NS, apply silver hydrofiber [place] [phone]) cut to fit wound beds daily.  Cover with silicone foam. May lift foam dressing daily to replace Silver.  Change foam dressing q3 days and prn soiling.  IF SILVER IS STUCK TO WOUND BED WHEN CHANGING SOAK WITH NS FOR ATRAUMATIC REMOVAL.   12/23/22 1320  Follow-up appointment   Per [facility]  Discharge Condition:    stable     Posey Boyer, MSN, AG-ACNP-BC [facility] 12/23/2022, 1:23 PM  See Amion for pager If no response to pager, please call PCCM consult pager After 7:00 pm call Elink

## 2022-12-24 LAB — CBC
HCT: 36.6 % — ABNORMAL LOW (ref 39.0–52.0)
Hemoglobin: 11.7 g/dL — ABNORMAL LOW (ref 13.0–17.0)
MCH: 28.7 pg (ref 26.0–34.0)
MCHC: 32 g/dL (ref 30.0–36.0)
MCV: 89.9 fL (ref 80.0–100.0)
Platelets: 347 10*3/uL (ref 150–400)
RBC: 4.07 MIL/uL — ABNORMAL LOW (ref 4.22–5.81)
RDW: 15.9 % — ABNORMAL HIGH (ref 11.5–15.5)
WBC: 11.6 10*3/uL — ABNORMAL HIGH (ref 4.0–10.5)
nRBC: 0 % (ref 0.0–0.2)

## 2022-12-24 LAB — COMPREHENSIVE METABOLIC PANEL
ALT: 212 U/L — ABNORMAL HIGH (ref 0–44)
AST: 117 U/L — ABNORMAL HIGH (ref 15–41)
Albumin: 2.1 g/dL — ABNORMAL LOW (ref 3.5–5.0)
Alkaline Phosphatase: 92 U/L (ref 38–126)
Anion gap: 7 (ref 5–15)
BUN: 28 mg/dL — ABNORMAL HIGH (ref 8–23)
CO2: 33 mmol/L — ABNORMAL HIGH (ref 22–32)
Calcium: 8.6 mg/dL — ABNORMAL LOW (ref 8.9–10.3)
Chloride: 97 mmol/L — ABNORMAL LOW (ref 98–111)
Creatinine, Ser: 0.85 mg/dL (ref 0.61–1.24)
GFR, Estimated: >60 mL/min (ref >60)
Glucose, Bld: 96 mg/dL (ref 70–99)
Potassium: 3.9 mmol/L (ref 3.5–5.1)
Sodium: 137 mmol/L (ref 135–145)
Total Bilirubin: 1.5 mg/dL — ABNORMAL HIGH (ref 0.3–1.2)
Total Protein: 5.9 g/dL — ABNORMAL LOW (ref 6.5–8.1)

## 2022-12-24 LAB — CULTURE, BLOOD (ROUTINE X 2): Culture: NO GROWTH

## 2022-12-25 LAB — CBC
HCT: 39.3 % (ref 39.0–52.0)
Hemoglobin: 12.8 g/dL — ABNORMAL LOW (ref 13.0–17.0)
MCH: 29.2 pg (ref 26.0–34.0)
MCHC: 32.6 g/dL (ref 30.0–36.0)
MCV: 89.7 fL (ref 80.0–100.0)
Platelets: 325 10*3/uL (ref 150–400)
RBC: 4.38 MIL/uL (ref 4.22–5.81)
RDW: 16.2 % — ABNORMAL HIGH (ref 11.5–15.5)
WBC: 13.6 10*3/uL — ABNORMAL HIGH (ref 4.0–10.5)
nRBC: 0 % (ref 0.0–0.2)

## 2022-12-25 LAB — MISC LABCORP TEST (SEND OUT)
LabCorp test name: 1
Labcorp test code: 96388

## 2022-12-25 LAB — CULTURE, BLOOD (ROUTINE X 2): Culture: NO GROWTH

## 2022-12-25 LAB — CK: Total CK: 18 U/L — ABNORMAL LOW (ref 49–397)

## 2022-12-26 LAB — CBC
HCT: 35.4 % — ABNORMAL LOW (ref 39.0–52.0)
HCT: 39.3 % (ref 39.0–52.0)
HCT: 39 % (ref 39.0–52.0)
Hemoglobin: 11.5 g/dL — ABNORMAL LOW (ref 13.0–17.0)
Hemoglobin: 12.6 g/dL — ABNORMAL LOW (ref 13.0–17.0)
Hemoglobin: 12.8 g/dL — ABNORMAL LOW (ref 13.0–17.0)
MCH: 28.9 pg (ref 26.0–34.0)
MCH: 28.4 pg (ref 26.0–34.0)
MCH: 29.4 pg (ref 26.0–34.0)
MCHC: 32.5 g/dL (ref 30.0–36.0)
MCHC: 32.1 g/dL (ref 30.0–36.0)
MCHC: 32.8 g/dL (ref 30.0–36.0)
MCV: 88.9 fL (ref 80.0–100.0)
MCV: 88.7 fL (ref 80.0–100.0)
MCV: 89.4 fL (ref 80.0–100.0)
Platelets: 279 10*3/uL (ref 150–400)
Platelets: 306 10*3/uL (ref 150–400)
Platelets: 339 10*3/uL (ref 150–400)
RBC: 3.98 MIL/uL — ABNORMAL LOW (ref 4.22–5.81)
RBC: 4.43 MIL/uL (ref 4.22–5.81)
RBC: 4.36 MIL/uL (ref 4.22–5.81)
RDW: 16.2 % — ABNORMAL HIGH (ref 11.5–15.5)
RDW: 16.2 % — ABNORMAL HIGH (ref 11.5–15.5)
RDW: 16.4 % — ABNORMAL HIGH (ref 11.5–15.5)
WBC: 11.9 10*3/uL — ABNORMAL HIGH (ref 4.0–10.5)
WBC: 13.5 10*3/uL — ABNORMAL HIGH (ref 4.0–10.5)
WBC: 21.2 10*3/uL — ABNORMAL HIGH (ref 4.0–10.5)
nRBC: 0 % (ref 0.0–0.2)
nRBC: 0 % (ref 0.0–0.2)
nRBC: 0 % (ref 0.0–0.2)

## 2022-12-26 LAB — CULTURE, BLOOD (ROUTINE X 2)

## 2022-12-28 ENCOUNTER — Other Ambulatory Visit (HOSPITAL_COMMUNITY): Payer: Medicare Other

## 2022-12-28 LAB — CBC
HCT: 40.2 % (ref 39.0–52.0)
Hemoglobin: 13.2 g/dL (ref 13.0–17.0)
MCH: 30 pg (ref 26.0–34.0)
MCHC: 32.8 g/dL (ref 30.0–36.0)
MCV: 91.4 fL (ref 80.0–100.0)
Platelets: 349 10*3/uL (ref 150–400)
RBC: 4.4 MIL/uL (ref 4.22–5.81)
RDW: 16.4 % — ABNORMAL HIGH (ref 11.5–15.5)
WBC: 27.4 10*3/uL — ABNORMAL HIGH (ref 4.0–10.5)
nRBC: 0 % (ref 0.0–0.2)

## 2022-12-28 LAB — URINALYSIS, ROUTINE W REFLEX MICROSCOPIC
Bilirubin Urine: NEGATIVE
Glucose, UA: NEGATIVE mg/dL
Hgb urine dipstick: NEGATIVE
Ketones, ur: NEGATIVE mg/dL
Nitrite: NEGATIVE
Protein, ur: >=300 mg/dL — AB
Specific Gravity, Urine: 1.019 (ref 1.005–1.030)
pH: 8 (ref 5.0–8.0)

## 2022-12-28 LAB — CULTURE, BLOOD (ROUTINE X 2)

## 2022-12-28 LAB — MIC RESULT

## 2022-12-29 ENCOUNTER — Other Ambulatory Visit (HOSPITAL_COMMUNITY): Payer: Medicare Other

## 2022-12-29 LAB — BLOOD CULTURE ID PANEL (REFLEXED) - BCID2

## 2022-12-29 LAB — CULTURE, BLOOD (ROUTINE X 2): Culture: NO GROWTH

## 2022-12-29 LAB — URINE CULTURE

## 2022-12-29 LAB — MINIMUM INHIBITORY CONC. (1 DRUG)

## 2022-12-30 LAB — CULTURE, BLOOD (ROUTINE X 2): Special Requests: ADEQUATE

## 2022-12-30 LAB — URINE CULTURE: Culture: >=100000 — AB

## 2022-12-31 LAB — BASIC METABOLIC PANEL
Anion gap: 9 (ref 5–15)
BUN: 34 mg/dL — ABNORMAL HIGH (ref 8–23)
CO2: 29 mmol/L (ref 22–32)
Calcium: 8.2 mg/dL — ABNORMAL LOW (ref 8.9–10.3)
Chloride: 98 mmol/L (ref 98–111)
Creatinine, Ser: 1.06 mg/dL (ref 0.61–1.24)
GFR, Estimated: >60 mL/min (ref >60)
Glucose, Bld: 171 mg/dL — ABNORMAL HIGH (ref 70–99)
Potassium: 3.3 mmol/L — ABNORMAL LOW (ref 3.5–5.1)
Sodium: 136 mmol/L (ref 135–145)

## 2022-12-31 LAB — CBC WITH DIFFERENTIAL/PLATELET
Abs Immature Granulocytes: 0.12 10*3/uL — ABNORMAL HIGH (ref 0.00–0.07)
Basophils Absolute: 0.1 10*3/uL (ref 0.0–0.1)
Basophils Relative: 1 %
Eosinophils Absolute: 0.1 10*3/uL (ref 0.0–0.5)
Eosinophils Relative: 0 %
HCT: 33.6 % — ABNORMAL LOW (ref 39.0–52.0)
Hemoglobin: 10.7 g/dL — ABNORMAL LOW (ref 13.0–17.0)
Immature Granulocytes: 1 %
Lymphocytes Relative: 12 %
Lymphs Abs: 1.9 10*3/uL (ref 0.7–4.0)
MCH: 28.1 pg (ref 26.0–34.0)
MCHC: 31.8 g/dL (ref 30.0–36.0)
MCV: 88.2 fL (ref 80.0–100.0)
Monocytes Absolute: 1.6 10*3/uL — ABNORMAL HIGH (ref 0.1–1.0)
Monocytes Relative: 10 %
Neutro Abs: 12.4 10*3/uL — ABNORMAL HIGH (ref 1.7–7.7)
Neutrophils Relative %: 76 %
Platelets: 308 10*3/uL (ref 150–400)
RBC: 3.81 MIL/uL — ABNORMAL LOW (ref 4.22–5.81)
RDW: 16.3 % — ABNORMAL HIGH (ref 11.5–15.5)
WBC: 16.2 10*3/uL — ABNORMAL HIGH (ref 4.0–10.5)
nRBC: 0 % (ref 0.0–0.2)

## 2022-12-31 LAB — CULTURE, BLOOD (ROUTINE X 2)
Culture: NO GROWTH
Special Requests: ADEQUATE

## 2022-12-31 LAB — DIGOXIN LEVEL: Digoxin Level: 0.3 ng/mL — ABNORMAL LOW (ref 0.8–2.0)

## 2022-12-31 LAB — MAGNESIUM: Magnesium: 2.3 mg/dL (ref 1.7–2.4)

## 2023-01-01 LAB — CULTURE, BLOOD (ROUTINE X 2)

## 2023-01-01 LAB — HEPATIC FUNCTION PANEL
ALT: 260 U/L — ABNORMAL HIGH (ref 0–44)
AST: 257 U/L — ABNORMAL HIGH (ref 15–41)
Albumin: 1.6 g/dL — ABNORMAL LOW (ref 3.5–5.0)
Alkaline Phosphatase: 176 U/L — ABNORMAL HIGH (ref 38–126)
Bilirubin, Direct: 0.3 mg/dL — ABNORMAL HIGH (ref 0.0–0.2)
Indirect Bilirubin: 0.4 mg/dL (ref 0.3–0.9)
Total Bilirubin: 0.7 mg/dL (ref 0.3–1.2)
Total Protein: 6.2 g/dL — ABNORMAL LOW (ref 6.5–8.1)

## 2023-01-01 LAB — BASIC METABOLIC PANEL
Anion gap: 8 (ref 5–15)
BUN: 24 mg/dL — ABNORMAL HIGH (ref 8–23)
CO2: 32 mmol/L (ref 22–32)
Calcium: 8.3 mg/dL — ABNORMAL LOW (ref 8.9–10.3)
Chloride: 98 mmol/L (ref 98–111)
Creatinine, Ser: 0.91 mg/dL (ref 0.61–1.24)
GFR, Estimated: >60 mL/min (ref >60)
Glucose, Bld: 146 mg/dL — ABNORMAL HIGH (ref 70–99)
Potassium: 3.7 mmol/L (ref 3.5–5.1)
Sodium: 138 mmol/L (ref 135–145)

## 2023-01-02 ENCOUNTER — Other Ambulatory Visit (HOSPITAL_COMMUNITY): Payer: Medicare Other

## 2023-01-02 LAB — CULTURE, BLOOD (ROUTINE X 2)

## 2023-01-03 LAB — CULTURE, BLOOD (ROUTINE X 2)

## 2023-01-04 ENCOUNTER — Telehealth: Payer: Self-pay | Admitting: Nurse Practitioner

## 2023-01-04 LAB — CBC
HCT: 35.2 % — ABNORMAL LOW (ref 39.0–52.0)
Hemoglobin: 11 g/dL — ABNORMAL LOW (ref 13.0–17.0)
MCH: 27.8 pg (ref 26.0–34.0)
MCHC: 31.3 g/dL (ref 30.0–36.0)
MCV: 88.9 fL (ref 80.0–100.0)
Platelets: 391 10*3/uL (ref 150–400)
RBC: 3.96 MIL/uL — ABNORMAL LOW (ref 4.22–5.81)
RDW: 15.9 % — ABNORMAL HIGH (ref 11.5–15.5)
WBC: 10.2 10*3/uL (ref 4.0–10.5)
nRBC: 0 % (ref 0.0–0.2)

## 2023-01-04 LAB — COMPREHENSIVE METABOLIC PANEL
ALT: 152 U/L — ABNORMAL HIGH (ref 0–44)
AST: 62 U/L — ABNORMAL HIGH (ref 15–41)
Albumin: 1.8 g/dL — ABNORMAL LOW (ref 3.5–5.0)
Alkaline Phosphatase: 117 U/L (ref 38–126)
Anion gap: 9 (ref 5–15)
BUN: 23 mg/dL (ref 8–23)
CO2: 30 mmol/L (ref 22–32)
Calcium: 8.5 mg/dL — ABNORMAL LOW (ref 8.9–10.3)
Chloride: 96 mmol/L — ABNORMAL LOW (ref 98–111)
Creatinine, Ser: 0.9 mg/dL (ref 0.61–1.24)
GFR, Estimated: >60 mL/min (ref >60)
Glucose, Bld: 126 mg/dL — ABNORMAL HIGH (ref 70–99)
Potassium: 4.2 mmol/L (ref 3.5–5.1)
Sodium: 135 mmol/L (ref 135–145)
Total Bilirubin: 0.8 mg/dL (ref 0.3–1.2)
Total Protein: 7.3 g/dL (ref 6.5–8.1)

## 2023-01-04 LAB — CULTURE, BLOOD (ROUTINE X 2): Culture: NO GROWTH

## 2023-01-04 NOTE — Telephone Encounter (Signed)
Spoke with Chip Boer to see how we could assist. Chip Boer wanted to verify that the contact's she had for the patient were current and their number matched the number she had. The number she had matched the number we have in the patient chart. Informed Chip Boer that  we do not have any other contacts or a POA listed in patient chart. Voice to Chip Boer if we could provide any other assist please let us know.

## 2023-01-04 NOTE — Consult Note (Signed)
Chief Complaint: Patient was seen in consultation today for dysphagia  Referring Physician(s): Dr. Luna Kitchens  Supervising Physician: Marliss Coots  Patient Status: [facility] - Out-pt  History of Present Illness: [name] is a 72 y.o. male with past medical history of brain injury with residual dysarthria, schizo affective schizophrenia/schizophrenia admitted to [facility] from [facility] for debility after treatment of urosepsis, decubitus ulcers.  Patient with difficulty chewing/swallowing.  Has slurred speech consistent with his history of dysarthria.  He is currently NGT dependent.  Plan is for eventual discharge to SNF.  He will need percutaneous gastrostomy tube placement for discharge disposition once medically ready. IR consulted for same.  Case reviewed by Dr. Grace Isaac who approves patient for the procedure.  His WBC was elevated last week, peaking at 27.4 (5/27), now WNL at 10.2.  He has no concerns for acute infection and is being treated with daptomycin per ID recommendation.   Past Medical History:  Diagnosis Date   Acute urinary retention    Anxiety    Brain injury (HCC) 1975   Complication of anesthesia 08/29/13   difficulty due to large tongue, anterior larynx and limited opening   Difficult intubation 08/31/13   Head injury 1975   "Brain Stem Contusion"   Manic depression (HCC)    Pelvic fracture (HCC) 09/21/13   Inferior Pubic Ramus, Left Superior Ramus   Schizo affective schizophrenia (HCC)    Schizophrenia (HCC)     Past Surgical History:  Procedure Laterality Date   COSMETIC SURGERY Left 1975   created new ear lobe   DRESSING CHANGE UNDER ANESTHESIA Left 08/31/2013   Procedure: DRESSING CHANGE UNDER ANESTHESIA for right ankle and left thigh;  Surgeon: Budd Palmer, MD;  Location: [facility] OR;  Service: Orthopedics;  Laterality: Left;   I & D EXTREMITY Bilateral 08/30/2013   Procedure: IRRIGATION AND DEBRIDEMENT with closure Left Thigh wound, Irrigation and debridement  Right Ankle ;  Surgeon: Harvie Junior, MD;  Location: [facility] OR;  Service: Orthopedics;  Laterality: Bilateral;   I & D EXTREMITY Left 09/26/2013   Procedure: LEFT IRRIGATION AND DEBRIDEMENT Robby Sermon;  Surgeon: Budd Palmer, MD;  Location: [facility] OR;  Service: Orthopedics;  Laterality: Left;   I & D EXTREMITY Left 10/02/2013   Procedure: IRRIGATION AND DEBRIDEMENT EXTREMITY;  Surgeon: Budd Palmer, MD;  Location: [facility] OR;  Service: Orthopedics;  Laterality: Left;   INCISION AND DRAINAGE OF WOUND Left 09/21/2013   Procedure: IRRIGATION AND DEBRIDEMENT SOFT TISSUE WOUND WITH LARGE WOUND VAC PLACEMENT;  Surgeon: Budd Palmer, MD;  Location: [facility] OR;  Service: Orthopedics;  Laterality: Left;   MANDIBLE FRACTURE SURGERY  1975   SACRO-ILIAC PINNING Left 08/31/2013   Procedure: LEFT SACRO-ILIAC PINNING;  Surgeon: Budd Palmer, MD;  Location: [facility] OR;  Service: Orthopedics;  Laterality: Left;   SKIN GRAFT     SKIN GRAFT Left 10/31/2013   left thigh      DR HANDY    SKIN SPLIT GRAFT Left 10/02/2013   Procedure: SKIN GRAFT SPLIT THICKNESS;  Surgeon: Budd Palmer, MD;  Location: [facility] OR;  Service: Orthopedics;  Laterality: Left;   SKIN SPLIT GRAFT Left 10/31/2013   Procedure: LEFT THIGH SKIN GRAFT SPLIT THICKNESS;  Surgeon: Budd Palmer, MD;  Location: [facility] OR;  Service: Orthopedics;  Laterality: Left;   TEE WITHOUT CARDIOVERSION N/A 12/21/2022   Procedure: TRANSESOPHAGEAL ECHOCARDIOGRAM;  Surgeon: Lewayne Bunting, MD;  Location: [facility] INVASIVE CV LAB;  Service: Cardiovascular;  Laterality: N/A;    Allergies: Penicillins  Medications: Prior to Admission medications   Medication Sig Start Date End Date Taking? Authorizing Provider  acetaminophen (TYLENOL) 325 MG tablet Place 2 tablets (650 mg total) into feeding tube every 6 (six) hours as needed for mild pain (or Fever >/= 101). 12/23/22   Norton Blizzard, NP  acetaminophen (TYLENOL) 650 MG suppository Place 1 suppository (650 mg total) rectally  every 6 (six) hours as needed for mild pain (or Fever >/= 101). 12/23/22   Norton Blizzard, NP  albuterol (PROVENTIL) (2.5 MG/3ML) 0.083% nebulizer solution Take 3 mLs (2.5 mg total) by nebulization every 2 (two) hours as needed for wheezing. 12/23/22   Norton Blizzard, NP  busPIRone (BUSPAR) 15 MG tablet Place 1 tablet (15 mg total) into feeding tube 3 (three) times daily. 12/23/22   Norton Blizzard, NP  Chlorhexidine Gluconate Cloth 2 % PADS Apply 6 each topically daily. 12/24/22   Norton Blizzard, NP  DAPTOmycin 650 mg in sodium chloride 0.9 % 50 mL Inject 650 mg into the vein daily at 8 pm. 12/23/22   Norton Blizzard, NP  docusate (COLACE) 50 MG/5ML liquid Place 10 mLs (100 mg total) into feeding tube 2 (two) times daily as needed for mild constipation. 12/23/22   Norton Blizzard, NP  ezetimibe (ZETIA) 10 MG tablet Place 1 tablet (10 mg total) into feeding tube daily. 12/24/22   Norton Blizzard, NP  feeding supplement (ENSURE ENLIVE / ENSURE PLUS) LIQD Take 237 mLs by mouth 3 (three) times daily between meals. 12/23/22   Norton Blizzard, NP  folic acid (FOLVITE) 1 MG tablet Place 1 tablet (1 mg total) into feeding tube daily. 12/24/22   Selmer Dominion B, NP  heparin 5000 UNIT/ML injection Inject 1 mL (5,000 Units total) into the skin every 8 (eight) hours. 12/23/22   Norton Blizzard, NP  insulin aspart (NOVOLOG) 100 UNIT/ML injection Inject 0-15 Units into the skin every 4 (four) hours. 12/23/22   Norton Blizzard, NP  melatonin 3 MG TABS tablet Place 1 tablet (3 mg total) into feeding tube at bedtime. 12/23/22   Norton Blizzard, NP  midodrine (PROAMATINE) 10 MG tablet Place 2 tablets (20 mg total) into feeding tube every 8 (eight) hours. 12/23/22   Norton Blizzard, NP  Multiple Vitamin (MULTIVITAMIN WITH MINERALS) TABS tablet Place 1 tablet into feeding tube daily. 12/24/22   Norton Blizzard, NP  nutrition supplement, JUVEN, (JUVEN) PACK Place 1 packet into feeding tube 2 (two) times daily between  meals. 12/23/22   Norton Blizzard, NP  Nutritional Supplements (FEEDING SUPPLEMENT, OSMOLITE 1.5 CAL,) LIQD Place 1,000 mLs into feeding tube daily. 12/23/22   Norton Blizzard, NP  OLANZapine (ZYPREXA) 20 MG tablet Place 1 tablet (20 mg total) into feeding tube at bedtime. 12/23/22   Norton Blizzard, NP  polyethylene glycol (MIRALAX / GLYCOLAX) 17 g packet Place 17 g into feeding tube daily as needed for moderate constipation. 12/23/22   Norton Blizzard, NP  Protein (FEEDING SUPPLEMENT, PROSOURCE TF20,) liquid Place 60 mLs into feeding tube daily. 12/24/22   Norton Blizzard, NP  rosuvastatin (CRESTOR) 40 MG tablet Place 1 tablet (40 mg total) into feeding tube daily. 12/24/22   Norton Blizzard, NP  thiamine (VITAMIN B-1) 100 MG tablet Place 1 tablet (100 mg total) into feeding tube daily. 12/24/22   Norton Blizzard, NP     Family History  Problem  Relation Age of Onset   Diabetes Mother    Obsessive Compulsive Disorder Father    Bone cancer Father    Colon cancer Neg Hx    Rectal cancer Neg Hx    Liver cancer Neg Hx    Pancreatic cancer Neg Hx     Social History   Socioeconomic History   Marital status: Single    Spouse name: Not on file   Number of children: Not on file   Years of education: Not on file   Highest education level: Not on file  Occupational History   Not on file  Tobacco Use   Smoking status: Every Day    Types: Cigars   Smokeless tobacco: Never  Vaping Use   Vaping Use: Never used  Substance and Sexual Activity   Alcohol use: Yes    Alcohol/week: 6.0 standard drinks of alcohol    Types: 6 Cans of beer per week    Comment: weekly   Drug use: No   Sexual activity: Never    Comment: Quit smolikg 08/29/2013  Other Topics Concern   Not on file  Social History Narrative   Not on file   Social Determinants of Health   Financial Resource Strain: Medium Risk (03/18/2022)   Overall Financial Resource Strain (CARDIA)    Difficulty of Paying Living Expenses:  Somewhat hard  Food Insecurity: Food Insecurity Present (11/11/2022)   Hunger Vital Sign    Worried About Programme researcher, broadcasting/film/video in the Last Year: Often true    Ran Out of Food in the Last Year: Often true  Transportation Needs: Unmet Transportation Needs (11/11/2022)   PRAPARE - Administrator, Civil Service (Medical): Yes    Lack of Transportation (Non-Medical): Yes  Physical Activity: Sufficiently Active (03/20/2022)   Exercise Vital Sign    Days of Exercise per Week: 7 days    Minutes of Exercise per Session: 30 min  Stress: No Stress Concern Present (03/20/2022)   [employer] of Occupational Health - Occupational Stress Questionnaire    Feeling of Stress : Not at all  Social Connections: Socially Isolated (03/20/2022)   Social Connection and Isolation Panel [NHANES]    Frequency of Communication with Friends and Family: More than three times a week    Frequency of Social Gatherings with Friends and Family: More than three times a week    Attends Religious Services: Never    Database administrator or Organizations: No    Attends Banker Meetings: Never    Marital Status: Never married     Review of Systems: A 12 point ROS discussed and pertinent positives are indicated in the HPI above.  All other systems are negative.  Review of Systems  Constitutional:  Negative for fatigue and fever.  Respiratory:  Negative for cough and shortness of breath.   Cardiovascular:  Negative for chest pain.  Gastrointestinal:  Negative for abdominal pain, nausea and vomiting.  Musculoskeletal:  Negative for back pain.  Psychiatric/Behavioral:  Negative for behavioral problems and confusion.     Vital Signs: There were no vitals taken for this visit.  Physical Exam Vitals and nursing note reviewed.  Constitutional:      General: He is not in acute distress.    Appearance: Normal appearance. He is not ill-appearing.  HENT:     Mouth/Throat:     Mouth: Mucous  membranes are moist.     Pharynx: Oropharynx is clear.  Cardiovascular:  Rate and Rhythm: Regular rhythm. Tachycardia present.  Pulmonary:     Effort: Pulmonary effort is normal.     Breath sounds: Normal breath sounds.  Abdominal:     General: Abdomen is flat. There is no distension.     Palpations: Abdomen is soft.  Skin:    General: Skin is warm and dry.  Neurological:     General: No focal deficit present.     Mental Status: He is alert and oriented to person, place, and time. Mental status is at baseline.  Psychiatric:        Mood and Affect: Mood normal.        Behavior: Behavior normal.        Thought Content: Thought content normal.        Judgment: Judgment normal.      MD Evaluation Airway: WNL Heart: WNL Abdomen: WNL Chest/ Lungs: WNL ASA  Classification: 3   Imaging: DG Knee Left Port  Result Date: 01/02/2023 CLINICAL DATA:  Fall from incline EXAM: PORTABLE LEFT KNEE - 1-2 VIEW; PORTABLE RIGHT KNEE - 1-2 VIEW COMPARISON:  None available FINDINGS: Right knee: No fracture, dislocation, or soft tissue abnormality. Left knee: No fracture, dislocation, or soft tissue abnormality. IMPRESSION: No radiographic abnormality of the knees. Electronically Signed   By: Acquanetta Belling M.D.   On: 01/02/2023 08:14   DG Knee Right Port  Result Date: 01/02/2023 CLINICAL DATA:  Fall from incline EXAM: PORTABLE LEFT KNEE - 1-2 VIEW; PORTABLE RIGHT KNEE - 1-2 VIEW COMPARISON:  None available FINDINGS: Right knee: No fracture, dislocation, or soft tissue abnormality. Left knee: No fracture, dislocation, or soft tissue abnormality. IMPRESSION: No radiographic abnormality of the knees. Electronically Signed   By: Acquanetta Belling M.D.   On: 01/02/2023 08:14   CT HEAD WO CONTRAST ( )  Result Date: 01/02/2023 CLINICAL DATA:  72 year old male "minor head trauma". EXAM: CT HEAD WITHOUT CONTRAST TECHNIQUE: Contiguous axial images were obtained from the base of the skull through the vertex  without intravenous contrast. RADIATION DOSE REDUCTION: This exam was performed according to the departmental dose-optimization program which includes automated exposure control, adjustment of the mA and/or kV according to patient size and/or use of iterative reconstruction technique. COMPARISON:  Head CT 12/17/2022. FINDINGS: Brain: Stable cerebral volume. Stable sulci. No ventriculomegaly. No midline shift, mass effect, or evidence of intracranial mass lesion. No acute intracranial hemorrhage identified. No cortically based acute infarct identified. No convincing encephalomalacia. Vascular: No suspicious intracranial vascular hyperdensity. Calcified atherosclerosis at the skull base. Skull: No acute osseous abnormality identified. Sinuses/Orbits: Right nasoenteric tube in place now, previously on the left side. Visualized paranasal sinuses and mastoids are stable and well aerated. Other: Trace retained secretions in the visible pharynx. Orbit and scalp soft tissues appear negative. IMPRESSION: 1. No acute traumatic injury identified. Normal for age noncontrast CT appearance of the brain. 2. Right nasoenteric tube in place. Electronically Signed   By: Odessa Fleming M.D.   On: 01/02/2023 07:18   DG Abd Portable 1V  Result Date: 12/28/2022 CLINICAL DATA:  Nasogastric tube placement. EXAM: PORTABLE ABDOMEN - 1 VIEW COMPARISON:  12/23/2022 FINDINGS: Tip and side port of the enteric tube below the diaphragm in the stomach. No dilatation of upper abdominal bowel loops. IMPRESSION: Tip and side port of the enteric tube below the diaphragm in the stomach. Electronically Signed   By: Narda Rutherford M.D.   On: 12/28/2022 16:36   DG Chest Port 1 View  Result Date: 12/28/2022 CLINICAL  DATA:  Fever. EXAM: PORTABLE CHEST 1 VIEW COMPARISON:  Chest x-ray dated Dec 15, 2022. FINDINGS: The heart size and mediastinal contours are within normal limits. Normal pulmonary vascularity. No focal consolidation, pleural effusion, or  pneumothorax. No acute osseous abnormality. Old left proximal humerus fracture deformity again noted. IMPRESSION: 1. No active disease. Electronically Signed   By: Obie Dredge M.D.   On: 12/28/2022 10:25   DG Abd 1 View  Result Date: 12/23/2022 CLINICAL DATA:  Feeding tube placement. EXAM: ABDOMEN - 1 VIEW COMPARISON:  Film earlier today at 0957 hours FINDINGS: Weighted feeding tube tip located in the expected region of the proximal duodenum/duodenal bulb. Bowel gas pattern is unremarkable. IMPRESSION: Weighted feeding tube tip in the expected region of the proximal duodenum/duodenal bulb. Electronically Signed   By: Irish Lack M.D.   On: 12/23/2022 19:32   DG Abd Portable 1V  Result Date: 12/23/2022 CLINICAL DATA:  [id number] Encounter for feeding tube placement [id number] EXAM: PORTABLE ABDOMEN - 1 VIEW COMPARISON:  None Available. FINDINGS: Feeding tube tip overlies the region of the distal stomach or proximal duodenum. Nonobstructive upper abdominal bowel gas pattern. Left basilar subsegmental atelectasis. IMPRESSION: Feeding tube tip overlies the region of the distal stomach or proximal duodenum. Electronically Signed   By: Caprice Renshaw M.D.   On: 12/23/2022 10:27   CT ABDOMEN PELVIS W CONTRAST  Result Date: 12/22/2022 CLINICAL DATA:  MRSA sepsis and fever. EXAM: CT ABDOMEN AND PELVIS WITH CONTRAST TECHNIQUE: Multidetector CT imaging of the abdomen and pelvis was performed using the standard protocol following bolus administration of intravenous contrast. RADIATION DOSE REDUCTION: This exam was performed according to the departmental dose-optimization program which includes automated exposure control, adjustment of the mA and/or kV according to patient size and/or use of iterative reconstruction technique. CONTRAST:  75mL OMNIPAQUE IOHEXOL 350 MG/ML SOLN COMPARISON:  12/15/2022 FINDINGS: Lower chest: Trace left basilar pleural fluid and mild bibasilar atelectasis. Hepatobiliary: No focal liver  abnormality is seen. Interval gallbladder contraction. Potential tiny gallstones without overt gallbladder wall thickening or biliary dilatation. Pancreas: Unremarkable. No pancreatic ductal dilatation or surrounding inflammatory changes. Spleen: Normal in size without focal abnormality. Adrenals/Urinary Tract: Suggestion of mild hydronephrosis has resolved bilaterally. The adrenal glands are normal. No evidence renal masses or calculi. The bladder is decompressed by a Foley catheter. Somewhat prominent appearance of the bladder wall despite decompression. This is nonspecific but may implicate chronic outlet obstruction or inflammation/cystitis. Stomach/Bowel: Feeding tube extends to the level of the body of the stomach. Bowel shows no evidence of obstruction, ileus, inflammation or lesion. The appendix is normal. No free intraperitoneal air. Vascular/Lymphatic: Mild atherosclerosis of the abdominal aorta without aneurysm or dissection. No enlarged lymph nodes identified in the abdomen or pelvis. Reproductive: Stable moderate prostate enlargement. Other: Mild vague edema in the central mesenteric fat, more prominent compared to the prior study. This is nonspecific and is not associated with ascites or abscess. Musculoskeletal: Stable degenerative disc disease at L3-4 and L5-S1. Stable screw fixation across the left SI joint. Stable old left superior and inferior pubic rami fractures with nonunion of the superior ramus fracture. IMPRESSION: 1. Trace left basilar pleural fluid and mild bibasilar atelectasis. 2. Interval contraction of the gallbladder with potential tiny gallstones. No evidence of cholecystitis. 3. Mild vague edema in the central mesenteric fat, more prominent compared to the prior study. This is nonspecific and is not associated with ascites or abscess. 4. Bladder decompressed by Foley catheter. Somewhat prominent appearance of the bladder wall  despite decompression. This is nonspecific but may  implicate chronic outlet obstruction or inflammation/cystitis. 5. Stable moderate prostate enlargement. 6. Stable degenerative disc disease at L3-4 and L5-S1. 7. 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 8. Aortic atherosclerosis. Aortic Atherosclerosis (ICD10-I70.0). Electronically Signed   By: Irish Lack M.D.   On: 12/22/2022 15:24   DG Abd Portable 1V  Result Date: 12/21/2022 CLINICAL DATA:  Enteric catheter placement EXAM: PORTABLE ABDOMEN - 1 VIEW COMPARISON:  12/21/2022 FINDINGS: Frontal view of the lower chest and upper abdomen demonstrate enteric catheter tip projecting over gastric body, not appreciably changed since prior exam. Stable nonspecific bowel gas pattern. IMPRESSION: 1. Enteric catheter tip projecting over the gastric body, unchanged. Electronically Signed   By: Sharlet Salina M.D.   On: 12/21/2022 18:43   DG Abd Portable 1V  Result Date: 12/21/2022 CLINICAL DATA:  Nasogastric tube placement. EXAM: PORTABLE ABDOMEN - 1 VIEW COMPARISON:  Dec 16, 2022 FINDINGS: Enteric catheter projects over the expected location of the gastric body. Nonspecific bowel gas pattern. IMPRESSION: Enteric catheter projects over the expected location of the gastric body. Electronically Signed   By: Ted Mcalpine M.D.   On: 12/21/2022 17:38   ECHO TEE  Result Date: 12/21/2022    TRANSESOPHOGEAL ECHO REPORT   Patient Name:   [name] Date of Exam: 12/21/2022 Medical Rec #:  [id number]       Height:       74.0 in Accession #:    [id number]      Weight:       186.3 lb Date of Birth:  1950/08/27       BSA:          2.109 m Patient Age:    71 years        BP:           104/53 mmHg Patient Gender: M               HR:           75 bpm. Exam Location:  Inpatient Procedure: Transesophageal Echo, Cardiac Doppler and Color Doppler Indications:    Bacteremia  History:        Patient has prior history of Echocardiogram examinations, most                  recent 12/16/2022. ETOH; Risk Factors:Dyslipidemia, Hypertension                 and Current Smoker.  Sonographer:    Dondra Prader RVT RCS Referring Phys: [facility] PROCEDURE: After discussion of the risks and benefits of a TEE, an informed consent was obtained from the patient. The transesophogeal probe was passed without difficulty through the esophogus of the patient. Sedation performed by different physician. The patient's vital signs; including heart rate, blood pressure, and oxygen saturation; remained stable throughout the procedure. The patient developed no complications during the procedure.  IMPRESSIONS  1. No vegetations.  2. Left ventricular ejection fraction, by estimation, is 60 to 65%. The left ventricle has normal function. The left ventricle has no regional wall motion abnormalities.  3. Right ventricular systolic function is normal. The right ventricular size is normal.  4. No left atrial/left atrial appendage thrombus was detected.  5. The mitral valve is normal in structure. Trivial mitral valve regurgitation. No evidence of mitral stenosis.  6. The aortic valve is normal in structure. Aortic valve regurgitation is trivial. No  aortic stenosis is present.  7. There is Moderate (Grade III) plaque involving the descending aorta. FINDINGS  Left Ventricle: Left ventricular ejection fraction, by estimation, is 60 to 65%. The left ventricle has normal function. The left ventricle has no regional wall motion abnormalities. The left ventricular internal cavity size was normal in size. There is  no left ventricular hypertrophy. Right Ventricle: The right ventricular size is normal. Right ventricular systolic function is normal. Left Atrium: Left atrial size was normal in size. No left atrial/left atrial appendage thrombus was detected. Right Atrium: Right atrial size was normal in size. Pericardium: There is no evidence of pericardial effusion. Mitral Valve: The mitral valve is normal in structure.  Trivial mitral valve regurgitation. No evidence of mitral valve stenosis. Tricuspid Valve: The tricuspid valve is normal in structure. Tricuspid valve regurgitation is trivial. No evidence of tricuspid stenosis. Aortic Valve: The aortic valve is normal in structure. Aortic valve regurgitation is trivial. No aortic stenosis is present. Pulmonic Valve: The pulmonic valve was normal in structure. Pulmonic valve regurgitation is trivial. No evidence of pulmonic stenosis. Aorta: The aortic root is normal in size and structure. There is moderate (Grade III) plaque involving the descending aorta. IAS/Shunts: No atrial level shunt detected by color flow Doppler. Additional Comments: No vegetations. [name] MD Electronically signed by [name] MD Signature Date/Time: 12/21/2022/2:53:23 PM    Final    EP STUDY  Result Date: 12/21/2022 See surgical note for result.  [place] EKG SITE RITE  Result Date: 12/18/2022 If Site Rite image not attached, placement could not be confirmed due to current cardiac rhythm.  CT HEAD WO CONTRAST ( )  Result Date: 12/18/2022 CLINICAL DATA:  Initial evaluation for neuro deficit, stroke suspected. EXAM: CT HEAD WITHOUT CONTRAST TECHNIQUE: Contiguous axial images were obtained from the base of the skull through the vertex without intravenous contrast. RADIATION DOSE REDUCTION: This exam was performed according to the departmental dose-optimization program which includes automated exposure control, adjustment of the mA and/or kV according to patient size and/or use of iterative reconstruction technique. COMPARISON:  Prior study from 12/15/2022. FINDINGS: Brain: Cerebral volume within normal limits. Mild chronic small vessel ischemic disease. No acute intracranial hemorrhage. No acute large vessel territory infarct. No mass lesion or midline shift. No hydrocephalus or extra-axial fluid collection. Vascular: No convincing abnormal hyperdense vessel. Scattered vascular  calcifications noted within the carotid siphons. Skull: Scalp soft tissues and calvarium demonstrate no acute finding. Sinuses/Orbits: Globes and orbital soft tissues within normal limits. Paranasal sinuses are largely clear. No mastoid effusion. Other: Nasogastric tube in place. IMPRESSION: Stable head CT.  No acute intracranial abnormality. Electronically Signed   By: Rise Mu M.D.   On: 12/18/2022 00:32   CT MAXILLOFACIAL W CONTRAST  Result Date: 12/17/2022 CLINICAL DATA:  Poor dentition EXAM: CT MAXILLOFACIAL WITH CONTRAST TECHNIQUE: Multidetector CT imaging of the maxillofacial structures was performed with intravenous contrast. Multiplanar CT image reconstructions were also generated. RADIATION DOSE REDUCTION: This exam was performed according to the departmental dose-optimization program which includes automated exposure control, adjustment of the mA and/or kV according to patient size and/or use of iterative reconstruction technique. CONTRAST:  75mL OMNIPAQUE IOHEXOL 350 MG/ML SOLN COMPARISON:  Head CT from 2 days ago FINDINGS: Osseous: Tooth 18 is fractured/eroded with large periapical erosion. The adjacent left mandibular body is sclerotic with some cortical thickening and asymmetrically pronounced torus, but site of prior surgery with wires. The bilateral TMJ is located. Remote fracture of the posterior wall left maxillary  sinus, likely from the same injury. Orbits: No evidence of mass or inflammation Sinuses: Clear. Soft tissues: Negative for abscess or soft tissue swelling. Limited intracranial: Negative IMPRESSION: Carious tooth 18 with large periapical erosion. Adjacent sclerosis in the mandible is likely sequela of prior fracture, condensing osteitis may contribute. No specific/acute osteomyelitis findings. No abscess. Electronically Signed   By: Tiburcio Pea M.D.   On: 12/17/2022 19:33   DG Abd Portable 1V  Result Date: 12/16/2022 CLINICAL DATA:  Feeding tube placement EXAM:  PORTABLE ABDOMEN - 1 VIEW COMPARISON:  Abdominal x-ray 11/11/2022 FINDINGS: Feeding tube tip is at the level of the gastric antrum. No dilated bowel loops are seen. IMPRESSION: Feeding tube tip is at the level of the gastric antrum. Electronically Signed   By: Darliss Cheney M.D.   On: 12/16/2022 17:07   ECHOCARDIOGRAM COMPLETE  Result Date: 12/16/2022    ECHOCARDIOGRAM REPORT   Patient Name:   [name] Date of Exam: 12/16/2022 Medical Rec #:  [id number]       Height:       74.0 in Accession #:    [id number]      Weight:       185.0 lb Date of Birth:  03/31/1951       BSA:          2.102 m Patient Age:    71 years        BP:           97/48 mmHg Patient Gender: M               HR:           105 bpm. Exam Location:  Inpatient Procedure: 2D Echo, Color Doppler and Cardiac Doppler Indications:    Shock  History:        Patient has prior history of Echocardiogram examinations, most                 recent 05/28/2021. Risk Factors:Hypertension, Dyslipidemia and                 ETOH.  Sonographer:    Darlys Gales Referring Phys: [id number] Beulah Gandy PAYNE  Sonographer Comments: Technically difficult study due to poor echo windows. IMPRESSIONS  1. Left ventricular ejection fraction, by estimation, is 55 to 60%. The left ventricle has normal function. The left ventricle has no regional wall motion abnormalities. Left ventricular diastolic parameters are indeterminate.  2. Right ventricular systolic function is normal. The right ventricular size is severely enlarged. Tricuspid regurgitation signal is inadequate for assessing PA pressure.  3. The mitral valve is normal in structure. No evidence of mitral valve regurgitation.  4. The aortic valve was not well visualized. Aortic valve regurgitation is not visualized.  5. The inferior vena cava is dilated in size with <50% respiratory variability, suggesting right atrial pressure of 15 mmHg. Comparison(s): Similar to prior; this study is more technically difficult. FINDINGS   Left Ventricle: Left ventricular ejection fraction, by estimation, is 55 to 60%. The left ventricle has normal function. The left ventricle has no regional wall motion abnormalities. The left ventricular internal cavity size was normal in size. There is  no left ventricular hypertrophy. Left ventricular diastolic parameters are indeterminate. Right Ventricle: Coronary sinus dilation. The right ventricular size is severely enlarged. Right vetricular wall thickness was not well visualized. Right ventricular systolic function is normal. Tricuspid regurgitation signal is inadequate for assessing PA pressure. Left Atrium: Left atrial size was normal in  size. Right Atrium: Right atrial size was normal in size. Pericardium: Trivial pericardial effusion is present. The pericardial effusion is posterior to the left ventricle. Mitral Valve: The mitral valve is normal in structure. No evidence of mitral valve regurgitation. Tricuspid Valve: The tricuspid valve is not well visualized. Tricuspid valve regurgitation is not demonstrated. Aortic Valve: The aortic valve was not well visualized. Aortic valve regurgitation is not visualized. Aortic valve mean gradient measures 3.0 mmHg. Aortic valve peak gradient measures 5.4 mmHg. Aortic valve area, by VTI measures 2.63 cm. Pulmonic Valve: The pulmonic valve was not well visualized. Pulmonic valve regurgitation is not visualized. Aorta: The aortic root and ascending aorta are structurally normal, with no evidence of dilitation. Venous: The inferior vena cava is dilated in size with less than 50% respiratory variability, suggesting right atrial pressure of 15 mmHg. IAS/Shunts: No atrial level shunt detected by color flow Doppler.  LEFT VENTRICLE PLAX 2D LVIDd:         4.00 cm   Diastology LVIDs:         2.50 cm   LV e' medial:    10.60 cm/s LV PW:         0.80 cm   LV E/e' medial:  10.8 LV IVS:        0.90 cm   LV e' lateral:   7.83 cm/s LVOT diam:     2.00 cm   LV E/e' lateral: 14.6  LV SV:         53 LV SV Index:   25 LVOT Area:     3.14 cm  RIGHT VENTRICLE RV S prime:     9.90 cm/s TAPSE (M-mode): 2.0 cm LEFT ATRIUM             Index        RIGHT ATRIUM           Index LA Vol (A2C):   41.5 ml 19.74 ml/m  RA Area:     11.00 cm LA Vol (A4C):   22.0 ml 10.46 ml/m  RA Volume:   22.90 ml  10.89 ml/m LA Biplane Vol: 30.9 ml 14.70 ml/m  AORTIC VALVE AV Area (Vmax):    2.33 cm AV Area (Vmean):   2.53 cm AV Area (VTI):     2.63 cm AV Vmax:           116.00 cm/s AV Vmean:          82.900 cm/s AV VTI:            0.201 m AV Peak Grad:      5.4 mmHg AV Mean Grad:      3.0 mmHg LVOT Vmax:         86.20 cm/s LVOT Vmean:        66.700 cm/s LVOT VTI:          0.168 m LVOT/AV VTI ratio: 0.84 MITRAL VALVE MV Area (PHT): 8.25 cm     SHUNTS MV Decel Time: 92 msec      Systemic VTI:  0.17 m MV E velocity: 114.00 cm/s  Systemic Diam: 2.00 cm Riley Lam MD Electronically signed by Riley Lam MD Signature Date/Time: 12/16/2022/12:58:35 PM    Final    DG Swallowing Func-Speech Pathology  Result Date: 12/16/2022 Table formatting from the original result was not included. Modified Barium Swallow Study Patient Details Name: [name] MRN: [id number] Date of Birth: [birth year] Today's Date: 12/16/2022 HPI/PMH: HPI: Patient is a 72 y.o. male with PMH:  anxiety, TBI, manic depression, schizo affective schizophrenia, dysphagia, chronic dysarthria. He was admitted last month and at time of discharge, recommended diet from SLP (following MBSx2) was Regular solids, honey thick liquids. Currently, patient presented to the hospital on 12/15/22 from [facility] for evaluation of AMS. In ED, patient encephalopathic, febrile at 104F, tachycardic and had purulent urine in his Foley catheter, hypotensive. He was admitted with sepsis and is NPO awaiting SLP swallow evaluation. Clinical Impression: Clinical Impression: Patient presents with a moderate oral dysphagia and a mild pharyngeal phase dysphagia as per  this MBS. As compared to most recent MBS completed one month ago, patient appears with improved swallow function. During today's test, patient exhibited delayed anterior to posterior transit of all boluses in oral cavity as well as mild-moderate amount of PO residuals remaining in oral cavity s/p initial swallow secondary to decreased base of tongue strength, decreased lingual ROM. During pharyngeal phase, swallow was initiated at level of vallecular sinus for all consistencies tested. (honey and nectar thick, thin, puree) Penetration above vocal cords of trace amount occured with thin and nectar thick liquids but was not consistent in occurance. Penetration to vocal cords occured with large sip of thin liquids. No aspiration observed with any of the tested barium consistencies. Barium residuals remained in vallecular sinus and pyriform sinus with all liquids but amount of residuals increased with thicker consistencies. (worse with honey thick) Pharyngeal phase dysphagia secondary to decreased base of tongue retraction, partial laryngeal elevation, partial epiglottic inversion and partial anterior hyoid excursion. Patient did not adequately sense residuals in pharynx. No penetration or aspiration observed from residuals after the swallow was initiated or completed. Positioning did not allow for adequate view of PES or below but prior MBS did report prominent cricopharyngeal bar. SLP recommending initiating PO diet of full liquids/thin consistency and meds crushed in puree. Factors that may increase risk of adverse event in presence of aspiration Rubye Oaks & Clearance Coots 2021): Factors that may increase risk of adverse event in presence of aspiration Rubye Oaks & Clearance Coots 2021): Presence of tubes (ETT, trach, NG, etc.) Recommendations/Plan: Swallowing Evaluation Recommendations Swallowing Evaluation Recommendations Recommendations: PO diet PO Diet Recommendation: Full liquid diet; Thin liquids (Level 0) Liquid Administration  via: Cup; No straw Medication Administration: Crushed with puree Supervision: Staff to assist with self-feeding; Full supervision/cueing for swallowing strategies Swallowing strategies  : Slow rate; Small bites/sips; Follow solids with liquids; Check for pocketing or oral holding Postural changes: Position pt fully upright for meals Oral care recommendations: Oral care BID (2x/day); Oral care before PO Recommended consults: Consider esophageal assessment Caregiver Recommendations: Avoid jello, ice cream, thin soups, popsicles; Have oral suction available Treatment Plan Treatment Plan Treatment recommendations: Therapy as outlined in treatment plan below Follow-up recommendations: Skilled nursing-short term rehab (<3 hours/day) Functional status assessment: Patient has had a recent decline in their functional status and demonstrates the ability to make significant improvements in function in a reasonable and predictable amount of time. Treatment frequency: Min 2x/week Treatment duration: 2 weeks Interventions: Oropharyngeal exercises; Trials of upgraded texture/liquids; Diet toleration management by SLP; Aspiration precaution training; Compensatory techniques; Patient/family education Recommendations Recommendations for follow up therapy are one component of a multi-disciplinary discharge planning process, led by the attending physician.  Recommendations may be updated based on patient status, additional functional criteria and insurance authorization. Assessment: Orofacial Exam: Orofacial Exam Oral Cavity: Oral Hygiene: WFL Oral Cavity - Dentition: Adequate natural dentition; Missing dentition Orofacial Anatomy: WFL Oral Motor/Sensory Function: WFL Anatomy: Anatomy: WFL Boluses Administered: Boluses Administered  Boluses Administered: Thin liquids (Level 0); Mildly thick liquids (Level 2, nectar thick); Moderately thick liquids (Level 3, honey thick); Puree  Oral Impairment Domain: Oral Impairment Domain Lip Closure:  Interlabial escape, no progression to anterior lip Tongue control during bolus hold: Posterior escape of less than half of bolus Bolus preparation/mastication: Timely and efficient chewing and mashing Bolus transport/lingual motion: Slow tongue motion Oral residue: Residue collection on oral structures Location of oral residue : Tongue; Palate Initiation of pharyngeal swallow : Valleculae  Pharyngeal Impairment Domain: Pharyngeal Impairment Domain Soft palate elevation: No bolus between soft palate (SP)/pharyngeal wall (PW) Laryngeal elevation: Partial superior movement of thyroid cartilage/partial approximation of arytenoids to epiglottic petiole Anterior hyoid excursion: Partial anterior movement Epiglottic movement: Partial inversion Laryngeal vestibule closure: Incomplete, narrow column air/contrast in laryngeal vestibule Pharyngeal stripping wave : Present - diminished Pharyngeal contraction (A/P view only): N/A Pharyngoesophageal segment opening: Partial distention/partial duration, partial obstruction of flow Tongue base retraction: Narrow column of contrast or air between tongue base and PPW Pharyngeal residue: Collection of residue within or on pharyngeal structures Location of pharyngeal residue: Valleculae  Esophageal Impairment Domain: Esophageal Impairment Domain Esophageal clearance upright position: -- (unable to adequately visualize but h/o cricopharyngeal bar) Pill: Esophageal Impairment Domain Esophageal clearance upright position: -- (unable to adequately visualize but h/o cricopharyngeal bar) Penetration/Aspiration Scale Score: Penetration/Aspiration Scale Score 1.  Material does not enter airway: Mildly thick liquids (Level 2, nectar thick); Moderately thick liquids (Level 3, honey thick); Puree 2.  Material enters airway, remains ABOVE vocal cords then ejected out: Moderately thick liquids (Level 3, honey thick) 3.  Material enters airway, remains ABOVE vocal cords and not ejected out: Thin  liquids (Level 0) 4.  Material enters airway, CONTACTS cords then ejected out: Puree 5.  Material enters airway, CONTACTS cords and not ejected out: Thin liquids (Level 0) 7.  Material enters airway, passes BELOW cords and not ejected out despite cough attempt by patient: Moderately thick liquids (Level 3, honey thick); Thin liquids (Level 0) 8.  Material enters airway, passes BELOW cords without attempt by patient to eject out (silent aspiration) : Thin liquids (Level 0); Mildly thick liquids (Level 2, nectar thick) Compensatory Strategies: Compensatory Strategies Compensatory strategies: Yes Straw: Ineffective Ineffective Straw: Mildly thick liquid (Level 2, nectar thick) Effortful swallow: Ineffective Liquid wash: Ineffective Ineffective Liquid Wash: Puree; Moderately thick liquid (Level 3, honey thick) (with pill simulation) Other(comment): -- (Cued cough: inconsistently effective) Ineffective Other(comment): Mildly thick liquid (Level 2, nectar thick) (declined to execute)   General Information: Caregiver present: No  Diet Prior to this Study: NPO   Temperature : Normal   Respiratory Status: WFL   Supplemental O2: Nasal cannula   History of Recent Intubation: No  Behavior/Cognition: Alert; Cooperative; Pleasant mood Self-Feeding Abilities: Needs set-up for self-feeding; Needs assist with self-feeding Baseline vocal quality/speech: Other (comment) (voice normal, speech dysarthric) Volitional Cough: Able to elicit Volitional Swallow: Able to elicit Exam Limitations: Other (comment) (discomfort with upright positioning) Goal Planning: Prognosis for improved oropharyngeal function: Fair Barriers to Reach Goals: Severity of deficits; Time post onset No data recorded Patient/Family Stated Goal: none stated by patient Consulted and agree with results and recommendations: Patient; Nurse; Physician Pain: Pain Assessment Pain Assessment: No/denies pain Pain Score: 0 Faces Pain Scale: 6 Breathing: 0 Negative Vocalization:  0 Facial Expression: 0 Body Language: 0 Consolability: 0 PAINAD Score: 0 Facial Expression: 0 Body Movements: 0 Muscle Tension: 0 Compliance with ventilator (intubated pts.): N/A Vocalization (extubated pts.): N/A CPOT Total:  0 Pain Location: buttocks Pain Descriptors / Indicators: Sore; Discomfort Pain Intervention(s): Limited activity within patient's tolerance; Monitored during session; Repositioned End of Session: Start Time:SLP Start Time (ACUTE ONLY): 1478 Stop Time: SLP Stop Time (ACUTE ONLY): 0940 Time Calculation:SLP Time Calculation (min) (ACUTE ONLY): 15 min Charges: SLP Evaluations $ SLP Speech Visit: 1 Visit SLP Evaluations $BSS Swallow: 1 Procedure $Swallowing Treatment: 1 Procedure SLP visit diagnosis: SLP Visit Diagnosis: Dysphagia, oropharyngeal phase (R13.12) Past Medical History: Past Medical History: Diagnosis Date  Acute urinary retention   Anxiety   Brain injury (HCC) 1975  Complication of anesthesia 08/29/13  difficulty due to large tongue, anterior larynx and limited opening  Difficult intubation 08/31/13  Head injury 1975  "Brain Stem Contusion"  Manic depression (HCC)   Pelvic fracture (HCC) 09/21/13  Inferior Pubic Ramus, Left Superior Ramus  Schizo affective schizophrenia (HCC)   Schizophrenia (HCC)  Past Surgical History: Past Surgical History: Procedure Laterality Date  COSMETIC SURGERY Left 1975  created new ear lobe  DRESSING CHANGE UNDER ANESTHESIA Left 08/31/2013  Procedure: DRESSING CHANGE UNDER ANESTHESIA for right ankle and left thigh;  Surgeon: Budd Palmer, MD;  Location: [facility] OR;  Service: Orthopedics;  Laterality: Left;  I & D EXTREMITY Bilateral 08/30/2013  Procedure: IRRIGATION AND DEBRIDEMENT with closure Left Thigh wound, Irrigation and debridement Right Ankle ;  Surgeon: Harvie Junior, MD;  Location: [facility] OR;  Service: Orthopedics;  Laterality: Bilateral;  I & D EXTREMITY Left 09/26/2013  Procedure: LEFT IRRIGATION AND DEBRIDEMENT Robby Sermon;  Surgeon: Budd Palmer,  MD;  Location: [facility] OR;  Service: Orthopedics;  Laterality: Left;  I & D EXTREMITY Left 10/02/2013  Procedure: IRRIGATION AND DEBRIDEMENT EXTREMITY;  Surgeon: Budd Palmer, MD;  Location: [facility] OR;  Service: Orthopedics;  Laterality: Left;  INCISION AND DRAINAGE OF WOUND Left 09/21/2013  Procedure: IRRIGATION AND DEBRIDEMENT SOFT TISSUE WOUND WITH LARGE WOUND VAC PLACEMENT;  Surgeon: Budd Palmer, MD;  Location: [facility] OR;  Service: Orthopedics;  Laterality: Left;  MANDIBLE FRACTURE SURGERY  1975  SACRO-ILIAC PINNING Left 08/31/2013  Procedure: LEFT SACRO-ILIAC PINNING;  Surgeon: Budd Palmer, MD;  Location: [facility] OR;  Service: Orthopedics;  Laterality: Left;  SKIN GRAFT    SKIN GRAFT Left 10/31/2013  left thigh      DR HANDY   SKIN SPLIT GRAFT Left 10/02/2013  Procedure: SKIN GRAFT SPLIT THICKNESS;  Surgeon: Budd Palmer, MD;  Location: [facility] OR;  Service: Orthopedics;  Laterality: Left;  SKIN SPLIT GRAFT Left 10/31/2013  Procedure: LEFT THIGH SKIN GRAFT SPLIT THICKNESS;  Surgeon: Budd Palmer, MD;  Location: [facility] OR;  Service: Orthopedics;  Laterality: Left; Angela Nevin, MA, CCC-SLP Speech Therapy   [place] EKG SITE RITE  Result Date: 12/15/2022 If Site Rite image not attached, placement could not be confirmed due to current cardiac rhythm.  CT ABDOMEN PELVIS W CONTRAST  Result Date: 12/15/2022 CLINICAL DATA:  Right lower quadrant abdominal pain. EXAM: CT ABDOMEN AND PELVIS WITH CONTRAST TECHNIQUE: Multidetector CT imaging of the abdomen and pelvis was performed using the standard protocol following bolus administration of intravenous contrast. RADIATION DOSE REDUCTION: This exam was performed according to the departmental dose-optimization program which includes automated exposure control, adjustment of the mA and/or kV according to patient size and/or use of iterative reconstruction technique. CONTRAST:  60mL OMNIPAQUE IOHEXOL 350 MG/ML SOLN COMPARISON:  12/18/2021. FINDINGS: Lower chest: Dependent lower lobe  opacities consistent with atelectasis. No acute findings. Hepatobiliary: Liver normal in size and overall  attenuation. There are several low-attenuation liver masses consistent with cysts, largest 1.3 cm in the left lobe, stable from the prior CT. No other liver abnormalities. Material of increased attenuation noted in the gallbladder neck suggesting sludge or small stones. No wall thickening or inflammation. No bile duct dilation. Pancreas: Unremarkable. No pancreatic ductal dilatation or surrounding inflammatory changes. Spleen: Normal in size without focal abnormality. Adrenals/Urinary Tract: Normal adrenal glands. Kidneys normal in size, orientation and position with symmetric enhancement, but relatively delayed excretion. Small exophytic cyst, anterior upper pole the left kidney, 8 mm. No follow-up indicated. No other renal masses. No stones. Mild distension of the renal collecting systems bilaterally as well as both ureters. No renal or ureteral stones. Bladder mostly decompressed by a Foley catheter. Stomach/Bowel: Stomach is within normal limits. Appendix appears normal. No evidence of bowel wall thickening, distention, or inflammatory changes. Vascular/Lymphatic: Aortic atherosclerosis. No aneurysm. No enlarged lymph nodes. Reproductive: Enlarged prostate, 4.5 cm transversely. Other: No abdominal wall hernia or abnormality. No abdominopelvic ascites. Musculoskeletal: No acute fracture or acute finding. Previous ORIF, with screws inserted from the ileum into the sacrum across the left SI joint. Left ilium bone islands. No aggressive bone lesion. Old left parasymphyseal fracture. IMPRESSION: 1. No acute findings within the abdomen or pelvis. 2. Mild dilation of the intrarenal collecting systems and ureters, relatively symmetric and presumed to be due to bladder outlet obstruction. Bladder mostly decompressed by a Foley catheter. No renal or ureteral stones. Enlarged prostate. 3. No bowel obstruction or  inflammation. 4. Aortic atherosclerosis. Electronically Signed   By: Amie Portland M.D.   On: 12/15/2022 14:56   CT HEAD WO CONTRAST ( )  Result Date: 12/15/2022 CLINICAL DATA:  Mental status change, unknown cause. EXAM: CT HEAD WITHOUT CONTRAST TECHNIQUE: Contiguous axial images were obtained from the base of the skull through the vertex without intravenous contrast. RADIATION DOSE REDUCTION: This exam was performed according to the departmental dose-optimization program which includes automated exposure control, adjustment of the mA and/or kV according to patient size and/or use of iterative reconstruction technique. COMPARISON:  Head CT 11/10/2022. FINDINGS: Brain: No acute intracranial hemorrhage. Gray-white differentiation is preserved. No hydrocephalus or extra-axial collection. No mass effect or midline shift. Vascular: No hyperdense vessel or unexpected calcification. Skull: No calvarial fracture or suspicious bone lesion. Skull base is unremarkable. Sinuses/Orbits: Unremarkable. Other: None. IMPRESSION: No acute intracranial abnormality. Electronically Signed   By: Orvan Falconer M.D.   On: 12/15/2022 14:54   DG Chest Port 1 View  Result Date: 12/15/2022 CLINICAL DATA:  Questionable sepsis, evaluate for abnormality. EXAM: PORTABLE CHEST 1 VIEW COMPARISON:  11/11/2022. FINDINGS: Low lung volumes accentuate the pulmonary vasculature and cardiomediastinal silhouette. No consolidation or pulmonary edema. No pleural effusion or pneumothorax. IMPRESSION: No evidence of acute cardiopulmonary disease. Electronically Signed   By: Orvan Falconer M.D.   On: 12/15/2022 12:52    Labs:  CBC: Recent Labs    12/26/22 1131 12/28/22 1002 12/31/22 0301 01/04/23 0316  WBC 21.2* 27.4* 16.2* 10.2  HGB 12.8* 13.2 10.7* 11.0*  HCT 39.0 40.2 33.6* 35.2*  PLT 339 349 308 391    COAGS: Recent Labs    11/10/22 2018 12/15/22 1154  INR 1.2 1.1  APTT  --  36    BMP: Recent Labs    12/24/22 0215  12/31/22 0301 01/01/23 0131 01/04/23 0316  NA 137 136 138 135  K 3.9 3.3* 3.7 4.2  CL 97* 98 98 96*  CO2 33* 29 32 30  GLUCOSE 96 171* 146* 126*  BUN 28* 34* 24* 23  CALCIUM 8.6* 8.2* 8.3* 8.5*  CREATININE 0.85 1.06 0.91 0.90  GFRNONAA >60 >60 >60 >60    LIVER FUNCTION TESTS: Recent Labs    12/16/22 0533 12/24/22 0215 01/01/23 0128 01/04/23 0316  BILITOT 1.3* 1.5* 0.7 0.8  AST 78* 117* 257* 62*  ALT 40 212* 260* 152*  ALKPHOS 58 92 176* 117  PROT 5.3* 5.9* 6.2* 7.3  ALBUMIN 1.9* 2.1* 1.6* 1.8*    TUMOR MARKERS: No results for input(s): "AFPTM", "CEA", "CA199", "CHROMGRNA" in the last 8760 hours.  Assessment and Plan: Dysarthria, dysphagia Patient with trouble speaking, chewing, swallowing s/p brain injury.  He is currently NGT dependent.    IR consulted for percutaneous gastrostomy tube placement.  Case reviewed and approved by Dr. Grace Isaac.  Although initial concerns noted for infection, patient WBC and fever curve are downtrending, now WNL, after several days of daptomycin per ID.  RN with concerns about patient's capacity and has reached out to PCP to verify emergency contacts vs. Family although none have been found.  PA to bedside this afternoon.  Patient able to give his name, birthday, and location [facility]).  Discussed the feeding tube being requested by the team and asked patient to reiterate his understanding of the procedure.   Patient states "I accept the tube for food."  We discussed risks of the procedure and when asked patient repeats "it can cause an injury."  Ongoing discussion to reinforce nature of procedure being requested and it's role in maintaining patient's rehablitation.  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  Overall, he appears to demonstrate capacity to make decisions and is able to give me examples of procedures he would accept or refuse indicating he has the ability to  discern between procedures, risk, and benefits.  Of note, he is Full Code which appears appropriate based on our conversation today.  Will plan to reassess prior to setting date/time of anticipated procedure.   Risks and benefits image guided gastrostomy tube placement was discussed with the patient including, but not limited to the need for a barium enema during the procedure, bleeding, infection, peritonitis and/or damage to adjacent structures.  All of the patient's questions were answered, patient is agreeable to proceed.  Consent signed and in chart.  Thank you for this interesting consult.  I greatly enjoyed meeting [name] and look forward to participating in their care.  A copy of this report was sent to the requesting provider on this date.  Electronically Signed: Hoyt Koch, PA 01/04/2023, 4:59 PM   I spent a total of 40 Minutes    in face to face in clinical consultation, greater than 50% of which was counseling/coordinating care for dysphagia.

## 2023-01-04 NOTE — Telephone Encounter (Signed)
Chip Boer Nurse Practitioner with [facility] is calling to see if there is an emergency contact person for the pt. Pt is needing a feeding tube due to him not passing the swallowing test. Chip Boer states that they have tried to reach out to a friend Debadamus Mascasso for a week and that person will not answer the phone nor call them back. Pt is needing this feeding tube for  his next procedure. Pt is alert and orientated but cannot tell you the dame nor the time and is not  at medical capacity. Please advise.

## 2023-01-07 LAB — COMPREHENSIVE METABOLIC PANEL
ALT: 100 U/L — ABNORMAL HIGH (ref 0–44)
AST: 47 U/L — ABNORMAL HIGH (ref 15–41)
Albumin: 2.3 g/dL — ABNORMAL LOW (ref 3.5–5.0)
Alkaline Phosphatase: 122 U/L (ref 38–126)
Anion gap: 13 (ref 5–15)
BUN: 19 mg/dL (ref 8–23)
CO2: 26 mmol/L (ref 22–32)
Calcium: 9.1 mg/dL (ref 8.9–10.3)
Chloride: 93 mmol/L — ABNORMAL LOW (ref 98–111)
Creatinine, Ser: 0.93 mg/dL (ref 0.61–1.24)
GFR, Estimated: >60 mL/min (ref >60)
Glucose, Bld: 93 mg/dL (ref 70–99)
Potassium: 4 mmol/L (ref 3.5–5.1)
Sodium: 132 mmol/L — ABNORMAL LOW (ref 135–145)
Total Bilirubin: 0.9 mg/dL (ref 0.3–1.2)
Total Protein: 7.2 g/dL (ref 6.5–8.1)

## 2023-01-07 LAB — CBC
HCT: 40.3 % (ref 39.0–52.0)
Hemoglobin: 12.7 g/dL — ABNORMAL LOW (ref 13.0–17.0)
MCH: 28.7 pg (ref 26.0–34.0)
MCHC: 31.5 g/dL (ref 30.0–36.0)
MCV: 91.2 fL (ref 80.0–100.0)
Platelets: 374 10*3/uL (ref 150–400)
RBC: 4.42 MIL/uL (ref 4.22–5.81)
RDW: 16.4 % — ABNORMAL HIGH (ref 11.5–15.5)
WBC: 10.7 10*3/uL — ABNORMAL HIGH (ref 4.0–10.5)
nRBC: 0 % (ref 0.0–0.2)

## 2023-01-13 ENCOUNTER — Inpatient Hospital Stay: Payer: Medicare Other | Admitting: Internal Medicine

## 2023-01-14 LAB — CBC
HCT: 37 % — ABNORMAL LOW (ref 39.0–52.0)
Hemoglobin: 11.8 g/dL — ABNORMAL LOW (ref 13.0–17.0)
MCH: 27.9 pg (ref 26.0–34.0)
MCHC: 31.9 g/dL (ref 30.0–36.0)
MCV: 87.5 fL (ref 80.0–100.0)
Platelets: 309 10*3/uL (ref 150–400)
RBC: 4.23 MIL/uL (ref 4.22–5.81)
RDW: 17.5 % — ABNORMAL HIGH (ref 11.5–15.5)
WBC: 7.9 10*3/uL (ref 4.0–10.5)
nRBC: 0.3 % — ABNORMAL HIGH (ref 0.0–0.2)

## 2023-01-14 LAB — COMPREHENSIVE METABOLIC PANEL
ALT: 40 U/L (ref 0–44)
AST: 27 U/L (ref 15–41)
Albumin: 2.4 g/dL — ABNORMAL LOW (ref 3.5–5.0)
Alkaline Phosphatase: 91 U/L (ref 38–126)
Anion gap: 10 (ref 5–15)
BUN: 17 mg/dL (ref 8–23)
CO2: 28 mmol/L (ref 22–32)
Calcium: 8.9 mg/dL (ref 8.9–10.3)
Chloride: 95 mmol/L — ABNORMAL LOW (ref 98–111)
Creatinine, Ser: 0.82 mg/dL (ref 0.61–1.24)
GFR, Estimated: >60 mL/min (ref >60)
Glucose, Bld: 92 mg/dL (ref 70–99)
Potassium: 3.5 mmol/L (ref 3.5–5.1)
Sodium: 133 mmol/L — ABNORMAL LOW (ref 135–145)
Total Bilirubin: 0.9 mg/dL (ref 0.3–1.2)
Total Protein: 6.5 g/dL (ref 6.5–8.1)

## 2023-01-14 LAB — MAGNESIUM: Magnesium: 2 mg/dL (ref 1.7–2.4)

## 2023-01-15 ENCOUNTER — Ambulatory Visit: Payer: Medicare Other | Attending: Nurse Practitioner

## 2023-06-07 ENCOUNTER — Other Ambulatory Visit: Payer: Self-pay | Admitting: Urology

## 2023-06-14 NOTE — Progress Notes (Addendum)
For Anesthesia: PCP - Dr. Clarene Duke Cardiologist - Christell Constant, MD Lewayne Bunting, MD   Chest x-ray - 12/28/22 in [facility] EKG - 12/15/22 in [facility] Stress Test - N/A ECHO - 12/21/22 in [facility] Cardiac Cath - N/A Pacemaker/ICD device last checked:N/A Pacemaker orders received: N/A Device Rep notified: N/A  Spinal Cord Stimulator: N/A  Sleep Study - N/A CPAP - N/A  Fasting Blood Sugar - N/A Checks Blood Sugar __N/A___ times a day Date and result of last Hgb A1c-N/A   Blood Thinner Instructions: N/A Aspirin Instructions: N/A Last Dose: N/A  Activity level: Can go up a flight of stairs and activities of daily living without stopping and without chest pain and/or shortness of breath  Anesthesia review: difficult intubation  Patient denies shortness of breath, fever, cough and chest pain during pre op phone call.   Patient verbalized understanding of instructions reviewed via telephone.

## 2023-06-18 ENCOUNTER — Encounter (HOSPITAL_COMMUNITY): Payer: Self-pay | Admitting: Urology

## 2023-06-21 NOTE — Progress Notes (Addendum)
Called and left a voicemail for [place] at [facility] requesting MAR and transportation information as well as hight and weight.

## 2023-06-21 NOTE — Progress Notes (Signed)
Called and spoke with [facility] requesting MAR, transportation information as well as hight and weight.

## 2023-06-22 NOTE — Patient Instructions (Addendum)
Preop instructions for: [name]     Date of Birth: [birth year]                      Date of Procedure:   Thursday, Nov. 21, 2024 Procedure: TRANSURETHRAL RESECTION OF THE PROSTATE (TURP)      Surgeon: Dr. Vilma Prader Facility contact: [facility]    Phone: [phone]                Health Care POA: RN contact name/phone#:  [name]                        and Fax #: [phone]   Transportation contact phone#: [name] [phone]  Please send day of procedure:  Current med list  Medications taken the day of procedure (return attached form to hospital) confirm time of nothing by mouth status (return attached form to hospital) Patient Demographic info( to include DNR status, problem list, allergies) Bring Insurance card and picture ID    Time to arrive at [facility]:  9:30 AM   Report to: Admitting (On your left hand side)    Do not eat solid food or drink past midnight the night before your procedure.(To include any tube feedings-must be discontinued)   Take these morning medications only with sips of water.(or give through gastrostomy or feeding tube).  Buspirone Digoxin Ezetimbe Finasteride Metoprolol Midrodrine  Stop all vitamins and herbal supplements 7 days before surgery.   Note: No Insulin or Diabetic meds should be given or taken the morning of the procedure!  Oral Hygiene is also important to reduce your risk of infection.                                    Remember - BRUSH YOUR TEETH THE MORNING OF SURGERY WITH YOUR REGULAR TOOTHPASTE   DENTURES WILL BE REMOVED PRIOR TO SURGERY PLEASE DO NOT APPLY "Poly grip" OR ADHESIVES!!!  Leave all jewelry and other valuables at place where living( no metal or rings to be worn) No contact lens  Men-no colognes,lotions   Any questions day of procedure,call  SHORT STAY-[phone]     Sent from :[facility]                    [phone]                   Fax:[phone]   Sent by :  Adalid Beckmann BSN,  RN

## 2023-06-22 NOTE — Progress Notes (Signed)
[name] called to confirm receipt of pre op instructions.
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
## 2023-06-23 NOTE — Anesthesia Preprocedure Evaluation (Signed)
Anesthesia Evaluation  Patient identified by MRN, date of birth, ID band Patient awake    Reviewed: Allergy & Precautions, NPO status , Patient's Chart, lab work & pertinent test results  History of Anesthesia Complications (+) DIFFICULT AIRWAY and history of anesthetic complications (Glidescope used successfully in past)  Airway Mallampati: II  TM Distance: >3 FB Neck ROM: Full    Dental  (+) Dental Advisory Given, Missing, Loose, Poor Dentition,    Pulmonary Current Smoker and Patient abstained from smoking.   Pulmonary exam normal breath sounds clear to auscultation       Cardiovascular hypertension, Pt. on medications and Pt. on home beta blockers Normal cardiovascular exam Rhythm:Regular Rate:Normal  TEE 12/2022  1. No vegetations.   2. Left ventricular ejection fraction, by estimation, is 60 to 65%. The left ventricle has normal function. The left ventricle has no regional wall motion abnormalities.   3. Right ventricular systolic function is normal. The right ventricular  size is normal.   4. No left atrial/left atrial appendage thrombus was detected.   5. The mitral valve is normal in structure. Trivial mitral valve regurgitation. No evidence of mitral stenosis.   6. The aortic valve is normal in structure. Aortic valve regurgitation is trivial. No aortic stenosis is present.   7. There is Moderate (Grade III) plaque involving the descending aorta.    TTE 12/2022  1. Left ventricular ejection fraction, by estimation, is 55 to 60%. The left ventricle has normal function. The left ventricle has no regional wall motion abnormalities. Left ventricular diastolic parameters are indeterminate.   2. Right ventricular systolic function is normal. The right ventricular size is severely enlarged. Tricuspid regurgitation signal is inadequate for assessing PA pressure.   3. The mitral valve is normal in structure. No evidence of mitral  valve regurgitation.   4. The aortic valve was not well visualized. Aortic valve regurgitation is not visualized.   5. The inferior vena cava is dilated in size with <50% respiratory variability, suggesting right atrial pressure of 15 mmHg.   Comparison(s): Similar to prior; this study is more technically difficult.     Neuro/Psych  PSYCHIATRIC DISORDERS Anxiety  Bipolar Disorder Schizophrenia     GI/Hepatic ,GERD  ,,  Endo/Other    Renal/GU ARFRenal disease     Musculoskeletal   Abdominal   Peds  Hematology  (+) Blood dyscrasia (Hgb 11.2), anemia   Anesthesia Other Findings   Reproductive/Obstetrics                             Anesthesia Physical Anesthesia Plan  ASA: 3  Anesthesia Plan: General   Post-op Pain Management: Minimal or no pain anticipated   Induction:   PONV Risk Score and Plan: 2 and Ondansetron, Dexamethasone and Treatment may vary due to age or medical condition  Airway Management Planned: LMA  Additional Equipment: None  Intra-op Plan:   Post-operative Plan: Extubation in OR  Informed Consent: I have reviewed the patients History and Physical, chart, labs and discussed the procedure including the risks, benefits and alternatives for the proposed anesthesia with the patient or authorized representative who has indicated his/her understanding and acceptance.     Dental advisory given  Plan Discussed with: CRNA  Anesthesia Plan Comments:        Anesthesia Quick Evaluation

## 2023-06-24 ENCOUNTER — Other Ambulatory Visit: Payer: Self-pay

## 2023-06-24 ENCOUNTER — Ambulatory Visit (HOSPITAL_COMMUNITY): Payer: Medicare Other | Admitting: Physician Assistant

## 2023-06-24 ENCOUNTER — Encounter (HOSPITAL_COMMUNITY): Payer: Self-pay | Admitting: Urology

## 2023-06-24 ENCOUNTER — Ambulatory Visit (HOSPITAL_COMMUNITY)
Admission: RE | Admit: 2023-06-24 | Discharge: 2023-06-25 | Payer: Medicare Other | Source: Ambulatory Visit | Attending: Urology | Admitting: Urology

## 2023-06-24 ENCOUNTER — Encounter (HOSPITAL_COMMUNITY)
Admission: RE | Disposition: A | Discharge status: Other Acute Inpatient Hospital | Payer: Self-pay | Source: Ambulatory Visit | Attending: Urology

## 2023-06-24 ENCOUNTER — Ambulatory Visit (HOSPITAL_BASED_OUTPATIENT_CLINIC_OR_DEPARTMENT_OTHER): Payer: Self-pay | Admitting: Physician Assistant

## 2023-06-24 DIAGNOSIS — K219 Gastro-esophageal reflux disease without esophagitis: Secondary | ICD-10-CM | POA: Insufficient documentation

## 2023-06-24 DIAGNOSIS — R339 Retention of urine, unspecified: Secondary | ICD-10-CM

## 2023-06-24 DIAGNOSIS — N400 Enlarged prostate without lower urinary tract symptoms: Secondary | ICD-10-CM | POA: Diagnosis present

## 2023-06-24 DIAGNOSIS — Z79899 Other long term (current) drug therapy: Secondary | ICD-10-CM | POA: Diagnosis not present

## 2023-06-24 DIAGNOSIS — F419 Anxiety disorder, unspecified: Secondary | ICD-10-CM | POA: Diagnosis not present

## 2023-06-24 DIAGNOSIS — N411 Chronic prostatitis: Secondary | ICD-10-CM | POA: Diagnosis not present

## 2023-06-24 DIAGNOSIS — N401 Enlarged prostate with lower urinary tract symptoms: Secondary | ICD-10-CM | POA: Diagnosis present

## 2023-06-24 DIAGNOSIS — N320 Bladder-neck obstruction: Secondary | ICD-10-CM | POA: Diagnosis not present

## 2023-06-24 DIAGNOSIS — I10 Essential (primary) hypertension: Secondary | ICD-10-CM | POA: Diagnosis not present

## 2023-06-24 HISTORY — PX: TRANSURETHRAL RESECTION OF PROSTATE: SHX73

## 2023-06-24 HISTORY — DX: Gastro-esophageal reflux disease without esophagitis: K21.9

## 2023-06-24 HISTORY — DX: Anemia, unspecified: D64.9

## 2023-06-24 HISTORY — DX: Hyperlipidemia, unspecified: E78.5

## 2023-06-24 HISTORY — DX: Benign prostatic hyperplasia without lower urinary tract symptoms: N40.0

## 2023-06-24 HISTORY — DX: Essential (primary) hypertension: I10

## 2023-06-24 LAB — TYPE AND SCREEN
ABO/RH(D): A POS
Antibody Screen: NEGATIVE

## 2023-06-24 SURGERY — TURP (TRANSURETHRAL RESECTION OF PROSTATE)
Anesthesia: General | Site: Prostate

## 2023-06-24 MED ORDER — LACTATED RINGERS IV SOLN
INTRAVENOUS | Status: DC
Start: 2023-06-24 — End: 2023-06-24

## 2023-06-24 MED ORDER — ONDANSETRON HCL 4 MG/2ML IJ SOLN: 4.0000 mg | INTRAMUSCULAR | Status: DC | PRN

## 2023-06-24 MED ORDER — OXYCODONE HCL 5 MG PO TABS: 5.0000 mg | ORAL_TABLET | ORAL | Status: DC | PRN

## 2023-06-24 MED ORDER — DIPHENHYDRAMINE HCL 12.5 MG/5ML PO ELIX: 12.5000 mg | ORAL_SOLUTION | Freq: Four times a day (QID) | ORAL | Status: DC | PRN

## 2023-06-24 MED ORDER — METOPROLOL TARTRATE 12.5 MG HALF TABLET
12.5000 mg | ORAL_TABLET | Freq: Two times a day (BID) | ORAL | Status: DC
  Administered 2023-06-25: 12.5 mg via ORAL
  Filled 2023-06-24 (×2): qty 1

## 2023-06-24 MED ORDER — ORAL CARE MOUTH RINSE
15.0000 mL | Freq: Once | OROMUCOSAL | Status: AC
  Administered 2023-06-24: 15 mL via OROMUCOSAL

## 2023-06-24 MED ORDER — FAMOTIDINE 20 MG PO TABS
20.0000 mg | ORAL_TABLET | Freq: Two times a day (BID) | ORAL | Status: DC
  Administered 2023-06-24 – 2023-06-25 (×2): 20 mg via ORAL
  Filled 2023-06-24 (×2): qty 1

## 2023-06-24 MED ORDER — OLANZAPINE 10 MG PO TABS
20.0000 mg | ORAL_TABLET | Freq: Every day | ORAL | Status: DC
  Administered 2023-06-24: 20 mg
  Filled 2023-06-24: qty 4
  Filled 2023-06-24: qty 2

## 2023-06-24 MED ORDER — VANCOMYCIN HCL IN DEXTROSE 1-5 GM/200ML-% IV SOLN
1000.0000 mg | Freq: Two times a day (BID) | INTRAVENOUS | Status: AC
  Administered 2023-06-24: 1000 mg via INTRAVENOUS
  Filled 2023-06-24: qty 200

## 2023-06-24 MED ORDER — ACETAMINOPHEN 500 MG PO TABS
1000.0000 mg | ORAL_TABLET | Freq: Once | ORAL | Status: AC
  Administered 2023-06-24: 1000 mg via ORAL
  Filled 2023-06-24: qty 2

## 2023-06-24 MED ORDER — DIPHENHYDRAMINE HCL 50 MG/ML IJ SOLN: 12.5000 mg | Freq: Four times a day (QID) | INTRAMUSCULAR | Status: DC | PRN

## 2023-06-24 MED ORDER — SODIUM CHLORIDE 0.9 % IR SOLN
Status: DC | PRN
  Administered 2023-06-24 (×3): 6000 mL

## 2023-06-24 MED ORDER — MELATONIN 3 MG PO TABS: 3.0000 mg | ORAL_TABLET | Freq: Every day | ORAL | Status: DC

## 2023-06-24 MED ORDER — TRIPLE ANTIBIOTIC 3.5-400-5000 EX OINT: 1.0000 | TOPICAL_OINTMENT | Freq: Three times a day (TID) | CUTANEOUS | Status: DC | PRN

## 2023-06-24 MED ORDER — ONDANSETRON HCL 4 MG/2ML IJ SOLN
INTRAMUSCULAR | Status: DC | PRN
  Administered 2023-06-24: 4 mg via INTRAVENOUS

## 2023-06-24 MED ORDER — SENNOSIDES-DOCUSATE SODIUM 8.6-50 MG PO TABS
2.0000 | ORAL_TABLET | Freq: Every day | ORAL | Status: DC
  Administered 2023-06-24: 2 via ORAL
  Filled 2023-06-24: qty 2

## 2023-06-24 MED ORDER — LIDOCAINE 2% (20 MG/ML) 5 ML SYRINGE
INTRAMUSCULAR | Status: DC | PRN
  Administered 2023-06-24: 60 mg via INTRAVENOUS

## 2023-06-24 MED ORDER — FUROSEMIDE 20 MG PO TABS
20.0000 mg | ORAL_TABLET | Freq: Every morning | ORAL | Status: DC
  Administered 2023-06-25: 20 mg via ORAL
  Filled 2023-06-24: qty 1

## 2023-06-24 MED ORDER — PHENYLEPHRINE 80 MCG/ML (10ML) SYRINGE FOR IV PUSH (FOR BLOOD PRESSURE SUPPORT)
PREFILLED_SYRINGE | INTRAVENOUS | Status: AC
  Filled 2023-06-24: qty 10

## 2023-06-24 MED ORDER — OLANZAPINE 10 MG PO TABS: 20.0000 mg | ORAL_TABLET | Freq: Every day | ORAL | Status: DC

## 2023-06-24 MED ORDER — FENTANYL CITRATE (PF) 100 MCG/2ML IJ SOLN
INTRAMUSCULAR | Status: AC
  Filled 2023-06-24: qty 2

## 2023-06-24 MED ORDER — ONDANSETRON HCL 4 MG/2ML IJ SOLN
INTRAMUSCULAR | Status: AC
  Filled 2023-06-24: qty 2

## 2023-06-24 MED ORDER — PROPOFOL 10 MG/ML IV BOLUS
INTRAVENOUS | Status: AC
Start: 2023-06-24 — End: ?
  Filled 2023-06-24: qty 20

## 2023-06-24 MED ORDER — PROPOFOL 10 MG/ML IV BOLUS
INTRAVENOUS | Status: DC | PRN
  Administered 2023-06-24: 200 mg via INTRAVENOUS
  Administered 2023-06-24: 100 mg via INTRAVENOUS

## 2023-06-24 MED ORDER — EPHEDRINE 5 MG/ML INJ
INTRAVENOUS | Status: AC
  Filled 2023-06-24: qty 5

## 2023-06-24 MED ORDER — PHENYLEPHRINE HCL-NACL 20-0.9 MG/250ML-% IV SOLN
INTRAVENOUS | Status: DC | PRN
  Administered 2023-06-24: 40 ug/min via INTRAVENOUS

## 2023-06-24 MED ORDER — FINASTERIDE 5 MG PO TABS
5.0000 mg | ORAL_TABLET | Freq: Every morning | ORAL | Status: DC
  Administered 2023-06-25: 5 mg via ORAL
  Filled 2023-06-24: qty 1

## 2023-06-24 MED ORDER — SODIUM CHLORIDE 0.9 % IR SOLN: 3000.0000 mL | Status: DC

## 2023-06-24 MED ORDER — GENTAMICIN SULFATE 40 MG/ML IJ SOLN
5.0000 mg/kg | INTRAVENOUS | Status: AC
  Administered 2023-06-24: 380 mg via INTRAVENOUS
  Filled 2023-06-24: qty 9.5

## 2023-06-24 MED ORDER — HYOSCYAMINE SULFATE 0.125 MG SL SUBL: 0.1250 mg | SUBLINGUAL_TABLET | SUBLINGUAL | Status: DC | PRN

## 2023-06-24 MED ORDER — CHLORHEXIDINE GLUCONATE 0.12 % MT SOLN
15.0000 mL | Freq: Once | OROMUCOSAL | Status: AC
Start: 2023-06-24 — End: 2023-06-24

## 2023-06-24 MED ORDER — DEXAMETHASONE SODIUM PHOSPHATE 4 MG/ML IJ SOLN
INTRAMUSCULAR | Status: DC | PRN
  Administered 2023-06-24: 5 mg via INTRAVENOUS

## 2023-06-24 MED ORDER — DEXAMETHASONE SODIUM PHOSPHATE 10 MG/ML IJ SOLN
INTRAMUSCULAR | Status: AC
  Filled 2023-06-24: qty 1

## 2023-06-24 MED ORDER — DIGOXIN 125 MCG PO TABS
125.0000 ug | ORAL_TABLET | Freq: Every morning | ORAL | Status: DC
  Administered 2023-06-25: 125 ug via ORAL
  Filled 2023-06-24: qty 1

## 2023-06-24 MED ORDER — DROPERIDOL 2.5 MG/ML IJ SOLN: 0.6250 mg | Freq: Once | INTRAMUSCULAR | Status: DC | PRN

## 2023-06-24 MED ORDER — ACETAMINOPHEN 500 MG PO TABS
1000.0000 mg | ORAL_TABLET | Freq: Four times a day (QID) | ORAL | Status: DC
Start: 2023-06-24 — End: 2023-06-25
  Administered 2023-06-24 – 2023-06-25 (×3): 1000 mg via ORAL
  Filled 2023-06-24 (×3): qty 2

## 2023-06-24 MED ORDER — PHENYLEPHRINE 80 MCG/ML (10ML) SYRINGE FOR IV PUSH (FOR BLOOD PRESSURE SUPPORT)
PREFILLED_SYRINGE | INTRAVENOUS | Status: DC | PRN
  Administered 2023-06-24: 80 ug via INTRAVENOUS
  Administered 2023-06-24 (×3): 120 ug via INTRAVENOUS
  Administered 2023-06-24: 80 ug via INTRAVENOUS
  Administered 2023-06-24 (×3): 120 ug via INTRAVENOUS

## 2023-06-24 MED ORDER — 0.9 % SODIUM CHLORIDE (POUR BTL) OPTIME
TOPICAL | Status: DC | PRN
  Administered 2023-06-24: 1000 mL

## 2023-06-24 MED ORDER — FENTANYL CITRATE (PF) 100 MCG/2ML IJ SOLN
INTRAMUSCULAR | Status: DC | PRN
  Administered 2023-06-24 (×4): 50 ug via INTRAVENOUS

## 2023-06-24 MED ORDER — EPHEDRINE SULFATE-NACL 50-0.9 MG/10ML-% IV SOSY
PREFILLED_SYRINGE | INTRAVENOUS | Status: DC | PRN
  Administered 2023-06-24 (×2): 5 mg via INTRAVENOUS

## 2023-06-24 MED ORDER — LIDOCAINE HCL (PF) 2 % IJ SOLN
INTRAMUSCULAR | Status: AC
Start: 2023-06-24 — End: ?
  Filled 2023-06-24: qty 5

## 2023-06-24 MED ORDER — CHLORHEXIDINE GLUCONATE 0.12 % MT SOLN: 15.0000 mL | Freq: Once | OROMUCOSAL | Status: DC

## 2023-06-24 MED ORDER — POLYETHYLENE GLYCOL 3350 17 G PO PACK
17.0000 g | PACK | Freq: Every day | ORAL | Status: DC | PRN
Start: 2023-06-24 — End: 2023-06-25

## 2023-06-24 MED ORDER — MIDODRINE HCL 5 MG PO TABS
5.0000 mg | ORAL_TABLET | Freq: Three times a day (TID) | ORAL | Status: DC
  Administered 2023-06-24 – 2023-06-25 (×2): 5 mg via ORAL
  Filled 2023-06-24 (×2): qty 1

## 2023-06-24 MED ORDER — VANCOMYCIN HCL IN DEXTROSE 1-5 GM/200ML-% IV SOLN
1000.0000 mg | Freq: Once | INTRAVENOUS | Status: AC
  Administered 2023-06-24: 1000 mg via INTRAVENOUS
  Filled 2023-06-24: qty 200

## 2023-06-24 MED ORDER — MELATONIN 3 MG PO TABS
3.0000 mg | ORAL_TABLET | Freq: Every day | ORAL | Status: DC
  Administered 2023-06-24: 3 mg
  Filled 2023-06-24: qty 1

## 2023-06-24 MED ORDER — BUSPIRONE HCL 5 MG PO TABS
7.5000 mg | ORAL_TABLET | Freq: Two times a day (BID) | ORAL | Status: DC
  Administered 2023-06-24 – 2023-06-25 (×2): 7.5 mg via ORAL
  Filled 2023-06-24 (×2): qty 2

## 2023-06-24 MED ORDER — METHOCARBAMOL 500 MG PO TABS
1000.0000 mg | ORAL_TABLET | Freq: Four times a day (QID) | ORAL | Status: DC
  Administered 2023-06-24 – 2023-06-25 (×3): 1000 mg via ORAL
  Filled 2023-06-24 (×3): qty 2

## 2023-06-24 SURGICAL SUPPLY — 20 items
BAG URINE DRAIN 2000ML AR STRL (UROLOGICAL SUPPLIES) ×2 IMPLANT
BAG URO CATCHER STRL LF (MISCELLANEOUS) ×2 IMPLANT
CATH FOLEY 3WAY 30CC 22FR (CATHETERS) IMPLANT
CATH URTH STD 24FR FL 3W 2 (CATHETERS) IMPLANT
DRAPE FOOT SWITCH (DRAPES) ×2 IMPLANT
ELECT REM PT RETURN 15FT ADLT (MISCELLANEOUS) ×2 IMPLANT
EVACUATOR MICROVAS BLADDER (UROLOGICAL SUPPLIES) IMPLANT
GLOVE BIO SURGEON STRL SZ7.5 (GLOVE) ×2 IMPLANT
GOWN STRL REUS W/ TWL XL LVL3 (GOWN DISPOSABLE) ×2 IMPLANT
HOLDER FOLEY CATH W/STRAP (MISCELLANEOUS) IMPLANT
KIT TURNOVER KIT A (KITS) IMPLANT
LOOP CUT BIPOLAR 24F LRG (ELECTROSURGICAL) IMPLANT
MANIFOLD NEPTUNE II (INSTRUMENTS) ×2 IMPLANT
PACK CYSTO (CUSTOM PROCEDURE TRAY) ×2 IMPLANT
PAD PREP 24X48 CUFFED NSTRL (MISCELLANEOUS) ×2 IMPLANT
SYR 30ML LL (SYRINGE) IMPLANT
SYR TOOMEY IRRIG 70ML (MISCELLANEOUS) ×1
SYRINGE TOOMEY IRRIG 70ML (MISCELLANEOUS) ×2 IMPLANT
TUBING CONNECTING 10 (TUBING) ×2 IMPLANT
TUBING UROLOGY SET (TUBING) ×2 IMPLANT

## 2023-06-24 NOTE — Transfer of Care (Signed)
Immediate Anesthesia Transfer of Care Note  Patient: [name]  Procedure(s) Performed: TRANSURETHRAL RESECTION OF THE PROSTATE (TURP) (Prostate)  Patient Location: PACU  Anesthesia Type:General  Level of Consciousness: drowsy  Airway & Oxygen Therapy: Patient Spontanous Breathing and Patient connected to face mask  Post-op Assessment: Report given to RN and Post -op Vital signs reviewed and stable  Post vital signs: Reviewed and stable  Last Vitals:  Vitals Value Taken Time  BP 95/51 06/24/23 1449  Temp    Pulse 81 06/24/23 1450  Resp 13 06/24/23 1450  SpO2 100 % 06/24/23 1450  Vitals shown include unfiled device data.  Last Pain:  Vitals:   06/24/23 0910  TempSrc:   PainSc: 3       Patients Stated Pain Goal: 2 (06/24/23 0910)  Complications: No notable events documented.

## 2023-06-24 NOTE — Progress Notes (Signed)
Sacral pad applied to left buttock.1025.

## 2023-06-24 NOTE — Op Note (Addendum)
Preoperative diagnosis:  Bladder outlet obstruction Urinary retention  Postoperative diagnosis:  same   Procedure:  Transurethral resection of prostate  Surgeon: Sherle Poe, MD   Anesthesia: General   Complications: None   Drains: 22Fr 3 way catheter with 30cc in the balloon.   Intraoperative findings: Cystoscopy demonstrated   a normal appearing bladder mucosa without tumor/stones/or abnormalities.  UOs were orthopic.  Prostate demonstrated a large median lobe with bilateral lateral lobe obstruction.  EBL: 100cc   Specimens: Prostate chips  Indication: [name] is a 72 y.o. patient with bladder outlet obstruction causing urinary retention. UDS demonstrated that the patient would be able to urinate and was obstructed. .  After reviewing the management options for treatment, he elected to proceed with the above surgical procedure(s). We have discussed the potential benefits and risks of the procedure, side effects of the proposed treatment, the likelihood of the patient achieving the goals of the procedure, and any potential problems that might occur during the procedure or recuperation. Informed consent has been obtained.  Description of procedure:  The patient was taken to the operating room and general anesthesia was induced. The patient was placed in the dorsal lithotomy position, prepped and draped in the usual sterile fashion, and preoperative antibiotics were administered. A preoperative time-out was performed.   The 26 french resectoscope with the visual obturator was then passed through the urethra into the bladder. Pan cystoscopy was performed findings as noted above.  An exchange the obturator for the resectoscope itself and the loop cautery. The median lobe was taken down first so that the resection bed was flush with the bladder floor. Attention was then turned to the patient's left lateral lobe. The was resected to the level of the capsule and distally to the area  just proximal to the verumontanum. Attention was transitioned to the Left lateral lobe and was resected in the same fashion as the right. The prostatic floor was then resected to the level of the capsule and distally to just proximal to the verumontanum. The roof of the prostate was then resected.   Once I was satisfied that the prostate had been adequately resected I evacuated the prostate chips using a Toomey syringe. I then reintroduced the resectoscope and ensured adequate hemostasis. I then left the bladder full and removed the resectoscope entirely. Exam under anesthesia demonstrated a normal sized prostate with no nodules.  I then placed a 22 French three-way Foley catheter. I irrigated the catheter gently and removed all debris and clots. I then placed the patient on gentle continuous bladder irrigation.  He subsequently extubated and returned to PACU in excellent condition.  Vilma Prader MD

## 2023-06-24 NOTE — Progress Notes (Signed)
MD assessed sacral decubitus as noted in the flow sheet.

## 2023-06-24 NOTE — Anesthesia Procedure Notes (Signed)
Procedure Name: LMA Insertion Date/Time: 06/24/2023 1:30 PM  Performed by: Vanessa [place], CRNAPre-anesthesia Checklist: Emergency Drugs available, Patient identified, Suction available and Patient being monitored Patient Re-evaluated:Patient Re-evaluated prior to induction Oxygen Delivery Method: Circle system utilized Preoxygenation: Pre-oxygenation with 100% oxygen Induction Type: IV induction Ventilation: Mask ventilation without difficulty LMA: LMA inserted LMA Size: 4.0 Number of attempts: 1 Placement Confirmation: positive ETCO2 and breath sounds checked- equal and bilateral Tube secured with: Tape Dental Injury: Teeth and Oropharynx as per pre-operative assessment

## 2023-06-25 ENCOUNTER — Ambulatory Visit: Payer: Medicare Other | Admitting: Podiatry

## 2023-06-25 ENCOUNTER — Encounter (HOSPITAL_COMMUNITY): Payer: Self-pay | Admitting: Urology

## 2023-06-25 DIAGNOSIS — N401 Enlarged prostate with lower urinary tract symptoms: Secondary | ICD-10-CM | POA: Diagnosis not present

## 2023-06-25 LAB — SURGICAL PATHOLOGY

## 2023-06-25 MED ORDER — POLYETHYLENE GLYCOL 3350 17 G PO PACK: 17.0000 g | PACK | Freq: Every day | ORAL | 0 refills | Status: AC | PRN

## 2023-06-25 MED ORDER — METHOCARBAMOL 1000 MG PO TABS: 1000.0000 mg | ORAL_TABLET | Freq: Three times a day (TID) | ORAL | 0 refills | Status: AC | PRN

## 2023-06-25 MED ORDER — CHLORHEXIDINE GLUCONATE CLOTH 2 % EX PADS
6.0000 | MEDICATED_PAD | Freq: Every day | CUTANEOUS | Status: DC
  Administered 2023-06-25: 6 via TOPICAL

## 2023-06-25 NOTE — Plan of Care (Signed)
°  Problem: Nutrition: °Goal: Adequate nutrition will be maintained °Outcome: Progressing °  °Problem: Coping: °Goal: Level of anxiety will decrease °Outcome: Progressing °  °Problem: Safety: °Goal: Ability to remain free from injury will improve °Outcome: Progressing °  °
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
## 2023-06-25 NOTE — Discharge Instructions (Addendum)
Transurethral Resection of the Prostate (TURP)   Care After  Refer to this sheet in the next few weeks. These discharge instructions provide you with general information on caring for yourself after you leave the hospital. Your caregiver may also give you specific instructions. Your treatment has been planned according to the most current medical practices available, but unavoidable complications sometimes occur. If you have any problems or questions after discharge, please call your caregiver.  HOME CARE INSTRUCTIONS   Medications You may receive medicine for pain management. As your level of discomfort decreases, adjustments in your pain medicines may be made.  Take all medicines as directed.  You may be given a medicine (antibiotic) to kill germs following surgery. Finish all medicines. Let your caregiver know if you have any side effects or problems from the medicine.  If you are on aspirin, it would be best not to restart the aspirin until the blood in the urine clears You may take tylenol for pain management and after 2 days ok to start taking ibuprofen or other NSAIDs Continue to to take the finasteride. The Tamsulosin can be stopped.  Hygiene You can take a shower after surgery.  You should not take a bath while you still have the urethral catheter. Activity You will be encouraged to get out of bed as much as possible and increase your activity level as tolerated.  Spend the first week in and around your home. For 3 weeks, avoid the following:  Straining.  Running.  Strenuous work.  Walks longer than a few blocks.  Riding for extended periods.  Sexual relations.  Do not lift heavy objects (more than 10 pounds) for at least 1 month. When lifting, use your arms instead of your abdominal muscles.  You will be encouraged to walk as tolerated. Do not exert yourself. Increase your activity level slowly. Remember that it is important to keep moving after an operation of any type. This  cuts down on the possibility of developing blood clots.  Your caregiver will tell you when you can resume driving and light housework. Discuss this at your first office visit after discharge. Diet No special diet is ordered after a TURP. However, if you are on a special diet for another medical problem, it should be continued.  Normal fluid intake is usually recommended.  Avoid alcohol and caffeinated drinks for 2 weeks. They irritate the bladder. Decaffeinated drinks are okay.  Avoid spicy foods.  Bladder Function For the first 3 weeks, empty the bladder whenever you feel a definite desire. Do not try to hold the urine for long periods of time. Urgency with urination can be normal after this surgery. Urinating once or twice a night even after you are healed is not uncommon.  You may see some recurrence of blood in the urine after discharge from the hospital. This usually happens within 2 weeks after the procedure.If this occurs, force fluids again as you did in the hospital and reduce your activity.  Bowel Function You may experience some constipation after surgery. This can be minimized by increasing fluids and fiber in your diet. Drink enough water and fluids to keep your urine clear or pale yellow.  A stool softener may be prescribed for use at home. Do not strain to move your bowels.  If you are requiring increased pain medicine, it is important that you take stool softeners to prevent constipation. This will help to promote proper healing by reducing the need to strain to move your bowels.  Sexual Activity Semen movement in the opposite direction and into the bladder (retrograde ejaculation) may occur. Since the semen passes into the bladder, cloudy urine can occur the first time you urinate after intercourse. Or, you may not have an ejaculation during erection. Ask your caregiver when you can resume sexual activity. Retrograde ejaculation and reduced semen discharge should not reduce one's  pleasure of intercourse.  Postoperative Visit Arrange the date and time of your after surgery visit with your caregiver.  Return to Work After your recovery is complete, you will be able to return to work and resume all activities. Your caregiver will inform you when you can return to work.    Foley Catheter Care A soft, flexible tube (Foley catheter) may have been placed in your bladder to drain urine and fluid. Follow these instructions: Taking Care of the Catheter Keep the area where the catheter leaves your body clean.  Attach the catheter to the leg so there is no tension on the catheter.  Keep the drainage bag below the level of the bladder, but keep it OFF the floor.  Do not take long soaking baths. Your caregiver will give instructions about showering.  Wash your hands before touching ANYTHING related to the catheter or bag.  Using mild soap and warm water on a washcloth:  Clean the area closest to the catheter insertion site using a circular motion around the catheter.  Clean the catheter itself by wiping AWAY from the insertion site for several inches down the tube.  NEVER wipe upward as this could sweep bacteria up into the urethra (tube in your body that normally drains the bladder) and cause infection.  Place a small amount of sterile lubricant at the tip of the penis where the catheter is entering.  Taking Care of the Drainage Bags Two drainage bags may be taken home: a large overnight drainage bag, and a smaller leg bag which fits underneath clothing.  It is okay to wear the overnight bag at any time, but NEVER wear the smaller leg bag at night.  Keep the drainage bag well below the level of your bladder. This prevents backflow of urine into the bladder and allows the urine to drain freely.  Anchor the tubing to your leg to prevent pulling or tension on the catheter. Use tape or a leg strap provided by the hospital.  Empty the drainage bag when it is 1/2 to 3/4 full. Wash your  hands before and after touching the bag.  Periodically check the tubing for kinks to make sure there is no pressure on the tubing which could restrict the flow of urine.  Changing the Drainage Bags Cleanse both ends of the clean bag with alcohol before changing.  Pinch off the rubber catheter to avoid urine spillage during the disconnection.  Disconnect the dirty bag and connect the clean one.  Empty the dirty bag carefully to avoid a urine spill.  Attach the new bag to the leg with tape or a leg strap.  Cleaning the Drainage Bags Whenever a drainage bag is disconnected, it must be cleaned quickly so it is ready for the next use.  Wash the bag in warm, soapy water.  Rinse the bag thoroughly with warm water.  Soak the bag for 30 minutes in a solution of white vinegar and water (1 cup vinegar to 1 quart warm water).  Rinse with warm water.  SEEK MEDICAL CARE IF:  You have chills or night sweats.  You are leaking around  your catheter or have problems with your catheter. It is not uncommon to have sporadic leakage around your catheter as a result of bladder spasms. If the leakage stops, there is not much need for concern. If you are uncertain, call your caregiver.  You develop side effects that you think are coming from your medicines.  SEEK IMMEDIATE MEDICAL CARE IF:  You are suddenly unable to urinate. Check to see if there are any kinks in the drainage tubing that may cause this. If you cannot find any kinks, call your caregiver immediately. This is an emergency.  You develop shortness of breath or chest pains.  Bleeding persists or clots develop in your urine.  You have a fever > 100.4.  You develop pain in your back or over your lower belly (abdomen).  You develop pain or swelling in your legs.  Any problems you are having get worse rather than better.  MAKE SURE YOU:  Understand these instructions.  Will watch your condition.  Will get help right away if you are not doing well or get  worse.

## 2023-06-25 NOTE — Anesthesia Postprocedure Evaluation (Signed)
Anesthesia Post Note  Patient: [name]  Procedure(s) Performed: TRANSURETHRAL RESECTION OF THE PROSTATE (TURP) (Prostate)     Patient location during evaluation: PACU Anesthesia Type: General Level of consciousness: sedated and patient cooperative Pain management: pain level controlled Vital Signs Assessment: post-procedure vital signs reviewed and stable Respiratory status: spontaneous breathing Cardiovascular status: stable Anesthetic complications: no   No notable events documented.  Last Vitals:  Vitals:   06/25/23 0115 06/25/23 0516  BP: (!) 94/51 (!) 102/53  Pulse: 85 79  Resp: 16 16  Temp: 37.3 C 36.4 C  SpO2: 96% 98%    Last Pain:  Vitals:   06/25/23 0516  TempSrc: Oral  PainSc:                  Lewie Loron

## 2023-06-25 NOTE — Progress Notes (Signed)
Report given to Selena Batten at [facility].

## 2023-06-25 NOTE — TOC Transition Note (Addendum)
Transition of Care [facility]) - CM/SW Discharge Note   Patient Details  Name: [name] MRN: [id number] Date of Birth: 12-Aug-1950  Transition of Care [facility]) CM/SW Contact:  Howell Rucks, RN Phone Number: 06/25/2023, 9:30 AM   Clinical Narrative:  Call to Wille Celeste, admissions coordinator, [facility], confirmed pt LTC, able to return to facility today. Nurse Report,   [phone], press 0, ask for nurse to give report. Team notified.   -12:01pm [facility] call for transport. No further TOC needs identified.     Final next level of care: Long Term Nursing Home Barriers to Discharge: Barriers Resolved   Patient Goals and CMS Choice      Discharge Placement                  Patient to be transferred to facility by: [facility]   Patient and family notified of of transfer: 06/25/23  Discharge Plan and Services Additional resources added to the After Visit Summary for                                       Social Determinants of Health (SDOH) Interventions SDOH Screenings   Food Insecurity: No Food Insecurity (06/24/2023)  Housing: Low Risk  (06/24/2023)  Transportation Needs: No Transportation Needs (06/24/2023)  Utilities: Not At Risk (06/24/2023)  Alcohol Screen: Low Risk  (03/20/2022)  Depression (PHQ2-9): Low Risk  (08/27/2022)  Recent Concern: Depression (PHQ2-9) - Medium Risk (06/01/2022)  Financial Resource Strain: High Risk (12/25/2022)   Received from [facility]  Physical Activity: Sufficiently Active (03/20/2022)  Social Connections: Socially Isolated (12/25/2022)   Received from [facility]  Stress: No Stress Concern Present (01/15/2023)   Received from [facility]  Tobacco Use: High Risk (06/24/2023)     Readmission Risk Interventions     No data to display

## 2023-07-05 ENCOUNTER — Encounter: Payer: Self-pay | Admitting: Podiatry

## 2023-07-05 ENCOUNTER — Ambulatory Visit (INDEPENDENT_AMBULATORY_CARE_PROVIDER_SITE_OTHER): Payer: Medicare Other | Admitting: Podiatry

## 2023-07-05 VITALS — Ht 72.0 in | Wt 169.1 lb

## 2023-07-05 DIAGNOSIS — M79675 Pain in left toe(s): Secondary | ICD-10-CM

## 2023-07-05 DIAGNOSIS — M79674 Pain in right toe(s): Secondary | ICD-10-CM | POA: Diagnosis not present

## 2023-07-05 DIAGNOSIS — L853 Xerosis cutis: Secondary | ICD-10-CM | POA: Diagnosis not present

## 2023-07-05 DIAGNOSIS — B351 Tinea unguium: Secondary | ICD-10-CM | POA: Diagnosis not present

## 2023-07-05 NOTE — Progress Notes (Signed)
Subjective:  Patient ID: [name], male    DOB: 08-07-1950,  MRN: [id number]  [name] presents to clinic today for:  Chief Complaint  Patient presents with   Nail Problem    RFC- NP pt has thick long toenails on bilateral foot   Patient notes nails are thick, discolored, elongated and painful in shoegear when trying to ambulate.  Patient resides in a facility and does have his paperwork to be completed and reviewed today.  States that he has difficulty trimming his own nails.  He notes that the right toenail on the great toe was getting thick, so he ripped some of it off to get relief.  He does not have any pain.  PCP is Claiborne Rigg, NP.  Past Medical History:  Diagnosis Date   Acute urinary retention    Anemia    Anxiety    BPH (benign prostatic hyperplasia)    Brain injury (HCC) 1975   Complication of anesthesia 08/29/2013   difficulty due to large tongue, anterior larynx and limited opening   Difficult intubation 08/31/2013   GERD (gastroesophageal reflux disease)    Head injury 1975   "Brain Stem Contusion"   Hyperlipidemia    Hypertension    Manic depression (HCC)    Pelvic fracture (HCC) 09/21/2013   Inferior Pubic Ramus, Left Superior Ramus   Schizo affective schizophrenia (HCC)    Schizophrenia (HCC)     Past Surgical History:  Procedure Laterality Date   COSMETIC SURGERY Left 1975   created new ear lobe   DRESSING CHANGE UNDER ANESTHESIA Left 08/31/2013   Procedure: DRESSING CHANGE UNDER ANESTHESIA for right ankle and left thigh;  Surgeon: Budd Palmer, MD;  Location: [facility] OR;  Service: Orthopedics;  Laterality: Left;   I & D EXTREMITY Bilateral 08/30/2013   Procedure: IRRIGATION AND DEBRIDEMENT with closure Left Thigh wound, Irrigation and debridement Right Ankle ;  Surgeon: Harvie Junior, MD;  Location: [facility] OR;  Service: Orthopedics;  Laterality: Bilateral;   I & D EXTREMITY Left 09/26/2013   Procedure: LEFT IRRIGATION AND DEBRIDEMENT  Robby Sermon;  Surgeon: Budd Palmer, MD;  Location: [facility] OR;  Service: Orthopedics;  Laterality: Left;   I & D EXTREMITY Left 10/02/2013   Procedure: IRRIGATION AND DEBRIDEMENT EXTREMITY;  Surgeon: Budd Palmer, MD;  Location: [facility] OR;  Service: Orthopedics;  Laterality: Left;   INCISION AND DRAINAGE OF WOUND Left 09/21/2013   Procedure: IRRIGATION AND DEBRIDEMENT SOFT TISSUE WOUND WITH LARGE WOUND VAC PLACEMENT;  Surgeon: Budd Palmer, MD;  Location: [facility] OR;  Service: Orthopedics;  Laterality: Left;   MANDIBLE FRACTURE SURGERY  1975   SACRO-ILIAC PINNING Left 08/31/2013   Procedure: LEFT SACRO-ILIAC PINNING;  Surgeon: Budd Palmer, MD;  Location[facility] OR;  Service: Orthopedics;  Laterality: Left;   SKIN GRAFT     SKIN GRAFT Left 10/31/2013   left thigh      DR HANDY    SKIN SPLIT GRAFT Left 10/02/2013   Procedure: SKIN GRAFT SPLIT THICKNESS;  Surgeon: Budd Palmer, MD;  Location[facility];  Service: Orthopedics;  Laterality: Left;   SKIN SPLIT GRAFT Left 10/31/2013   Procedure: LEFT THIGH SKIN GRAFT SPLIT THICKNESS;  Surgeon: Budd Palmer, MD;  Location: [facility] OR;  Service: Orthopedics;  Laterality: Left;   TEE WITHOUT CARDIOVERSION N/A 12/21/2022   Procedure: TRANSESOPHAGEAL ECHOCARDIOGRAM;  Surgeon: Lewayne Bunting, MD;  Location: [facility] INVASIVE CV LAB;  Service: Cardiovascular;  Laterality: N/A;   TRANSURETHRAL RESECTION OF PROSTATE N/A 06/24/2023   Procedure: TRANSURETHRAL RESECTION OF THE PROSTATE (TURP);  Surgeon: Adonis Brook, MD;  Location: [facility] ORS;  Service: Urology;  Laterality: N/A;  90 MINS FOR CASE    Allergies  Allergen Reactions   Penicillins Other (See Comments)    Unsure of reaction, had allergic reaction as a child and has not taken since then    Review of Systems: Negative except as noted in the HPI.  Objective:  [name] is a pleasant 72 y.o. male in NAD. AAO x 3.  Vascular Examination: Capillary refill time is 3-5 seconds to toes bilateral.  Palpable pedal pulses b/l LE. Digital hair present b/l.  Skin temperature gradient WNL b/l. No varicosities b/l. No cyanosis noted b/l.   Dermatological Examination: Pedal skin with normal turgor, texture and tone b/l. No open wounds. No interdigital macerations b/l. Toenails x10 are 3mm thick, discolored, dystrophic with subungual debris. There is pain with compression of the nail plates.  They are elongated x10.  Right great toenail has some dried blood present on the distal portion of the nailbed but otherwise no surrounding erythema or any signs of infection are noted.  The remaining nail is well adhered to the nailbed.  Skin is dry bilateral feet.      Latest Ref Rng & Units 12/17/2022    3:05 AM  Hemoglobin A1C  Hemoglobin-A1c 4.8 - 5.6 % 5.9     Assessment/Plan: 1. Pain due to onychomycosis of toenails of both feet   2. Xerosis of skin    The mycotic toenails were sharply debrided x10 with sterile nail nippers and a power debriding burr to decrease bulk/thickness and length.    Recommend daily moisturizing of feet.   Return in about 3 months (around 10/03/2023) for RFC.   Clerance Lav, DPM, FACFAS [facility]     [address] [place], [place] [address]                Office [phone]  Fax [phone]

## 2023-10-04 ENCOUNTER — Encounter: Payer: Medicare Other | Admitting: Podiatry

## 2023-10-04 NOTE — Progress Notes (Signed)
Patient did not show for scheduled appointment today.

## 2023-11-11 ENCOUNTER — Ambulatory Visit: Payer: Medicare Other | Admitting: Nurse Practitioner

## 2024-03-16 ENCOUNTER — Other Ambulatory Visit: Payer: Self-pay
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
## 2024-06-28 ENCOUNTER — Ambulatory Visit: Attending: Physician Assistant | Admitting: Physician Assistant

## 2024-06-28 VITALS — BP 120/62 | HR 87 | Ht 74.0 in | Wt 183.0 lb

## 2024-06-28 DIAGNOSIS — E782 Mixed hyperlipidemia: Secondary | ICD-10-CM | POA: Insufficient documentation

## 2024-06-28 DIAGNOSIS — I700 Atherosclerosis of aorta: Secondary | ICD-10-CM | POA: Diagnosis not present

## 2024-06-28 DIAGNOSIS — I10 Essential (primary) hypertension: Secondary | ICD-10-CM | POA: Diagnosis present

## 2024-06-28 DIAGNOSIS — Z720 Tobacco use: Secondary | ICD-10-CM | POA: Insufficient documentation

## 2024-06-28 DIAGNOSIS — J42 Unspecified chronic bronchitis: Secondary | ICD-10-CM | POA: Insufficient documentation

## 2024-06-28 MED ORDER — METOPROLOL SUCCINATE ER 25 MG PO TB24: 25.0000 mg | ORAL_TABLET | Freq: Every day | ORAL | 3 refills | Status: AC

## 2024-06-28 MED ORDER — METOPROLOL SUCCINATE ER 25 MG PO TB24: 25.0000 mg | ORAL_TABLET | Freq: Every day | ORAL | 3 refills | Status: DC

## 2024-06-28 NOTE — Patient Instructions (Addendum)
 Medication Instructions:  - STOP metoprolol  tartrate (Lopressor ) - START metoprolol  succinate (Toprol -XL) 25 mg once daily. A printed prescription has been provided for you.  *If you need a refill on your cardiac medications before your next appointment, please call your pharmacy*  Lab Work: - Fasting lipid panel next week  If you have labs (blood work) drawn today and your tests are completely normal, you will receive your results only by: MyChart Message (if you have MyChart) OR A paper copy in the mail If you have any lab test that is abnormal or we need to change your treatment, we will call you to review the results.  Follow-Up: At [facility], you and your health needs are our priority.  As part of our continuing mission to provide you with exceptional heart care, our providers are all part of one team.  This team includes your primary Cardiologist (physician) and Advanced Practice Providers or APPs (Physician Assistants and Nurse Practitioners) who all work together to provide you with the care you need, when you need it.  Your next appointment:   1 year(s)  Provider:   Stanly DELENA Leavens, MD or Orren Fabry, PA-C    We recommend signing up for the patient portal called MyChart.  Sign up information is provided on this After Visit Summary.  MyChart is used to connect with patients for Virtual Visits (Telemedicine).  Patients are able to view lab/test results, encounter notes, upcoming appointments, etc.  Non-urgent messages can be sent to your provider as well.   To learn more about what you can do with MyChart, go to [email or web address].

## 2024-07-03 ENCOUNTER — Telehealth: Payer: Self-pay | Admitting: Licensed Clinical Social Worker

## 2024-07-03 NOTE — Telephone Encounter (Signed)
 H&V Care Navigation CSW Progress Note  Clinical Social Worker contacted patient by phone to f/u on concerns reported/noted by provider. No answer at cell number on file, work number is [facility] and extension noted is transportation. I called [facility] today, left vm for social services director Nia Claudene requesting to speak with pt. Will re-attempt again as able.  Patient is participating in a Managed Medicaid Plan:  No, Medicare and [employer] Medicaid  SDOH Screenings   Food Insecurity: No Food Insecurity (06/24/2023)  Housing: Low Risk  (06/24/2023)  Transportation Needs: No Transportation Needs (06/24/2023)  Utilities: Not At Risk (06/24/2023)  Alcohol Screen: Low Risk  (03/20/2022)  Depression (PHQ2-9): Low Risk  (08/27/2022)  Recent Concern: Depression (PHQ2-9) - Medium Risk (06/01/2022)  Financial Resource Strain: High Risk (12/25/2022)   Received from [facility]  Physical Activity: Sufficiently Active (03/20/2022)  Social Connections: Socially Isolated (12/25/2022)   Received from [facility]  Stress: No Stress Concern Present (01/15/2023)   Received from [facility]  Tobacco Use: High Risk (07/05/2023)    Marit Lark, MSW, LCSW Clinical Social Worker II [facility] Heart/Vascular Care Navigation  [phone]- work cell phone (preferred)

## 2024-07-04 ENCOUNTER — Telehealth: Payer: Self-pay | Admitting: *Deleted
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
## 2024-07-11 ENCOUNTER — Telehealth: Payer: Self-pay | Admitting: Licensed Clinical Social Worker

## 2024-07-11 NOTE — Telephone Encounter (Signed)
 H&V Care Navigation CSW Progress Note  Clinical Social Worker contacted SNF Director of Nursing ([facility]) to see if we could connect with pt on any needs he may have for clothing/hygiene supplies as concerns noted by staff during visit. No answer, left voicemail.  Patient is participating in a Managed Medicaid Plan:  No, Medicare and Medicaid  SDOH Screenings   Food Insecurity: No Food Insecurity (06/24/2023)  Housing: Low Risk  (06/24/2023)  Transportation Needs: No Transportation Needs (06/24/2023)  Utilities: Not At Risk (06/24/2023)  Alcohol Screen: Low Risk  (03/20/2022)  Depression (PHQ2-9): Low Risk  (08/27/2022)  Recent Concern: Depression (PHQ2-9) - Medium Risk (06/01/2022)  Financial Resource Strain: High Risk (12/25/2022)   Received from [facility]  Physical Activity: Sufficiently Active (03/20/2022)  Social Connections: Socially Isolated (12/25/2022)   Received from [facility]  Stress: No Stress Concern Present (01/15/2023)   Received from [facility]  Tobacco Use: High Risk (07/05/2023)    Marit Lark, MSW, LCSW Clinical Social Worker II [facility] Heart/Vascular Care Navigation  [phone]- work cell phone (preferred)

## 2024-07-17 NOTE — Telephone Encounter (Signed)
 H&V Care Navigation CSW Progress Note  Clinical Social Worker contacted SNF Director of Nursing ([facility]) again to see if we could connect with pt on any needs he may have for clothing/hygiene supplies as concerns noted by staff during visit. Had received voicemail while out of the office from RN leadership but when I called again today no answer, left voicemail.   Patient is participating in a Managed Medicaid Plan:  No, Medicare and Medicaid  SDOH Screenings   Food Insecurity: No Food Insecurity (06/24/2023)  Housing: Low Risk (06/24/2023)  Transportation Needs: No Transportation Needs (06/24/2023)  Utilities: Not At Risk (06/24/2023)  Alcohol Screen: Low Risk (03/20/2022)  Depression (PHQ2-9): Low Risk (08/27/2022)  Recent Concern: Depression (PHQ2-9) - Medium Risk (06/01/2022)  Financial Resource Strain: High Risk (12/25/2022)   Received from [facility]  Physical Activity: Sufficiently Active (03/20/2022)  Social Connections: Socially Isolated (12/25/2022)   Received from [facility]  Stress: No Stress Concern Present (01/15/2023)   Received from [facility]  Tobacco Use: High Risk (07/05/2023)    Marit Lark, MSW, LCSW Clinical Social Worker II [facility] Heart/Vascular Care Navigation  [phone]- work cell phone (preferred)

## 2024-08-14 NOTE — Telephone Encounter (Signed)
 H&V Care Navigation CSW Progress Note  Clinical Social Worker connected with child psychotherapist at [facility] to provide pt with new clothing and my card. Remain available as needed.  Patient is participating in a Managed Medicaid Plan:  No, Medicare and   SDOH Screenings   Food Insecurity: No Food Insecurity (06/24/2023)  Housing: Low Risk (06/24/2023)  Transportation Needs: No Transportation Needs (06/24/2023)  Utilities: Not At Risk (06/24/2023)  Alcohol Screen: Low Risk (03/20/2022)  Depression (PHQ2-9): Low Risk (08/27/2022)  Recent Concern: Depression (PHQ2-9) - Medium Risk (06/01/2022)  Financial Resource Strain: High Risk (12/25/2022)   Received from [facility]  Physical Activity: Sufficiently Active (03/20/2022)  Social Connections: Socially Isolated (12/25/2022)   Received from [facility]  Stress: No Stress Concern Present (01/15/2023)   Received from [facility]  Tobacco Use: High Risk (07/05/2023)    Marit Lark, MSW, LCSW Clinical Social Worker II [facility] Heart/Vascular Care Navigation  [phone]- work cell phone (preferred)

## 2024-08-22 ENCOUNTER — Other Ambulatory Visit: Payer: Self-pay | Admitting: Urology

## 2024-09-28 ENCOUNTER — Ambulatory Visit (HOSPITAL_COMMUNITY): Admit: 2024-09-28 | Admitting: Urology
